# Patient Record
Sex: Female | Born: 1945 | Race: White | Hispanic: No | Smoking: Former smoker
Health system: Southern US, Community
[De-identification: ages and names within clinical notes are randomized; demographics above are authoritative.]

## PROBLEM LIST (undated history)

## (undated) DIAGNOSIS — F329 Major depressive disorder, single episode, unspecified: Secondary | ICD-10-CM

## (undated) DIAGNOSIS — I6523 Occlusion and stenosis of bilateral carotid arteries: Secondary | ICD-10-CM

## (undated) DIAGNOSIS — J189 Pneumonia, unspecified organism: Secondary | ICD-10-CM

## (undated) DIAGNOSIS — R0609 Other forms of dyspnea: Secondary | ICD-10-CM

## (undated) DIAGNOSIS — F32A Depression, unspecified: Secondary | ICD-10-CM

## (undated) DIAGNOSIS — E119 Type 2 diabetes mellitus without complications: Secondary | ICD-10-CM

## (undated) DIAGNOSIS — F419 Anxiety disorder, unspecified: Secondary | ICD-10-CM

## (undated) DIAGNOSIS — D509 Iron deficiency anemia, unspecified: Secondary | ICD-10-CM

## (undated) DIAGNOSIS — Z85828 Personal history of other malignant neoplasm of skin: Secondary | ICD-10-CM

## (undated) DIAGNOSIS — I10 Essential (primary) hypertension: Secondary | ICD-10-CM

## (undated) DIAGNOSIS — K219 Gastro-esophageal reflux disease without esophagitis: Secondary | ICD-10-CM

## (undated) DIAGNOSIS — M1990 Unspecified osteoarthritis, unspecified site: Secondary | ICD-10-CM

## (undated) DIAGNOSIS — D500 Iron deficiency anemia secondary to blood loss (chronic): Secondary | ICD-10-CM

## (undated) DIAGNOSIS — E785 Hyperlipidemia, unspecified: Secondary | ICD-10-CM

## (undated) DIAGNOSIS — R197 Diarrhea, unspecified: Secondary | ICD-10-CM

## (undated) DIAGNOSIS — M6530 Trigger finger, unspecified finger: Secondary | ICD-10-CM

## (undated) DIAGNOSIS — Z9889 Other specified postprocedural states: Secondary | ICD-10-CM

## (undated) DIAGNOSIS — R3915 Urgency of urination: Secondary | ICD-10-CM

## (undated) DIAGNOSIS — R0600 Dyspnea, unspecified: Secondary | ICD-10-CM

## (undated) DIAGNOSIS — I739 Peripheral vascular disease, unspecified: Secondary | ICD-10-CM

## (undated) DIAGNOSIS — R51 Headache: Secondary | ICD-10-CM

## (undated) DIAGNOSIS — R112 Nausea with vomiting, unspecified: Secondary | ICD-10-CM

## (undated) DIAGNOSIS — R0989 Other specified symptoms and signs involving the circulatory and respiratory systems: Secondary | ICD-10-CM

## (undated) DIAGNOSIS — M8580 Other specified disorders of bone density and structure, unspecified site: Secondary | ICD-10-CM

## (undated) DIAGNOSIS — Z853 Personal history of malignant neoplasm of breast: Secondary | ICD-10-CM

## (undated) DIAGNOSIS — I6529 Occlusion and stenosis of unspecified carotid artery: Secondary | ICD-10-CM

## (undated) DIAGNOSIS — C50919 Malignant neoplasm of unspecified site of unspecified female breast: Secondary | ICD-10-CM

## (undated) DIAGNOSIS — N183 Chronic kidney disease, stage 3 unspecified: Secondary | ICD-10-CM

## (undated) DIAGNOSIS — C801 Malignant (primary) neoplasm, unspecified: Secondary | ICD-10-CM

## (undated) DIAGNOSIS — M7522 Bicipital tendinitis, left shoulder: Secondary | ICD-10-CM

## (undated) DIAGNOSIS — I2510 Atherosclerotic heart disease of native coronary artery without angina pectoris: Secondary | ICD-10-CM

## (undated) HISTORY — DX: Malignant (primary) neoplasm, unspecified: C80.1

## (undated) HISTORY — PX: COLONOSCOPY: SHX174

## (undated) HISTORY — DX: Trigger finger, unspecified finger: M65.30

## (undated) HISTORY — PX: TUBAL LIGATION: SHX77

## (undated) HISTORY — DX: Other specified disorders of bone density and structure, unspecified site: M85.80

## (undated) HISTORY — PX: BREAST EXCISIONAL BIOPSY: SUR124

## (undated) HISTORY — DX: Chronic kidney disease, stage 3 unspecified: N18.30

## (undated) HISTORY — DX: Malignant neoplasm of unspecified site of unspecified female breast: C50.919

## (undated) HISTORY — DX: Type 2 diabetes mellitus without complications: E11.9

## (undated) HISTORY — DX: Iron deficiency anemia secondary to blood loss (chronic): D50.0

## (undated) HISTORY — DX: Hyperlipidemia, unspecified: E78.5

## (undated) HISTORY — DX: Occlusion and stenosis of unspecified carotid artery: I65.29

## (undated) HISTORY — DX: Other specified symptoms and signs involving the circulatory and respiratory systems: R09.89

## (undated) HISTORY — DX: Chronic kidney disease, stage 3 (moderate): N18.3

## (undated) HISTORY — PX: APPENDECTOMY: SHX54

## (undated) HISTORY — DX: Iron deficiency anemia, unspecified: D50.9

## (undated) HISTORY — PX: WISDOM TOOTH EXTRACTION: SHX21

## (undated) HISTORY — DX: Essential (primary) hypertension: I10

---

## 1980-07-20 DIAGNOSIS — Z85820 Personal history of malignant melanoma of skin: Secondary | ICD-10-CM

## 1980-07-20 HISTORY — DX: Personal history of malignant melanoma of skin: Z85.820

## 1980-07-31 DIAGNOSIS — C4372 Malignant melanoma of left lower limb, including hip: Secondary | ICD-10-CM | POA: Insufficient documentation

## 1980-12-18 HISTORY — PX: MELANOMA EXCISION: SHX5266

## 1981-05-20 HISTORY — PX: BLADDER SUSPENSION: SHX72

## 1981-05-20 HISTORY — PX: BLADDER SURGERY: SHX569

## 1983-07-21 HISTORY — PX: LAPAROSCOPIC CHOLECYSTECTOMY: SUR755

## 1993-07-20 HISTORY — PX: CHOLECYSTECTOMY: SHX55

## 1999-08-14 ENCOUNTER — Other Ambulatory Visit: Admission: RE | Admit: 1999-08-14 | Discharge: 1999-08-14 | Payer: Self-pay | Admitting: Obstetrics and Gynecology

## 1999-08-14 ENCOUNTER — Encounter (INDEPENDENT_AMBULATORY_CARE_PROVIDER_SITE_OTHER): Payer: Self-pay

## 2002-06-04 ENCOUNTER — Encounter: Payer: Self-pay | Admitting: Emergency Medicine

## 2002-06-04 ENCOUNTER — Inpatient Hospital Stay (HOSPITAL_COMMUNITY): Admission: EM | Admit: 2002-06-04 | Discharge: 2002-06-05 | Payer: Self-pay | Admitting: Emergency Medicine

## 2004-08-13 ENCOUNTER — Ambulatory Visit (HOSPITAL_COMMUNITY): Admission: RE | Admit: 2004-08-13 | Discharge: 2004-08-13 | Payer: Self-pay | Admitting: Gastroenterology

## 2008-05-28 ENCOUNTER — Encounter: Admission: RE | Admit: 2008-05-28 | Discharge: 2008-05-28 | Payer: Self-pay | Admitting: Gastroenterology

## 2010-07-20 HISTORY — PX: SQUAMOUS CELL CARCINOMA EXCISION: SHX2433

## 2010-12-05 NOTE — Discharge Summary (Signed)
   NAMESUNDUS, PETE NO.:  [id number]   MEDICAL RECORD NO.:  [id number]                   PATIENT TYPE:  INP   LOCATION:  2031                                 FACILITY:  [facility]   PHYSICIAN:  Lazaro Arms, M.D.        DATE OF BIRTH:  04-Oct-1945   DATE OF ADMISSION:  06/04/2002  DATE OF DISCHARGE:  06/05/2002                                 DISCHARGE SUMMARY   DISCHARGE DIAGNOSES:  1. Atypical chest pain.  2. Diet controlled diabetes.  3. Borderline hyperlipidemia.  4. Borderline hypertension.   HISTORY OF PRESENT ILLNESS:  Patient came to the hospital with some severe  chest pressure with a funny ache in her left arm.  She had no nausea or  diaphoresis.  The pain was not related to activity so she came to the  emergency room for evaluation.  The patient was admitted, placed on  telemetry, and ruled out for myocardial infarction.  She remained  asymptomatic throughout her hospital stay.  She was discharged home in  stable condition pain free on June 04, 2002.   PHYSICAL EXAMINATION:  Her blood pressure was 120/60.  Her pulse was 80.  O2  sat  was 98% on room air.  Temperature was 98.7.  The exam was within normal  limits.   DISCHARGE PLAN:  She is to followup next week for a basic metabolic profile  and a blood pressure check at Dr. Norvel Richards office.  We will set up an  outpatient stress test.   DISCHARGE MEDICATIONS:  Aspirin 325 mg p.o. daily.   She was also told to follow up immediately if she had any recurrence of the  chest pain.                                               Lazaro Arms, M.D.    AMC/MEDQ  D:  07/30/2002  T:  07/30/2002  Job:  [id number]   cc:   Rosanne Gutting  [address]  [place]  [place] [address]  Fax: [phone]

## 2010-12-05 NOTE — Op Note (Signed)
Faith Gaines, TETRO NO.:  [id number]   MEDICAL RECORD NO.:  [id number]          PATIENT TYPE:  AMB   LOCATION:  ENDO                         FACILITY:  [facility]   PHYSICIAN:  Graylin Shiver, M.D.   DATE OF BIRTH:  Mar 03, 1946   DATE OF PROCEDURE:  08/13/2004  DATE OF DISCHARGE:                                 OPERATIVE REPORT   PROCEDURE PERFORMED:  Colonoscopy.   INDICATIONS:  Rectal bleeding.   Informed consent was obtained after explanation of the risks of bleeding,  infection and perforation.   PREMEDICATION:  Fentanyl 75 mcg IV, Versed 5 milligrams IV.   PROCEDURE:  With the patient in the left lateral decubitus position. A  rectal exam was performed. No masses were felt. No external lesions were  seen. The Olympus colonoscope was inserted into the rectum and advanced  around the colon to the cecum. Cecal landmarks were identified. The cecum  and ascending colon were normal. The transverse colon normal. The descending  colon, sigmoid and rectum were normal. The scope was retroflexed in the  rectum. No abnormalities were seen. The scope was straightened and brought  out. She tolerated the procedure well without complications.   IMPRESSION:  Normal colonoscopy to the cecum.      SFG/MEDQ  D:  08/13/2004  T:  08/13/2004  Job:  [id number]   cc:   Molly Maduro A. Nicholos Johns, M.D.  [address]  [place]  [place] [address]  Fax: [phone]

## 2013-05-23 ENCOUNTER — Other Ambulatory Visit: Payer: Self-pay | Admitting: Obstetrics and Gynecology

## 2013-05-23 DIAGNOSIS — R922 Inconclusive mammogram: Secondary | ICD-10-CM

## 2013-06-02 ENCOUNTER — Ambulatory Visit
Admission: RE | Admit: 2013-06-02 | Discharge: 2013-06-02 | Disposition: A | Discharge status: Home / Self Care | Payer: Medicare Other | Source: Ambulatory Visit | Attending: Obstetrics and Gynecology | Admitting: Obstetrics and Gynecology

## 2013-06-02 DIAGNOSIS — N63 Unspecified lump in unspecified breast: Secondary | ICD-10-CM | POA: Diagnosis not present

## 2013-06-02 DIAGNOSIS — R922 Inconclusive mammogram: Secondary | ICD-10-CM

## 2013-06-02 DIAGNOSIS — R9230 Dense breasts, unspecified: Secondary | ICD-10-CM

## 2013-06-02 MED ORDER — GADOBENATE DIMEGLUMINE 529 MG/ML IV SOLN
10.0000 mL | Freq: Once | INTRAVENOUS | Status: AC | PRN
  Administered 2013-06-02: 10 mL via INTRAVENOUS

## 2013-06-05 ENCOUNTER — Other Ambulatory Visit: Payer: Self-pay | Admitting: Obstetrics and Gynecology

## 2013-06-05 ENCOUNTER — Other Ambulatory Visit: Payer: Self-pay | Admitting: Family Medicine

## 2013-06-05 DIAGNOSIS — Z23 Encounter for immunization: Secondary | ICD-10-CM | POA: Diagnosis not present

## 2013-06-05 DIAGNOSIS — Z0000 Encounter for general adult medical examination without abnormal findings: Secondary | ICD-10-CM | POA: Diagnosis not present

## 2013-06-05 DIAGNOSIS — R197 Diarrhea, unspecified: Secondary | ICD-10-CM | POA: Diagnosis not present

## 2013-06-05 DIAGNOSIS — I10 Essential (primary) hypertension: Secondary | ICD-10-CM | POA: Diagnosis not present

## 2013-06-05 DIAGNOSIS — N183 Chronic kidney disease, stage 3 unspecified: Secondary | ICD-10-CM | POA: Diagnosis not present

## 2013-06-05 DIAGNOSIS — E785 Hyperlipidemia, unspecified: Secondary | ICD-10-CM | POA: Diagnosis not present

## 2013-06-05 DIAGNOSIS — R9389 Abnormal findings on diagnostic imaging of other specified body structures: Secondary | ICD-10-CM

## 2013-06-05 DIAGNOSIS — R0989 Other specified symptoms and signs involving the circulatory and respiratory systems: Secondary | ICD-10-CM

## 2013-06-05 DIAGNOSIS — E1129 Type 2 diabetes mellitus with other diabetic kidney complication: Secondary | ICD-10-CM | POA: Diagnosis not present

## 2013-06-06 ENCOUNTER — Other Ambulatory Visit: Payer: Self-pay | Admitting: Obstetrics and Gynecology

## 2013-06-06 ENCOUNTER — Ambulatory Visit
Admission: RE | Admit: 2013-06-06 | Discharge: 2013-06-06 | Disposition: A | Discharge status: Home / Self Care | Payer: Medicare Other | Source: Ambulatory Visit | Attending: Obstetrics and Gynecology | Admitting: Obstetrics and Gynecology

## 2013-06-06 DIAGNOSIS — N63 Unspecified lump in unspecified breast: Secondary | ICD-10-CM | POA: Diagnosis not present

## 2013-06-06 DIAGNOSIS — R9389 Abnormal findings on diagnostic imaging of other specified body structures: Secondary | ICD-10-CM

## 2013-06-06 DIAGNOSIS — R1031 Right lower quadrant pain: Secondary | ICD-10-CM | POA: Diagnosis not present

## 2013-06-07 ENCOUNTER — Ambulatory Visit
Admission: RE | Admit: 2013-06-07 | Discharge: 2013-06-07 | Disposition: A | Discharge status: Home / Self Care | Payer: Medicare Other | Source: Ambulatory Visit | Attending: Family Medicine | Admitting: Family Medicine

## 2013-06-07 DIAGNOSIS — R0989 Other specified symptoms and signs involving the circulatory and respiratory systems: Secondary | ICD-10-CM

## 2013-06-07 DIAGNOSIS — I658 Occlusion and stenosis of other precerebral arteries: Secondary | ICD-10-CM | POA: Diagnosis not present

## 2013-06-13 DIAGNOSIS — R141 Gas pain: Secondary | ICD-10-CM | POA: Diagnosis not present

## 2013-06-13 DIAGNOSIS — R197 Diarrhea, unspecified: Secondary | ICD-10-CM | POA: Diagnosis not present

## 2013-06-13 DIAGNOSIS — K625 Hemorrhage of anus and rectum: Secondary | ICD-10-CM | POA: Diagnosis not present

## 2013-06-13 DIAGNOSIS — R1031 Right lower quadrant pain: Secondary | ICD-10-CM | POA: Diagnosis not present

## 2013-06-14 ENCOUNTER — Ambulatory Visit
Admission: RE | Admit: 2013-06-14 | Discharge: 2013-06-14 | Disposition: A | Discharge status: Home / Self Care | Payer: Medicare Other | Source: Ambulatory Visit | Attending: Obstetrics and Gynecology | Admitting: Obstetrics and Gynecology

## 2013-06-14 DIAGNOSIS — N6089 Other benign mammary dysplasias of unspecified breast: Secondary | ICD-10-CM | POA: Diagnosis not present

## 2013-06-14 DIAGNOSIS — N63 Unspecified lump in unspecified breast: Secondary | ICD-10-CM

## 2013-06-14 DIAGNOSIS — D0590 Unspecified type of carcinoma in situ of unspecified breast: Secondary | ICD-10-CM | POA: Diagnosis not present

## 2013-06-14 DIAGNOSIS — R1031 Right lower quadrant pain: Secondary | ICD-10-CM | POA: Diagnosis not present

## 2013-06-14 DIAGNOSIS — R197 Diarrhea, unspecified: Secondary | ICD-10-CM | POA: Diagnosis not present

## 2013-06-14 DIAGNOSIS — R9389 Abnormal findings on diagnostic imaging of other specified body structures: Secondary | ICD-10-CM

## 2013-06-14 DIAGNOSIS — N6019 Diffuse cystic mastopathy of unspecified breast: Secondary | ICD-10-CM | POA: Diagnosis not present

## 2013-06-14 DIAGNOSIS — K625 Hemorrhage of anus and rectum: Secondary | ICD-10-CM | POA: Diagnosis not present

## 2013-06-14 DIAGNOSIS — C50919 Malignant neoplasm of unspecified site of unspecified female breast: Secondary | ICD-10-CM | POA: Diagnosis not present

## 2013-06-14 DIAGNOSIS — R141 Gas pain: Secondary | ICD-10-CM | POA: Diagnosis not present

## 2013-06-14 MED ORDER — GADOBENATE DIMEGLUMINE 529 MG/ML IV SOLN
10.0000 mL | Freq: Once | INTRAVENOUS | Status: AC | PRN
  Administered 2013-06-14: 10 mL via INTRAVENOUS

## 2013-06-20 ENCOUNTER — Telehealth: Payer: Self-pay | Admitting: *Deleted

## 2013-06-20 NOTE — Telephone Encounter (Signed)
Pt has decided to have Dr. Donell Beers perform her surgery and bypass BMDC on 06/28/13.  She would like the remainder of her treatment to be performed in [place].  I have conveyed this to Dr. Donell Beers and her nurse.  Gave pt my contact information if she has needs.

## 2013-06-22 ENCOUNTER — Other Ambulatory Visit: Payer: Self-pay | Admitting: Vascular Surgery

## 2013-06-23 ENCOUNTER — Ambulatory Visit (INDEPENDENT_AMBULATORY_CARE_PROVIDER_SITE_OTHER): Payer: Medicare Other | Admitting: General Surgery

## 2013-06-23 ENCOUNTER — Encounter (INDEPENDENT_AMBULATORY_CARE_PROVIDER_SITE_OTHER): Payer: Self-pay

## 2013-06-23 ENCOUNTER — Other Ambulatory Visit (INDEPENDENT_AMBULATORY_CARE_PROVIDER_SITE_OTHER): Payer: Self-pay | Admitting: General Surgery

## 2013-06-23 ENCOUNTER — Encounter (INDEPENDENT_AMBULATORY_CARE_PROVIDER_SITE_OTHER): Payer: Self-pay | Admitting: General Surgery

## 2013-06-23 VITALS — BP 118/78 | HR 64 | Temp 98.6°F | Resp 14 | Ht 61.0 in | Wt 125.4 lb

## 2013-06-23 DIAGNOSIS — C50419 Malignant neoplasm of upper-outer quadrant of unspecified female breast: Secondary | ICD-10-CM | POA: Diagnosis not present

## 2013-06-23 DIAGNOSIS — Z85820 Personal history of malignant melanoma of skin: Secondary | ICD-10-CM

## 2013-06-23 DIAGNOSIS — C50412 Malignant neoplasm of upper-outer quadrant of left female breast: Secondary | ICD-10-CM | POA: Insufficient documentation

## 2013-06-23 DIAGNOSIS — C50912 Malignant neoplasm of unspecified site of left female breast: Secondary | ICD-10-CM

## 2013-06-23 NOTE — Patient Instructions (Signed)
I will send my note to Dr. Hart Rochester.    Once I hear from him, we will determine timing of surgery.    At this point, continue your fish oil and aspirin until otherwise directed.   IF YOU ARE TAKING ASPIRIN, COUMADIN/WARFARIN, PLAVIX, OR OTHER BLOOD THINNER, PLEASE LET us KNOW IMMEDIATELY.  WE WILL NEED TO DISCUSS WITH THE PRESCRIBING PROVIDER IF THESE ARE SAFE TO STOP. IF THESE ARE NOT STOPPED AT THE APPROPRIATE TIME, THIS WILL RESULT IN A DELAY FOR YOUR SURGERY.  DO NOT TAKE THESE MEDICATIONS OR IBUPROFEN/NAPROXEN WITHIN A WEEK BEFORE SURGERY.  You will need to get a wire placed in radiology prior to surgery.  We will schedule that at time of surgery.    The main risks of surgery are bleeding, infection, damage to other structures, and seroma (accumulation of fluid) under the incision site(s).    These complications may lead to additional procedures such as drainage of seroma/infection.  If invasive cancer is found, you may need other surgeries to obtain negative margins or to take more lymph nodes.   Most women do accumulate fluid in the breast cavity where the specimen was removed. We do not always have to drain this fluid.  If your breast is very tense, painful, or red, then we may need to numb the skin and use a needle to aspirate the fluid.  We do provide patients with a Breast Binder.  The purpose of this is to avoid the use of tape on the sensitive tissue of the breast and to provide some compression to minimize the risk of seroma.  If the binder is uncomfortable, you may find that a tank top with a built-in shelf bra or a loose sports bra works better for you.  I recommend wearing this around the clock for the first 1-2 weeks except in the shower.    You may remove your dressings and may shower 48 hours after surgery.   Many patients have some constipation in the week after surgery due to the narcotics and anesthesia.  You may need over the counter stool softeners or laxatives if you  experience difficulty having bowel movements.    If the following occur, call our office at [phone]: If you have a fever over 101 or pain that is severe despite narcotics. If you have redness or drainage at the wound. If you develop persistent nausea or vomiting.  I will follow you back up in 1-4 weeks.    Please submit any paperwork about time off work/insurance forms to the front desk.

## 2013-06-23 NOTE — Assessment & Plan Note (Signed)
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 She is a good candidate for breast conservation.  She will need a left needle localized partial mastectomy. She just recently underwent a carotid duplex and was found to have right-sided carotid stenosis. She has an appointment with Dr. Hart Rochester of vascular surgery on Tuesday to discuss this. She was instructed by the person reading her vascular ultrasound that she should have that dealt with first.  I certainly would want to know Dr. Candie Chroman opinion prior to inserting her to hold her fish oil and her aspirin. If she indeed has critical carotid stenosis that needs to be addressed surgically, I would agree with undergoing carotid treatment prior to breast cancer therapy.  I would not have her undergo a lumpectomy and carotid endarterectomy simultaneously due to the administration of blood thinners intraoperatively for carotid surgery.  That would place her at higher risk of bleeding from the left breast.  Once I understand his plan, we will schedule her breast surgery.  The surgical procedure was described to the patient.  I discussed the incision type and location and whether we would need radiology involved on the day of surgery with a wire marker.      The risks and benefits of the procedure were described to the patient and he/she wishes to proceed.    We discussed the risks bleeding, infection, damage to other structures, need for further procedures/surgeries.  We discussed the risk of seroma.  The patient was advised if the area in the breast contains invasive cancer, we may need to go back to surgery for additional tissue to obtain negative margins or for a lymph node biopsy. The patient was advised that these are the most common complications, but that others can occur as well.  They were advised against taking aspirin or other anti-inflammatory agents/blood thinners the week before surgery.

## 2013-06-23 NOTE — Progress Notes (Signed)
Chief Complaint  Patient presents with  . New Evaluation    eval new br cancer    HISTORY: Patient is a 67 year old female who presents with a new diagnosis of left breast DCIS. She had her normal screening mammograms and received a letter advising her that she had very dense breasts. She subsequently underwent MRI and was found to have a 5 mm area of concern in her right breast and a 7 mm area of concern in her left breast. She subsequently underwent ultrasound without being able to successfully biopsy the mass. 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 She has not palpated any masses. She denies any nipple discharge or nipple retraction. She has not had any breast pain other than after the right-sided biopsy with hematoma.  She does have a significant family history of her mother having bilateral breast cancer as well as uterine cancer. The patient has a personal history of melanoma. She has required multiple pre-melanoma excisions from her dermatologist.  Of note, she was recently diagnosed with carotid stenosis on the right. I do not have these results available. However, she states that she has been told she had a 75-99% stenosis on the right.  She and does not have dizziness and has not had TIAs. Her mother did have a stroke and had a heart attack on the table during her second mastectomy. She does not have any dysfunctional uterine bleeding. She is a former smoker.  Past Medical History  Diagnosis Date  . Cancer     breast  . Diabetes mellitus without complication   . Hyperlipidemia   . Hypertension   . Chronic kidney disease     Past Surgical History  Procedure Laterality Date  . Melanoma excision  12/1980    left leg  . Bladder surgery  05/1981    bladder tack  . Cholecystectomy  1995  . Squamous cell carcinoma excision  2012    Current Outpatient Prescriptions  Medication Sig Dispense Refill  . Canagliflozin (INVOKANA)  300 MG TABS Take 300 mg by mouth.      . Fish Oil-Cholecalciferol (FISH OIL + D3) 1000-1000 MG-UNIT CAPS Take by mouth.      . nebivolol (BYSTOLIC) 10 MG tablet Take 10 mg by mouth daily.      . nebivolol (BYSTOLIC) 5 MG tablet Take 5 mg by mouth daily.      . pantoprazole (PROTONIX) 40 MG tablet Take 40 mg by mouth daily.      . Saxagliptin-Metformin (KOMBIGLYZE XR) 2.11-998 MG TB24 Take by mouth.       No current facility-administered medications for this visit.     Allergies  Allergen Reactions  . Codeine Nausea And Vomiting and Other (See Comments)    Headache and vomiting     Family History  Problem Relation Age of Onset  . Cancer Mother     breast, uterus, lung  . Heart disease Father      History   Social History  . Marital Status: Widowed    Spouse Name: N/A    Number of Children: N/A  . Years of Education: N/A   Social History Main Topics  . Smoking status: Former Smoker    Quit date: 07/20/2010  . Smokeless tobacco: Never Used  . Alcohol Use: No  . Drug Use: No  . Sexual Activity: None   REVIEW OF SYSTEMS - PERTINENT POSITIVES ONLY: 12 point review of systems negative other than  HPI and PMH except for diarrhea, weakness  EXAM: Filed Vitals:   06/23/13 1018  BP: 118/78  Pulse: 64  Temp: 98.6 F (37 C)  Resp: 14   Filed Weights   06/23/13 1018  Weight: 125 lb 6.4 oz (56.881 kg)    Gen:  No acute distress.  Well nourished and well groomed.   Neurological: Alert and oriented to person, place, and time. Coordination normal.  Head: Normocephalic and atraumatic.  Eyes: Conjunctivae are normal. Pupils are equal, round, and reactive to light. No scleral icterus.  Neck: Normal range of motion. Neck supple. No tracheal deviation or thyromegaly present.  Cardiovascular: Normal rate, regular rhythm, normal heart sounds and intact distal pulses.  Exam reveals no gallop and no friction rub.  No murmur heard. Respiratory: Effort normal.  No respiratory  distress. No chest wall tenderness. Breath sounds normal.  No wheezes, rales or rhonchi.  Breast:  Right breast with hematoma at 9:30.  Left breast soft.  No palpable masses or skin dimpling.  No nipple retraction.  No nipple discharge.  No LAD.   GI: Soft. Bowel sounds are normal. The abdomen is soft and nontender.  There is no rebound and no guarding.  Musculoskeletal: Normal range of motion. Extremities are nontender.  Lymphadenopathy: No cervical, preauricular, postauricular or axillary adenopathy is present Skin: Skin is warm and dry. No rash noted. No diaphoresis. No erythema. No pallor. No clubbing, cyanosis, or edema.   Psychiatric: Normal mood and affect. Behavior is normal. Judgment and thought content normal.    LABORATORY RESULTS: Available labs are reviewed  Pathology Diagnosis 1. Breast, right, needle core biopsy, upper outer quadrant - FIBROCYSTIC CHANGES WITH USUAL DUCTAL HYPERPLASIA. - BENIGN DUCTS WITH ASSOCIATED MICROCALCIFICATION. - NO ATYPIA OR MALIGNANCY IDENTIFIED. 2. Breast, left, needle core biopsy, mass, outer central - DUCTAL CARCINOMA IN SITU. - SEE COMMENT.  RADIOLOGY RESULTS: See E-Chart or I-Site for most recent results.  Images and reports are reviewed. MRI IMPRESSION:  1. An indeterminate 7 mm mass at 3 o'clock in the left breast.  2. A 5 mm dominant focus in the upper-outer right breast.    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 She is a good candidate for breast conservation.  She will need a left needle localized partial mastectomy. She just recently underwent a carotid duplex and was found to have right-sided carotid stenosis. She has an appointment with Dr. Hart Rochester of vascular surgery on Tuesday to discuss this. She was instructed by the person reading her vascular ultrasound that she should have that dealt with  first.  I certainly would want to know Dr. Candie Chroman opinion prior to inserting her to hold her fish oil and her aspirin. If she indeed has critical carotid stenosis that needs to be addressed surgically, I would agree with undergoing carotid treatment prior to breast cancer therapy.  I would not have her undergo a lumpectomy and carotid endarterectomy simultaneously due to the administration of blood thinners intraoperatively for carotid surgery.  That would place her at higher risk of bleeding from the left breast.  Once I understand his plan, we will schedule her breast surgery.  The surgical procedure was described to the patient.  I discussed the incision type and location and whether we would need radiology involved on the day of surgery with a wire marker.      The risks  and benefits of the procedure were described to the patient and he/she wishes to proceed.    We discussed the risks bleeding, infection, damage to other structures, need for further procedures/surgeries.  We discussed the risk of seroma.  The patient was advised if the area in the breast contains invasive cancer, we may need to go back to surgery for additional tissue to obtain negative margins or for a lymph node biopsy. The patient was advised that these are the most common complications, but that others can occur as well.  They were advised against taking aspirin or other anti-inflammatory agents/blood thinners the week before surgery.        Maudry Diego MD Surgical Oncology, General and Endocrine Surgery [facility]      Visit Diagnoses: 1. Breast cancer of upper-outer quadrant of left female breast, Tis, 3 oclock     Primary Care Physician: Lolita Patella, MD  [facility].

## 2013-06-26 ENCOUNTER — Encounter: Payer: Self-pay | Admitting: Vascular Surgery

## 2013-06-27 ENCOUNTER — Encounter: Payer: Self-pay | Admitting: *Deleted

## 2013-06-27 ENCOUNTER — Other Ambulatory Visit: Payer: Self-pay

## 2013-06-27 ENCOUNTER — Ambulatory Visit (INDEPENDENT_AMBULATORY_CARE_PROVIDER_SITE_OTHER)
Admission: RE | Admit: 2013-06-27 | Discharge: 2013-06-27 | Disposition: A | Discharge status: Home / Self Care | Payer: Medicare Other | Source: Ambulatory Visit | Attending: Vascular Surgery | Admitting: Vascular Surgery

## 2013-06-27 ENCOUNTER — Encounter: Payer: Self-pay | Admitting: Vascular Surgery

## 2013-06-27 ENCOUNTER — Ambulatory Visit (INDEPENDENT_AMBULATORY_CARE_PROVIDER_SITE_OTHER): Payer: Medicare Other | Admitting: Vascular Surgery

## 2013-06-27 DIAGNOSIS — I6529 Occlusion and stenosis of unspecified carotid artery: Secondary | ICD-10-CM | POA: Insufficient documentation

## 2013-06-27 NOTE — Progress Notes (Signed)
Subjective:     Patient ID: Faith Gaines, female   DOB: 11/15/1945, 67 y.o.   MRN: [id number]  HPI this 67-year-old female is referred for severe right ICA stenosis. She was found to have a carotid bruit and carotid duplex exam was performed which revealed a greater than 80% right ICA stenosis. Her physician Dr. Robert Reade referred her for evaluation. She has no history of stroke, lateralizing weakness, aphasia, amaurosis fugax, diplopia, blurred vision, or syncope. She does have a breast issue which will require surgery in the near future.  Past Medical History  Diagnosis Date  . Cancer     breast  . Diabetes mellitus without complication   . Hyperlipidemia   . Hypertension   . Chronic kidney disease   . Carotid bruit   . Trigger finger   . Osteopenia     History  Substance Use Topics  . Smoking status: Former Smoker    Quit date: 07/20/2010  . Smokeless tobacco: Never Used  . Alcohol Use: No    Family History  Problem Relation Age of Onset  . Cancer Mother     breast, uterus, lung  . Heart attack Mother   . Stroke Mother   . Heart disease Mother   . Hypertension Mother   . Varicose Veins Mother   . Heart disease Father   . Heart attack Father   . Kidney disease Father   . Hypertension Father   . Diabetes Father   . Cancer Father   . Heart attack Sister   . Heart disease Sister     Allergies  Allergen Reactions  . Codeine Nausea And Vomiting and Other (See Comments)    Headache and vomiting  . Ace Inhibitors     Drop in GFR  . Clarithromycin     Unable to sleep   . Crestor [Rosuvastatin]     Body aches  . Lipitor [Atorvastatin]     Body aches  . Avandamet [Rosiglitazone-Metformin] Rash    Current outpatient prescriptions:acetaminophen (TYLENOL) 500 MG tablet, Take 500 mg by mouth every 6 (six) hours as needed., Disp: , Rfl: ;  aspirin 81 MG tablet, Take 81 mg by mouth daily., Disp: , Rfl: ;  Canagliflozin (INVOKANA) 300 MG TABS, Take 300 mg by  mouth., Disp: , Rfl: ;  Fish Oil-Cholecalciferol (FISH OIL + D3) 1000-1000 MG-UNIT CAPS, Take by mouth., Disp: , Rfl:  hydrocortisone (ANUSOL-HC) 25 MG suppository, Place 25 mg rectally 2 (two) times daily., Disp: , Rfl: ;  nebivolol (BYSTOLIC) 10 MG tablet, Take 10 mg by mouth daily., Disp: , Rfl: ;  nebivolol (BYSTOLIC) 5 MG tablet, Take 5 mg by mouth daily., Disp: , Rfl: ;  pantoprazole (PROTONIX) 40 MG tablet, Take 40 mg by mouth daily., Disp: , Rfl: ;  Saxagliptin-Metformin (KOMBIGLYZE XR) 2.11-998 MG TB24, Take by mouth., Disp: , Rfl:  amLODipine (NORVASC) 5 MG tablet, Take 5 mg by mouth daily., Disp: , Rfl:   BP 165/57  Pulse 69  Ht 5' 1" (1.549 m)  Wt 126 lb 9.6 oz (57.425 kg)  BMI 23.93 kg/m2  SpO2 100%  Body mass index is 23.93 kg/(m^2).          Review of Systems denies chest pain, dyspnea on exertion, PND, orthopnea, claudication, hemoptysis. Does complain of generalized weakness in her lower extremities washed out feeling on occasion. All other systems negative complete review of systems    Objective:   Physical Exam BP 165/57  Pulse 69    Ht 5' 1" (1.549 m)  Wt 126 lb 9.6 oz (57.425 kg)  BMI 23.93 kg/m2  SpO2 100%  Gen.-alert and oriented x3 in no apparent distress HEENT normal for age Lungs no rhonchi or wheezing Cardiovascular regular rhythm no murmurs carotid pulses 3+ palpable-harsh bruit over right carotid bifurcation-no bruit on the left  Abdomen soft nontender no palpable masses Musculoskeletal free of  major deformities Skin clear -no rashes Neurologic normal Lower extremities 3+ femoral and dorsalis pedis pulses palpable bilaterally with no edema  Today I ordered a carotid duplex exam of the right side only to compare to the outside exam. We are in agreement that she does have an approximate 80-85% right ICA stenosis with mild/moderate disease on the contralateral left side by the outside study.        Assessment:     80% right ICA  stenosis-asymptomatic    Plan:     Plan right carotid endarterectomy with Dacron patch angioplasty on Thursday, December 11-risks and benefits thoroughly discussed with patient and her 3 children and all are in agreement to proceed      

## 2013-06-28 ENCOUNTER — Encounter (HOSPITAL_COMMUNITY): Payer: Self-pay | Admitting: Pharmacy Technician

## 2013-06-28 ENCOUNTER — Other Ambulatory Visit (HOSPITAL_COMMUNITY): Payer: Self-pay | Admitting: *Deleted

## 2013-06-28 ENCOUNTER — Encounter (HOSPITAL_COMMUNITY)
Admission: RE | Admit: 2013-06-28 | Discharge: 2013-06-28 | Disposition: A | Discharge status: Home / Self Care | Payer: Medicare Other | Source: Ambulatory Visit | Attending: Anesthesiology | Admitting: Anesthesiology

## 2013-06-28 ENCOUNTER — Encounter (HOSPITAL_COMMUNITY)
Admission: RE | Admit: 2013-06-28 | Discharge: 2013-06-28 | Disposition: A | Discharge status: Home / Self Care | Payer: Medicare Other | Source: Ambulatory Visit | Attending: Vascular Surgery | Admitting: Vascular Surgery

## 2013-06-28 ENCOUNTER — Encounter (HOSPITAL_COMMUNITY): Payer: Self-pay

## 2013-06-28 DIAGNOSIS — M899 Disorder of bone, unspecified: Secondary | ICD-10-CM | POA: Diagnosis present

## 2013-06-28 DIAGNOSIS — M6530 Trigger finger, unspecified finger: Secondary | ICD-10-CM | POA: Diagnosis present

## 2013-06-28 DIAGNOSIS — I129 Hypertensive chronic kidney disease with stage 1 through stage 4 chronic kidney disease, or unspecified chronic kidney disease: Secondary | ICD-10-CM | POA: Diagnosis present

## 2013-06-28 DIAGNOSIS — Z87891 Personal history of nicotine dependence: Secondary | ICD-10-CM | POA: Diagnosis not present

## 2013-06-28 DIAGNOSIS — N189 Chronic kidney disease, unspecified: Secondary | ICD-10-CM | POA: Diagnosis not present

## 2013-06-28 DIAGNOSIS — E119 Type 2 diabetes mellitus without complications: Secondary | ICD-10-CM | POA: Diagnosis not present

## 2013-06-28 DIAGNOSIS — Z01818 Encounter for other preprocedural examination: Secondary | ICD-10-CM | POA: Diagnosis not present

## 2013-06-28 DIAGNOSIS — E785 Hyperlipidemia, unspecified: Secondary | ICD-10-CM | POA: Diagnosis not present

## 2013-06-28 DIAGNOSIS — J449 Chronic obstructive pulmonary disease, unspecified: Secondary | ICD-10-CM | POA: Diagnosis not present

## 2013-06-28 DIAGNOSIS — I6529 Occlusion and stenosis of unspecified carotid artery: Secondary | ICD-10-CM | POA: Diagnosis not present

## 2013-06-28 HISTORY — DX: Peripheral vascular disease, unspecified: I73.9

## 2013-06-28 HISTORY — DX: Gastro-esophageal reflux disease without esophagitis: K21.9

## 2013-06-28 HISTORY — DX: Pneumonia, unspecified organism: J18.9

## 2013-06-28 LAB — CBC
HCT: 40.5 % (ref 36.0–46.0)
Hemoglobin: 13 g/dL (ref 12.0–15.0)
MCH: 25.8 pg — ABNORMAL LOW (ref 26.0–34.0)
MCHC: 32.1 g/dL (ref 30.0–36.0)
MCV: 80.4 fL (ref 78.0–100.0)
Platelets: 296 10*3/uL (ref 150–400)
RBC: 5.04 MIL/uL (ref 3.87–5.11)
RDW: 14.3 % (ref 11.5–15.5)
WBC: 10.2 10*3/uL (ref 4.0–10.5)

## 2013-06-28 LAB — COMPREHENSIVE METABOLIC PANEL
ALT: 17 U/L (ref 0–35)
AST: 17 U/L (ref 0–37)
Albumin: 4.1 g/dL (ref 3.5–5.2)
Alkaline Phosphatase: 95 U/L (ref 39–117)
BUN: 17 mg/dL (ref 6–23)
CO2: 24 mEq/L (ref 19–32)
Calcium: 10.9 mg/dL — ABNORMAL HIGH (ref 8.4–10.5)
Chloride: 102 mEq/L (ref 96–112)
Creatinine, Ser: 1.06 mg/dL (ref 0.50–1.10)
GFR calc Af Amer: 62 mL/min — ABNORMAL LOW (ref >90)
GFR calc non Af Amer: 54 mL/min — ABNORMAL LOW (ref >90)
Glucose, Bld: 201 mg/dL — ABNORMAL HIGH (ref 70–99)
Potassium: 4.7 mEq/L (ref 3.5–5.1)
Sodium: 140 mEq/L (ref 135–145)
Total Bilirubin: 0.2 mg/dL — ABNORMAL LOW (ref 0.3–1.2)
Total Protein: 7.5 g/dL (ref 6.0–8.3)

## 2013-06-28 LAB — SURGICAL PCR SCREEN
MRSA, PCR: NEGATIVE
Staphylococcus aureus: NEGATIVE

## 2013-06-28 LAB — URINALYSIS, ROUTINE W REFLEX MICROSCOPIC
Bilirubin Urine: NEGATIVE
Glucose, UA: >1000 mg/dL — AB
Hgb urine dipstick: NEGATIVE
Ketones, ur: NEGATIVE mg/dL
Leukocytes, UA: NEGATIVE
Nitrite: NEGATIVE
Protein, ur: NEGATIVE mg/dL
Specific Gravity, Urine: 1.018 (ref 1.005–1.030)
Urobilinogen, UA: 0.2 mg/dL (ref 0.0–1.0)
pH: 5 (ref 5.0–8.0)

## 2013-06-28 LAB — PROTIME-INR
INR: 0.94 (ref 0.00–1.49)
Prothrombin Time: 12.4 seconds (ref 11.6–15.2)

## 2013-06-28 LAB — APTT: aPTT: 26 seconds (ref 24–37)

## 2013-06-28 LAB — URINE MICROSCOPIC-ADD ON

## 2013-06-28 LAB — PREPARE RBC (CROSSMATCH)

## 2013-06-28 LAB — ABO/RH: ABO/RH(D): O POS

## 2013-06-28 MED ORDER — DEXTROSE 5 % IV SOLN
1.5000 g | INTRAVENOUS | Status: AC
  Administered 2013-06-29: 1.5 g via INTRAVENOUS
  Filled 2013-06-28: qty 1.5

## 2013-06-28 NOTE — Progress Notes (Signed)
Anesthesia chart review:  Patient is a 67 year old female scheduled for right CEA tomorrow by Dr. Hart Rochester.  History noted and includes HTN, HLD, DM2.  Preoperative EKG, CXR, labs noted.  She will get a fasting CBG on arrival. If results are acceptable then I would anticipate that she could proceed as planned.  Velna Ochs [facility] Stay [facility]/Anesthesiology Phone [phone] 06/28/2013 4:41 PM

## 2013-06-28 NOTE — Pre-Procedure Instructions (Addendum)
[name]  06/28/2013   Your procedure is scheduled on:  06/29/13  Report to [facility] short stay admitting at 530 AM.  Call this number if you have problems the morning of surgery: [phone]   Remember:   Do not eat food or drink liquids after midnight.   Take these medicines the morning of surgery with A SIP OF WATER:bystolic,zegerid             STOP all herbel meds, nsaids (aleve,naproxen,advil,ibuprofen) including fish oil,     Aspirin per dr Hart Rochester   Do not wear jewelry, make-up or nail polish.  Do not wear lotions, powders, or perfumes. You may wear deodorant.  Do not shave 48 hours prior to surgery. Men may shave face and neck.  Do not bring valuables to the hospital.  [employer] is not responsible                  for any belongings or valuables.               Contacts, dentures or bridgework may not be worn into surgery.  Leave suitcase in the car. After surgery it may be brought to your room.  For patients admitted to the hospital, discharge time is determined by your                treatment team.               Patients discharged the day of surgery will not be allowed to drive  home.  Name and phone number of your driver:   Special Instructions: Shower using CHG 2 nights before surgery and the night before surgery.  If you shower the day of surgery use CHG.  Use special wash - you have one bottle of CHG for all showers.  You should use approximately 1/3 of the bottle for each shower.   Please read over the following fact sheets that you were given: Pain Booklet, Coughing and Deep Breathing, Blood Transfusion Information, MRSA Information and Surgical Site Infection Prevention

## 2013-06-29 ENCOUNTER — Telehealth: Payer: Self-pay | Admitting: Vascular Surgery

## 2013-06-29 ENCOUNTER — Inpatient Hospital Stay (HOSPITAL_COMMUNITY): Payer: Medicare Other | Admitting: Anesthesiology

## 2013-06-29 ENCOUNTER — Encounter (HOSPITAL_COMMUNITY): Payer: Self-pay | Admitting: *Deleted

## 2013-06-29 ENCOUNTER — Inpatient Hospital Stay (HOSPITAL_COMMUNITY)
Admission: RE | Admit: 2013-06-29 | Discharge: 2013-06-30 | DRG: 039 | Disposition: A | Discharge status: Home / Self Care | Payer: Medicare Other | Source: Ambulatory Visit | Attending: Vascular Surgery | Admitting: Vascular Surgery

## 2013-06-29 ENCOUNTER — Encounter: Payer: Self-pay | Admitting: Family Medicine

## 2013-06-29 ENCOUNTER — Encounter (HOSPITAL_COMMUNITY)
Admission: RE | Disposition: A | Discharge status: Home / Self Care | Payer: Self-pay | Source: Ambulatory Visit | Attending: Vascular Surgery

## 2013-06-29 ENCOUNTER — Encounter (HOSPITAL_COMMUNITY): Payer: Medicare Other | Admitting: Vascular Surgery

## 2013-06-29 DIAGNOSIS — N189 Chronic kidney disease, unspecified: Secondary | ICD-10-CM | POA: Diagnosis present

## 2013-06-29 DIAGNOSIS — E785 Hyperlipidemia, unspecified: Secondary | ICD-10-CM | POA: Diagnosis present

## 2013-06-29 DIAGNOSIS — M6530 Trigger finger, unspecified finger: Secondary | ICD-10-CM | POA: Diagnosis present

## 2013-06-29 DIAGNOSIS — Z87891 Personal history of nicotine dependence: Secondary | ICD-10-CM

## 2013-06-29 DIAGNOSIS — I6529 Occlusion and stenosis of unspecified carotid artery: Principal | ICD-10-CM | POA: Diagnosis present

## 2013-06-29 DIAGNOSIS — I129 Hypertensive chronic kidney disease with stage 1 through stage 4 chronic kidney disease, or unspecified chronic kidney disease: Secondary | ICD-10-CM | POA: Diagnosis present

## 2013-06-29 DIAGNOSIS — E119 Type 2 diabetes mellitus without complications: Secondary | ICD-10-CM | POA: Diagnosis not present

## 2013-06-29 DIAGNOSIS — I779 Disorder of arteries and arterioles, unspecified: Secondary | ICD-10-CM | POA: Diagnosis present

## 2013-06-29 DIAGNOSIS — M899 Disorder of bone, unspecified: Secondary | ICD-10-CM | POA: Diagnosis present

## 2013-06-29 HISTORY — PX: ENDARTERECTOMY: SHX5162

## 2013-06-29 LAB — GLUCOSE, CAPILLARY
Glucose-Capillary: 148 mg/dL — ABNORMAL HIGH (ref 70–99)
Glucose-Capillary: 122 mg/dL — ABNORMAL HIGH (ref 70–99)
Glucose-Capillary: 159 mg/dL — ABNORMAL HIGH (ref 70–99)
Glucose-Capillary: 145 mg/dL — ABNORMAL HIGH (ref 70–99)

## 2013-06-29 SURGERY — ENDARTERECTOMY, CAROTID
Anesthesia: General | Site: Neck | Laterality: Right

## 2013-06-29 MED ORDER — PHENOL 1.4 % MT LIQD: 1.0000 | OROMUCOSAL | Status: DC | PRN

## 2013-06-29 MED ORDER — PROTAMINE SULFATE 10 MG/ML IV SOLN
INTRAVENOUS | Status: DC | PRN
  Administered 2013-06-29: 20 mg via INTRAVENOUS
  Administered 2013-06-29 (×3): 10 mg via INTRAVENOUS

## 2013-06-29 MED ORDER — ALUM & MAG HYDROXIDE-SIMETH 200-200-20 MG/5ML PO SUSP: 15.0000 mL | ORAL | Status: DC | PRN

## 2013-06-29 MED ORDER — ASPIRIN EC 81 MG PO TBEC
81.0000 mg | DELAYED_RELEASE_TABLET | Freq: Every day | ORAL | Status: DC
  Administered 2013-06-30: 81 mg via ORAL
  Filled 2013-06-29: qty 1

## 2013-06-29 MED ORDER — GUAIFENESIN-DM 100-10 MG/5ML PO SYRP: 15.0000 mL | ORAL_SOLUTION | ORAL | Status: DC | PRN

## 2013-06-29 MED ORDER — PANTOPRAZOLE SODIUM 40 MG PO TBEC
40.0000 mg | DELAYED_RELEASE_TABLET | Freq: Every day | ORAL | Status: DC
  Administered 2013-06-29 – 2013-06-30 (×2): 40 mg via ORAL
  Filled 2013-06-29 (×2): qty 1

## 2013-06-29 MED ORDER — OMEGA 3 1200 MG PO CAPS
1.0000 | ORAL_CAPSULE | Freq: Every day | ORAL | Status: DC
Start: 2013-06-29 — End: 2013-06-29

## 2013-06-29 MED ORDER — MIDAZOLAM HCL 5 MG/5ML IJ SOLN
INTRAMUSCULAR | Status: DC | PRN
  Administered 2013-06-29: 2 mg via INTRAVENOUS

## 2013-06-29 MED ORDER — HYDROMORPHONE HCL PF 1 MG/ML IJ SOLN
INTRAMUSCULAR | Status: AC
  Administered 2013-06-29: 1 mg
  Filled 2013-06-29: qty 1

## 2013-06-29 MED ORDER — SODIUM CHLORIDE 0.9 % IV SOLN
INTRAVENOUS | Status: DC
  Administered 2013-06-29: 100 mL/h via INTRAVENOUS
  Administered 2013-06-29: 23:00:00 via INTRAVENOUS

## 2013-06-29 MED ORDER — HYDROMORPHONE HCL PF 1 MG/ML IJ SOLN
0.2500 mg | INTRAMUSCULAR | Status: DC | PRN
  Administered 2013-06-29 (×2): 0.5 mg via INTRAVENOUS

## 2013-06-29 MED ORDER — LIDOCAINE HCL (PF) 1 % IJ SOLN
INTRAMUSCULAR | Status: AC
  Filled 2013-06-29: qty 30

## 2013-06-29 MED ORDER — CANAGLIFLOZIN 300 MG PO TABS: 300.0000 mg | ORAL_TABLET | ORAL | Status: AC

## 2013-06-29 MED ORDER — HEPARIN SODIUM (PORCINE) 1000 UNIT/ML IJ SOLN
INTRAMUSCULAR | Status: DC | PRN
  Administered 2013-06-29: 6000 [IU] via INTRAVENOUS

## 2013-06-29 MED ORDER — ACETAMINOPHEN 650 MG RE SUPP: 325.0000 mg | RECTAL | Status: DC | PRN

## 2013-06-29 MED ORDER — DOPAMINE-DEXTROSE 3.2-5 MG/ML-% IV SOLN: 3.0000 ug/kg/min | INTRAVENOUS | Status: DC

## 2013-06-29 MED ORDER — PROMETHAZINE HCL 25 MG/ML IJ SOLN: 6.2500 mg | INTRAMUSCULAR | Status: DC | PRN

## 2013-06-29 MED ORDER — HYDRALAZINE HCL 20 MG/ML IJ SOLN: 10.0000 mg | INTRAMUSCULAR | Status: DC | PRN

## 2013-06-29 MED ORDER — ASPIRIN 81 MG PO TABS: 81.0000 mg | ORAL_TABLET | Freq: Every day | ORAL | Status: DC

## 2013-06-29 MED ORDER — PHENYLEPHRINE HCL 10 MG/ML IJ SOLN
INTRAMUSCULAR | Status: DC | PRN
  Administered 2013-06-29: 120 ug via INTRAVENOUS

## 2013-06-29 MED ORDER — DOCUSATE SODIUM 100 MG PO CAPS
100.0000 mg | ORAL_CAPSULE | Freq: Every day | ORAL | Status: DC
  Administered 2013-06-30: 100 mg via ORAL
  Filled 2013-06-29: qty 1

## 2013-06-29 MED ORDER — PHENYLEPHRINE HCL 10 MG/ML IJ SOLN
10.0000 mg | INTRAVENOUS | Status: DC | PRN
  Administered 2013-06-29: 15 ug/min via INTRAVENOUS

## 2013-06-29 MED ORDER — GLYCOPYRROLATE 0.2 MG/ML IJ SOLN
INTRAMUSCULAR | Status: DC | PRN
  Administered 2013-06-29: 0.4 mg via INTRAVENOUS

## 2013-06-29 MED ORDER — LIDOCAINE HCL (CARDIAC) 20 MG/ML IV SOLN
INTRAVENOUS | Status: DC | PRN
  Administered 2013-06-29: 70 mg via INTRAVENOUS

## 2013-06-29 MED ORDER — OXYCODONE HCL 5 MG PO TABS
5.0000 mg | ORAL_TABLET | ORAL | Status: DC | PRN
  Administered 2013-06-30: 10 mg via ORAL
  Filled 2013-06-29: qty 2

## 2013-06-29 MED ORDER — ACETAMINOPHEN 325 MG PO TABS: 325.0000 mg | ORAL_TABLET | ORAL | Status: DC | PRN

## 2013-06-29 MED ORDER — METOPROLOL TARTRATE 1 MG/ML IV SOLN
2.0000 mg | INTRAVENOUS | Status: DC | PRN
Start: 2013-06-29 — End: 2013-06-30

## 2013-06-29 MED ORDER — ROCURONIUM BROMIDE 100 MG/10ML IV SOLN
INTRAVENOUS | Status: DC | PRN
  Administered 2013-06-29: 40 mg via INTRAVENOUS

## 2013-06-29 MED ORDER — POTASSIUM CHLORIDE CRYS ER 20 MEQ PO TBCR: 20.0000 meq | EXTENDED_RELEASE_TABLET | Freq: Once | ORAL | Status: AC | PRN

## 2013-06-29 MED ORDER — FENTANYL CITRATE 0.05 MG/ML IJ SOLN
INTRAMUSCULAR | Status: DC | PRN
  Administered 2013-06-29 (×3): 50 ug via INTRAVENOUS

## 2013-06-29 MED ORDER — NEBIVOLOL HCL 5 MG PO TABS: 5.0000 mg | ORAL_TABLET | Freq: Every day | ORAL | Status: DC

## 2013-06-29 MED ORDER — DEXTROSE 5 % IV SOLN
1.5000 g | Freq: Two times a day (BID) | INTRAVENOUS | Status: AC
  Administered 2013-06-29 – 2013-06-30 (×2): 1.5 g via INTRAVENOUS
  Filled 2013-06-29 (×2): qty 1.5

## 2013-06-29 MED ORDER — SODIUM CHLORIDE 0.9 % IV SOLN: INTRAVENOUS | Status: DC

## 2013-06-29 MED ORDER — HYDROMORPHONE HCL PF 1 MG/ML IJ SOLN
INTRAMUSCULAR | Status: AC
  Filled 2013-06-29: qty 1

## 2013-06-29 MED ORDER — NEBIVOLOL HCL 10 MG PO TABS: 10.0000 mg | ORAL_TABLET | Freq: Every day | ORAL | Status: DC

## 2013-06-29 MED ORDER — NEBIVOLOL HCL 5 MG PO TABS
15.0000 mg | ORAL_TABLET | Freq: Every day | ORAL | Status: DC
  Administered 2013-06-30: 15 mg via ORAL
  Filled 2013-06-29: qty 1

## 2013-06-29 MED ORDER — OXYCODONE HCL 5 MG/5ML PO SOLN: 5.0000 mg | Freq: Once | ORAL | Status: DC | PRN

## 2013-06-29 MED ORDER — 0.9 % SODIUM CHLORIDE (POUR BTL) OPTIME
TOPICAL | Status: DC | PRN
  Administered 2013-06-29: 2000 mL

## 2013-06-29 MED ORDER — OXYCODONE HCL 5 MG PO TABS: 5.0000 mg | ORAL_TABLET | Freq: Four times a day (QID) | ORAL | Status: DC | PRN

## 2013-06-29 MED ORDER — NEOSTIGMINE METHYLSULFATE 1 MG/ML IJ SOLN
INTRAMUSCULAR | Status: DC | PRN
  Administered 2013-06-29: 3 mg via INTRAVENOUS

## 2013-06-29 MED ORDER — INSULIN ASPART 100 UNIT/ML ~~LOC~~ SOLN
0.0000 [IU] | Freq: Three times a day (TID) | SUBCUTANEOUS | Status: DC
  Administered 2013-06-29 – 2013-06-30 (×3): 2 [IU] via SUBCUTANEOUS

## 2013-06-29 MED ORDER — ONDANSETRON HCL 4 MG/2ML IJ SOLN
4.0000 mg | Freq: Four times a day (QID) | INTRAMUSCULAR | Status: DC | PRN
  Administered 2013-06-29 – 2013-06-30 (×2): 4 mg via INTRAVENOUS
  Filled 2013-06-29 (×2): qty 2

## 2013-06-29 MED ORDER — LABETALOL HCL 5 MG/ML IV SOLN: 10.0000 mg | INTRAVENOUS | Status: DC | PRN

## 2013-06-29 MED ORDER — SODIUM CHLORIDE 0.9 % IR SOLN
Status: DC | PRN
  Administered 2013-06-29: 09:00:00

## 2013-06-29 MED ORDER — SODIUM CHLORIDE 0.9 % IV SOLN: 500.0000 mL | Freq: Once | INTRAVENOUS | Status: AC | PRN

## 2013-06-29 MED ORDER — HYDROMORPHONE HCL PF 1 MG/ML IJ SOLN
0.5000 mg | INTRAMUSCULAR | Status: DC | PRN
  Administered 2013-06-29 – 2013-06-30 (×4): 1 mg via INTRAVENOUS
  Administered 2013-06-30: 0.5 mg via INTRAVENOUS
  Filled 2013-06-29 (×5): qty 1

## 2013-06-29 MED ORDER — METFORMIN HCL 500 MG PO TABS
1000.0000 mg | ORAL_TABLET | Freq: Two times a day (BID) | ORAL | Status: DC
  Filled 2013-06-29 (×2): qty 2

## 2013-06-29 MED ORDER — LINAGLIPTIN 5 MG PO TABS
2.5000 mg | ORAL_TABLET | Freq: Two times a day (BID) | ORAL | Status: DC
  Administered 2013-06-30: 2.5 mg via ORAL
  Filled 2013-06-29 (×4): qty 1

## 2013-06-29 MED ORDER — LACTATED RINGERS IV SOLN
INTRAVENOUS | Status: DC | PRN
  Administered 2013-06-29 (×2): via INTRAVENOUS

## 2013-06-29 MED ORDER — PROPOFOL 10 MG/ML IV BOLUS
INTRAVENOUS | Status: DC | PRN
  Administered 2013-06-29: 140 mg via INTRAVENOUS

## 2013-06-29 MED ORDER — OXYCODONE HCL 5 MG PO TABS: 5.0000 mg | ORAL_TABLET | Freq: Once | ORAL | Status: DC | PRN

## 2013-06-29 MED ORDER — OMEGA-3-ACID ETHYL ESTERS 1 G PO CAPS
1.0000 g | ORAL_CAPSULE | Freq: Every day | ORAL | Status: DC
  Administered 2013-06-30: 1 g via ORAL
  Filled 2013-06-29 (×2): qty 1

## 2013-06-29 MED ORDER — ONDANSETRON HCL 4 MG/2ML IJ SOLN
INTRAMUSCULAR | Status: DC | PRN
  Administered 2013-06-29: 4 mg via INTRAVENOUS

## 2013-06-29 MED ORDER — SAXAGLIPTIN-METFORMIN ER 2.5-1000 MG PO TB24: 1.0000 | ORAL_TABLET | Freq: Two times a day (BID) | ORAL | Status: DC

## 2013-06-29 SURGICAL SUPPLY — 44 items
CANISTER SUCTION 2500CC (MISCELLANEOUS) ×2 IMPLANT
CATH ROBINSON RED A/P 18FR (CATHETERS) ×2 IMPLANT
CATH SUCT 10FR WHISTLE TIP (CATHETERS) ×2 IMPLANT
CLIP TI MEDIUM 24 (CLIP) ×2 IMPLANT
CLIP TI WIDE RED SMALL 24 (CLIP) ×2 IMPLANT
COVER SURGICAL LIGHT HANDLE (MISCELLANEOUS) ×2 IMPLANT
CRADLE DONUT ADULT HEAD (MISCELLANEOUS) ×2 IMPLANT
DECANTER SPIKE VIAL GLASS SM (MISCELLANEOUS) IMPLANT
DRAIN HEMOVAC 1/8 X 5 (WOUND CARE) IMPLANT
DRAPE PROXIMA HALF (DRAPES) ×1 IMPLANT
DRAPE WARM FLUID 44X44 (DRAPE) ×2 IMPLANT
DRSG COVADERM 4X8 (GAUZE/BANDAGES/DRESSINGS) ×1 IMPLANT
ELECT REM PT RETURN 9FT ADLT (ELECTROSURGICAL) ×2
ELECTRODE REM PT RTRN 9FT ADLT (ELECTROSURGICAL) ×1 IMPLANT
EVACUATOR SILICONE 100CC (DRAIN) IMPLANT
GLOVE BIOGEL M 6.5 STRL (GLOVE) ×2 IMPLANT
GLOVE BIOGEL PI IND STRL 6.5 (GLOVE) IMPLANT
GLOVE BIOGEL PI IND STRL 7.0 (GLOVE) IMPLANT
GLOVE BIOGEL PI INDICATOR 6.5 (GLOVE) ×3
GLOVE BIOGEL PI INDICATOR 7.0 (GLOVE) ×1
GLOVE SS BIOGEL STRL SZ 7 (GLOVE) ×1 IMPLANT
GLOVE SUPERSENSE BIOGEL SZ 7 (GLOVE) ×1
GOWN STRL NON-REIN LRG LVL3 (GOWN DISPOSABLE) ×6 IMPLANT
GOWN STRL REIN XL XLG (GOWN DISPOSABLE) ×1 IMPLANT
INSERT FOGARTY SM (MISCELLANEOUS) ×2 IMPLANT
KIT BASIN OR (CUSTOM PROCEDURE TRAY) ×2 IMPLANT
KIT ROOM TURNOVER OR (KITS) ×2 IMPLANT
NEEDLE 22X1 1/2 (OR ONLY) (NEEDLE) IMPLANT
NS IRRIG 1000ML POUR BTL (IV SOLUTION) ×4 IMPLANT
PACK CAROTID (CUSTOM PROCEDURE TRAY) ×2 IMPLANT
PAD ARMBOARD 7.5X6 YLW CONV (MISCELLANEOUS) ×4 IMPLANT
PATCH HEMASHIELD 8X75 (Vascular Products) ×1 IMPLANT
SHUNT CAROTID BYPASS 12FRX15.5 (VASCULAR PRODUCTS) IMPLANT
SUT PROLENE 6 0 CC (SUTURE) ×2 IMPLANT
SUT SILK 2 0 FS (SUTURE) ×2 IMPLANT
SUT SILK 3 0 TIES 17X18 (SUTURE)
SUT SILK 3-0 18XBRD TIE BLK (SUTURE) IMPLANT
SUT VIC AB 2-0 CT1 27 (SUTURE) ×2
SUT VIC AB 2-0 CT1 TAPERPNT 27 (SUTURE) ×1 IMPLANT
SUT VIC AB 3-0 X1 27 (SUTURE) ×2 IMPLANT
SYR CONTROL 10ML LL (SYRINGE) IMPLANT
TOWEL OR 17X24 6PK STRL BLUE (TOWEL DISPOSABLE) ×2 IMPLANT
TOWEL OR 17X26 10 PK STRL BLUE (TOWEL DISPOSABLE) ×2 IMPLANT
WATER STERILE IRR 1000ML POUR (IV SOLUTION) ×2 IMPLANT

## 2013-06-29 NOTE — Transfer of Care (Signed)
Immediate Anesthesia Transfer of Care Note  Patient: Faith Gaines  Procedure(s) Performed: Procedure(s): ENDARTERECTOMY CAROTID-RIGHT, WITH DACRON PATCH ANGIOPLASTY (Right)  Patient Location: PACU  Anesthesia Type:General  Level of Consciousness: awake, alert  and oriented  Airway & Oxygen Therapy: Patient Spontanous Breathing and Patient connected to face mask oxygen  Post-op Assessment: Report given to PACU RN, Post -op Vital signs reviewed and stable and Patient moving all extremities  Post vital signs: Reviewed and stable  Complications: No apparent anesthesia complications

## 2013-06-29 NOTE — Telephone Encounter (Addendum)
Message copied by Fredrich Birks on Thu Jun 29, 2013  2:21 PM ------      Message from: [place], [place] K      Created: Thu Jun 29, 2013 10:04 AM      Regarding: schedule                   ----- Message -----         From: Dara Lords, PA-C         Sent: 06/29/2013   9:50 AM           To: Sharee Pimple, CMA, [facility]            S/p right CEA 06/29/13.  F/u with Dr. Hart Rochester in 2 weeks.            Thanks,      Lelon Mast ------  06/29/13: lm for pt re appt, dpm

## 2013-06-29 NOTE — Anesthesia Postprocedure Evaluation (Signed)
  Anesthesia Post-op Note  Patient: Faith Gaines  Procedure(s) Performed: Procedure(s): ENDARTERECTOMY CAROTID-RIGHT, WITH DACRON PATCH ANGIOPLASTY (Right)  Patient Location: PACU  Anesthesia Type:General  Level of Consciousness: awake and alert   Airway and Oxygen Therapy: Patient Spontanous Breathing  Post-op Pain: mild  Post-op Assessment: Post-op Vital signs reviewed  Post-op Vital Signs: stable  Complications: No apparent anesthesia complications

## 2013-06-29 NOTE — Progress Notes (Signed)
Utilization review completed.  

## 2013-06-29 NOTE — Anesthesia Preprocedure Evaluation (Signed)
Anesthesia Evaluation  Patient identified by MRN, date of birth, ID band Patient awake    Reviewed: Allergy & Precautions, NPO status , Patient's Chart, lab work & pertinent test results  Airway Mallampati: II  Neck ROM: Full    Dental   Pulmonary former smoker,  breath sounds clear to auscultation        Cardiovascular hypertension, + Peripheral Vascular Disease Rhythm:Regular Rate:Normal     Neuro/Psych    GI/Hepatic GERD-  ,  Endo/Other  diabetes  Renal/GU Renal InsufficiencyRenal disease     Musculoskeletal   Abdominal   Peds  Hematology  (+) anemia ,   Anesthesia Other Findings   Reproductive/Obstetrics                           Anesthesia Physical Anesthesia Plan  ASA: III  Anesthesia Plan: General   Post-op Pain Management:    Induction: Intravenous  Airway Management Planned: Oral ETT  Additional Equipment: Arterial line  Intra-op Plan:   Post-operative Plan: Extubation in OR  Informed Consent: I have reviewed the patients History and Physical, chart, labs and discussed the procedure including the risks, benefits and alternatives for the proposed anesthesia with the patient or authorized representative who has indicated his/her understanding and acceptance.   Dental advisory given  Plan Discussed with: CRNA and Surgeon  Anesthesia Plan Comments:         Anesthesia Quick Evaluation

## 2013-06-29 NOTE — Interval H&P Note (Signed)
History and Physical Interval Note:  06/29/2013 7:36 AM  Faith Gaines  has presented today for surgery, with the diagnosis of Severe Right Internal Carotid Artery Stenosis   The various methods of treatment have been discussed with the patient and family. After consideration of risks, benefits and other options for treatment, the patient has consented to  Procedure(s): ENDARTERECTOMY CAROTID-RIGHT (Right) as a surgical intervention .  The patient's history has been reviewed, patient examined, no change in status, stable for surgery.  I have reviewed the patient's chart and labs.  Questions were answered to the patient's satisfaction.     Josephina Gip

## 2013-06-29 NOTE — Op Note (Signed)
OPERATIVE REPORT  Date of Surgery: 06/29/2013  Surgeon: Josephina Gip, MD  Assistant: Lorrine Kin  Pre-op Diagnosis: Severe Right Internal Carotid Artery Stenosis -asymptomatic  Post-op Diagnosis: Severe Right Internal Carotid Artery Stenosis-asymptomatic  Procedure: Procedure(s): ENDARTERECTOMY CAROTID-RIGHT, WITH DACRON PATCH ANGIOPLASTY  Anesthesia: General  EBL: 100 cc  Complications: None  Procedure Details: The patient was taken to the operating room and placed in the supine position. Following induction of satisfactory general endotracheal anesthesia the right neck was prepped and draped in a routine sterile manner. Incision was made on the anterior border of the sternocleidomastoid muscle and carried down through the subcutaneous tissue and platysma using the Bovie. Care was taken not to injure the hypoglossal nerve.. The common internal and external carotid arteries were dissected free. There was a calcified atherosclerotic plaque at the carotid bifurcation extending up the internal carotid artery. A #10 shunt was then prepared and the patient was heparinized. The carotid vessels were occluded with vascular clamps. A longitudinal opening was made in the common carotid with a 15 blade extended up the internal carotid with the Potts scissors to a point distal to the disease. The plaque was approximately 80 % stenotic in severity. The distal vessel appeared normal. Shunt was inserted without difficulty reestablishing flow in about 2 minutes. A standard endarterectomy was performed with an eversion endarterectomy of the external carotid. The plaque feathered off  the distal internal carotid artery nicely not requiring any tacking sutures. The lumen was thoroughly irrigated with heparinized saline and loose debris all carefully removed. The arterotomy was then closed with a patch using continuous 6-0 Prolene. Prior to completion of the  Closure the  shunt was removed after  approximately 30 minutes of shunt time. Flow was then reestablished up the external branch initially followed by the internal branch. Protamine was given to her reverse the heparin.Following adequate hemostasis the wound was irrigated with saline and closed in layers with Vicryl ain a subcuticular fashion. Sterile dressing was applied and the patient taken to the recovery room in stable condition.  Josephina Gip, MD 06/29/2013 9:48 AM

## 2013-06-29 NOTE — Progress Notes (Addendum)
[facility] POST OPERATIVE NOTE   Subjective:  C/o a little soreness; pain well controlled with current pain medication regimen.  Neuro exam is in tact.   Assessment/Plan:  67 y/o female who is s/p right CEA Day of Surgery  -pt doing well -neuro exam in tact -anticipate discharge tomorrow   RHYNE, SAMANTHA 06/29/2013 1:16 PM   Neuro intact postop-doing well

## 2013-06-29 NOTE — H&P (View-Only) (Signed)
Subjective:     Patient ID: Faith Gaines, female   DOB: 1945-09-24, 67 y.o.   MRN: [id number]  HPI this 67 year old female is referred for severe right ICA stenosis. She was found to have a carotid bruit and carotid duplex exam was performed which revealed a greater than 80% right ICA stenosis. Her physician Dr. Elias Else referred her for evaluation. She has no history of stroke, lateralizing weakness, aphasia, amaurosis fugax, diplopia, blurred vision, or syncope. She does have a breast issue which will require surgery in the near future.  Past Medical History  Diagnosis Date  . Cancer     breast  . Diabetes mellitus without complication   . Hyperlipidemia   . Hypertension   . Chronic kidney disease   . Carotid bruit   . Trigger finger   . Osteopenia     History  Substance Use Topics  . Smoking status: Former Smoker    Quit date: 07/20/2010  . Smokeless tobacco: Never Used  . Alcohol Use: No    Family History  Problem Relation Age of Onset  . Cancer Mother     breast, uterus, lung  . Heart attack Mother   . Stroke Mother   . Heart disease Mother   . Hypertension Mother   . Varicose Veins Mother   . Heart disease Father   . Heart attack Father   . Kidney disease Father   . Hypertension Father   . Diabetes Father   . Cancer Father   . Heart attack Sister   . Heart disease Sister     Allergies  Allergen Reactions  . Codeine Nausea And Vomiting and Other (See Comments)    Headache and vomiting  . Ace Inhibitors     Drop in GFR  . Clarithromycin     Unable to sleep   . Crestor [Rosuvastatin]     Body aches  . Lipitor [Atorvastatin]     Body aches  . Avandamet [Rosiglitazone-Metformin] Rash    Current outpatient prescriptions:acetaminophen (TYLENOL) 500 MG tablet, Take 500 mg by mouth every 6 (six) hours as needed., Disp: , Rfl: ;  aspirin 81 MG tablet, Take 81 mg by mouth daily., Disp: , Rfl: ;  Canagliflozin (INVOKANA) 300 MG TABS, Take 300 mg by  mouth., Disp: , Rfl: ;  Fish Oil-Cholecalciferol (FISH OIL + D3) 1000-1000 MG-UNIT CAPS, Take by mouth., Disp: , Rfl:  hydrocortisone (ANUSOL-HC) 25 MG suppository, Place 25 mg rectally 2 (two) times daily., Disp: , Rfl: ;  nebivolol (BYSTOLIC) 10 MG tablet, Take 10 mg by mouth daily., Disp: , Rfl: ;  nebivolol (BYSTOLIC) 5 MG tablet, Take 5 mg by mouth daily., Disp: , Rfl: ;  pantoprazole (PROTONIX) 40 MG tablet, Take 40 mg by mouth daily., Disp: , Rfl: ;  Saxagliptin-Metformin (KOMBIGLYZE XR) 2.11-998 MG TB24, Take by mouth., Disp: , Rfl:  amLODipine (NORVASC) 5 MG tablet, Take 5 mg by mouth daily., Disp: , Rfl:   BP 165/57  Pulse 69  Ht 5\' 1"  (1.549 m)  Wt 126 lb 9.6 oz (57.425 kg)  BMI 23.93 kg/m2  SpO2 100%  Body mass index is 23.93 kg/(m^2).          Review of Systems denies chest pain, dyspnea on exertion, PND, orthopnea, claudication, hemoptysis. Does complain of generalized weakness in her lower extremities washed out feeling on occasion. All other systems negative complete review of systems    Objective:   Physical Exam BP 165/57  Pulse 69  Ht 5\' 1"  (1.549 m)  Wt 126 lb 9.6 oz (57.425 kg)  BMI 23.93 kg/m2  SpO2 100%  Gen.-alert and oriented x3 in no apparent distress HEENT normal for age Lungs no rhonchi or wheezing Cardiovascular regular rhythm no murmurs carotid pulses 3+ palpable-harsh bruit over right carotid bifurcation-no bruit on the left  Abdomen soft nontender no palpable masses Musculoskeletal free of  major deformities Skin clear -no rashes Neurologic normal Lower extremities 3+ femoral and dorsalis pedis pulses palpable bilaterally with no edema  Today I ordered a carotid duplex exam of the right side only to compare to the outside exam. We are in agreement that she does have an approximate 80-85% right ICA stenosis with mild/moderate disease on the contralateral left side by the outside study.        Assessment:     80% right ICA  stenosis-asymptomatic    Plan:     Plan right carotid endarterectomy with Dacron patch angioplasty on Thursday, December 11-risks and benefits thoroughly discussed with patient and her 3 children and all are in agreement to proceed

## 2013-06-30 ENCOUNTER — Encounter (HOSPITAL_COMMUNITY): Payer: Self-pay | Admitting: Vascular Surgery

## 2013-06-30 LAB — BASIC METABOLIC PANEL
BUN: 11 mg/dL (ref 6–23)
CO2: 21 mEq/L (ref 19–32)
Calcium: 8.9 mg/dL (ref 8.4–10.5)
Chloride: 104 mEq/L (ref 96–112)
Creatinine, Ser: 0.93 mg/dL (ref 0.50–1.10)
GFR calc Af Amer: 73 mL/min — ABNORMAL LOW (ref >90)
GFR calc non Af Amer: 63 mL/min — ABNORMAL LOW (ref >90)
Glucose, Bld: 123 mg/dL — ABNORMAL HIGH (ref 70–99)
Potassium: 3.7 mEq/L (ref 3.5–5.1)
Sodium: 136 mEq/L (ref 135–145)

## 2013-06-30 LAB — GLUCOSE, CAPILLARY
Glucose-Capillary: 153 mg/dL — ABNORMAL HIGH (ref 70–99)
Glucose-Capillary: 164 mg/dL — ABNORMAL HIGH (ref 70–99)

## 2013-06-30 LAB — CBC
HCT: 33.7 % — ABNORMAL LOW (ref 36.0–46.0)
Hemoglobin: 10.5 g/dL — ABNORMAL LOW (ref 12.0–15.0)
MCH: 25.4 pg — ABNORMAL LOW (ref 26.0–34.0)
MCHC: 31.2 g/dL (ref 30.0–36.0)
MCV: 81.4 fL (ref 78.0–100.0)
Platelets: 218 10*3/uL (ref 150–400)
RBC: 4.14 MIL/uL (ref 3.87–5.11)
RDW: 14.5 % (ref 11.5–15.5)
WBC: 11.1 10*3/uL — ABNORMAL HIGH (ref 4.0–10.5)

## 2013-06-30 MED ORDER — CANAGLIFLOZIN 300 MG PO TABS
300.0000 mg | ORAL_TABLET | Freq: Every day | ORAL | Status: AC
  Administered 2013-06-30: 300 mg via ORAL
  Filled 2013-06-30: qty 1

## 2013-06-30 MED ORDER — HYDROCODONE-ACETAMINOPHEN 5-325 MG PO TABS: 1.0000 | ORAL_TABLET | ORAL | Status: DC | PRN

## 2013-06-30 MED ORDER — HYDROCODONE-ACETAMINOPHEN 5-325 MG PO TABS
1.0000 | ORAL_TABLET | Freq: Four times a day (QID) | ORAL | Status: DC | PRN
  Administered 2013-06-30: 1 via ORAL
  Filled 2013-06-30: qty 1

## 2013-06-30 NOTE — Progress Notes (Addendum)
[facility] Progress Note  06/30/2013 7:23 AM 1 Day Post-Op  Subjective:  Dizzy with movement; also c/o some nausea-thinks it might improve with something to eat.  Afebrile HR 60's-70's regular 100-120's systolic 96% RA  Filed Vitals:   06/30/13 0400  BP: 111/55  Pulse: 66  Temp: 98.6 F (37 C)  Resp: 26     Physical Exam: Neuro:  In tact Incision:  C/d/i Abdomen:  Soft NT/ND Lungs:  Non-labored  CBC    Component Value Date/Time   WBC 11.1* 06/30/2013 0520   RBC 4.14 06/30/2013 0520   HGB 10.5* 06/30/2013 0520   HCT 33.7* 06/30/2013 0520   PLT 218 06/30/2013 0520   MCV 81.4 06/30/2013 0520   MCH 25.4* 06/30/2013 0520   MCHC 31.2 06/30/2013 0520   RDW 14.5 06/30/2013 0520    BMET    Component Value Date/Time   NA 136 06/30/2013 0520   K 3.7 06/30/2013 0520   CL 104 06/30/2013 0520   CO2 21 06/30/2013 0520   GLUCOSE 123* 06/30/2013 0520   BUN 11 06/30/2013 0520   CREATININE 0.93 06/30/2013 0520   CALCIUM 8.9 06/30/2013 0520   GFRNONAA 63* 06/30/2013 0520   GFRAA 73* 06/30/2013 0520     Intake/Output Summary (Last 24 hours) at 06/30/13 0723 Last data filed at 06/29/13 1700  Gross per 24 hour  Intake   1710 ml  Output    300 ml  Net   1410 ml      Assessment/Plan:  This is a 67 y.o. female who is s/p right CEA 1 Day Post-Op  -pt is doing well this am with exception of dizziness while up.  States her PCP called her a couple of nights ago and told her that her iron is low and to start supplements.   -pt neuro exam is in tact -pt has hx of Iron Deficiency Anemia-minimal blood loss in OR-therefore, will not transfuse. -pt has ambulated    Doreatha Massed, PA-C [facility] [phone] Agree with above assessment Neuro exam intact Right neck incision healing nicely with no hematoma Patient complains of some mild dizziness when ambulating similar to what she was experiencing preoperatively-wants to go  home  We'll DC IV and ambulate patient and DC home later this morning if she feels okay-hemoglobin 10-minimal blood loss at time of surgery

## 2013-06-30 NOTE — Discharge Summary (Signed)
[facility] Discharge Summary  Faith Gaines 1945-11-27 67 y.o. female  [id number]  Admission Date: 06/29/2013  Discharge Date: 06/30/13  Physician: Pryor Ochoa, MD  Admission Diagnosis: Severe Right Internal Carotid Artery Stenosis    HPI:   This is a 67 y.o. female is referred for severe right ICA stenosis. She was found to have a carotid bruit and carotid duplex exam was performed which revealed a greater than 80% right ICA stenosis. Her physician Dr. Elias Else referred her for evaluation. She has no history of stroke, lateralizing weakness, aphasia, amaurosis fugax, diplopia, blurred vision, or syncope. She does have a breast issue which will require surgery in the near future.   Hospital Course:  The patient was admitted to the hospital and taken to the operating room on 06/29/2013 and underwent right carotid endarterectomy.  The pt tolerated the procedure well and was transported to the PACU in good condition.   By POD 1, the pt neuro status was in tact.  She did complain of weakness, but had weakness prior to surgery due to Iron deficiency anemia.  She has complained of a frontal headache, which was relieved with dilaudid IV.  She was given OxyIR before the dilaudid and this did not control her pain.  She states that Vicodin has worked for her in the past and this is prescribed.  This did improve and the pt was sent home later in the day on POD 1.  The remainder of the hospital course consisted of increasing mobilization and increasing intake of solids without difficulty.    Recent Labs  06/28/13 1335 06/30/13 0520  NA 140 136  K 4.7 3.7  CL 102 104  CO2 24 21  GLUCOSE 201* 123*  BUN 17 11  CALCIUM 10.9* 8.9    Recent Labs  06/28/13 1335 06/30/13 0520  WBC 10.2 11.1*  HGB 13.0 10.5*  HCT 40.5 33.7*  PLT 296 218    Recent Labs  06/28/13 1335  INR 0.94    Discharge Instructions:   The patient is discharged to home with  extensive instructions on wound care and progressive ambulation.  They are instructed not to drive or perform any heavy lifting until returning to see the physician in his office.  Discharge Orders   Future Appointments Provider Department Dept Phone   07/18/2013 8:30 AM Pryor Ochoa, MD [facility] [phone]   Future Orders Complete By Expires   Call MD for:  redness, tenderness, or signs of infection (pain, swelling, bleeding, redness, odor or green/yellow discharge around incision site)  As directed    Call MD for:  severe or increased pain, loss or decreased feeling  in affected limb(s)  As directed    Call MD for:  temperature >100.5  As directed    CAROTID Sugery: Call MD for difficulty swallowing or speaking; weakness in arms or legs that is a new symtom; severe headache.  If you have increased swelling in the neck and/or  are having difficulty breathing, CALL 911  As directed    Discharge wound care:  As directed    Comments:     Shower daily with soap and water starting 07/01/13   Driving Restrictions  As directed    Comments:     No driving for 2 weeks   Lifting restrictions  As directed    Comments:     No lifting for 2 weeks   Nursing communication  As directed  Scheduling Instructions:     Please give paper Rx to patient at discharge.  It is located on the paper chart.   Resume previous diet  As directed       Discharge Diagnosis:  Severe Right Internal Carotid Artery Stenosis   Secondary Diagnosis: Patient Active Problem List   Diagnosis Date Noted  . Carotid artery disease 06/29/2013  . Occlusion and stenosis of carotid artery without mention of cerebral infarction 06/27/2013  . Breast cancer of upper-outer quadrant of left female breast, Tis, 3 oclock 06/23/2013   Past Medical History  Diagnosis Date  . Diabetes mellitus without complication   . Hyperlipidemia   . Hypertension   . Chronic kidney disease   . Carotid bruit    . Trigger finger   . Osteopenia   . Pneumonia     hx x2 13  . Peripheral vascular disease     rt carotid stenosis  . GERD (gastroesophageal reflux disease)     occ  . Cancer     breast lft to have surgery, melanoma  . Anemia       Medication List         aspirin 81 MG tablet  Take 81 mg by mouth daily.     HYDROcodone-acetaminophen 5-325 MG per tablet  Commonly known as:  NORCO/VICODIN  Take 1 tablet by mouth every 4 (four) hours as needed (mild pain).     INVOKANA 300 MG Tabs  Generic drug:  Canagliflozin  Take 300 mg by mouth.     KOMBIGLYZE XR 2.11-998 MG Tb24  Generic drug:  Saxagliptin-Metformin  Take 1 tablet by mouth 2 (two) times daily.     nebivolol 10 MG tablet  Commonly known as:  BYSTOLIC  Take 10 mg by mouth daily.     nebivolol 5 MG tablet  Commonly known as:  BYSTOLIC  Take 5 mg by mouth daily.     Omega 3 1200 MG Caps  Take 1 capsule by mouth daily.     omeprazole-sodium bicarbonate 40-1100 MG per capsule  Commonly known as:  ZEGERID  Take 1 capsule by mouth daily before breakfast.        Norco 5/325 #30 No Refill  Disposition: home  Patient's condition: is Good  Follow up: 1. Dr.  Hart Rochester in 2 weeks.   Doreatha Massed, PA-C [facility] [phone]  --- For [facility] use --- Instructions: Press F2 to tab through selections.  Delete question if not applicable.   Modified Rankin score at D/C (0-6): 0  IV medication needed for:  1. Hypertension: No 2. Hypotension: No  Post-op Complications: No  1. Post-op CVA or TIA: No  If yes: Event classification (right eye, left eye, right cortical, left cortical, verterobasilar, other): n/a  If yes: Timing of event (intra-op, <6 hrs post-op, >=6 hrs post-op, unknown): n/a  2. CN injury: No  If yes: CN n/a injuried   3. Myocardial infarction: No  If yes: Dx by (EKG or clinical, Troponin): n/a  4.  CHF: No  5.  Dysrhythmia (new): No  6. Wound infection:  No  7. Reperfusion symptoms: No  8. Return to OR: No  If yes: return to OR for (bleeding, neurologic, other CEA incision, other): n/a  Discharge medications: Statin use:  No If No: [ x] For Medical reasons-allergic, [ ]  Non-compliant, [ ]  Not-indicated ASA use:  Yes  If No: [ ]  For Medical reasons, [ ]  Non-compliant, [ ]  Not-indicated Beta blocker  use:  Yes If No: [ ]  For Medical reasons, [ ]  Non-compliant, [ ]  Not-indicated ACE-Inhibitor use:  No If No: [ ]  For Medical reasons, [ ]  Non-compliant, [ ]  Not-indicated P2Y12 Antagonist use: no, [ ]  Plavix, [ ]  Plasugrel, [ ]  Ticlopinine, [ ]  Ticagrelor, [ ]  Other, [ ]  No for medical reason, [ ]  Non-compliant, [ ]  Not-indicated Anti-coagulant use:  no, [ ]  Warfarin, [ ]  Rivaroxaban, [ ]  Dabigatran, [ ]  Other, [ ]  No for medical reason, [ ]  Non-compliant, [ ]  Not-indicated

## 2013-06-30 NOTE — Progress Notes (Signed)
Discharge instructions given to patient and daughter with teach back.  All questions answered.  No complaints of pain, only soreness right neck. Discharged home with daughter.

## 2013-06-30 NOTE — Clinical Documentation Improvement (Addendum)
THIS DOCUMENT IS NOT A PERMANENT PART OF THE MEDICAL RECORD  Please update your documentation with the medical record to reflect your response to this query. If you need help knowing how to do this please call [phone].  06/30/13  Dear Joanell Rising, PA-C  In an effort to better capture your patient's severity of illness, reflect appropriate length of stay and utilization of resources, a review of the patient medical record has revealed the following indicators.    Based on your clinical judgment, please clarify and document in a progress note and/or discharge summary the clinical condition associated with the following supporting information: In your Progress Notes from 06/30/13, you documented "Did have an acute surgical blood loss-? Benefit from one unit PRBC's-will d/w Dr. Hart Rochester."   In responding to this query please exercise your independent judgment.  The fact that a query is asked, does not imply that any particular answer is desired or expected.  Possible Clinical Conditions?        Expected Acute Blood Loss Anemia      Acute Blood Loss Anemia      Cannot Clinically Determine   Risk Factors: (recent surgery, pre op anemia, EBL in OR): S/P Right CEA post op day 1.   Supporting Information:  Signs and Symptoms (unable to ambulate, weakness, dizziness, unable to participate in care): "Pt is doing well this am with exception of dizziness while up."   Pre-OP H&H:  13.0/40.5 Post OP H&H:  10.5/33.7  Reviewed: additional documentation in the medical record  Thank You,  Darla Lesches, RN, BSN, CCRN Clinical Documentation Improvement Specialist HIM department--[place] Office [phone]     Artery with above assessment

## 2013-07-02 LAB — TYPE AND SCREEN
ABO/RH(D): O POS
Antibody Screen: NEGATIVE
Unit division: 0
Unit division: 0

## 2013-07-03 LAB — GLUCOSE, CAPILLARY: Glucose-Capillary: 145 mg/dL — ABNORMAL HIGH (ref 70–99)

## 2013-07-17 ENCOUNTER — Encounter: Payer: Self-pay | Admitting: Vascular Surgery

## 2013-07-18 ENCOUNTER — Ambulatory Visit (INDEPENDENT_AMBULATORY_CARE_PROVIDER_SITE_OTHER): Payer: Self-pay | Admitting: Vascular Surgery

## 2013-07-18 ENCOUNTER — Telehealth (INDEPENDENT_AMBULATORY_CARE_PROVIDER_SITE_OTHER): Payer: Self-pay

## 2013-07-18 ENCOUNTER — Other Ambulatory Visit (INDEPENDENT_AMBULATORY_CARE_PROVIDER_SITE_OTHER): Payer: Self-pay | Admitting: General Surgery

## 2013-07-18 ENCOUNTER — Encounter: Payer: Self-pay | Admitting: Vascular Surgery

## 2013-07-18 DIAGNOSIS — C50912 Malignant neoplasm of unspecified site of left female breast: Secondary | ICD-10-CM

## 2013-07-18 DIAGNOSIS — Z48812 Encounter for surgical aftercare following surgery on the circulatory system: Secondary | ICD-10-CM

## 2013-07-18 DIAGNOSIS — I6529 Occlusion and stenosis of unspecified carotid artery: Secondary | ICD-10-CM

## 2013-07-18 NOTE — Telephone Encounter (Signed)
Verified pt has received clearance to stop all anti-coagulants prior to her breast surgery.  They will fax Dr. Candie Chroman dictation and we will have surgery schedulers call the patient.  Pt to be made aware.

## 2013-07-18 NOTE — Progress Notes (Signed)
Subjective:     Patient ID: Faith Gaines, female   DOB: [birth year], 67 y.o.   MRN: [id number]  HPI this 67 year old female returns for initial followup regarding a right carotid and arterectomy performed by me on 06/29/2013 for an asymptomatic severe right ICA stenosis. She has had no difficulty swallowing or hoarseness. She denies any lateralizing weakness, aphasia, amaurosis fugax, diplopia, blurred vision, or syncope. She has had right-sided post occipital headaches as well as some discomfort in the lower aspect of her right ear and numbness anterior to her incision all of which is slowly improving. She continues to take 1 aspirin per day.   Review of Systems     Objective:   Physical Exam BP 152/73  Pulse 67  Ht 5' (1.524 m)  Wt 124 lb 1.6 oz (56.291 kg)  BMI 24.24 kg/m2  SpO2 100%  General well-developed well-nourished female no apparent stress alert and oriented x3 Right neck incision has healed nicely. 3+ carotid pulses no audible bruit. Neurologic exam normal.     Assessment:     Doing well 2 weeks post right carotid endarterectomy for severe right ICA stenosis with post occipital headaches and some right lower ear discomfort no neurologic issues    Plan:     Patient to proceed with her left breast surgery in the near future and will discontinue her aspirin prior to that surgery and then resume postop. Return to see me in 6 months with followup carotid duplex exam Will be in touch with [place] if problems occur in the interim

## 2013-07-20 DIAGNOSIS — C50919 Malignant neoplasm of unspecified site of unspecified female breast: Secondary | ICD-10-CM

## 2013-07-20 HISTORY — PX: BREAST LUMPECTOMY: SHX2

## 2013-07-20 HISTORY — DX: Malignant neoplasm of unspecified site of unspecified female breast: C50.919

## 2013-07-21 ENCOUNTER — Telehealth (INDEPENDENT_AMBULATORY_CARE_PROVIDER_SITE_OTHER): Payer: Self-pay

## 2013-07-21 NOTE — Telephone Encounter (Signed)
Pt transferred from [facility] in  surgery scheduling. Pt wants to know when she can schedule her surgery. She states someone called her Weds to set up surgery and she did not get there name. I have left a msg on Katie's VM that surgery orders were placed in epic 12-30 by Dr Barry Dienes and clearance was given to Dr Barry Dienes by pts vascular surgeon. I advised pt I have sent msg to scheduling and they should be calling her back.

## 2013-07-24 DIAGNOSIS — F17200 Nicotine dependence, unspecified, uncomplicated: Secondary | ICD-10-CM | POA: Diagnosis not present

## 2013-07-24 DIAGNOSIS — Z79899 Other long term (current) drug therapy: Secondary | ICD-10-CM | POA: Diagnosis not present

## 2013-07-24 DIAGNOSIS — I129 Hypertensive chronic kidney disease with stage 1 through stage 4 chronic kidney disease, or unspecified chronic kidney disease: Secondary | ICD-10-CM | POA: Diagnosis not present

## 2013-07-24 DIAGNOSIS — N183 Chronic kidney disease, stage 3 unspecified: Secondary | ICD-10-CM | POA: Diagnosis not present

## 2013-07-24 DIAGNOSIS — E119 Type 2 diabetes mellitus without complications: Secondary | ICD-10-CM | POA: Diagnosis not present

## 2013-07-24 DIAGNOSIS — E785 Hyperlipidemia, unspecified: Secondary | ICD-10-CM | POA: Diagnosis not present

## 2013-07-24 DIAGNOSIS — D0590 Unspecified type of carcinoma in situ of unspecified breast: Secondary | ICD-10-CM | POA: Diagnosis not present

## 2013-07-25 ENCOUNTER — Other Ambulatory Visit (INDEPENDENT_AMBULATORY_CARE_PROVIDER_SITE_OTHER): Payer: Self-pay | Admitting: General Surgery

## 2013-07-27 DIAGNOSIS — E119 Type 2 diabetes mellitus without complications: Secondary | ICD-10-CM | POA: Diagnosis not present

## 2013-07-27 DIAGNOSIS — Z79899 Other long term (current) drug therapy: Secondary | ICD-10-CM | POA: Diagnosis not present

## 2013-07-27 DIAGNOSIS — R51 Headache: Secondary | ICD-10-CM | POA: Diagnosis not present

## 2013-07-27 DIAGNOSIS — Z9889 Other specified postprocedural states: Secondary | ICD-10-CM | POA: Diagnosis not present

## 2013-07-27 DIAGNOSIS — R197 Diarrhea, unspecified: Secondary | ICD-10-CM | POA: Diagnosis not present

## 2013-07-27 DIAGNOSIS — K649 Unspecified hemorrhoids: Secondary | ICD-10-CM | POA: Diagnosis not present

## 2013-07-27 DIAGNOSIS — I129 Hypertensive chronic kidney disease with stage 1 through stage 4 chronic kidney disease, or unspecified chronic kidney disease: Secondary | ICD-10-CM | POA: Diagnosis not present

## 2013-07-27 DIAGNOSIS — D0590 Unspecified type of carcinoma in situ of unspecified breast: Secondary | ICD-10-CM | POA: Diagnosis not present

## 2013-07-27 DIAGNOSIS — Z85820 Personal history of malignant melanoma of skin: Secondary | ICD-10-CM | POA: Diagnosis not present

## 2013-07-27 DIAGNOSIS — Z803 Family history of malignant neoplasm of breast: Secondary | ICD-10-CM | POA: Diagnosis not present

## 2013-07-27 DIAGNOSIS — Z7982 Long term (current) use of aspirin: Secondary | ICD-10-CM | POA: Diagnosis not present

## 2013-07-27 DIAGNOSIS — N189 Chronic kidney disease, unspecified: Secondary | ICD-10-CM | POA: Diagnosis not present

## 2013-07-27 DIAGNOSIS — E785 Hyperlipidemia, unspecified: Secondary | ICD-10-CM | POA: Diagnosis not present

## 2013-08-01 ENCOUNTER — Encounter: Payer: Self-pay | Admitting: Vascular Surgery

## 2013-08-01 ENCOUNTER — Other Ambulatory Visit: Payer: Self-pay | Admitting: Vascular Surgery

## 2013-08-01 ENCOUNTER — Ambulatory Visit (HOSPITAL_COMMUNITY)
Admission: RE | Admit: 2013-08-01 | Discharge: 2013-08-01 | Disposition: A | Discharge status: Home / Self Care | Payer: Medicare Other | Source: Ambulatory Visit | Attending: Vascular Surgery | Admitting: Vascular Surgery

## 2013-08-01 ENCOUNTER — Ambulatory Visit (INDEPENDENT_AMBULATORY_CARE_PROVIDER_SITE_OTHER): Payer: Self-pay | Admitting: Vascular Surgery

## 2013-08-01 VITALS — BP 150/89 | HR 71 | Resp 16 | Ht 61.0 in | Wt 124.0 lb

## 2013-08-01 DIAGNOSIS — R209 Unspecified disturbances of skin sensation: Secondary | ICD-10-CM | POA: Insufficient documentation

## 2013-08-01 DIAGNOSIS — R519 Headache, unspecified: Secondary | ICD-10-CM | POA: Insufficient documentation

## 2013-08-01 DIAGNOSIS — R51 Headache: Secondary | ICD-10-CM | POA: Insufficient documentation

## 2013-08-01 DIAGNOSIS — I6529 Occlusion and stenosis of unspecified carotid artery: Secondary | ICD-10-CM

## 2013-08-01 NOTE — Progress Notes (Signed)
Subjective:     Patient ID: Faith Gaines, female   DOB: 23-Mar-1946, 68 y.o.   MRN: [id number]  HPI this 68 year old female returns 4 weeks post right carotid endarterectomy for severe asymptomatic stenosis. She has continued to have post occipital headaches and some degree of frontal headaches which are slowly improving. She's had some numbness and tingling and paresthesias anterior to her incision up to the jawline. She also has complained of occasional pain shooting across the front of her neck. She denies any neurologic symptoms.  Review of Systems     Objective:   Physical Exam BP 150/89  Pulse 71  Resp 16  Ht 5\' 1"  (1.549 m)  Wt 124 lb (56.246 kg)  BMI 23.44 kg/m2  General well-developed well-nourished female no apparent distress alert and oriented x3 Neurologic exam normal Right neck incision is healed nicely. The neck is thin. No pulsatile mass is noted. 3+ carotid pulse present.    Today I ordered a carotid duplex exam which I reviewed and interpreted to rule out any Technical problems with the carotid repair. It is widely patent with no evidence of pseudoaneurysm or other abnormalities the left carotid is also widely patent.      Assessment:     4 weeks post right carotid endarterectomy with improving headache and posterior acceptable and frontal area but no neurologic symptomatology Widely patent right carotid by duplex scan    Plan:     Return in 6 months as previously scheduled for carotid duplex exam Given prescription for hydrocodone 5 mg/acetaminophen 325 mg #40 tablets-one every 6 hours when necessary for pain

## 2013-08-10 ENCOUNTER — Encounter (HOSPITAL_COMMUNITY): Payer: Self-pay | Admitting: Pharmacy Technician

## 2013-08-12 NOTE — Pre-Procedure Instructions (Signed)
Briley Bumgarner Maloney  08/12/2013   Your procedure is scheduled on:  February 3  Report to [facility] Entrance "A" [address] at 10:30 AM.  Call this number if you have problems the morning of surgery: [phone]   Remember:   Do not eat food or drink liquids after midnight.   Take these medicines the morning of surgery with A SIP OF WATER: Hydrocodone (if needed), Nebivolol, Protonix,   STOP Aspirin and Fish Oil as directed and discussed with Dr. Barry Dienes and Dr. Kellie Simmering   STOP/ Do not take Aleve, Naproxen, Advil, Ibuprofen, Vitamin, Herbs, or Supplements starting today   Do not wear jewelry, make-up or nail polish.  Do not wear lotions, powders, or perfumes. You may wear deodorant.  Do not shave 48 hours prior to surgery. Men may shave face and neck.  Do not bring valuables to the hospital.  [facility] is not responsible                  for any belongings or valuables.               Contacts, dentures or bridgework may not be worn into surgery.  Leave suitcase in the car. After surgery it may be brought to your room.  For patients admitted to the hospital, discharge time is determined by your                treatment team.               Patients discharged the day of surgery will not be allowed to drive  home.  Name and phone number of your driver: Family/ Friend  Special Instructions: Shower using CHG 2 nights before surgery and the night before surgery.  If you shower the day of surgery use CHG.  Use special wash - you have one bottle of CHG for all showers.  You should use approximately 1/3 of the bottle for each shower.   Please read over the following fact sheets that you were given: Pain Booklet, Coughing and Deep Breathing and Surgical Site Infection Prevention

## 2013-08-14 ENCOUNTER — Encounter (HOSPITAL_COMMUNITY): Payer: Self-pay

## 2013-08-14 ENCOUNTER — Encounter (HOSPITAL_COMMUNITY)
Admission: RE | Admit: 2013-08-14 | Discharge: 2013-08-14 | Disposition: A | Discharge status: Home / Self Care | Payer: Medicare Other | Source: Ambulatory Visit | Attending: General Surgery | Admitting: General Surgery

## 2013-08-14 DIAGNOSIS — Z01812 Encounter for preprocedural laboratory examination: Secondary | ICD-10-CM | POA: Insufficient documentation

## 2013-08-14 DIAGNOSIS — Z01818 Encounter for other preprocedural examination: Secondary | ICD-10-CM | POA: Diagnosis not present

## 2013-08-14 HISTORY — DX: Headache: R51

## 2013-08-14 HISTORY — DX: Diarrhea, unspecified: R19.7

## 2013-08-14 HISTORY — DX: Urgency of urination: R39.15

## 2013-08-14 HISTORY — DX: Other specified postprocedural states: Z98.890

## 2013-08-14 HISTORY — DX: Nausea with vomiting, unspecified: R11.2

## 2013-08-14 LAB — BASIC METABOLIC PANEL
BUN: 14 mg/dL (ref 6–23)
CO2: 21 mEq/L (ref 19–32)
Calcium: 10.6 mg/dL — ABNORMAL HIGH (ref 8.4–10.5)
Chloride: 105 mEq/L (ref 96–112)
Creatinine, Ser: 0.94 mg/dL (ref 0.50–1.10)
GFR calc Af Amer: 72 mL/min — ABNORMAL LOW (ref >90)
GFR calc non Af Amer: 62 mL/min — ABNORMAL LOW (ref >90)
Glucose, Bld: 150 mg/dL — ABNORMAL HIGH (ref 70–99)
Potassium: 5.5 mEq/L — ABNORMAL HIGH (ref 3.7–5.3)
Sodium: 141 mEq/L (ref 137–147)

## 2013-08-14 LAB — CBC
HCT: 42.3 % (ref 36.0–46.0)
Hemoglobin: 13.3 g/dL (ref 12.0–15.0)
MCH: 25.2 pg — ABNORMAL LOW (ref 26.0–34.0)
MCHC: 31.4 g/dL (ref 30.0–36.0)
MCV: 80.3 fL (ref 78.0–100.0)
Platelets: 289 10*3/uL (ref 150–400)
RBC: 5.27 MIL/uL — ABNORMAL HIGH (ref 3.87–5.11)
RDW: 15.4 % (ref 11.5–15.5)
WBC: 10.3 10*3/uL (ref 4.0–10.5)

## 2013-08-14 NOTE — Progress Notes (Signed)
Patient informed Nurse that she has a stress test in November 2013 at [facility], will request records. Dr. Dierdre Forth at [facility] is PCP. Patient denied having a cardiac cath or sleep study.

## 2013-08-14 NOTE — Pre-Procedure Instructions (Signed)
Faith Gaines  08/14/2013   Your procedure is scheduled on: Tuesday August 22, 2013 at 12:30 PM  Report to [facility] Entrance "A" [address] immediately after leaving the breast center.  Call this number if you have problems the morning of surgery: [phone]   Remember:   Do not eat food or drink liquids after midnight.   Take these medicines the morning of surgery with A SIP OF WATER: Hydrocodone (if needed), Nebivolol (Bystolic), Pantoprazole (Protonix),   STOP Aspirin and Fish Oil as directed and discussed with Dr. Barry Dienes and Dr. Kellie Simmering   STOP/ Do not take Aleve, Naproxen, Advil, Ibuprofen, Vitamin, Herbs, or Supplements starting today   Do not wear jewelry, make-up or nail polish.  Do not wear lotions, powders, or perfumes. You may wear deodorant.  Do not shave 48 hours prior to surgery. Men may shave face and neck.  Do not bring valuables to the hospital.  [facility] is not responsible for any belongings or valuables.               Contacts, dentures or bridgework may not be worn into surgery.  Leave suitcase in the car. After surgery it may be brought to your room.  For patients admitted to the hospital, discharge time is determined by your treatment team.               Patients discharged the day of surgery will not be allowed to drive home.  Name and phone number of your driver: Family/ Friend  Special Instructions: Shower using CHG 2 nights before surgery and the night before surgery.  If you shower the day of surgery use CHG.  Use special wash - you have one bottle of CHG for all showers.  You should use approximately 1/3 of the bottle for each shower.   Please read over the following fact sheets that you were given: Pain Booklet, Coughing and Deep Breathing and Surgical Site Infection Prevention

## 2013-08-15 NOTE — Progress Notes (Signed)
Anesthesia chart review: Patient is a 68 year old female scheduled for left needle localized partial mastectomy on 08/22/13 by Dr. Barry Dienes.  right CEA tomorrow by Dr. Kellie Simmering. History noted and includes HTN, HLD, DM2, right CEA on 06/29/13. Preoperative EKG, CXR, labs noted.  Most recent EKG and CXR noted in Epic.  Nuclear stress test on 06/15/12 [facility]) showed no ischemia, normal gated imaging, normal EF 79%.  Carotid duplex on 08/01/13 showed patent right CEA, patent LICA.  Preoperative labs noted.  K+ 5.5, Cr 0.94, glucose is 150.  H/H 13.3/42.3.  She is on Invokana which can cause hyperkalemia.  Will check an ISTAT on arrival to ensure K remains 6.0 or less.  If so, then anticipate that she can proceed as planned.  George Hugh [facility]/Anesthesiology Phone [phone] 08/15/2013 3:07 PM

## 2013-08-21 MED ORDER — CEFAZOLIN SODIUM-DEXTROSE 2-3 GM-% IV SOLR
2.0000 g | INTRAVENOUS | Status: AC
  Administered 2013-08-22: 2 g via INTRAVENOUS

## 2013-08-22 ENCOUNTER — Encounter (HOSPITAL_COMMUNITY): Payer: Self-pay | Admitting: General Surgery

## 2013-08-22 ENCOUNTER — Ambulatory Visit
Admission: RE | Admit: 2013-08-22 | Discharge: 2013-08-22 | Disposition: A | Discharge status: Home / Self Care | Payer: Medicare Other | Source: Ambulatory Visit | Attending: General Surgery | Admitting: General Surgery

## 2013-08-22 ENCOUNTER — Ambulatory Visit (HOSPITAL_COMMUNITY)
Admission: RE | Admit: 2013-08-22 | Discharge: 2013-08-22 | Disposition: A | Discharge status: Home / Self Care | Payer: Medicare Other | Source: Ambulatory Visit | Attending: General Surgery | Admitting: General Surgery

## 2013-08-22 ENCOUNTER — Encounter (HOSPITAL_COMMUNITY): Payer: Medicare Other | Admitting: Vascular Surgery

## 2013-08-22 ENCOUNTER — Encounter (HOSPITAL_COMMUNITY)
Admission: RE | Disposition: A | Discharge status: Home / Self Care | Payer: Self-pay | Source: Ambulatory Visit | Attending: General Surgery

## 2013-08-22 ENCOUNTER — Ambulatory Visit (HOSPITAL_COMMUNITY): Payer: Medicare Other | Admitting: Anesthesiology

## 2013-08-22 DIAGNOSIS — E785 Hyperlipidemia, unspecified: Secondary | ICD-10-CM | POA: Diagnosis not present

## 2013-08-22 DIAGNOSIS — I2510 Atherosclerotic heart disease of native coronary artery without angina pectoris: Secondary | ICD-10-CM | POA: Diagnosis not present

## 2013-08-22 DIAGNOSIS — Z87891 Personal history of nicotine dependence: Secondary | ICD-10-CM | POA: Insufficient documentation

## 2013-08-22 DIAGNOSIS — I509 Heart failure, unspecified: Secondary | ICD-10-CM | POA: Diagnosis not present

## 2013-08-22 DIAGNOSIS — K219 Gastro-esophageal reflux disease without esophagitis: Secondary | ICD-10-CM | POA: Diagnosis not present

## 2013-08-22 DIAGNOSIS — C50912 Malignant neoplasm of unspecified site of left female breast: Secondary | ICD-10-CM

## 2013-08-22 DIAGNOSIS — I252 Old myocardial infarction: Secondary | ICD-10-CM | POA: Diagnosis not present

## 2013-08-22 DIAGNOSIS — Z803 Family history of malignant neoplasm of breast: Secondary | ICD-10-CM | POA: Insufficient documentation

## 2013-08-22 DIAGNOSIS — Z8049 Family history of malignant neoplasm of other genital organs: Secondary | ICD-10-CM | POA: Insufficient documentation

## 2013-08-22 DIAGNOSIS — N189 Chronic kidney disease, unspecified: Secondary | ICD-10-CM | POA: Diagnosis not present

## 2013-08-22 DIAGNOSIS — I739 Peripheral vascular disease, unspecified: Secondary | ICD-10-CM | POA: Diagnosis not present

## 2013-08-22 DIAGNOSIS — Z85820 Personal history of malignant melanoma of skin: Secondary | ICD-10-CM | POA: Diagnosis not present

## 2013-08-22 DIAGNOSIS — E119 Type 2 diabetes mellitus without complications: Secondary | ICD-10-CM | POA: Insufficient documentation

## 2013-08-22 DIAGNOSIS — I10 Essential (primary) hypertension: Secondary | ICD-10-CM | POA: Diagnosis not present

## 2013-08-22 DIAGNOSIS — I129 Hypertensive chronic kidney disease with stage 1 through stage 4 chronic kidney disease, or unspecified chronic kidney disease: Secondary | ICD-10-CM | POA: Diagnosis not present

## 2013-08-22 DIAGNOSIS — D0590 Unspecified type of carcinoma in situ of unspecified breast: Secondary | ICD-10-CM | POA: Diagnosis not present

## 2013-08-22 HISTORY — PX: PARTIAL MASTECTOMY WITH NEEDLE LOCALIZATION: SHX6008

## 2013-08-22 LAB — POCT I-STAT 4, (NA,K, GLUC, HGB,HCT)
Glucose, Bld: 137 mg/dL — ABNORMAL HIGH (ref 70–99)
HCT: 41 % (ref 36.0–46.0)
Hemoglobin: 13.9 g/dL (ref 12.0–15.0)
Potassium: 4 mEq/L (ref 3.7–5.3)
Sodium: 142 mEq/L (ref 137–147)

## 2013-08-22 LAB — GLUCOSE, CAPILLARY: Glucose-Capillary: 105 mg/dL — ABNORMAL HIGH (ref 70–99)

## 2013-08-22 SURGERY — PARTIAL MASTECTOMY WITH NEEDLE LOCALIZATION
Anesthesia: General | Site: Breast | Laterality: Left

## 2013-08-22 MED ORDER — ROCURONIUM BROMIDE 50 MG/5ML IV SOLN
INTRAVENOUS | Status: AC
  Filled 2013-08-22: qty 1

## 2013-08-22 MED ORDER — 0.9 % SODIUM CHLORIDE (POUR BTL) OPTIME
TOPICAL | Status: DC | PRN
  Administered 2013-08-22: 1000 mL

## 2013-08-22 MED ORDER — LIDOCAINE HCL 1 % IJ SOLN
INTRAMUSCULAR | Status: DC | PRN
  Administered 2013-08-22 (×2): via INTRADERMAL

## 2013-08-22 MED ORDER — OXYCODONE HCL 5 MG PO TABS
ORAL_TABLET | ORAL | Status: AC
  Filled 2013-08-22: qty 1

## 2013-08-22 MED ORDER — DEXAMETHASONE SODIUM PHOSPHATE 10 MG/ML IJ SOLN
INTRAMUSCULAR | Status: DC | PRN
  Administered 2013-08-22: 4 mg via INTRAVENOUS

## 2013-08-22 MED ORDER — OXYCODONE-ACETAMINOPHEN 5-325 MG PO TABS
1.0000 | ORAL_TABLET | ORAL | Status: DC | PRN
  Administered 2013-08-22: 1 via ORAL

## 2013-08-22 MED ORDER — PROPOFOL 10 MG/ML IV BOLUS
INTRAVENOUS | Status: DC | PRN
  Administered 2013-08-22: 120 mg via INTRAVENOUS

## 2013-08-22 MED ORDER — LIDOCAINE HCL (CARDIAC) 20 MG/ML IV SOLN
INTRAVENOUS | Status: AC
  Filled 2013-08-22: qty 5

## 2013-08-22 MED ORDER — BUPIVACAINE-EPINEPHRINE PF 0.25-1:200000 % IJ SOLN
INTRAMUSCULAR | Status: AC
Start: 2013-08-22 — End: 2013-08-22
  Filled 2013-08-22: qty 30

## 2013-08-22 MED ORDER — PROMETHAZINE HCL 25 MG/ML IJ SOLN: 6.2500 mg | INTRAMUSCULAR | Status: DC | PRN

## 2013-08-22 MED ORDER — OXYCODONE-ACETAMINOPHEN 5-325 MG PO TABS: 1.0000 | ORAL_TABLET | ORAL | Status: DC | PRN

## 2013-08-22 MED ORDER — LACTATED RINGERS IV SOLN
INTRAVENOUS | Status: DC
  Administered 2013-08-22 (×2): via INTRAVENOUS

## 2013-08-22 MED ORDER — OXYCODONE HCL 5 MG PO TABS
5.0000 mg | ORAL_TABLET | Freq: Once | ORAL | Status: AC | PRN
  Administered 2013-08-22: 5 mg via ORAL

## 2013-08-22 MED ORDER — MIDAZOLAM HCL 2 MG/2ML IJ SOLN
INTRAMUSCULAR | Status: AC
  Filled 2013-08-22: qty 2

## 2013-08-22 MED ORDER — LIDOCAINE HCL (PF) 1 % IJ SOLN
INTRAMUSCULAR | Status: AC
  Filled 2013-08-22: qty 30

## 2013-08-22 MED ORDER — SODIUM CHLORIDE 0.9 % IV SOLN
INTRAVENOUS | Status: DC | PRN
  Administered 2013-08-22: 13:00:00 via INTRAVENOUS

## 2013-08-22 MED ORDER — HYDROMORPHONE HCL PF 1 MG/ML IJ SOLN
INTRAMUSCULAR | Status: AC
  Filled 2013-08-22: qty 1

## 2013-08-22 MED ORDER — FENTANYL CITRATE 0.05 MG/ML IJ SOLN
INTRAMUSCULAR | Status: DC | PRN
  Administered 2013-08-22 (×2): 25 ug via INTRAVENOUS

## 2013-08-22 MED ORDER — HYDROMORPHONE HCL PF 1 MG/ML IJ SOLN
0.2500 mg | INTRAMUSCULAR | Status: DC | PRN
  Administered 2013-08-22 (×3): 0.5 mg via INTRAVENOUS

## 2013-08-22 MED ORDER — OXYCODONE HCL 5 MG/5ML PO SOLN: 5.0000 mg | Freq: Once | ORAL | Status: AC | PRN

## 2013-08-22 MED ORDER — LIDOCAINE HCL (CARDIAC) 20 MG/ML IV SOLN
INTRAVENOUS | Status: DC | PRN
  Administered 2013-08-22: 60 mg via INTRAVENOUS

## 2013-08-22 MED ORDER — PROPOFOL 10 MG/ML IV BOLUS
INTRAVENOUS | Status: AC
  Filled 2013-08-22: qty 20

## 2013-08-22 MED ORDER — MIDAZOLAM HCL 5 MG/5ML IJ SOLN
INTRAMUSCULAR | Status: DC | PRN
  Administered 2013-08-22: 2 mg via INTRAVENOUS

## 2013-08-22 MED ORDER — ONDANSETRON HCL 4 MG/2ML IJ SOLN
INTRAMUSCULAR | Status: DC | PRN
  Administered 2013-08-22: 4 mg via INTRAVENOUS

## 2013-08-22 MED ORDER — FENTANYL CITRATE 0.05 MG/ML IJ SOLN
INTRAMUSCULAR | Status: AC
  Filled 2013-08-22: qty 5

## 2013-08-22 MED ORDER — ONDANSETRON HCL 4 MG/2ML IJ SOLN
INTRAMUSCULAR | Status: AC
  Filled 2013-08-22: qty 2

## 2013-08-22 MED ORDER — CHLORHEXIDINE GLUCONATE 4 % EX LIQD: 1.0000 "application " | Freq: Once | CUTANEOUS | Status: DC

## 2013-08-22 SURGICAL SUPPLY — 43 items
BINDER BREAST LRG (GAUZE/BANDAGES/DRESSINGS) ×1 IMPLANT
BINDER BREAST XLRG (GAUZE/BANDAGES/DRESSINGS) IMPLANT
BLADE SURG 10 STRL SS (BLADE) ×2 IMPLANT
BLADE SURG 15 STRL LF DISP TIS (BLADE) ×1 IMPLANT
BLADE SURG 15 STRL SS (BLADE) ×2
CANISTER SUCTION 2500CC (MISCELLANEOUS) ×2 IMPLANT
CHLORAPREP W/TINT 26ML (MISCELLANEOUS) ×2 IMPLANT
CLIP TI LARGE 6 (CLIP) ×2 IMPLANT
CLSR STERI-STRIP ANTIMIC 1/2X4 (GAUZE/BANDAGES/DRESSINGS) ×1 IMPLANT
COVER SURGICAL LIGHT HANDLE (MISCELLANEOUS) ×2 IMPLANT
DEVICE DUBIN SPECIMEN MAMMOGRA (MISCELLANEOUS) ×2 IMPLANT
DRAPE CHEST BREAST 15X10 FENES (DRAPES) ×2 IMPLANT
DRAPE UTILITY 15X26 W/TAPE STR (DRAPE) ×4 IMPLANT
ELECT CAUTERY BLADE 6.4 (BLADE) ×2 IMPLANT
ELECT REM PT RETURN 9FT ADLT (ELECTROSURGICAL) ×2
ELECTRODE REM PT RTRN 9FT ADLT (ELECTROSURGICAL) ×1 IMPLANT
GLOVE BIO SURGEON STRL SZ 6 (GLOVE) ×2 IMPLANT
GLOVE BIOGEL PI IND STRL 6.5 (GLOVE) ×1 IMPLANT
GLOVE BIOGEL PI INDICATOR 6.5 (GLOVE) ×1
GOWN STRL NON-REIN LRG LVL3 (GOWN DISPOSABLE) ×2 IMPLANT
GOWN STRL REUS W/TWL 2XL LVL3 (GOWN DISPOSABLE) ×2 IMPLANT
KIT BASIN OR (CUSTOM PROCEDURE TRAY) ×2 IMPLANT
KIT MARKER MARGIN INK (KITS) ×1 IMPLANT
KIT ROOM TURNOVER OR (KITS) ×2 IMPLANT
NDL HYPO 25GX1X1/2 BEV (NEEDLE) ×1 IMPLANT
NEEDLE HYPO 25GX1X1/2 BEV (NEEDLE) ×2 IMPLANT
NS IRRIG 1000ML POUR BTL (IV SOLUTION) ×2 IMPLANT
PACK SURGICAL SETUP 50X90 (CUSTOM PROCEDURE TRAY) ×2 IMPLANT
PAD ARMBOARD 7.5X6 YLW CONV (MISCELLANEOUS) ×2 IMPLANT
PENCIL BUTTON HOLSTER BLD 10FT (ELECTRODE) ×2 IMPLANT
SPONGE GAUZE 4X4 12PLY (GAUZE/BANDAGES/DRESSINGS) ×1 IMPLANT
SPONGE LAP 18X18 X RAY DECT (DISPOSABLE) ×2 IMPLANT
SUT MNCRL AB 4-0 PS2 18 (SUTURE) ×2 IMPLANT
SUT SILK 2 0 SH (SUTURE) IMPLANT
SUT VIC AB 2-0 SH 27 (SUTURE) ×2
SUT VIC AB 2-0 SH 27XBRD (SUTURE) ×1 IMPLANT
SUT VIC AB 3-0 SH 27 (SUTURE) ×2
SUT VIC AB 3-0 SH 27X BRD (SUTURE) ×1 IMPLANT
SYR CONTROL 10ML LL (SYRINGE) ×2 IMPLANT
TOWEL OR 17X24 6PK STRL BLUE (TOWEL DISPOSABLE) ×2 IMPLANT
TOWEL OR 17X26 10 PK STRL BLUE (TOWEL DISPOSABLE) ×2 IMPLANT
TUBE CONNECTING 12X1/4 (SUCTIONS) ×2 IMPLANT
YANKAUER SUCT BULB TIP NO VENT (SUCTIONS) ×2 IMPLANT

## 2013-08-22 NOTE — Anesthesia Preprocedure Evaluation (Signed)
Anesthesia Evaluation  Patient identified by MRN, date of birth, ID band Patient awake    Reviewed: Allergy & Precautions, H&P , NPO status , Patient's Chart, lab work & pertinent test results, reviewed documented beta blocker date and time   History of Anesthesia Complications Negative for: history of anesthetic complications  Airway Mallampati: III TM Distance: <3 FB Neck ROM: Full    Dental  (+) Teeth Intact   Pulmonary neg shortness of breath, neg COPDformer smoker,    Pulmonary exam normal       Cardiovascular Exercise Tolerance: Good hypertension, Pt. on medications and Pt. on home beta blockers - angina+ Peripheral Vascular Disease - CAD, - Past MI and - CHF Rhythm:Regular Rate:Normal     Neuro/Psych  Headaches,    GI/Hepatic negative GI ROS, Neg liver ROS,   Endo/Other  diabetes, Type 2, Oral Hypoglycemic Agents  Renal/GU CRFRenal disease  negative genitourinary   Musculoskeletal negative musculoskeletal ROS (+)   Abdominal   Peds  Hematology negative hematology ROS (+)   Anesthesia Other Findings   Reproductive/Obstetrics                           Anesthesia Physical Anesthesia Plan  ASA: II  Anesthesia Plan: General   Post-op Pain Management:    Induction: Intravenous  Airway Management Planned: LMA  Additional Equipment: None  Intra-op Plan:   Post-operative Plan: Extubation in OR  Informed Consent: I have reviewed the patients History and Physical, chart, labs and discussed the procedure including the risks, benefits and alternatives for the proposed anesthesia with the patient or authorized representative who has indicated his/her understanding and acceptance.   Dental advisory given  Plan Discussed with: CRNA and Surgeon  Anesthesia Plan Comments:         Anesthesia Quick Evaluation

## 2013-08-22 NOTE — Anesthesia Postprocedure Evaluation (Signed)
  Anesthesia Post-op Note  Patient: Faith Gaines  Procedure(s) Performed: Procedure(s): PARTIAL MASTECTOMY WITH NEEDLE LOCALIZATION (Left)  Patient Location: PACU  Anesthesia Type:General  Level of Consciousness: awake, alert  and oriented  Airway and Oxygen Therapy: Patient Spontanous Breathing  Post-op Pain: mild  Post-op Assessment: Post-op Vital signs reviewed, Patient's Cardiovascular Status Stable, Respiratory Function Stable, Patent Airway, No signs of Nausea or vomiting and Pain level controlled  Post-op Vital Signs: Reviewed and stable  Complications: No apparent anesthesia complications

## 2013-08-22 NOTE — Op Note (Signed)
Left Breast needle localized partial mastectomy  Indications: This patient presents with history of left breast cancer, cTis  Pre-operative Diagnosis: left breast cancer  Post-operative Diagnosis: left breast cancer  Surgeon: Stark Klein   Assistants: n/a  Anesthesia: General LMA anesthesia  ASA Class: 2  Procedure Details  The patient was seen in the Holding Room. The risks, benefits, complications, treatment options, and expected outcomes were discussed with the patient. The possibilities of reaction to medication, pulmonary aspiration, bleeding, infection, the need for additional procedures, failure to diagnose a condition, and creating a complication requiring transfusion or operation were discussed with the patient. The patient concurred with the proposed plan, giving informed consent.  The site of surgery properly noted/marked. The patient was taken to Operating Room # 9, identified as Faith Gaines and the procedure verified as left breast needle localized partial mastectomy. A Time Out was held and the above information confirmed.  After induction of anesthesia, the left arm, breast, and chest were prepped and draped in standard fashion.  The partial mastectomy was performed by creating an circumareolar incision over the lower outer quadrant of the breast around the previously placed localization guidewire.  Dissection was carried down to the pectoral fascia.  The specimen was inked with the margin marker paint kit.  Marland Kitchen  Specimen radiography confirmed inclusion of the mammographic lesion.  Hemostasis was achieved with cautery.  Additional tissue was taken along the medial margin and submitted separately to pathology after providing orientation for the pathology.  The wound was irrigated and closed with a 3-0 Vicryl deep dermal interrupted and a 4-0 Monocryl subcuticular closure in layers.    Sterile dressings were applied. At the end of the operation, all sponge, instrument, and needle  counts were correct.  Findings: grossly clear surgical margins, posterior margin is pectoralis fascia  Estimated Blood Loss:  Minimal         Drains: n/a         Specimens: left needle localized partial mastectomy and additional medial margin                Complications:  None; patient tolerated the procedure well.         Disposition: PACU - hemodynamically stable.         Condition: stable

## 2013-08-22 NOTE — Transfer of Care (Signed)
Immediate Anesthesia Transfer of Care Note  Patient: Faith Gaines  Procedure(s) Performed: Procedure(s): PARTIAL MASTECTOMY WITH NEEDLE LOCALIZATION (Left)  Patient Location: PACU  Anesthesia Type:General  Level of Consciousness: awake, alert , oriented and patient cooperative  Airway & Oxygen Therapy: Patient Spontanous Breathing and Patient connected to nasal cannula oxygen  Post-op Assessment: Report given to PACU RN, Post -op Vital signs reviewed and stable and Patient moving all extremities  Post vital signs: Reviewed and stable  Complications: No apparent anesthesia complications

## 2013-08-22 NOTE — H&P (Signed)
Chief Complaint   Patient presents with   .  New Evaluation       eval new br cancer    HISTORY: Patient is a 68 year old female who presents with a new diagnosis of left breast DCIS. She had her normal screening mammograms and received a letter advising her that she had very dense breasts. She subsequently underwent MRI and was found to have a 5 mm area of concern in her right breast and a 7 mm area of concern in her left breast. She subsequently underwent ultrasound without being able to successfully biopsy the mass. 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 She has not palpated any masses. She denies any nipple discharge or nipple retraction. She has not had any breast pain other than after the right-sided biopsy with hematoma.  She does have a significant family history of her mother having bilateral breast cancer as well as uterine cancer. The patient has a personal history of melanoma. She has required multiple pre-melanoma excisions from her dermatologist.     Past Medical History   Diagnosis  Date   .  Cancer         breast   .  Diabetes mellitus without complication     .  Hyperlipidemia     .  Hypertension     .  Chronic kidney disease       Past Surgical History   Procedure  Laterality  Date   .  Melanoma excision    12/1980       left leg   .  Bladder surgery    05/1981       bladder tack   .  Cholecystectomy    1995   .  Squamous cell carcinoma excision    2012         Current Outpatient Prescriptions   Medication  Sig  Dispense  Refill   .  Canagliflozin (INVOKANA) 300 MG TABS  Take 300 mg by mouth.         .  Fish Oil-Cholecalciferol (FISH OIL + D3) 1000-1000 MG-UNIT CAPS  Take by mouth.         .  nebivolol (BYSTOLIC) 10 MG tablet  Take 10 mg by mouth daily.         .  nebivolol (BYSTOLIC) 5 MG tablet  Take 5 mg by mouth daily.         .  pantoprazole (PROTONIX) 40 MG tablet  Take 40 mg by mouth daily.          .  Saxagliptin-Metformin (KOMBIGLYZE XR) 2.11-998 MG TB24  Take by mouth.         No current facility-administered medications for this visit.        Allergies   Allergen  Reactions   .  Codeine  Nausea And Vomiting and Other (See Comments)       Headache and vomiting     Family History   Problem  Relation  Age of Onset   .  Cancer  Mother         breast, uterus, lung   .  Heart disease  Father      History       Social History   .  Marital Status:  Widowed       Spouse Name:  N/A       Number of Children:  N/A   .  Years of Education:  N/A       Social History Main Topics   .  Smoking status:  Former Smoker       Quit date:  07/20/2010   .  Smokeless tobacco:  Never Used   .  Alcohol Use:  No   .  Drug Use:  No   .  Sexual Activity:  None    REVIEW OF SYSTEMS - PERTINENT POSITIVES ONLY: 12 point review of systems negative other than HPI and PMH except for diarrhea, weakness  EXAM: Filed Vitals:     06/23/13 1018   BP:  118/78   Pulse:  64   Temp:  98.6 F (37 C)   Resp:  14      Filed Weights     06/23/13 1018   Weight:  125 lb 6.4 oz (56.881 kg)    Gen:  No acute distress.  Well nourished and well groomed.   Neurological: Alert and oriented to person, place, and time. Coordination normal.  Head: Normocephalic and atraumatic.   Eyes: Conjunctivae are normal. Pupils are equal, round, and reactive to light. No scleral icterus.  Cardiovascular: Normal rate, regular rhythm Respiratory: Effort normal.  No respiratory distress.   Breast:  Right breast with biopsy site at 9:30.  Left breast soft.  No palpable masses or skin dimpling.  No nipple retraction.  No nipple discharge.  No LAD.    Skin: Skin is warm and dry. No rash noted. No diaphoresis. No erythema. No pallor. No clubbing, cyanosis, or edema.   Psychiatric: Normal mood and affect. Behavior is normal. Judgment and thought content normal.      LABORATORY RESULTS: Available labs are reviewed    Pathology Diagnosis 1. Breast, right, needle core biopsy, upper outer quadrant - FIBROCYSTIC CHANGES WITH USUAL DUCTAL HYPERPLASIA. - BENIGN DUCTS WITH ASSOCIATED MICROCALCIFICATION. - NO ATYPIA OR MALIGNANCY IDENTIFIED. 2. Breast, left, needle core biopsy, mass, outer central - DUCTAL CARCINOMA IN SITU. - SEE COMMENT.   RADIOLOGY RESULTS: See E-Chart or I-Site for most recent results.  Images and reports are reviewed. MRI IMPRESSION:   1. An indeterminate 7 mm mass at 3 o'clock in the left breast.   2. A 5 mm dominant focus in the upper-outer right breast.       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 She is a good candidate for breast conservation.   She will need a left needle localized partial mastectomy. She got Carotid endarterectomy completed.  . The surgical procedure was described to the patient.  I discussed the incision type and location and whether we would need radiology involved on the day of surgery with a wire marker.       The risks and benefits of the procedure were described to the patient and he/she wishes to proceed.     We discussed the risks bleeding, infection, damage to other structures, need for further procedures/surgeries.  We discussed the risk of seroma.  The patient was advised if the area in the breast contains invasive cancer, we may need to go back to surgery for additional tissue to obtain negative margins or for a lymph node biopsy. The patient was advised that these are the most common complications, but that others can occur as well.      Milus Height MD Surgical Oncology, General and Endocrine Surgery [facility]  Visit Diagnoses: 1.  Breast cancer of upper-outer quadrant of left female breast, Tis, 3 oclock      Primary Care Physician: Vena Austria, MD   Kathryne Eriksson.

## 2013-08-22 NOTE — Discharge Instructions (Signed)
[facility] °Office Phone Number [phone] ° °BREAST BIOPSY/ PARTIAL MASTECTOMY: POST OP INSTRUCTIONS ° °Always review your discharge instruction sheet given to you by the facility where your surgery was performed. ° °IF YOU HAVE DISABILITY OR FAMILY LEAVE FORMS, YOU MUST BRING THEM TO THE OFFICE FOR PROCESSING.  DO NOT GIVE THEM TO YOUR DOCTOR. ° °1. A prescription for pain medication may be given to you upon discharge.  Take your pain medication as prescribed, if needed.  If narcotic pain medicine is not needed, then you may take acetaminophen (Tylenol) or ibuprofen (Advil) as needed. °2. Take your usually prescribed medications unless otherwise directed °3. If you need a refill on your pain medication, please contact your pharmacy.  They will contact our office to request authorization.  Prescriptions will not be filled after 5pm or on week-ends. °4. You should eat very light the first 24 hours after surgery, such as soup, crackers, pudding, etc.  Resume your normal diet the day after surgery. °5. Most patients will experience some swelling and bruising in the breast.  Ice packs and a good support bra will help.  Swelling and bruising can take several days to resolve.  °6. It is common to experience some constipation if taking pain medication after surgery.  Increasing fluid intake and taking a stool softener will usually help or prevent this problem from occurring.  A mild laxative (Milk of Magnesia or Miralax) should be taken according to package directions if there are no bowel movements after 48 hours. °7. Unless discharge instructions indicate otherwise, you may remove your bandages 48 hours after surgery, and you may shower at that time.  You may have steri-strips (small skin tapes) in place directly over the incision.  These strips should be left on the skin for 7-10 days.   Any sutures or staples will be removed at the office during your follow-up visit. °8. ACTIVITIES:  You may resume  regular daily activities (gradually increasing) beginning the next day.  Wearing a good support bra or sports bra (or the breast binder) minimizes pain and swelling.  You may have sexual intercourse when it is comfortable. °a. You may drive when you no longer are taking prescription pain medication, you can comfortably wear a seatbelt, and you can safely maneuver your car and apply brakes. °b. RETURN TO WORK:  __________1 week_______________ °9. You should see your doctor in the office for a follow-up appointment approximately two weeks after your surgery.  Your doctor’s nurse will typically make your follow-up appointment when she calls you with your pathology report.  Expect your pathology report 2-3 business days after your surgery.  You may call to check if you do not hear from us after three days. ° ° °WHEN TO CALL YOUR DOCTOR: °1. Fever over 101.0 °2. Nausea and/or vomiting. °3. Extreme swelling or bruising. °4. Continued bleeding from incision. °5. Increased pain, redness, or drainage from the incision. ° °The clinic staff is available to answer your questions during regular business hours.  Please don’t hesitate to call and ask to speak to one of the nurses for clinical concerns.  If you have a medical emergency, go to the nearest emergency room or call 911.  A surgeon from [facility] is always on call at the hospital. ° °For further questions, please visit [email or web address]  ° °

## 2013-08-22 NOTE — Addendum Note (Signed)
Addendum created 08/22/13 1820 by Laretta Alstrom, CRNA   Modules edited: Anesthesia Medication Administration

## 2013-08-25 ENCOUNTER — Telehealth (INDEPENDENT_AMBULATORY_CARE_PROVIDER_SITE_OTHER): Payer: Self-pay

## 2013-08-25 NOTE — Telephone Encounter (Signed)
Pt made aware pathology showed clear margins and no sign of any cancer.

## 2013-08-28 ENCOUNTER — Encounter (HOSPITAL_COMMUNITY): Payer: Self-pay | Admitting: General Surgery

## 2013-09-05 ENCOUNTER — Encounter (INDEPENDENT_AMBULATORY_CARE_PROVIDER_SITE_OTHER): Payer: Medicare Other | Admitting: General Surgery

## 2013-09-08 DIAGNOSIS — C4370 Malignant melanoma of unspecified lower limb, including hip: Secondary | ICD-10-CM | POA: Diagnosis not present

## 2013-09-08 DIAGNOSIS — M949 Disorder of cartilage, unspecified: Secondary | ICD-10-CM | POA: Diagnosis not present

## 2013-09-08 DIAGNOSIS — D0590 Unspecified type of carcinoma in situ of unspecified breast: Secondary | ICD-10-CM | POA: Diagnosis not present

## 2013-09-08 DIAGNOSIS — M899 Disorder of bone, unspecified: Secondary | ICD-10-CM | POA: Diagnosis not present

## 2013-09-11 ENCOUNTER — Ambulatory Visit (INDEPENDENT_AMBULATORY_CARE_PROVIDER_SITE_OTHER): Payer: Medicare Other | Admitting: General Surgery

## 2013-09-11 ENCOUNTER — Encounter (INDEPENDENT_AMBULATORY_CARE_PROVIDER_SITE_OTHER): Payer: Self-pay | Admitting: General Surgery

## 2013-09-11 VITALS — BP 128/70 | HR 76 | Temp 98.6°F | Resp 14 | Ht 61.5 in | Wt 125.8 lb

## 2013-09-11 DIAGNOSIS — C50412 Malignant neoplasm of upper-outer quadrant of left female breast: Secondary | ICD-10-CM

## 2013-09-11 DIAGNOSIS — C50419 Malignant neoplasm of upper-outer quadrant of unspecified female breast: Secondary | ICD-10-CM

## 2013-09-11 MED ORDER — OXYCODONE-ACETAMINOPHEN 5-325 MG PO TABS: 1.0000 | ORAL_TABLET | ORAL | Status: DC | PRN

## 2013-09-11 MED ORDER — TRAMADOL HCL 50 MG PO TABS: 50.0000 mg | ORAL_TABLET | Freq: Four times a day (QID) | ORAL | Status: DC | PRN

## 2013-09-11 NOTE — Patient Instructions (Signed)
Try taking tramadol for pain.    Refilled oxycodone, try to wean down.    Follow up in 2 weeks.

## 2013-09-11 NOTE — Assessment & Plan Note (Signed)
Patient continues to have pain. She is given a refill of oxycodone and a prescription for tramadol to take during the day.  She appears to have a small hematoma which I do not think would be amenable to aspiration. There is no evidence of infection. I will see her back in approximately 2 weeks to reexamine.

## 2013-09-11 NOTE — Progress Notes (Signed)
HISTORY: The patient is approximately 2 weeks status post left needle localized lumpectomy for a DCIS. She is still having significant soreness. She is still unable to sleep without her bra on. She is almost out of her oxycodone. She denies fevers and chills. She is unable to take nonsteroidal anti-inflammatories because of chronic kidney disease.    EXAM: General:  Alert and oriented.   Incision:  Healing well.  No erythema, no warmth.  Breast not tense, no skin edema.  Small hematoma   PATHOLOGY: Diagnosis 1. 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 - THE SURGICAL RESECTION MARGINS ARE NEGATIVE FOR CARCINOMA. - SEE ONCOLOGY TABLE BELOW. 2. Breast, excision, Left additional margin - BENIGN BREAST PARENCHYMA. - THERE IS NO EVIDENCE OF MALIGNANCY. - SEE COMMENT.   ASSESSMENT AND PLAN:   Breast cancer of upper-outer quadrant of left female breast, Tis, 3 oclock Patient continues to have pain. She is given a refill of oxycodone and a prescription for tramadol to take during the day.  She appears to have a small hematoma which I do not think would be amenable to aspiration. There is no evidence of infection. I will see her back in approximately 2 weeks to reexamine.      Milus Height, MD Surgical Oncology, [facility]  Vena Austria, MD Maury Dus, MD

## 2013-09-20 ENCOUNTER — Encounter (INDEPENDENT_AMBULATORY_CARE_PROVIDER_SITE_OTHER): Payer: Self-pay | Admitting: General Surgery

## 2013-09-20 ENCOUNTER — Ambulatory Visit (INDEPENDENT_AMBULATORY_CARE_PROVIDER_SITE_OTHER): Payer: Medicare Other | Admitting: General Surgery

## 2013-09-20 VITALS — BP 122/71 | HR 78 | Temp 98.6°F | Resp 18 | Ht 61.0 in | Wt 125.6 lb

## 2013-09-20 DIAGNOSIS — C50419 Malignant neoplasm of upper-outer quadrant of unspecified female breast: Secondary | ICD-10-CM

## 2013-09-20 DIAGNOSIS — C50412 Malignant neoplasm of upper-outer quadrant of left female breast: Secondary | ICD-10-CM

## 2013-09-20 DIAGNOSIS — N644 Mastodynia: Secondary | ICD-10-CM

## 2013-09-20 NOTE — Patient Instructions (Addendum)
Try to use a warm compress and tramadol for the pain.  Follow up with Dr Barry Dienes.

## 2013-09-20 NOTE — Progress Notes (Signed)
Chief Complaint  Patient presents with  . Breast Problem    HISTORY:  Faith Gaines is a 68 y.o. female who presents to clinic with with breast pain after lumpectomy.  She also reports swelling.  This has been getting worse over the last couple weeks. She has been using ice and tramadol for the pain. She denies any fevers or erythema around the wound. She denies any drainage.  Past Medical History  Diagnosis Date  . Diabetes mellitus without complication   . Hyperlipidemia   . Hypertension   . Chronic kidney disease   . Carotid bruit   . Trigger finger   . Osteopenia   . Peripheral vascular disease     rt carotid stenosis  . GERD (gastroesophageal reflux disease)     occ  . Cancer     breast lft to have surgery, melanoma  . Anemia   . Carotid artery occlusion   . PONV (postoperative nausea and vomiting)   . Headache(784.0)   . Urgency of urination     urgency and frequency; loss of control at times  . Diarrhea     sees Dr. Collene Mares  . Pneumonia     hx x2 2013       Past Surgical History  Procedure Laterality Date  . Melanoma excision  12/1980    left leg,  . Bladder surgery  05/1981    bladder tack  . Cholecystectomy  1995  . Squamous cell carcinoma excision  2012  . Endarterectomy Right 06/29/2013    Procedure: ENDARTERECTOMY CAROTID-RIGHT, WITH DACRON PATCH ANGIOPLASTY;  Surgeon: Mal Misty, MD;  Location: [facility];  Service: Vascular;  Laterality: Right;  . Appendectomy    . Tubal ligation    . Colonoscopy    . Partial mastectomy with needle localization Left 08/22/2013    Procedure: PARTIAL MASTECTOMY WITH NEEDLE LOCALIZATION;  Surgeon: Stark Klein, MD;  Location: [facility];  Service: General;  Laterality: Left;      Current Outpatient Prescriptions  Medication Sig Dispense Refill  . aspirin EC 81 MG tablet Take 81 mg by mouth daily.      . Canagliflozin (INVOKANA) 300 MG TABS Take 300 mg by mouth daily.       . Diaper Rash Products (A+D DIAPER RASH) CREA Apply 1  application topically 2 (two) times daily. Apply to neck      . nebivolol (BYSTOLIC) 10 MG tablet Take 10 mg by mouth daily. Take 10 mg and 5 mg tablets for a 15 mg dose      . nebivolol (BYSTOLIC) 5 MG tablet Take 5 mg by mouth daily. Take 5 mg and 10 mg tablets for a 15 mg dose      . Omega 3 1200 MG CAPS Take 1,200 mg by mouth daily.       Marland Kitchen oxyCODONE-acetaminophen (ROXICET) 5-325 MG per tablet Take 1-2 tablets by mouth every 4 (four) hours as needed for severe pain.  30 tablet  0  . pantoprazole (PROTONIX) 40 MG tablet Take 40 mg by mouth daily.      . Saxagliptin-Metformin (KOMBIGLYZE XR) 2.11-998 MG TB24 Take 1 tablet by mouth 2 (two) times daily.       . traMADol (ULTRAM) 50 MG tablet Take 1-2 tablets (50-100 mg total) by mouth every 6 (six) hours as needed.  30 tablet  1   No current facility-administered medications for this visit.     Allergies  Allergen Reactions  . Codeine Nausea And  Vomiting and Other (See Comments)    Headache and vomiting  . Ace Inhibitors     Drop in GFR  . Clarithromycin     Unable to sleep   . Crestor [Rosuvastatin]     Body aches  . Lipitor [Atorvastatin]     Body aches  . Avandamet [Rosiglitazone-Metformin] Rash      Family History  Problem Relation Age of Onset  . Cancer Mother     breast, uterus, lung  . Heart attack Mother   . Stroke Mother   . Heart disease Mother   . Hypertension Mother   . Varicose Veins Mother   . Heart disease Father   . Heart attack Father   . Kidney disease Father   . Hypertension Father   . Diabetes Father   . Cancer Father   . Heart attack Sister   . Heart disease Sister       History   Social History  . Marital Status: Widowed    Spouse Name: N/A    Number of Children: N/A  . Years of Education: N/A   Social History Main Topics  . Smoking status: Former Smoker -- 0.25 packs/day for 2 years    Types: Cigarettes    Quit date: 07/20/2010  . Smokeless tobacco: Never Used  . Alcohol Use: No  .  Drug Use: No  . Sexual Activity: None   Other Topics Concern  . None   Social History Narrative  . None       REVIEW OF SYSTEMS - PERTINENT POSITIVES ONLY: Review of Systems - General ROS: negative for - chills or fever Breast ROS: negative for - no drainage Musculoskeletal ROS: negative for - joint pain or joint stiffness  EXAM: Filed Vitals:   09/20/13 1621  BP: 122/71  Pulse: 78  Temp: 98.6 F (37 C)  Resp: 18    General appearance: alert and cooperative Resp: clear to auscultation bilaterally Cardio: regular rate and rhythm GI: normal findings: soft, non-tender left breast with palpable seroma cavity. Slightly tender to palpation. No surrounding edema or erythema. Scar is healing well.     ASSESSMENT AND PLAN: Faith Gaines Is a 68 year old female who presents to the office with a increase in left breast pain over the past couple weeks. She is status post lumpectomy with Dr. Barry Dienes. Her exam is not concerning for an abscess. I have encouraged her to continue using her tramadol and warm compresses for pain digital. She is set to see Dr. Barry Dienes on Monday for further care.    [name], MD Colon and Rectal Surgery / [facility]      Visit Diagnoses: 1. Pain in breast   2. Breast cancer of upper-outer quadrant of left female breast, Tis, 3 oclock     Primary Care Physician: Vena Austria, MD

## 2013-09-22 ENCOUNTER — Ambulatory Visit (INDEPENDENT_AMBULATORY_CARE_PROVIDER_SITE_OTHER): Payer: Medicare Other | Admitting: General Surgery

## 2013-09-22 VITALS — BP 124/70 | HR 74 | Temp 98.7°F | Resp 14 | Ht 61.0 in | Wt 123.0 lb

## 2013-09-22 DIAGNOSIS — C50412 Malignant neoplasm of upper-outer quadrant of left female breast: Secondary | ICD-10-CM

## 2013-09-22 DIAGNOSIS — C50419 Malignant neoplasm of upper-outer quadrant of unspecified female breast: Secondary | ICD-10-CM

## 2013-09-22 NOTE — Assessment & Plan Note (Signed)
Around 35 mL bloody drainage aspirated.    Follow up in 2 weeks.  Advised to wean off pain meds as best as possible.

## 2013-09-22 NOTE — Patient Instructions (Signed)
Continue to use heat.  Try to wean off pain meds.  Follow up in 2 weeks.

## 2013-09-22 NOTE — Progress Notes (Signed)
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 Soreness is very slowly improving, but still taking percocet occasionally at night.  Complains of nipple soreness and chest wall soreness    EXAM: General:  Alert and oriented.   Incision:  Healing well.  No erythema, no warmth.  Breast not tense, no skin edema.  More liquid seroma.     PATHOLOGY: Diagnosis 1. 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 - THE SURGICAL RESECTION MARGINS ARE NEGATIVE FOR CARCINOMA. - SEE ONCOLOGY TABLE BELOW. 2. Breast, excision, Left additional margin - BENIGN BREAST PARENCHYMA. - THERE IS NO EVIDENCE OF MALIGNANCY. - SEE COMMENT.   ASSESSMENT AND PLAN:   Breast cancer of upper-outer quadrant of left female breast, Tis, 3 oclock Around 35 mL bloody drainage aspirated.    Follow up in 2 weeks.  Advised to wean off pain meds as best as possible.         Milus Height, MD Surgical Oncology, [facility]  Vena Austria, MD Maury Dus, MD

## 2013-09-25 ENCOUNTER — Encounter (INDEPENDENT_AMBULATORY_CARE_PROVIDER_SITE_OTHER): Payer: Medicare Other | Admitting: General Surgery

## 2013-10-06 ENCOUNTER — Ambulatory Visit (INDEPENDENT_AMBULATORY_CARE_PROVIDER_SITE_OTHER): Payer: Medicare Other | Admitting: General Surgery

## 2013-10-06 ENCOUNTER — Encounter (INDEPENDENT_AMBULATORY_CARE_PROVIDER_SITE_OTHER): Payer: Self-pay | Admitting: General Surgery

## 2013-10-06 VITALS — BP 126/74 | Temp 97.8°F | Resp 14 | Ht 63.0 in | Wt 121.0 lb

## 2013-10-06 DIAGNOSIS — C50419 Malignant neoplasm of upper-outer quadrant of unspecified female breast: Secondary | ICD-10-CM | POA: Diagnosis not present

## 2013-10-06 DIAGNOSIS — C50412 Malignant neoplasm of upper-outer quadrant of left female breast: Secondary | ICD-10-CM

## 2013-10-06 NOTE — Patient Instructions (Addendum)
Follow up with me in August.

## 2013-10-06 NOTE — Assessment & Plan Note (Signed)
No residual seroma.  Follow up in 5 months.  Will get on every 6 month schedule at that time.    Continue tylenol/tramadol for discomfort.

## 2013-10-06 NOTE — Progress Notes (Signed)
HISTORY: Pt is around 3-4 months s/p left lumpectomy for DCIS.  She continues to have soreness.  She is not having fevers/chills.  She has not noted the heavy feeling that she was having before.  She is still wearing the sports bra at night for support.  She is continuing to take tramadol and very seldom her narcotic pain medicine.  She has been trying ice.     PERTINENT REVIEW OF SYSTEMS: R neck (site of CEA) and left arm pain.    Filed Vitals:   10/06/13 1135  BP: 126/74  Temp: 97.8 F (36.6 C)  Resp: 14   Wt Readings from Last 3 Encounters:  10/06/13 121 lb (54.885 kg)  09/22/13 123 lb (55.792 kg)  09/20/13 125 lb 9.6 oz (56.972 kg)    EXAM: Head: Normocephalic and atraumatic.  Eyes:  Conjunctivae are normal. Pupils are equal, round, and reactive to light. No scleral icterus.  Neck:  Normal range of motion. Neck supple. No tracheal deviation present. No thyromegaly present.  Resp: No respiratory distress, normal effort. Breast:  Left breast without any significant seroma.  No cellulitis.  Nipple not inverted.  Right breast OK.   Abd:  Abdomen is soft, non distended and non tender. No masses are palpable.  There is no rebound and no guarding.  Neurological: Alert and oriented to person, place, and time. Coordination normal.  Skin: Skin is warm and dry. No rash noted. No diaphoretic. No erythema. No pallor.  Psychiatric: Normal mood and affect. Normal behavior. Judgment and thought content normal.      ASSESSMENT AND PLAN:   Breast cancer of upper-outer quadrant of left female breast, Tis, 3 oclock No residual seroma.  Follow up in 5 months.  Will get on every 6 month schedule at that time.    Continue tylenol/tramadol for discomfort.        Milus Height, MD Surgical Oncology, [facility]  Vena Austria, MD Maury Dus, MD

## 2013-10-26 ENCOUNTER — Other Ambulatory Visit: Payer: Self-pay | Admitting: Dermatology

## 2013-10-26 DIAGNOSIS — Z85828 Personal history of other malignant neoplasm of skin: Secondary | ICD-10-CM | POA: Diagnosis not present

## 2013-10-26 DIAGNOSIS — L821 Other seborrheic keratosis: Secondary | ICD-10-CM | POA: Diagnosis not present

## 2013-10-26 DIAGNOSIS — D485 Neoplasm of uncertain behavior of skin: Secondary | ICD-10-CM | POA: Diagnosis not present

## 2013-10-26 DIAGNOSIS — Z85820 Personal history of malignant melanoma of skin: Secondary | ICD-10-CM | POA: Diagnosis not present

## 2013-10-26 DIAGNOSIS — D239 Other benign neoplasm of skin, unspecified: Secondary | ICD-10-CM | POA: Diagnosis not present

## 2013-10-26 DIAGNOSIS — D235 Other benign neoplasm of skin of trunk: Secondary | ICD-10-CM | POA: Diagnosis not present

## 2013-12-14 DIAGNOSIS — R1084 Generalized abdominal pain: Secondary | ICD-10-CM | POA: Diagnosis not present

## 2013-12-14 DIAGNOSIS — K625 Hemorrhage of anus and rectum: Secondary | ICD-10-CM | POA: Diagnosis not present

## 2013-12-14 DIAGNOSIS — D509 Iron deficiency anemia, unspecified: Secondary | ICD-10-CM | POA: Diagnosis not present

## 2013-12-14 DIAGNOSIS — R141 Gas pain: Secondary | ICD-10-CM | POA: Diagnosis not present

## 2013-12-14 DIAGNOSIS — R143 Flatulence: Secondary | ICD-10-CM | POA: Diagnosis not present

## 2013-12-14 DIAGNOSIS — R198 Other specified symptoms and signs involving the digestive system and abdomen: Secondary | ICD-10-CM | POA: Diagnosis not present

## 2013-12-14 DIAGNOSIS — K648 Other hemorrhoids: Secondary | ICD-10-CM | POA: Diagnosis not present

## 2013-12-20 DIAGNOSIS — R197 Diarrhea, unspecified: Secondary | ICD-10-CM | POA: Diagnosis not present

## 2013-12-20 DIAGNOSIS — Z85820 Personal history of malignant melanoma of skin: Secondary | ICD-10-CM | POA: Diagnosis not present

## 2013-12-20 DIAGNOSIS — Z853 Personal history of malignant neoplasm of breast: Secondary | ICD-10-CM | POA: Diagnosis not present

## 2013-12-20 DIAGNOSIS — R109 Unspecified abdominal pain: Secondary | ICD-10-CM | POA: Diagnosis not present

## 2013-12-20 DIAGNOSIS — Z09 Encounter for follow-up examination after completed treatment for conditions other than malignant neoplasm: Secondary | ICD-10-CM | POA: Diagnosis not present

## 2013-12-20 DIAGNOSIS — M949 Disorder of cartilage, unspecified: Secondary | ICD-10-CM | POA: Diagnosis not present

## 2013-12-20 DIAGNOSIS — D509 Iron deficiency anemia, unspecified: Secondary | ICD-10-CM | POA: Diagnosis not present

## 2013-12-20 DIAGNOSIS — M899 Disorder of bone, unspecified: Secondary | ICD-10-CM | POA: Diagnosis not present

## 2013-12-26 DIAGNOSIS — D509 Iron deficiency anemia, unspecified: Secondary | ICD-10-CM | POA: Diagnosis not present

## 2013-12-26 DIAGNOSIS — I10 Essential (primary) hypertension: Secondary | ICD-10-CM | POA: Diagnosis not present

## 2013-12-26 DIAGNOSIS — E1129 Type 2 diabetes mellitus with other diabetic kidney complication: Secondary | ICD-10-CM | POA: Diagnosis not present

## 2013-12-26 DIAGNOSIS — E785 Hyperlipidemia, unspecified: Secondary | ICD-10-CM | POA: Diagnosis not present

## 2013-12-26 DIAGNOSIS — N183 Chronic kidney disease, stage 3 unspecified: Secondary | ICD-10-CM | POA: Diagnosis not present

## 2014-01-02 DIAGNOSIS — R141 Gas pain: Secondary | ICD-10-CM | POA: Diagnosis not present

## 2014-01-02 DIAGNOSIS — D509 Iron deficiency anemia, unspecified: Secondary | ICD-10-CM | POA: Diagnosis not present

## 2014-01-02 DIAGNOSIS — R142 Eructation: Secondary | ICD-10-CM | POA: Diagnosis not present

## 2014-01-02 DIAGNOSIS — R198 Other specified symptoms and signs involving the digestive system and abdomen: Secondary | ICD-10-CM | POA: Diagnosis not present

## 2014-01-02 DIAGNOSIS — R1084 Generalized abdominal pain: Secondary | ICD-10-CM | POA: Diagnosis not present

## 2014-01-02 DIAGNOSIS — R143 Flatulence: Secondary | ICD-10-CM | POA: Diagnosis not present

## 2014-01-16 ENCOUNTER — Ambulatory Visit (INDEPENDENT_AMBULATORY_CARE_PROVIDER_SITE_OTHER): Payer: Medicare Other | Admitting: Critical Care Medicine

## 2014-01-16 ENCOUNTER — Encounter: Payer: Self-pay | Admitting: Critical Care Medicine

## 2014-01-16 VITALS — BP 136/70 | HR 71 | Temp 97.9°F | Ht 61.0 in | Wt 125.8 lb

## 2014-01-16 DIAGNOSIS — R0602 Shortness of breath: Secondary | ICD-10-CM | POA: Diagnosis not present

## 2014-01-16 DIAGNOSIS — R0600 Dyspnea, unspecified: Secondary | ICD-10-CM | POA: Insufficient documentation

## 2014-01-16 DIAGNOSIS — M8580 Other specified disorders of bone density and structure, unspecified site: Secondary | ICD-10-CM | POA: Insufficient documentation

## 2014-01-16 DIAGNOSIS — K219 Gastro-esophageal reflux disease without esophagitis: Secondary | ICD-10-CM | POA: Insufficient documentation

## 2014-01-16 DIAGNOSIS — E785 Hyperlipidemia, unspecified: Secondary | ICD-10-CM | POA: Insufficient documentation

## 2014-01-16 DIAGNOSIS — Z9889 Other specified postprocedural states: Secondary | ICD-10-CM | POA: Insufficient documentation

## 2014-01-16 DIAGNOSIS — E1129 Type 2 diabetes mellitus with other diabetic kidney complication: Secondary | ICD-10-CM | POA: Insufficient documentation

## 2014-01-16 DIAGNOSIS — R0989 Other specified symptoms and signs involving the circulatory and respiratory systems: Secondary | ICD-10-CM | POA: Insufficient documentation

## 2014-01-16 DIAGNOSIS — R197 Diarrhea, unspecified: Secondary | ICD-10-CM | POA: Insufficient documentation

## 2014-01-16 DIAGNOSIS — Z85820 Personal history of malignant melanoma of skin: Secondary | ICD-10-CM | POA: Insufficient documentation

## 2014-01-16 DIAGNOSIS — I10 Essential (primary) hypertension: Secondary | ICD-10-CM | POA: Insufficient documentation

## 2014-01-16 DIAGNOSIS — E1151 Type 2 diabetes mellitus with diabetic peripheral angiopathy without gangrene: Secondary | ICD-10-CM | POA: Insufficient documentation

## 2014-01-16 DIAGNOSIS — I739 Peripheral vascular disease, unspecified: Secondary | ICD-10-CM | POA: Insufficient documentation

## 2014-01-16 DIAGNOSIS — R0609 Other forms of dyspnea: Secondary | ICD-10-CM

## 2014-01-16 NOTE — Progress Notes (Signed)
Subjective:    Patient ID: REECE MCBROOM, female    DOB: 1946/04/05, 68 y.o.   MRN: [id number] Chief Complaint  Patient presents with  . Pulmonary Consult    Referred by Dr. Hinton Rao for SOB when talking and with activity x few months.  Has nonprod cough - worse qhs.  No chest tightness/pain.    HPI Comments: Dyspnea x 2 months. Pt had low Fe issues.  Hx of breast Ca.  Lumpectomy 08/2013. Non aggressive Ca. No chemoRx.   Shortness of Breath This is a new problem. The current episode started more than 1 month ago. The problem occurs daily (pt is dyspneic at rest and exertion. (talking , walking)). The problem has been unchanged. Associated symptoms include leg pain, leg swelling and PND. Pertinent negatives include no abdominal pain, chest pain, claudication, coryza, ear pain, fever, headaches, hemoptysis, neck pain, orthopnea, rash, rhinorrhea, sore throat, sputum production, swollen glands, syncope, vomiting or wheezing. The symptoms are aggravated by any activity and eating. 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 Risk factors include smoking (prior smoking  3 cigs per day x 19yrs, quit 2012). She has tried nothing for the symptoms. Her past medical history is significant for pneumonia. There is no history of allergies, asthma, bronchiolitis, CAD, chronic lung disease, COPD, DVT, a heart failure, PE or a recent surgery. (Sees Dr Collene Mares, chronic diarrhea; PNA 2013 adn 2014)   Past Medical History  Diagnosis Date  . Diabetes mellitus without complication   . Hyperlipidemia   . Hypertension   . CKD (chronic kidney disease), stage III   . Carotid bruit   . Trigger finger   . Osteopenia   . Peripheral vascular disease     rt carotid stenosis  . GERD (gastroesophageal reflux disease)     occ  . Cancer     breast lft to have surgery, melanoma  . Iron deficiency  anemia   . Carotid artery occlusion   . PONV (postoperative nausea and vomiting)   . Headache(784.0)   . Urgency of urination     urgency and frequency; loss of control at times  . Diarrhea     sees Dr. Collene Mares  . Pneumonia     hx x2 2013  . Breast cancer      Family History  Problem Relation Age of Onset  . Cancer Mother     breast, uterus, lung  . Heart attack Mother   . Stroke Mother   . Heart disease Mother   . Hypertension Mother   . Varicose Veins Mother   . Heart disease Father   . Heart attack Father   . Kidney disease Father   . Hypertension Father   . Diabetes Father   . Cancer Father   . Heart attack Sister   . Heart disease Sister   . Asthma Father      History   Social History  . Marital Status: Widowed    Spouse Name: N/A    Number of Children: N/A  . Years of Education: N/A   Occupational History  .      [employer]   Social History Main Topics  . Smoking status: Former Smoker -- 0.25 packs/day for 3 years    Types: Cigarettes    Quit date: 07/20/2010  . Smokeless tobacco: Never Used  . Alcohol  Use: No  . Drug Use: No  . Sexual Activity: Not on file   Other Topics Concern  . Not on file   Social History Narrative  . No narrative on file     Allergies  Allergen Reactions  . Codeine Nausea And Vomiting and Other (See Comments)    Headache and vomiting  . Ace Inhibitors     Drop in GFR  . Clarithromycin     Unable to sleep   . Crestor [Rosuvastatin]     Body aches  . Lipitor [Atorvastatin]     Body aches  . Avandamet [Rosiglitazone-Metformin] Rash     Outpatient Prescriptions Prior to Visit  Medication Sig Dispense Refill  . aspirin EC 81 MG tablet Take 81 mg by mouth daily.      . Canagliflozin (INVOKANA) 300 MG TABS Take 300 mg by mouth daily.       . nebivolol (BYSTOLIC) 10 MG tablet Take 10 mg by mouth daily. Take 10 mg and 5 mg tablets for a 15 mg dose      . nebivolol (BYSTOLIC) 5 MG tablet Take 5 mg by mouth  daily. Take 5 mg and 10 mg tablets for a 15 mg dose      . Omega 3 1200 MG CAPS Take 1,200 mg by mouth daily.       . pantoprazole (PROTONIX) 40 MG tablet Take 40 mg by mouth daily.      . Saxagliptin-Metformin (KOMBIGLYZE XR) 2.11-998 MG TB24 Take 1 tablet by mouth 2 (two) times daily.       Marland Kitchen oxyCODONE-acetaminophen (ROXICET) 5-325 MG per tablet Take 1-2 tablets by mouth every 4 (four) hours as needed for severe pain.  30 tablet  0  . traMADol (ULTRAM) 50 MG tablet Take 1-2 tablets (50-100 mg total) by mouth every 6 (six) hours as needed.  30 tablet  1  . Diaper Rash Products (A+D DIAPER RASH) CREA Apply 1 application topically 2 (two) times daily. Apply to neck       No facility-administered medications prior to visit.       Review of Systems  Constitutional: Positive for fatigue. Negative for fever.  HENT: Negative for ear pain, nosebleeds, postnasal drip, rhinorrhea, sinus pressure, sore throat, trouble swallowing and voice change.   Respiratory: Positive for shortness of breath. Negative for hemoptysis, sputum production and wheezing.   Cardiovascular: Positive for leg swelling and PND. Negative for chest pain, orthopnea, claudication and syncope.  Gastrointestinal: Positive for diarrhea. Negative for vomiting and abdominal pain.  Musculoskeletal: Negative for neck pain.  Skin: Negative for rash.  Neurological: Negative for headaches.       Objective:   Physical Exam Filed Vitals:   01/16/14 1543  BP: 136/70  Pulse: 71  Temp: 97.9 F (36.6 C)  TempSrc: Oral  Height: 5\' 1"  (1.549 m)  Weight: 125 lb 12.8 oz (57.063 kg)  SpO2: 98%    Gen: Pleasant, well-nourished, in no distress,  normal affect  ENT: No lesions,  mouth clear,  oropharynx clear, no postnasal drip  Neck: No JVD, no TMG, no carotid bruits  Lungs: No use of accessory muscles, no dullness to percussion, clear without rales or rhonchi  Cardiovascular: RRR, heart sounds normal, no murmur or gallops, no  peripheral edema  Abdomen: soft and NT, no HSM,  BS normal  Musculoskeletal: No deformities, no cyanosis or clubbing  Neuro: alert, non focal  Skin: Warm, no lesions or rashes  No results found. No Cxr  or pfts yet done      Assessment & Plan:   Dyspnea Dyspnea with unclear etiology. Poss due to iron deficiency anemia. Doubt primary lung disease Plan Labs today: BNP and D Dimer to eval LV function and evident VTE Full pulmonary function study Chest xray today (last film was 06/2013 and your symptoms are new since that time) No medication changes for now Follow reflux diet as best as possible Return 2 months    Updated Medication List Outpatient Encounter Prescriptions as of 01/16/2014  Medication Sig  . aspirin EC 81 MG tablet Take 81 mg by mouth daily.  . Canagliflozin (INVOKANA) 300 MG TABS Take 300 mg by mouth daily.   . nebivolol (BYSTOLIC) 10 MG tablet Take 10 mg by mouth daily. Take 10 mg and 5 mg tablets for a 15 mg dose  . nebivolol (BYSTOLIC) 5 MG tablet Take 5 mg by mouth daily. Take 5 mg and 10 mg tablets for a 15 mg dose  . Omega 3 1200 MG CAPS Take 1,200 mg by mouth daily.   . pantoprazole (PROTONIX) 40 MG tablet Take 40 mg by mouth daily.  . Saxagliptin-Metformin (KOMBIGLYZE XR) 2.11-998 MG TB24 Take 1 tablet by mouth 2 (two) times daily.   Marland Kitchen oxyCODONE-acetaminophen (ROXICET) 5-325 MG per tablet Take 1-2 tablets by mouth every 4 (four) hours as needed for severe pain.  . traMADol (ULTRAM) 50 MG tablet Take 1-2 tablets (50-100 mg total) by mouth every 6 (six) hours as needed.  . [DISCONTINUED] Diaper Rash Products (A+D DIAPER RASH) CREA Apply 1 application topically 2 (two) times daily. Apply to neck

## 2014-01-16 NOTE — Patient Instructions (Signed)
Labs today: BNP and D Dimer to check heart function and clotting  Full pulmonary function study Chest xray today (last film was 06/2013 and your symptoms are new since that time) No medication changes for now Follow reflux diet as best as possible Return 2 months

## 2014-01-16 NOTE — Assessment & Plan Note (Signed)
Dyspnea with unclear etiology. Poss due to iron deficiency anemia. Doubt primary lung disease Plan Labs today: BNP and D Dimer to eval LV function and evident VTE Full pulmonary function study Chest xray today (last film was 06/2013 and your symptoms are new since that time) No medication changes for now Follow reflux diet as best as possible Return 2 months

## 2014-01-17 ENCOUNTER — Encounter: Payer: Self-pay | Admitting: Critical Care Medicine

## 2014-01-22 ENCOUNTER — Encounter: Payer: Self-pay | Admitting: Vascular Surgery

## 2014-01-23 ENCOUNTER — Ambulatory Visit (HOSPITAL_COMMUNITY)
Admission: RE | Admit: 2014-01-23 | Discharge: 2014-01-23 | Disposition: A | Discharge status: Home / Self Care | Payer: Medicare Other | Source: Ambulatory Visit | Attending: Vascular Surgery | Admitting: Vascular Surgery

## 2014-01-23 ENCOUNTER — Encounter: Payer: Self-pay | Admitting: Vascular Surgery

## 2014-01-23 ENCOUNTER — Ambulatory Visit (INDEPENDENT_AMBULATORY_CARE_PROVIDER_SITE_OTHER): Payer: Medicare Other | Admitting: Vascular Surgery

## 2014-01-23 VITALS — BP 138/66 | HR 74 | Resp 16 | Ht 61.0 in | Wt 125.0 lb

## 2014-01-23 DIAGNOSIS — I6529 Occlusion and stenosis of unspecified carotid artery: Secondary | ICD-10-CM | POA: Diagnosis not present

## 2014-01-23 DIAGNOSIS — Z48812 Encounter for surgical aftercare following surgery on the circulatory system: Secondary | ICD-10-CM | POA: Diagnosis not present

## 2014-01-23 DIAGNOSIS — I658 Occlusion and stenosis of other precerebral arteries: Secondary | ICD-10-CM | POA: Diagnosis not present

## 2014-01-23 DIAGNOSIS — I6523 Occlusion and stenosis of bilateral carotid arteries: Secondary | ICD-10-CM

## 2014-01-23 NOTE — Progress Notes (Signed)
Subjective:     Patient ID: Faith Gaines, female   DOB: 17-Aug-1945, 68 y.o.   MRN: [id number]  HPI this 68 year old female returns 6 months post right carotid endarterectomy for severe asymptomatic stenosis. She has done well and did have left breast surgery a few months ago and has progressed nicely and is not require radiation therapy. This was done by Dr. Barry Dienes. She denies any lateralizing weakness, aphasia, amaurosis fugax, diplopia, blurred vision, or syncope. Her occipital headaches have resolved. She does continue to have numbness anterior to her right neck incision.  Past Medical History  Diagnosis Date  . Diabetes mellitus without complication   . Hyperlipidemia   . Hypertension   . CKD (chronic kidney disease), stage III   . Carotid bruit   . Trigger finger   . Osteopenia   . Peripheral vascular disease     rt carotid stenosis  . GERD (gastroesophageal reflux disease)     occ  . Cancer     breast lft to have surgery, melanoma  . Iron deficiency anemia   . Carotid artery occlusion   . PONV (postoperative nausea and vomiting)   . Headache(784.0)   . Urgency of urination     urgency and frequency; loss of control at times  . Diarrhea     sees Dr. Collene Mares  . Pneumonia     hx x2 2013  . Breast cancer     History  Substance Use Topics  . Smoking status: Former Smoker -- 0.25 packs/day for 3 years    Types: Cigarettes    Quit date: 07/20/2010  . Smokeless tobacco: Never Used  . Alcohol Use: No    Family History  Problem Relation Age of Onset  . Cancer Mother     breast, uterus, lung  . Heart attack Mother   . Stroke Mother   . Heart disease Mother   . Hypertension Mother   . Varicose Veins Mother   . Heart disease Father   . Heart attack Father   . Kidney disease Father   . Hypertension Father   . Diabetes Father   . Cancer Father   . Heart attack Sister   . Heart disease Sister   . Asthma Father     Allergies  Allergen Reactions  . Codeine Nausea  And Vomiting and Other (See Comments)    Headache and vomiting  . Ace Inhibitors     Drop in GFR  . Clarithromycin     Unable to sleep   . Crestor [Rosuvastatin]     Body aches  . Lipitor [Atorvastatin]     Body aches  . Avandamet [Rosiglitazone-Metformin] Rash    Current outpatient prescriptions:aspirin EC 81 MG tablet, Take 81 mg by mouth daily., Disp: , Rfl: ;  Canagliflozin (INVOKANA) 300 MG TABS, Take 300 mg by mouth daily. , Disp: , Rfl: ;  nebivolol (BYSTOLIC) 10 MG tablet, Take 10 mg by mouth daily. Take 10 mg and 5 mg tablets for a 15 mg dose, Disp: , Rfl: ;  nebivolol (BYSTOLIC) 5 MG tablet, Take 5 mg by mouth daily. Take 5 mg and 10 mg tablets for a 15 mg dose, Disp: , Rfl:  Omega 3 1200 MG CAPS, Take 1,200 mg by mouth daily. , Disp: , Rfl: ;  oxyCODONE-acetaminophen (ROXICET) 5-325 MG per tablet, Take 1-2 tablets by mouth every 4 (four) hours as needed for severe pain., Disp: 30 tablet, Rfl: 0;  pantoprazole (PROTONIX) 40 MG tablet, Take 40  mg by mouth daily., Disp: , Rfl: ;  Saxagliptin-Metformin (KOMBIGLYZE XR) 2.11-998 MG TB24, Take 1 tablet by mouth 2 (two) times daily. , Disp: , Rfl:  traMADol (ULTRAM) 50 MG tablet, Take 1-2 tablets (50-100 mg total) by mouth every 6 (six) hours as needed., Disp: 30 tablet, Rfl: 1  BP 138/66  Pulse 74  Resp 16  Ht 5\' 1"  (1.549 m)  Wt 125 lb (56.7 kg)  BMI 23.63 kg/m2  Body mass index is 23.63 kg/(m^2).             Review of Systems denies chest pain, but has occasional orthopnea and dyspnea on exertion and left leg swelling. All other systems negative and a complete review of systems    Objective:   Physical Exam BP 138/66  Pulse 74  Resp 16  Ht 5\' 1"  (1.549 m)  Wt 125 lb (56.7 kg)  BMI 23.63 kg/m2  Gen.-alert and oriented x3 in no apparent distress HEENT normal for age Lungs no rhonchi or wheezing Cardiovascular regular rhythm no murmurs carotid pulses 3+ palpable no bruits audible Abdomen soft nontender no  palpable masses Musculoskeletal free of  major deformities Skin clear -no rashes Neurologic normal Lower extremities 3+ femoral and dorsalis pedis pulses palpable bilaterally with no edema  Today in order carotid duplex exam which I reviewed and interpreted. Right carotid endarterectomy site is widely patent. The left internal carotid has no significant stenosis.       Assessment:     Doing well 6 months post severe right ICA stenosis with carotid endarterectomy for asymptomatic lesion    Plan:     Return in 6 months to see nurse practitioner and repeat carotid duplex exam Will then follow patient on an annual basis Continue daily aspirin

## 2014-01-24 ENCOUNTER — Telehealth: Payer: Self-pay | Admitting: Critical Care Medicine

## 2014-01-24 NOTE — Telephone Encounter (Signed)
Pt had CXR done on 01/16/14 at [facility] that was ordered by Dr. Joya Gaskins. Per PW: Call pt, tell her cxr is normal.   Called, spoke with pt. Informed her of cxr results per Dr. Joya Gaskins.  She verbalized understanding. CXR results placed in scan folder. Pt also had lab work done -- requesting results. I will call medical records at [facility] to obtain these.

## 2014-01-24 NOTE — Telephone Encounter (Signed)
PW ordered a BNP and d-dimmer.  Only BNP was drawn. I do have BNP results.  Per fax: NT-proBNP drawn on 01/16/14 at [facility].  Result 95.  Pt aware these results will be addressed when PW returns to office next week and is ok with this. I apologized to pt about D-dimmer not getting done and asked her to please go back to [facility] to have this test done asap.  Pt states she cannot go today and will try to go tomorrow.  She will call to let me know once she's had this done so I can ensure results are received in a timely manner.    I called [facility], spoke with Neoma Laming in lab.  I have refaxed order to have D-dimmer done to Deborah at [phone].  She is aware and will call back if anything further is needed regarding this order.  Will await for D-dimmer results.

## 2014-01-25 DIAGNOSIS — R0609 Other forms of dyspnea: Secondary | ICD-10-CM | POA: Diagnosis not present

## 2014-01-25 DIAGNOSIS — R0989 Other specified symptoms and signs involving the circulatory and respiratory systems: Secondary | ICD-10-CM | POA: Diagnosis not present

## 2014-01-26 ENCOUNTER — Other Ambulatory Visit: Payer: Self-pay

## 2014-01-26 DIAGNOSIS — Z48812 Encounter for surgical aftercare following surgery on the circulatory system: Secondary | ICD-10-CM

## 2014-01-26 DIAGNOSIS — I6529 Occlusion and stenosis of unspecified carotid artery: Secondary | ICD-10-CM

## 2014-01-26 NOTE — Telephone Encounter (Signed)
Tell pt d dimer and bnp both NORMAL  And do NOT suggest heart or blood clot problems as cause for shortness of breath

## 2014-01-26 NOTE — Telephone Encounter (Signed)
Received D-dimer results done on 01/25/14 at [place] <100. Results placed in PW's folder for review next wk.

## 2014-01-26 NOTE — Telephone Encounter (Signed)
lmomtcb x1 

## 2014-01-29 NOTE — Telephone Encounter (Signed)
Spoke with patient-she is aware of normal labs. Nothing more needed at this time.

## 2014-01-30 DIAGNOSIS — R0602 Shortness of breath: Secondary | ICD-10-CM | POA: Diagnosis not present

## 2014-01-30 LAB — PULMONARY FUNCTION TEST

## 2014-02-06 ENCOUNTER — Telehealth: Payer: Self-pay | Admitting: Critical Care Medicine

## 2014-02-06 DIAGNOSIS — R0600 Dyspnea, unspecified: Secondary | ICD-10-CM

## 2014-02-06 NOTE — Telephone Encounter (Signed)
lmomtcb on pt's home and cell #s

## 2014-02-06 NOTE — Telephone Encounter (Signed)
Pt returned call & asks to be reached at [phone] or [phone] (work).  Faith Gaines

## 2014-02-06 NOTE — Telephone Encounter (Signed)
Tell pt PFTs NORMAL No lung cause seen for shortness of breath Keep next OV to regroup

## 2014-02-06 NOTE — Telephone Encounter (Signed)
Called, spoke with pt. Informed her of results and recs per Dr. Joya Gaskins.  She verbalized understanding, confirmed pending Feb 27, 2014 appt with PW, and voiced no further questions or concerns at this time.

## 2014-02-15 ENCOUNTER — Encounter: Payer: Self-pay | Admitting: Critical Care Medicine

## 2014-02-16 ENCOUNTER — Encounter: Payer: Self-pay | Admitting: Critical Care Medicine

## 2014-02-21 ENCOUNTER — Encounter: Payer: Self-pay | Admitting: Critical Care Medicine

## 2014-02-27 ENCOUNTER — Ambulatory Visit: Payer: Medicare Other | Admitting: Critical Care Medicine

## 2014-03-16 ENCOUNTER — Other Ambulatory Visit (INDEPENDENT_AMBULATORY_CARE_PROVIDER_SITE_OTHER): Payer: Self-pay | Admitting: General Surgery

## 2014-03-16 ENCOUNTER — Ambulatory Visit (INDEPENDENT_AMBULATORY_CARE_PROVIDER_SITE_OTHER): Payer: Medicare Other | Admitting: General Surgery

## 2014-03-16 VITALS — BP 130/70 | HR 67 | Temp 98.4°F | Ht 61.0 in | Wt 124.5 lb

## 2014-03-16 DIAGNOSIS — I6529 Occlusion and stenosis of unspecified carotid artery: Secondary | ICD-10-CM

## 2014-03-16 DIAGNOSIS — C50419 Malignant neoplasm of upper-outer quadrant of unspecified female breast: Secondary | ICD-10-CM

## 2014-03-16 DIAGNOSIS — C50412 Malignant neoplasm of upper-outer quadrant of left female breast: Secondary | ICD-10-CM

## 2014-03-16 DIAGNOSIS — Z853 Personal history of malignant neoplasm of breast: Secondary | ICD-10-CM

## 2014-03-16 NOTE — Patient Instructions (Signed)
Follow up with me in 6 months.  Get mammogram prior to seeing me at the breast center.    Heating pad as needed for breast/neck pain.

## 2014-03-16 NOTE — Progress Notes (Signed)
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  She continues to have soreness at the left inframammary fold.  This has improved somewhat.  She also has soreness in her right neck at her CEA surgical site.  She denies fever/chills.  She has not noted any new breast lesions or bone pain.  She is not on antiestrogen therapy.       PERTINENT REVIEW OF SYSTEMS: Otherwise negative x 11.    Filed Vitals:   03/16/14 0939  BP: 130/70  Pulse: 67  Temp: 98.4 F (36.9 C)   Wt Readings from Last 3 Encounters:  03/16/14 124 lb 8 oz (56.473 kg)  01/23/14 125 lb (56.7 kg)  01/16/14 125 lb 12.8 oz (57.063 kg)    EXAM: Head: Normocephalic and atraumatic.  Eyes:  Conjunctivae are normal. Pupils are equal, round, and reactive to light. No scleral icterus.  Neck:  Normal range of motion. Neck supple. No tracheal deviation present. No thyromegaly present.  Resp: No respiratory distress, normal effort. Breast:  Left breast without seroma.  No cellulitis.  Nipple not inverted.  Right breast normal.  Breasts are very dense for age.  No lymphadenopathy or skin dimpling.   Abd:  Abdomen is soft, non distended and non tender. No masses are palpable.  There is no rebound and no guarding.  Neurological: Alert and oriented to person, place, and time. Coordination normal.  Skin: Skin is warm and dry. No rash noted. No diaphoretic. No erythema. No pallor.  Psychiatric: Normal mood and affect. Normal behavior. Judgment and thought content normal.      ASSESSMENT AND PLAN:   Breast cancer of upper-outer quadrant of left female breast, Tis, 3 oclock No clinical evidence of disease.  Pt to follow up with Dr. Hinton Rao in November, then I will see her back in 6 months with mammogram  We will make sure bilateral diagnostic mammogram is ordered for late January/early February.       Milus Height, MD Surgical Oncology, [facility]  Vena Austria, MD Maury Dus, MD

## 2014-03-16 NOTE — Assessment & Plan Note (Signed)
No clinical evidence of disease.  Pt to follow up with Dr. Hinton Rao in November, then I will see her back in 6 months with mammogram  We will make sure bilateral diagnostic mammogram is ordered for late January/early February.

## 2014-03-20 ENCOUNTER — Ambulatory Visit: Payer: Medicare Other | Admitting: Critical Care Medicine

## 2014-04-06 ENCOUNTER — Ambulatory Visit (INDEPENDENT_AMBULATORY_CARE_PROVIDER_SITE_OTHER): Payer: Medicare Other | Admitting: Critical Care Medicine

## 2014-04-06 ENCOUNTER — Encounter: Payer: Self-pay | Admitting: Critical Care Medicine

## 2014-04-06 VITALS — BP 148/78 | HR 59 | Temp 97.1°F | Ht 61.0 in | Wt 124.2 lb

## 2014-04-06 DIAGNOSIS — D500 Iron deficiency anemia secondary to blood loss (chronic): Secondary | ICD-10-CM

## 2014-04-06 DIAGNOSIS — R0609 Other forms of dyspnea: Secondary | ICD-10-CM | POA: Diagnosis not present

## 2014-04-06 DIAGNOSIS — I6529 Occlusion and stenosis of unspecified carotid artery: Secondary | ICD-10-CM

## 2014-04-06 DIAGNOSIS — R0600 Dyspnea, unspecified: Secondary | ICD-10-CM

## 2014-04-06 DIAGNOSIS — R0989 Other specified symptoms and signs involving the circulatory and respiratory systems: Secondary | ICD-10-CM

## 2014-04-06 NOTE — Patient Instructions (Signed)
No change in medications,  Consider burp free fish oil, and refrigerate No further pulmonary follow up

## 2014-04-06 NOTE — Progress Notes (Signed)
   Subjective:    Patient ID: Faith Gaines, female    DOB: 20-Jun-1946, 68 y.o.   MRN: [id number]     HPI 04/06/2014 Chief Complaint  Patient presents with  . 2 month follow up    Breathing has improved - Has very little DOE.  Stil coughing some - this has improved as well.  Cough is nonprod.  No wheezing or chest tightness/pain  Pt had low hgb and taking iron, no transfusion.  No recent Hgb check  Pt saw gastro and pcp.  No source found.  ?hemorrhoids.   Pt denies any significant sore throat, nasal congestion or excess secretions, fever, chills, sweats, unintended weight loss, pleurtic or exertional chest pain, orthopnea PND, or leg swelling Pt denies any increase in rescue therapy over baseline, denies waking up needing it or having any early am or nocturnal exacerbations of coughing/wheezing/or dyspnea. Pt also denies any obvious fluctuation in symptoms with  weather or environmental change or other alleviating or aggravating factors  Review of Systems  Constitutional: Positive for fatigue.  HENT: Negative for nosebleeds, postnasal drip, sinus pressure, trouble swallowing and voice change.   Gastrointestinal: Positive for diarrhea.       Objective:   Physical Exam  Filed Vitals:   04/06/14 0916  BP: 148/78  Pulse: 59  Temp: 97.1 F (36.2 C)  TempSrc: Oral  Height: 5\' 1"  (1.549 m)  Weight: 124 lb 3.2 oz (56.337 kg)  SpO2: 98%    Gen: Pleasant, well-nourished, in no distress,  normal affect  ENT: No lesions,  mouth clear,  oropharynx clear, no postnasal drip  Neck: No JVD, no TMG, no carotid bruits  Lungs: No use of accessory muscles, no dullness to percussion, clear without rales or rhonchi  Cardiovascular: RRR, heart sounds normal, no murmur or gallops, no peripheral edema  Abdomen: soft and NT, no HSM,  BS normal  Musculoskeletal: No deformities, no cyanosis or clubbing  Neuro: alert, non focal  Skin: Warm, no lesions or rashes  No results found.     Assessment & Plan:   Dyspnea Dyspnea has resolved. No chest x-ray and pulmonary functions were normal. Plan No further pulmonary followup indicated   Likely some of the dyspnea was from iron deficiency anemia that is now improved   Updated Medication List Outpatient Encounter Prescriptions as of 04/06/2014  Medication Sig  . aspirin EC 81 MG tablet Take 81 mg by mouth daily.  . Canagliflozin (INVOKANA) 300 MG TABS Take 300 mg by mouth daily.   . nebivolol (BYSTOLIC) 10 MG tablet Take 10 mg by mouth daily. Take 10 mg and 5 mg tablets for a 15 mg dose  . nebivolol (BYSTOLIC) 5 MG tablet Take 5 mg by mouth daily. Take 5 mg and 10 mg tablets for a 15 mg dose  . Omega-3 Fatty Acids (OMEGA-3 2100) 1050 MG CAPS Take by mouth daily.  . pantoprazole (PROTONIX) 40 MG tablet Take 40 mg by mouth daily.  . Saxagliptin-Metformin (KOMBIGLYZE XR) 2.11-998 MG TB24 Take 1 tablet by mouth 2 (two) times daily.   . [DISCONTINUED] Omega 3 1200 MG CAPS Take 1,200 mg by mouth daily.

## 2014-04-07 NOTE — Assessment & Plan Note (Signed)
Dyspnea has resolved. No chest x-ray and pulmonary functions were normal. Plan No further pulmonary followup indicated

## 2014-04-27 DIAGNOSIS — J209 Acute bronchitis, unspecified: Secondary | ICD-10-CM | POA: Diagnosis not present

## 2014-05-23 ENCOUNTER — Other Ambulatory Visit: Payer: Self-pay | Admitting: Oncology

## 2014-05-23 DIAGNOSIS — M8580 Other specified disorders of bone density and structure, unspecified site: Secondary | ICD-10-CM | POA: Diagnosis not present

## 2014-05-23 DIAGNOSIS — Z853 Personal history of malignant neoplasm of breast: Secondary | ICD-10-CM | POA: Diagnosis not present

## 2014-05-23 DIAGNOSIS — Z85820 Personal history of malignant melanoma of skin: Secondary | ICD-10-CM | POA: Diagnosis not present

## 2014-06-12 DIAGNOSIS — N819 Female genital prolapse, unspecified: Secondary | ICD-10-CM | POA: Diagnosis not present

## 2014-06-12 DIAGNOSIS — Z01419 Encounter for gynecological examination (general) (routine) without abnormal findings: Secondary | ICD-10-CM | POA: Diagnosis not present

## 2014-06-18 ENCOUNTER — Ambulatory Visit
Admission: RE | Admit: 2014-06-18 | Discharge: 2014-06-18 | Disposition: A | Discharge status: Home / Self Care | Payer: Medicare Other | Source: Ambulatory Visit | Attending: Oncology | Admitting: Oncology

## 2014-06-18 ENCOUNTER — Ambulatory Visit
Admission: RE | Admit: 2014-06-18 | Discharge: 2014-06-18 | Disposition: A | Discharge status: Home / Self Care | Payer: Medicare Other | Source: Ambulatory Visit | Attending: General Surgery | Admitting: General Surgery

## 2014-06-18 ENCOUNTER — Encounter (INDEPENDENT_AMBULATORY_CARE_PROVIDER_SITE_OTHER): Payer: Self-pay

## 2014-06-18 DIAGNOSIS — R922 Inconclusive mammogram: Secondary | ICD-10-CM | POA: Diagnosis not present

## 2014-06-18 DIAGNOSIS — Z853 Personal history of malignant neoplasm of breast: Secondary | ICD-10-CM | POA: Diagnosis not present

## 2014-06-18 MED ORDER — GADOBENATE DIMEGLUMINE 529 MG/ML IV SOLN
11.0000 mL | Freq: Once | INTRAVENOUS | Status: AC | PRN
  Administered 2014-06-18: 11 mL via INTRAVENOUS

## 2014-06-28 DIAGNOSIS — K580 Irritable bowel syndrome with diarrhea: Secondary | ICD-10-CM | POA: Diagnosis not present

## 2014-06-28 DIAGNOSIS — K219 Gastro-esophageal reflux disease without esophagitis: Secondary | ICD-10-CM | POA: Diagnosis not present

## 2014-07-03 DIAGNOSIS — R3912 Poor urinary stream: Secondary | ICD-10-CM | POA: Diagnosis not present

## 2014-07-03 DIAGNOSIS — N3941 Urge incontinence: Secondary | ICD-10-CM | POA: Diagnosis not present

## 2014-07-03 DIAGNOSIS — N8111 Cystocele, midline: Secondary | ICD-10-CM | POA: Diagnosis not present

## 2014-07-03 DIAGNOSIS — N816 Rectocele: Secondary | ICD-10-CM | POA: Diagnosis not present

## 2014-07-17 ENCOUNTER — Encounter: Payer: Self-pay | Admitting: Vascular Surgery

## 2014-07-24 ENCOUNTER — Encounter: Payer: Self-pay | Admitting: Family

## 2014-07-24 ENCOUNTER — Encounter (INDEPENDENT_AMBULATORY_CARE_PROVIDER_SITE_OTHER): Payer: Self-pay

## 2014-07-24 ENCOUNTER — Ambulatory Visit (INDEPENDENT_AMBULATORY_CARE_PROVIDER_SITE_OTHER): Payer: Medicare Other | Admitting: Family

## 2014-07-24 ENCOUNTER — Ambulatory Visit (HOSPITAL_COMMUNITY)
Admission: RE | Admit: 2014-07-24 | Discharge: 2014-07-24 | Disposition: A | Discharge status: Home / Self Care | Payer: Medicare Other | Source: Ambulatory Visit | Attending: Family | Admitting: Family

## 2014-07-24 VITALS — BP 143/85 | HR 70 | Resp 16 | Ht 61.0 in | Wt 127.0 lb

## 2014-07-24 DIAGNOSIS — Z48812 Encounter for surgical aftercare following surgery on the circulatory system: Secondary | ICD-10-CM | POA: Insufficient documentation

## 2014-07-24 DIAGNOSIS — I6523 Occlusion and stenosis of bilateral carotid arteries: Secondary | ICD-10-CM

## 2014-07-24 DIAGNOSIS — Z9889 Other specified postprocedural states: Secondary | ICD-10-CM | POA: Diagnosis not present

## 2014-07-24 NOTE — Progress Notes (Signed)
Established Carotid Patient   History of Present Illness  Faith Gaines is a 69 y.o. female patient of Dr. Kellie Gaines returns status post right carotid endarterectomy on 06/29/13 for severe asymptomatic stenosis. She has done well and did have left breast surgery in 2015 and has progressed nicely and is not require radiation therapy. This was done by Dr. Barry Gaines. She denies any lateralizing weakness, aphasia, amaurosis fugax, diplopia, blurred vision, or syncope. She does continue to have numbness anterior to her right neck incision.  She has intermittent generalized headaches described a pressure that started a couple of months ago, is not worsening or improving; she had frontal headaches before the right CEA that resolved; she states she will speak with her PCP about this next month on her scheduled appointment.  She denies claudication symptoms with walking, denies non healing wounds.  The patient reports New Medical or Surgical History: she needs a bladder tacking, has a rectocele, uterine and bladder prolapse. Is seeing Dr. Collene Gaines for chronic diarrhea.  Pt Diabetic: Yes,, almost in control last A1C was 7.1 per pt Pt smoker: former smoker, quit in 2012  Pt meds include: Statin : No ASA: Yes Other anticoagulants/antiplatelets: no   Past Medical History  Diagnosis Date  . Diabetes mellitus without complication   . Hyperlipidemia   . Hypertension   . CKD (chronic kidney disease), stage III   . Carotid bruit   . Trigger finger   . Osteopenia   . Peripheral vascular disease     rt carotid stenosis  . GERD (gastroesophageal reflux disease)     occ  . Iron deficiency anemia   . Carotid artery occlusion   . PONV (postoperative nausea and vomiting)   . Headache(784.0)   . Urgency of urination     urgency and frequency; loss of control at times  . Diarrhea     sees Dr. Collene Gaines  . Pneumonia     hx x2 2013  . Iron deficiency anemia due to chronic blood loss   . Cancer     breast Left   to have surgery, melanoma  . Breast cancer     Social History History  Substance Use Topics  . Smoking status: Former Smoker -- 0.25 packs/day for 3 years    Types: Cigarettes    Quit date: 07/20/2010  . Smokeless tobacco: Never Used  . Alcohol Use: No    Family History Family History  Problem Relation Age of Onset  . Cancer Mother     breast, uterus, lung  . Heart attack Mother   . Stroke Mother   . Heart disease Mother     Open Heart Surgery  . Hypertension Mother   . Varicose Veins Mother   . Heart disease Father   . Heart attack Father   . Kidney disease Father   . Hypertension Father   . Diabetes Father   . Cancer Father   . Asthma Father   . Heart attack Sister   . Heart disease Sister     Surgical History Past Surgical History  Procedure Laterality Date  . Melanoma excision  12/1980    left leg,  . Bladder surgery  05/1981    bladder tack  . Cholecystectomy  1995  . Squamous cell carcinoma excision  2012  . Endarterectomy Right 06/29/2013    Procedure: ENDARTERECTOMY CAROTID-RIGHT, WITH DACRON PATCH ANGIOPLASTY;  Surgeon: Faith Misty, MD;  Location: [place];  Service: Vascular;  Laterality: Right;  . Appendectomy    .  Tubal ligation    . Colonoscopy    . Partial mastectomy with needle localization Left 08/22/2013    Procedure: PARTIAL MASTECTOMY WITH NEEDLE LOCALIZATION;  Surgeon: Faith Klein, MD;  Location: [place];  Service: General;  Laterality: Left;    Allergies  Allergen Reactions  . Codeine Nausea And Vomiting and Other (See Comments)    Headache and vomiting  . Ace Inhibitors     Drop in GFR  . Clarithromycin     Unable to sleep   . Crestor [Rosuvastatin]     Body aches  . Lipitor [Atorvastatin]     Body aches  . Avandamet [Rosiglitazone-Metformin] Rash    Current Outpatient Prescriptions  Medication Sig Dispense Refill  . aspirin EC 81 MG tablet Take 81 mg by mouth daily.    . Canagliflozin (INVOKANA) 300 MG TABS Take 300 mg by  mouth daily.     . nebivolol (BYSTOLIC) 10 MG tablet Take 10 mg by mouth daily. Take 10 mg and 5 mg tablets for a 15 mg dose    . nebivolol (BYSTOLIC) 5 MG tablet Take 5 mg by mouth daily. Take 5 mg and 10 mg tablets for a 15 mg dose    . Omega-3 Fatty Acids (OMEGA-3 2100) 1050 MG CAPS Take by mouth daily.    . pantoprazole (PROTONIX) 40 MG tablet Take 40 mg by mouth daily.    . Saxagliptin-Metformin (KOMBIGLYZE XR) 2.11-998 MG TB24 Take 1 tablet by mouth 2 (two) times daily.      No current facility-administered medications for this visit.    Review of Systems : See HPI for pertinent positives and negatives.  Physical Examination  Filed Vitals:   07/24/14 1001 07/24/14 1004  BP: 143/79 143/85  Pulse: 71 70  Resp:  16  Height:  5\' 1"  (1.549 m)  Weight:  127 lb (57.607 kg)  SpO2:  98%   Body mass index is 24.01 kg/(m^2).   General: WDWN female in NAD GAIT: normal Eyes: PERRLA Pulmonary:  Non-labored, CTAB, Negative  Rales, Negative rhonchi, & Negative wheezing.  Cardiac: regular Rhythm, no detected murmur.  VASCULAR EXAM Carotid Bruits Right Left   Negative Negative    Aorta is not palpable. Radial pulses are 2+ palpable and equal.                                                                                                                            LE Pulses Right Left       POPLITEAL  not palpable   not palpable    Gastrointestinal: soft, nontender, BS WNL, no r/g,  negative palpated masses.  Musculoskeletal: Negative muscle atrophy/wasting. M/S 5/5 throughout, Extremities without ischemic changes.  Neurologic: A&O X 3; Appropriate Affect;  Speech is normal CN 2-12 intact, Pain and light touch intact in extremities, Motor exam as listed above.   Non-Invasive Vascular Imaging CAROTID DUPLEX 07/24/2014   Right ICA: mild hyperplasia s/p CEA. Left ICA: <40%  stenosis.  These findings are Unchanged from previous exam.   Assessment: Faith Gaines is a 69  y.o. female who presents with asymptomatic mild hyperplasia in right ICA s/p CEA and minimal left ICA stenosis. The  ICA stenosis is  Unchanged from previous exam.  Plan: Follow-up in 1 year with Carotid Duplex.   I discussed in depth with the patient the nature of atherosclerosis, and emphasized the importance of maximal medical management including strict control of blood pressure, blood glucose, and lipid levels, obtaining regular exercise, and continued cessation of smoking.  The patient is aware that without maximal medical management the underlying atherosclerotic disease process will progress, limiting the benefit of any interventions. The patient was given information about stroke prevention and what symptoms should prompt the patient to seek immediate medical care. Thank you for allowing [place] to participate in this patient's care.  Faith Chambers, RN, MSN, FNP-C [facility] Office: [phone]  Clinic Physician: Faith Gaines  07/24/2014 10:01 AM

## 2014-07-24 NOTE — Addendum Note (Signed)
Addended by: Mena Goes on: 07/24/2014 03:46 PM   Modules accepted: Orders

## 2014-07-24 NOTE — Patient Instructions (Signed)
Stroke Prevention Some medical conditions and behaviors are associated with an increased chance of having a stroke. You may prevent a stroke by making healthy choices and managing medical conditions. HOW CAN I REDUCE MY RISK OF HAVING A STROKE?   Stay physically active. Get at least 30 minutes of activity on most or all days.  Do not smoke. It may also be helpful to avoid exposure to secondhand smoke.  Limit alcohol use. Moderate alcohol use is considered to be:  No more than 2 drinks per day for men.  No more than 1 drink per day for nonpregnant women.  Eat healthy foods. This involves:  Eating 5 or more servings of fruits and vegetables a day.  Making dietary changes that address high blood pressure (hypertension), high cholesterol, diabetes, or obesity.  Manage your cholesterol levels.  Making food choices that are high in fiber and low in saturated fat, trans fat, and cholesterol may control cholesterol levels.  Take any prescribed medicines to control cholesterol as directed by your health care provider.  Manage your diabetes.  Controlling your carbohydrate and sugar intake is recommended to manage diabetes.  Take any prescribed medicines to control diabetes as directed by your health care provider.  Control your hypertension.  Making food choices that are low in salt (sodium), saturated fat, trans fat, and cholesterol is recommended to manage hypertension.  Take any prescribed medicines to control hypertension as directed by your health care provider.  Maintain a healthy weight.  Reducing calorie intake and making food choices that are low in sodium, saturated fat, trans fat, and cholesterol are recommended to manage weight.  Stop drug abuse.  Avoid taking birth control pills.  Talk to your health care provider about the risks of taking birth control pills if you are over 35 years old, smoke, get migraines, or have ever had a blood clot.  Get evaluated for sleep  disorders (sleep apnea).  Talk to your health care provider about getting a sleep evaluation if you snore a lot or have excessive sleepiness.  Take medicines only as directed by your health care provider.  For some people, aspirin or blood thinners (anticoagulants) are helpful in reducing the risk of forming abnormal blood clots that can lead to stroke. If you have the irregular heart rhythm of atrial fibrillation, you should be on a blood thinner unless there is a good reason you cannot take them.  Understand all your medicine instructions.  Make sure that other conditions (such as anemia or atherosclerosis) are addressed. SEEK IMMEDIATE MEDICAL CARE IF:   You have sudden weakness or numbness of the face, arm, or leg, especially on one side of the body.  Your face or eyelid droops to one side.  You have sudden confusion.  You have trouble speaking (aphasia) or understanding.  You have sudden trouble seeing in one or both eyes.  You have sudden trouble walking.  You have dizziness.  You have a loss of balance or coordination.  You have a sudden, severe headache with no known cause.  You have new chest pain or an irregular heartbeat. Any of these symptoms may represent a serious problem that is an emergency. Do not wait to see if the symptoms will go away. Get medical help at once. Call your local emergency services (911 in [place] Do not drive yourself to the hospital. Document Released: 08/13/2004 Document Revised: 11/20/2013 Document Reviewed: 01/06/2013 Exit[employer] Patient Information 2015 [employer]. This information is not intended to replace advice given   to you by your health care provider. Make sure you discuss any questions you have with your health care provider.  

## 2014-07-30 DIAGNOSIS — N3941 Urge incontinence: Secondary | ICD-10-CM | POA: Diagnosis not present

## 2014-07-30 DIAGNOSIS — R3912 Poor urinary stream: Secondary | ICD-10-CM | POA: Diagnosis not present

## 2014-08-06 DIAGNOSIS — N814 Uterovaginal prolapse, unspecified: Secondary | ICD-10-CM | POA: Diagnosis not present

## 2014-08-06 DIAGNOSIS — N3020 Other chronic cystitis without hematuria: Secondary | ICD-10-CM | POA: Diagnosis not present

## 2014-08-06 DIAGNOSIS — N8111 Cystocele, midline: Secondary | ICD-10-CM | POA: Diagnosis not present

## 2014-08-06 DIAGNOSIS — R3912 Poor urinary stream: Secondary | ICD-10-CM | POA: Diagnosis not present

## 2014-08-13 ENCOUNTER — Other Ambulatory Visit: Payer: Self-pay | Admitting: Urology

## 2014-08-23 DIAGNOSIS — M8589 Other specified disorders of bone density and structure, multiple sites: Secondary | ICD-10-CM | POA: Diagnosis not present

## 2014-08-23 DIAGNOSIS — D0512 Intraductal carcinoma in situ of left breast: Secondary | ICD-10-CM | POA: Diagnosis not present

## 2014-08-24 DIAGNOSIS — N926 Irregular menstruation, unspecified: Secondary | ICD-10-CM | POA: Diagnosis not present

## 2014-10-08 DIAGNOSIS — E119 Type 2 diabetes mellitus without complications: Secondary | ICD-10-CM | POA: Diagnosis not present

## 2014-10-08 DIAGNOSIS — E782 Mixed hyperlipidemia: Secondary | ICD-10-CM | POA: Diagnosis not present

## 2014-10-08 DIAGNOSIS — C50919 Malignant neoplasm of unspecified site of unspecified female breast: Secondary | ICD-10-CM | POA: Diagnosis not present

## 2014-10-08 DIAGNOSIS — D509 Iron deficiency anemia, unspecified: Secondary | ICD-10-CM | POA: Diagnosis not present

## 2014-10-08 DIAGNOSIS — I10 Essential (primary) hypertension: Secondary | ICD-10-CM | POA: Diagnosis not present

## 2014-10-08 DIAGNOSIS — R300 Dysuria: Secondary | ICD-10-CM | POA: Diagnosis not present

## 2014-10-08 DIAGNOSIS — K219 Gastro-esophageal reflux disease without esophagitis: Secondary | ICD-10-CM | POA: Diagnosis not present

## 2014-10-08 NOTE — Patient Instructions (Addendum)
   Your procedure is scheduled on: Monday, April 4  Enter through the [employer] [facility] at: 6 AM Pick up the phone at the desk and dial [phone] and inform us of your arrival.  Please call this number if you have any problems the morning of surgery: [phone]  Remember: Do not eat or drink after midnight: Sunday Take these medicines the morning of surgery with a SIP OF WATER:  Nebivolol, pantoprazole.  Patient instructed to withhold Saxagliptin-Metformin Sunday night and Monday morning, day of surgery doses.  We will check your sugar upon arrival to Short Stay Dept.  Do not wear jewelry, make-up, or FINGER nail polish No metal in your hair or on your body. Do not wear lotions, powders, perfumes.  You may wear deodorant.  Do not bring valuables to the hospital. Contacts, dentures or bridgework may not be worn into surgery.  Leave suitcase in the car. After Surgery it may be brought to your room. For patients being admitted to the hospital, checkout time is 11:00am the day of discharge.  Home with daughter [name] cell [phone] or son [name] cell [phone]

## 2014-10-10 ENCOUNTER — Other Ambulatory Visit: Payer: Self-pay

## 2014-10-10 ENCOUNTER — Encounter (HOSPITAL_COMMUNITY): Payer: Self-pay

## 2014-10-10 ENCOUNTER — Encounter (HOSPITAL_COMMUNITY)
Admission: RE | Admit: 2014-10-10 | Discharge: 2014-10-10 | Disposition: A | Discharge status: Home / Self Care | Payer: Medicare Other | Source: Ambulatory Visit | Attending: Obstetrics and Gynecology | Admitting: Obstetrics and Gynecology

## 2014-10-10 DIAGNOSIS — Z01810 Encounter for preprocedural cardiovascular examination: Secondary | ICD-10-CM | POA: Insufficient documentation

## 2014-10-10 DIAGNOSIS — Z01812 Encounter for preprocedural laboratory examination: Secondary | ICD-10-CM | POA: Insufficient documentation

## 2014-10-10 HISTORY — DX: Major depressive disorder, single episode, unspecified: F32.9

## 2014-10-10 HISTORY — DX: Anxiety disorder, unspecified: F41.9

## 2014-10-10 HISTORY — DX: Depression, unspecified: F32.A

## 2014-10-10 LAB — CBC
HCT: 38.7 % (ref 36.0–46.0)
Hemoglobin: 11.9 g/dL — ABNORMAL LOW (ref 12.0–15.0)
MCH: 23.6 pg — ABNORMAL LOW (ref 26.0–34.0)
MCHC: 30.7 g/dL (ref 30.0–36.0)
MCV: 76.6 fL — ABNORMAL LOW (ref 78.0–100.0)
Platelets: 271 10*3/uL (ref 150–400)
RBC: 5.05 MIL/uL (ref 3.87–5.11)
RDW: 16.7 % — ABNORMAL HIGH (ref 11.5–15.5)
WBC: 11.5 10*3/uL — ABNORMAL HIGH (ref 4.0–10.5)

## 2014-10-10 LAB — TYPE AND SCREEN
ABO/RH(D): O POS
Antibody Screen: NEGATIVE

## 2014-10-10 LAB — BASIC METABOLIC PANEL
Anion gap: 10 (ref 5–15)
BUN: 20 mg/dL (ref 6–23)
CO2: 22 mmol/L (ref 19–32)
Calcium: 10.5 mg/dL (ref 8.4–10.5)
Chloride: 106 mmol/L (ref 96–112)
Creatinine, Ser: 1.09 mg/dL (ref 0.50–1.10)
GFR calc Af Amer: 60 mL/min — ABNORMAL LOW (ref >90)
GFR calc non Af Amer: 52 mL/min — ABNORMAL LOW (ref >90)
Glucose, Bld: 149 mg/dL — ABNORMAL HIGH (ref 70–99)
Potassium: 5 mmol/L (ref 3.5–5.1)
Sodium: 138 mmol/L (ref 135–145)

## 2014-10-10 LAB — ABO/RH: ABO/RH(D): O POS

## 2014-10-10 NOTE — Pre-Procedure Instructions (Signed)
Reviewed medical/surgical history and medications with Dr Lyndle Herrlich.  EKG reviewed. [place] for surgery.

## 2014-10-19 NOTE — H&P (Addendum)
Faith Gaines is an 69 y.o. female presents for surgical mngt of pelvic organ prolapse and incontinence.  Denies irregular vaginal bleeding.  No LMP recorded. Patient is postmenopausal.    Past Medical History  Diagnosis Date  . Hyperlipidemia     diet controlled, no meds  . Hypertension   . Carotid bruit   . Trigger finger     resolved with cortisone injections  . Osteopenia   . Peripheral vascular disease     rt carotid stenosis  . GERD (gastroesophageal reflux disease)     occ  . Iron deficiency anemia   . Carotid artery occlusion   . PONV (postoperative nausea and vomiting)   . Headache(784.0)   . Urgency of urination     urgency and frequency; loss of control at times  . Diarrhea     sees Dr. Collene Mares  . Iron deficiency anemia due to chronic blood loss   . SVD (spontaneous vaginal delivery)     x 3  . Diabetes mellitus without complication     type 2  . Pneumonia 12/2011, 05/2012    hx x2 in 2013  . CKD (chronic kidney disease), stage III     no meds, blood draws q 72mos by Dr. Alyson Ingles in [place]  . Cancer     breast Left 08/2013 surgery, melanoma  . Breast cancer   . Depression     no meds  . Anxiety     no meds    Past Surgical History  Procedure Laterality Date  . Melanoma excision  12/1980    left leg,  . Bladder surgery  05/1981    bladder tack  . Cholecystectomy  1995  . Squamous cell carcinoma excision  2012    left leg  . Endarterectomy Right 06/29/2013    Procedure: ENDARTERECTOMY CAROTID-RIGHT, WITH DACRON PATCH ANGIOPLASTY;  Surgeon: Mal Misty, MD;  Location: [place];  Service: Vascular;  Laterality: Right;  . Appendectomy    . Tubal ligation    . Colonoscopy    . Partial mastectomy with needle localization Left 08/22/2013    Procedure: PARTIAL MASTECTOMY WITH NEEDLE LOCALIZATION;  Surgeon: Stark Klein, MD;  Location: [facility];  Service: General;  Laterality: Left;  . Wisdom tooth extraction      Family History  Problem Relation Age of  Onset  . Cancer Mother     breast, uterus, lung  . Heart attack Mother   . Stroke Mother   . Heart disease Mother     Open Heart Surgery  . Hypertension Mother   . Varicose Veins Mother   . Heart disease Father   . Heart attack Father   . Kidney disease Father   . Hypertension Father   . Diabetes Father   . Cancer Father   . Asthma Father   . Heart attack Sister   . Heart disease Sister     Social History:  reports that she quit smoking about 4 years ago. Her smoking use included Cigarettes. She has a .75 pack-year smoking history. She has never used smokeless tobacco. She reports that she does not drink alcohol or use illicit drugs.  Allergies:  Allergies  Allergen Reactions  . Codeine Nausea And Vomiting and Other (See Comments)    Headache and vomiting  . Ace Inhibitors     Drop in GFR  . Clarithromycin     Unable to sleep   . Crestor [Rosuvastatin]     Body aches  .  Lipitor [Atorvastatin]     Body aches  . Avandamet [Rosiglitazone-Metformin] Rash    No prescriptions prior to admission    ROS  AF, VSS  Physical Exam  Gen - NAD Abd - soft, NT/ND CV - RRR Lungs - clear Ext - NT, no edema PV - grade 2-3 pelvic organ prolapse.   Uterus NT, no adnexal masses or free fluid   Assessment/Plan: Pelvic organ prolapse, urinary incontinence LAVH/BSO Urology to follow with vaginal repair and sling R/b/a discussed, questions answered, informed consent  Kaity Pitstick 10/19/2014, 1:17 PM

## 2014-10-21 ENCOUNTER — Encounter (HOSPITAL_COMMUNITY): Payer: Self-pay | Admitting: Anesthesiology

## 2014-10-21 MED ORDER — DEXTROSE 5 % IV SOLN
2.0000 g | INTRAVENOUS | Status: AC
  Administered 2014-10-22: 2 g via INTRAVENOUS
  Filled 2014-10-21: qty 2

## 2014-10-21 NOTE — Anesthesia Preprocedure Evaluation (Addendum)
Anesthesia Evaluation  Patient identified by MRN, date of birth, ID band Patient awake    Reviewed: Allergy & Precautions, NPO status , Patient's Chart, lab work & pertinent test results  History of Anesthesia Complications (+) PONV and history of anesthetic complications  Airway Mallampati: II  TM Distance: >3 FB Neck ROM: Full    Dental no notable dental hx. (+) Teeth Intact, Caps,    Pulmonary shortness of breath and with exertion, pneumonia -, resolved, former smoker,  breath sounds clear to auscultation  Pulmonary exam normal       Cardiovascular hypertension, Pt. on medications + Peripheral Vascular Disease Rhythm:Regular Rate:Normal  Hx/o carotid stenosis S/P RCE Left less than 40%   Neuro/Psych  Headaches, PSYCHIATRIC DISORDERS Anxiety Depression    GI/Hepatic GERD-  Medicated and Controlled,  Endo/Other  diabetes, Well Controlled, Type 2, Oral Hypoglycemic AgentsHyperlipidemia Hx/o Breast Ca  Renal/GU Renal InsufficiencyRenal disease   Cystocele    Musculoskeletal   Abdominal   Peds  Hematology  (+) anemia ,   Anesthesia Other Findings   Reproductive/Obstetrics Pelvic relaxation Vaginal vault prolapse Rectocele                          Anesthesia Physical Anesthesia Plan  ASA: III  Anesthesia Plan: General   Post-op Pain Management:    Induction: Intravenous  Airway Management Planned: Oral ETT  Additional Equipment:   Intra-op Plan:   Post-operative Plan: Extubation in OR  Informed Consent: I have reviewed the patients History and Physical, chart, labs and discussed the procedure including the risks, benefits and alternatives for the proposed anesthesia with the patient or authorized representative who has indicated his/her understanding and acceptance.   Dental advisory given  Plan Discussed with: CRNA, Anesthesiologist and Surgeon  Anesthesia Plan Comments:          Anesthesia Quick Evaluation

## 2014-10-21 NOTE — H&P (Signed)
History of Present Illness   Faith. Gaines has not followed up with Dr. Julien Girt again for a possible A&P and hysterectomy. I do not think she requires a sling. VESIcare has helped the urge incontinence and enuresis some but not a great deal. She was recently told through blood tests that she has worsening kidney disease and in the last several days or weeks she has noticed some rust color in her urine. She has some intermittent burning. She was known to have some chronic renal insufficiency. There is no other modifying factors or associated signs or symptoms. There is no other aggravating or relieving factors. The symptoms are milder in severity and persisting.    When I saw Faith Will in mid December, she was feeling more vaginal bulging. She had stopped Toviaz since she said it was not working well. 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 She had moderate nocturia and some ankle edema. She had frequency as noted. She had not had a hysterectomy and I think Dr Julien Girt was planning for more surgery. On pelvic examination her uterus exited her introitus approximately 2 cm and she lost her entire anterior vaginal length. With the prolapse reduced, she had mild to moderate central defect. She lost approximately 4-5 cm of posterior length and with vault and uterus suspended, she had a small distal grade 2 rectocele that was a little bit larger due to some mild deficiency of the posterior fourchette. She did not have stress incontinence.   She had a bladder suspension in 1982. I thought her weakened flow may be due to prolapse. I thought if she ever had surgery, she likely best benefit from a transvaginal hysterectomy with vault suspension, cystocele repair and graft, and intraoperative decision on whether or not she would benefit from a rectocele repair. I was going to assess her flow with and without the prolapse reduced. On urodynamics, she voided 435 mL with a  maximum flow of 31 mL/sec and she was catheterized for 100 mL. The flow pattern was normal. On urodynamics, her maximum capacity was 420 mL. Her bladder was unstable reaching pressures of 10 cmH2O. She did not leak. She did not leak with a Valsalva pressure of 122 cmH2O with and without the prolapse reduced. During voluntary voiding, she actually generated a detrusor contraction with her prolapse down, but could not void. With the prolapse reduced, she voided 250 mL with a with a maximum flow of 14 mL/sec with a maximum voiding pressure of 39-42 cmH2O. Residual was 175 mL. She actually generated pressures as high as 35 cmH2O with the prolapse not reduced and she had 0.0 flow. Fluoroscopically she had a large cystocele as noted and bladder neck descensus. Her bladder was somewhat trabeculated. EMG activity increased some during the voiding phase. The details of the urodynamics are signed and dictated on the urodynamic sheet.    Past Medical History Problems  1. History of Arthritis 2. History of breast cancer (Z85.3) 3. History of depression (Z86.59) 4. History of diabetes mellitus (Z86.39) 5. History of esophageal reflux (Z87.19) 6. History of hypercholesterolemia (Z86.39) 7. History of hypertension (Z86.79) 8. History of malignant melanoma (Z76.734)  Surgical History Problems  1. History of Bladder Surgery 2. History of Breast Surgery Lumpectomy 3. History of Bypass Graft (Non-Vein) Carotid 4. History of Gallbladder Surgery 5. History of Skin Tag Removal  Current Meds 1. Aspirin 81 MG Oral Tablet;  Therapy: (Recorded:08Apr2013) to Recorded 2. Bystolic 10 MG Oral Tablet;  Therapy: (Recorded:15Dec2015)  to Recorded 3. Bystolic 5 MG Oral Tablet;  Therapy: (Recorded:15Dec2015) to Recorded 4. Fish Oil CAPS;  Therapy: (Recorded:08Apr2013) to Recorded 5. Invokana 300 MG Oral Tablet;  Therapy: (Recorded:15Dec2015) to Recorded 6. Kombiglyze XR 11-998 MG Oral Tablet Extended Release 24 Hour;   Therapy: (Recorded:15Dec2015) to Recorded 7. Myrbetriq 50 MG Oral Tablet Extended Release 24 Hour; Take 1 tablet daily;  Therapy: [phone] to (Evaluate:31May2014); Last Rx:05Jun2013 Ordered 8. Protonix 40 MG Oral Tablet Delayed Release;  Therapy: (Recorded:08Apr2013) to Recorded 9. Toviaz 8 MG Oral Tablet Extended Release 24 Hour; TAKE 1 TABLET DAILY;  Therapy: [phone] to (Evaluate:31May2014); Last Rx:05Jun2013 Ordered 10. VESIcare 5 MG Oral Tablet; take 1 tablet by mouth once daily;   Therapy: 25Apr2013 to (Evaluate:20Apr2014); Last Rx:25Apr2013 Ordered  Allergies Medication  1. Codeine Derivatives  Family History Problems  1. Family history of Brain Cancer : Mother 2. Family history of Cardiac Arrest : Mother 3. Family history of Cardiac Arrest : Father 4. Family history of Cardiac Arrest : Sister 5. Family history of Death In The Family Father : Father   father passed @ age 72cardiac arrest 66. Family history of Death In The Family Mother : Mother   mother passed @ age 12lung cancer 7. Family history of Family Health Status Number Of Children   1 son2 daughters 4. Family history of Kidney Cancer : Father 32. Family history of Lung Cancer : Mother 30. Family history of Uterine Cancer : Mother  Social History Problems  1. Denied: History of Alcohol Use 2. Caffeine Use   1 cup of coffee 3. History of tobacco use (Z87.891)   smoked 3-4 cigs dailysmoked for 6 yearsquit smoking 1 1/2 years ago 4. Marital History - Widowed 5. Occupation:   self employed  Results/Data Urine [Data Includes: Last 1 Day]   [id number]  COLOR YELLOW   APPEARANCE CLEAR   SPECIFIC GRAVITY 1.015   pH 5.5   GLUCOSE > 1000 mg/dL  BILIRUBIN NEG   KETONE NEG mg/dL  BLOOD NEG   PROTEIN NEG mg/dL  UROBILINOGEN 0.2 mg/dL  NITRITE NEG   LEUKOCYTE ESTERASE NEG    Assessment Assessed  1. Weak urinary stream (R39.12) 2. 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 Uterovaginal prolapse  (N81.4)  Plan Chronic cystitis  1. Start: Ciprofloxacin HCl - 250 MG Oral Tablet (Cipro); 250 mg bid for 3 days 2. Follow-up PRN Office  Follow-up  Status: Complete  Done: [id number] Health Maintenance  3. UA With REFLEX; [Do Not Release]; Status:Resulted - Requires Verification;   Done:  [id number] 01:26PM  Discussion/Summary   I drew Faith Sweeney a picture. I believe her weak stream is from her prolapse, though she has reasonable expectations regarding her flow if he ever had surgery. Based upon her urodynamics, she does not require a sling, but the risk of de novo stress incontinence was discussed. I discussed a pessary versus watchful waiting versus a transvaginal vault suspensions, cystocele repair and possible rectocele repair.   I drew her a picture and we talked about prolapse surgery in detail. Pros, cons, general surgical and anesthetic risks, and other options including behavioral therapy, pessaries, and watchful waiting were discussed. She understands that prolapse repairs are successful in 80-85% of cases for prolapse symptoms and can recur anteriorly, posteriorly, and/or apically. She understands that in most cases I use a graft and general risks were discussed. Surgical risks were described but not limited to the discussion of injury to neighboring structures including the bowel (with possible life-threatening sepsis and colostomy), bladder,  urethra, vagina (all resulting in further surgery), and ureter (resulting in re-implantation). We talked about injury to nerves/soft tissue leading to debilitating and intractable pelvic, abdominal, and lower extremity pain syndromes and neuropathies. The risks of buttock pain, intractable dyspareunia, and vaginal narrowing and shortening with sequelae were discussed. Bleeding risks, transfusion rates, and infection were discussed. The risk of persistent, de novo, or worsening bladder and/or bowel incontinence/dysfunction was discussed. The need for  CIC was described as well the usual post-operative course. The patient understands that she might not reach her treatment goal and that she might be worse following surgery.   She understands that her urge incontinence and frequency in most cases is not related to her prolapse and would be treated independently.   Mesh issues also discussed. She has had some issues with burning since her urodynamics. She was self-treating herself at home with a home antibiotic that she thinks temporarily helped. I thought under the circumstances it was not unreasonable to give her ciprofloxacin 250 mg twice a day for 3 days and she can get some Cystex or something over-the-counter.  I will await to hear from Dr Julien Girt. I think she wants to proceed with surgery. Whether or not Dr Julien Girt plans on doing a rectocele repair I will leave up to her and again, how much of the anterior repair as well.   After a thorough review of the management options for the patient's condition the patient  elected to proceed with surgical therapy as noted above. We have discussed the potential benefits and risks of the procedure, side effects of the proposed treatment, the likelihood of the patient achieving the goals of the procedure, and any potential problems that might occur during the procedure or recuperation. Informed consent has been obtained.

## 2014-10-22 ENCOUNTER — Ambulatory Visit (HOSPITAL_COMMUNITY): Payer: Medicare Other | Admitting: Anesthesiology

## 2014-10-22 ENCOUNTER — Observation Stay (HOSPITAL_COMMUNITY)
Admission: RE | Admit: 2014-10-22 | Discharge: 2014-10-23 | Disposition: A | Discharge status: Home / Self Care | Payer: Medicare Other | Source: Ambulatory Visit | Attending: Obstetrics and Gynecology | Admitting: Obstetrics and Gynecology

## 2014-10-22 ENCOUNTER — Encounter (HOSPITAL_COMMUNITY)
Admission: RE | Disposition: A | Discharge status: Home / Self Care | Payer: Self-pay | Source: Ambulatory Visit | Attending: Obstetrics and Gynecology

## 2014-10-22 ENCOUNTER — Encounter (HOSPITAL_COMMUNITY): Payer: Self-pay | Admitting: Certified Registered Nurse Anesthetist

## 2014-10-22 DIAGNOSIS — F329 Major depressive disorder, single episode, unspecified: Secondary | ICD-10-CM | POA: Insufficient documentation

## 2014-10-22 DIAGNOSIS — Z853 Personal history of malignant neoplasm of breast: Secondary | ICD-10-CM | POA: Insufficient documentation

## 2014-10-22 DIAGNOSIS — R32 Unspecified urinary incontinence: Secondary | ICD-10-CM | POA: Diagnosis not present

## 2014-10-22 DIAGNOSIS — Z885 Allergy status to narcotic agent status: Secondary | ICD-10-CM | POA: Diagnosis not present

## 2014-10-22 DIAGNOSIS — Z87891 Personal history of nicotine dependence: Secondary | ICD-10-CM | POA: Insufficient documentation

## 2014-10-22 DIAGNOSIS — N8110 Cystocele, unspecified: Secondary | ICD-10-CM | POA: Diagnosis not present

## 2014-10-22 DIAGNOSIS — Z7982 Long term (current) use of aspirin: Secondary | ICD-10-CM | POA: Insufficient documentation

## 2014-10-22 DIAGNOSIS — Z881 Allergy status to other antibiotic agents status: Secondary | ICD-10-CM | POA: Diagnosis not present

## 2014-10-22 DIAGNOSIS — N814 Uterovaginal prolapse, unspecified: Principal | ICD-10-CM | POA: Insufficient documentation

## 2014-10-22 DIAGNOSIS — I739 Peripheral vascular disease, unspecified: Secondary | ICD-10-CM | POA: Diagnosis not present

## 2014-10-22 DIAGNOSIS — F419 Anxiety disorder, unspecified: Secondary | ICD-10-CM | POA: Diagnosis not present

## 2014-10-22 DIAGNOSIS — D259 Leiomyoma of uterus, unspecified: Secondary | ICD-10-CM | POA: Diagnosis not present

## 2014-10-22 DIAGNOSIS — D509 Iron deficiency anemia, unspecified: Secondary | ICD-10-CM | POA: Insufficient documentation

## 2014-10-22 DIAGNOSIS — N3941 Urge incontinence: Secondary | ICD-10-CM | POA: Insufficient documentation

## 2014-10-22 DIAGNOSIS — I129 Hypertensive chronic kidney disease with stage 1 through stage 4 chronic kidney disease, or unspecified chronic kidney disease: Secondary | ICD-10-CM | POA: Diagnosis not present

## 2014-10-22 DIAGNOSIS — M1990 Unspecified osteoarthritis, unspecified site: Secondary | ICD-10-CM | POA: Insufficient documentation

## 2014-10-22 DIAGNOSIS — N8189 Other female genital prolapse: Secondary | ICD-10-CM | POA: Diagnosis not present

## 2014-10-22 DIAGNOSIS — E780 Pure hypercholesterolemia: Secondary | ICD-10-CM | POA: Diagnosis not present

## 2014-10-22 DIAGNOSIS — Z79899 Other long term (current) drug therapy: Secondary | ICD-10-CM | POA: Diagnosis not present

## 2014-10-22 DIAGNOSIS — N183 Chronic kidney disease, stage 3 (moderate): Secondary | ICD-10-CM | POA: Insufficient documentation

## 2014-10-22 DIAGNOSIS — E785 Hyperlipidemia, unspecified: Secondary | ICD-10-CM | POA: Insufficient documentation

## 2014-10-22 DIAGNOSIS — E119 Type 2 diabetes mellitus without complications: Secondary | ICD-10-CM | POA: Diagnosis not present

## 2014-10-22 DIAGNOSIS — N816 Rectocele: Secondary | ICD-10-CM | POA: Diagnosis not present

## 2014-10-22 DIAGNOSIS — N8111 Cystocele, midline: Secondary | ICD-10-CM | POA: Diagnosis not present

## 2014-10-22 DIAGNOSIS — Z888 Allergy status to other drugs, medicaments and biological substances status: Secondary | ICD-10-CM | POA: Diagnosis not present

## 2014-10-22 DIAGNOSIS — K219 Gastro-esophageal reflux disease without esophagitis: Secondary | ICD-10-CM | POA: Insufficient documentation

## 2014-10-22 HISTORY — PX: SALPINGOOPHORECTOMY: SHX82

## 2014-10-22 HISTORY — PX: LAPAROSCOPIC ASSISTED VAGINAL HYSTERECTOMY: SHX5398

## 2014-10-22 HISTORY — PX: ANTERIOR AND POSTERIOR REPAIR: SHX5121

## 2014-10-22 HISTORY — PX: VAGINAL PROLAPSE REPAIR: SHX830

## 2014-10-22 LAB — GLUCOSE, CAPILLARY
Glucose-Capillary: 157 mg/dL — ABNORMAL HIGH (ref 70–99)
Glucose-Capillary: 184 mg/dL — ABNORMAL HIGH (ref 70–99)
Glucose-Capillary: 173 mg/dL — ABNORMAL HIGH (ref 70–99)
Glucose-Capillary: 182 mg/dL — ABNORMAL HIGH (ref 70–99)

## 2014-10-22 LAB — CK TOTAL AND CKMB (NOT AT ARMC)
CK, MB: 0.7 ng/mL (ref 0.3–4.0)
Relative Index: INVALID (ref 0.0–2.5)
Total CK: 33 U/L (ref 7–177)

## 2014-10-22 LAB — TROPONIN I
Troponin I: <0.03 ng/mL (ref <0.031)
Troponin I: <0.03 ng/mL (ref <0.031)

## 2014-10-22 SURGERY — HYSTERECTOMY, VAGINAL, LAPAROSCOPY-ASSISTED
Anesthesia: General

## 2014-10-22 MED ORDER — MEPERIDINE HCL 25 MG/ML IJ SOLN: 6.2500 mg | INTRAMUSCULAR | Status: DC | PRN

## 2014-10-22 MED ORDER — ONDANSETRON HCL 4 MG PO TABS: 4.0000 mg | ORAL_TABLET | Freq: Four times a day (QID) | ORAL | Status: DC | PRN

## 2014-10-22 MED ORDER — PROPOFOL 10 MG/ML IV BOLUS
INTRAVENOUS | Status: DC | PRN
  Administered 2014-10-22: 120 mg via INTRAVENOUS

## 2014-10-22 MED ORDER — LABETALOL HCL 5 MG/ML IV SOLN
INTRAVENOUS | Status: AC
  Filled 2014-10-22: qty 4

## 2014-10-22 MED ORDER — PHENYLEPHRINE 40 MCG/ML (10ML) SYRINGE FOR IV PUSH (FOR BLOOD PRESSURE SUPPORT)
PREFILLED_SYRINGE | INTRAVENOUS | Status: AC
  Filled 2014-10-22: qty 10

## 2014-10-22 MED ORDER — METFORMIN HCL 500 MG PO TABS
1000.0000 mg | ORAL_TABLET | Freq: Two times a day (BID) | ORAL | Status: DC
  Filled 2014-10-22 (×2): qty 2

## 2014-10-22 MED ORDER — LIDOCAINE-EPINEPHRINE (PF) 1 %-1:200000 IJ SOLN
INTRAMUSCULAR | Status: AC
  Filled 2014-10-22: qty 10

## 2014-10-22 MED ORDER — GLYCOPYRROLATE 0.2 MG/ML IJ SOLN
INTRAMUSCULAR | Status: AC
  Filled 2014-10-22: qty 2

## 2014-10-22 MED ORDER — FENTANYL CITRATE 0.05 MG/ML IJ SOLN
INTRAMUSCULAR | Status: DC | PRN
Start: 2014-10-22 — End: 2014-10-22
  Administered 2014-10-22 (×2): 50 ug via INTRAVENOUS
  Administered 2014-10-22 (×2): 100 ug via INTRAVENOUS
  Administered 2014-10-22: 50 ug via INTRAVENOUS

## 2014-10-22 MED ORDER — PROPOFOL 10 MG/ML IV BOLUS
INTRAVENOUS | Status: AC
  Filled 2014-10-22: qty 20

## 2014-10-22 MED ORDER — GLYCOPYRROLATE 0.2 MG/ML IJ SOLN
INTRAMUSCULAR | Status: DC | PRN
  Administered 2014-10-22: 0.4 mg via INTRAVENOUS

## 2014-10-22 MED ORDER — CIPROFLOXACIN IN D5W 400 MG/200ML IV SOLN
400.0000 mg | Freq: Once | INTRAVENOUS | Status: AC
  Administered 2014-10-22: 400 mg via INTRAVENOUS
  Filled 2014-10-22: qty 200

## 2014-10-22 MED ORDER — PHENAZOPYRIDINE HCL 200 MG PO TABS
200.0000 mg | ORAL_TABLET | Freq: Once | ORAL | Status: AC
  Administered 2014-10-22: 200 mg via ORAL
  Filled 2014-10-22: qty 1

## 2014-10-22 MED ORDER — FENTANYL CITRATE 0.05 MG/ML IJ SOLN
INTRAMUSCULAR | Status: AC
  Filled 2014-10-22: qty 5

## 2014-10-22 MED ORDER — LINAGLIPTIN 5 MG PO TABS
5.0000 mg | ORAL_TABLET | Freq: Every day | ORAL | Status: DC
  Filled 2014-10-22: qty 1

## 2014-10-22 MED ORDER — MIDAZOLAM HCL 2 MG/2ML IJ SOLN
INTRAMUSCULAR | Status: AC
  Filled 2014-10-22: qty 2

## 2014-10-22 MED ORDER — ONDANSETRON HCL 4 MG/2ML IJ SOLN: 4.0000 mg | Freq: Four times a day (QID) | INTRAMUSCULAR | Status: DC | PRN

## 2014-10-22 MED ORDER — DEXAMETHASONE SODIUM PHOSPHATE 4 MG/ML IJ SOLN
INTRAMUSCULAR | Status: AC
  Filled 2014-10-22: qty 1

## 2014-10-22 MED ORDER — DEXAMETHASONE SODIUM PHOSPHATE 10 MG/ML IJ SOLN
INTRAMUSCULAR | Status: DC | PRN
  Administered 2014-10-22: 4 mg via INTRAVENOUS

## 2014-10-22 MED ORDER — DIPHENHYDRAMINE HCL 50 MG/ML IJ SOLN
INTRAMUSCULAR | Status: DC | PRN
  Administered 2014-10-22: 25 mg via INTRAVENOUS

## 2014-10-22 MED ORDER — METOCLOPRAMIDE HCL 5 MG/ML IJ SOLN: 10.0000 mg | Freq: Once | INTRAMUSCULAR | Status: DC | PRN

## 2014-10-22 MED ORDER — FENTANYL CITRATE 0.05 MG/ML IJ SOLN
25.0000 ug | INTRAMUSCULAR | Status: DC | PRN
  Administered 2014-10-22 (×3): 25 ug via INTRAVENOUS

## 2014-10-22 MED ORDER — SODIUM CHLORIDE 0.9 % IR SOLN
Freq: Once | Status: DC
  Filled 2014-10-22: qty 1

## 2014-10-22 MED ORDER — BUPIVACAINE HCL (PF) 0.25 % IJ SOLN
INTRAMUSCULAR | Status: DC | PRN
  Administered 2014-10-22: 6 mL

## 2014-10-22 MED ORDER — ROCURONIUM BROMIDE 100 MG/10ML IV SOLN
INTRAVENOUS | Status: AC
  Filled 2014-10-22: qty 1

## 2014-10-22 MED ORDER — NEOSTIGMINE METHYLSULFATE 10 MG/10ML IV SOLN
INTRAVENOUS | Status: DC | PRN
  Administered 2014-10-22: 3 mg via INTRAVENOUS

## 2014-10-22 MED ORDER — NEOSTIGMINE METHYLSULFATE 10 MG/10ML IV SOLN
INTRAVENOUS | Status: AC
  Filled 2014-10-22: qty 1

## 2014-10-22 MED ORDER — LABETALOL HCL 5 MG/ML IV SOLN
INTRAVENOUS | Status: DC | PRN
  Administered 2014-10-22: 5 mg via INTRAVENOUS

## 2014-10-22 MED ORDER — ONDANSETRON HCL 4 MG/2ML IJ SOLN
INTRAMUSCULAR | Status: AC
  Filled 2014-10-22: qty 2

## 2014-10-22 MED ORDER — SODIUM CHLORIDE 0.9 % IR SOLN
Status: DC | PRN
  Administered 2014-10-22: 500 mL

## 2014-10-22 MED ORDER — SCOPOLAMINE 1 MG/3DAYS TD PT72
MEDICATED_PATCH | TRANSDERMAL | Status: DC | PRN
  Administered 2014-10-22: 1 via TRANSDERMAL

## 2014-10-22 MED ORDER — SAXAGLIPTIN-METFORMIN ER 2.5-1000 MG PO TB24: 1.0000 | ORAL_TABLET | Freq: Two times a day (BID) | ORAL | Status: DC

## 2014-10-22 MED ORDER — HYDROMORPHONE HCL 2 MG PO TABS: 1.0000 mg | ORAL_TABLET | ORAL | Status: DC | PRN

## 2014-10-22 MED ORDER — DEXTROSE IN LACTATED RINGERS 5 % IV SOLN
INTRAVENOUS | Status: DC
  Administered 2014-10-22 – 2014-10-23 (×3): via INTRAVENOUS

## 2014-10-22 MED ORDER — SAXAGLIPTIN-METFORMIN ER 2.5-1000 MG PO TB24
1.0000 | ORAL_TABLET | Freq: Two times a day (BID) | ORAL | Status: DC
  Administered 2014-10-23 (×2): via ORAL
  Filled 2014-10-22 (×4): qty 1

## 2014-10-22 MED ORDER — LACTATED RINGERS IV SOLN
INTRAVENOUS | Status: DC
  Administered 2014-10-22 (×3): via INTRAVENOUS

## 2014-10-22 MED ORDER — SCOPOLAMINE 1 MG/3DAYS TD PT72
MEDICATED_PATCH | TRANSDERMAL | Status: AC
  Filled 2014-10-22: qty 1

## 2014-10-22 MED ORDER — FENTANYL CITRATE 0.05 MG/ML IJ SOLN
INTRAMUSCULAR | Status: AC
  Administered 2014-10-22: 25 ug via INTRAVENOUS
  Filled 2014-10-22: qty 2

## 2014-10-22 MED ORDER — MIDAZOLAM HCL 2 MG/2ML IJ SOLN
INTRAMUSCULAR | Status: DC | PRN
  Administered 2014-10-22: 1 mg via INTRAVENOUS

## 2014-10-22 MED ORDER — MENTHOL 3 MG MT LOZG
1.0000 | LOZENGE | OROMUCOSAL | Status: DC | PRN
  Filled 2014-10-22: qty 9

## 2014-10-22 MED ORDER — EPHEDRINE SULFATE 50 MG/ML IJ SOLN
INTRAMUSCULAR | Status: DC | PRN
  Administered 2014-10-22 (×9): 5 mg via INTRAVENOUS

## 2014-10-22 MED ORDER — NEBIVOLOL HCL 10 MG PO TABS
10.0000 mg | ORAL_TABLET | Freq: Every day | ORAL | Status: DC
  Filled 2014-10-22 (×2): qty 1

## 2014-10-22 MED ORDER — HYDROMORPHONE HCL 1 MG/ML IJ SOLN
0.5000 mg | INTRAMUSCULAR | Status: DC | PRN
  Administered 2014-10-22: 0.5 mg via INTRAVENOUS
  Filled 2014-10-22: qty 1

## 2014-10-22 MED ORDER — CANAGLIFLOZIN 300 MG PO TABS
300.0000 mg | ORAL_TABLET | Freq: Every day | ORAL | Status: DC
  Filled 2014-10-22 (×3): qty 300

## 2014-10-22 MED ORDER — DIPHENHYDRAMINE HCL 50 MG/ML IJ SOLN
INTRAMUSCULAR | Status: AC
  Filled 2014-10-22: qty 1

## 2014-10-22 MED ORDER — KETOROLAC TROMETHAMINE 30 MG/ML IJ SOLN
30.0000 mg | Freq: Three times a day (TID) | INTRAMUSCULAR | Status: AC
  Administered 2014-10-22 – 2014-10-23 (×3): 30 mg via INTRAVENOUS
  Filled 2014-10-22 (×3): qty 1

## 2014-10-22 MED ORDER — ONDANSETRON HCL 4 MG/2ML IJ SOLN
INTRAMUSCULAR | Status: DC | PRN
  Administered 2014-10-22: 4 mg via INTRAVENOUS

## 2014-10-22 MED ORDER — LIDOCAINE HCL (CARDIAC) 20 MG/ML IV SOLN
INTRAVENOUS | Status: DC | PRN
  Administered 2014-10-22: 60 mg via INTRAVENOUS

## 2014-10-22 MED ORDER — ROCURONIUM BROMIDE 100 MG/10ML IV SOLN
INTRAVENOUS | Status: DC | PRN
  Administered 2014-10-22: 50 mg via INTRAVENOUS
  Administered 2014-10-22 (×3): 10 mg via INTRAVENOUS
  Administered 2014-10-22: 20 mg via INTRAVENOUS

## 2014-10-22 MED ORDER — EPHEDRINE 5 MG/ML INJ
INTRAVENOUS | Status: AC
  Filled 2014-10-22: qty 10

## 2014-10-22 MED ORDER — METOCLOPRAMIDE HCL 5 MG/ML IJ SOLN
INTRAMUSCULAR | Status: AC
Start: 2014-10-22 — End: 2014-10-22
  Filled 2014-10-22: qty 2

## 2014-10-22 MED ORDER — METOCLOPRAMIDE HCL 5 MG/ML IJ SOLN
INTRAMUSCULAR | Status: DC | PRN
  Administered 2014-10-22: 10 mg via INTRAVENOUS

## 2014-10-22 MED ORDER — OXYCODONE-ACETAMINOPHEN 5-325 MG PO TABS
1.0000 | ORAL_TABLET | ORAL | Status: DC | PRN
  Administered 2014-10-22 – 2014-10-23 (×3): 1 via ORAL
  Administered 2014-10-23: 2 via ORAL
  Administered 2014-10-23: 1 via ORAL
  Filled 2014-10-22 (×2): qty 1
  Filled 2014-10-22: qty 2
  Filled 2014-10-22 (×2): qty 1

## 2014-10-22 MED ORDER — ESTRADIOL 0.1 MG/GM VA CREA
TOPICAL_CREAM | VAGINAL | Status: DC | PRN
  Administered 2014-10-22: 1 via VAGINAL

## 2014-10-22 MED ORDER — BUPIVACAINE HCL (PF) 0.25 % IJ SOLN
INTRAMUSCULAR | Status: AC
  Filled 2014-10-22: qty 30

## 2014-10-22 MED ORDER — LIDOCAINE HCL (CARDIAC) 20 MG/ML IV SOLN
INTRAVENOUS | Status: AC
  Filled 2014-10-22: qty 5

## 2014-10-22 MED ORDER — LIDOCAINE-EPINEPHRINE (PF) 1 %-1:200000 IJ SOLN
INTRAMUSCULAR | Status: DC | PRN
  Administered 2014-10-22: 39 mL

## 2014-10-22 MED ORDER — FLEET ENEMA 7-19 GM/118ML RE ENEM: 1.0000 | ENEMA | Freq: Once | RECTAL | Status: DC

## 2014-10-22 MED ORDER — PANTOPRAZOLE SODIUM 40 MG PO TBEC
40.0000 mg | DELAYED_RELEASE_TABLET | Freq: Every day | ORAL | Status: DC
  Administered 2014-10-23: 40 mg via ORAL
  Filled 2014-10-22 (×3): qty 1

## 2014-10-22 MED ORDER — ESTRADIOL 0.1 MG/GM VA CREA
TOPICAL_CREAM | VAGINAL | Status: AC
  Filled 2014-10-22: qty 42.5

## 2014-10-22 MED ORDER — PHENYLEPHRINE HCL 10 MG/ML IJ SOLN
INTRAMUSCULAR | Status: DC | PRN
  Administered 2014-10-22 (×2): 80 ug via INTRAVENOUS
  Administered 2014-10-22: 40 ug via INTRAVENOUS
  Administered 2014-10-22: 80 ug via INTRAVENOUS

## 2014-10-22 MED ORDER — NEBIVOLOL HCL 10 MG PO TABS
20.0000 mg | ORAL_TABLET | Freq: Every day | ORAL | Status: DC
  Filled 2014-10-22 (×2): qty 2

## 2014-10-22 SURGICAL SUPPLY — 74 items
ALLOGRAFT TUTOPLAST AXIS 6X12 (Tissue) IMPLANT
BAG DECANTER FOR FLEXI CONT (MISCELLANEOUS) ×1 IMPLANT
BLADE SURG 15 STRL LF C SS BP (BLADE) ×2 IMPLANT
BLADE SURG 15 STRL SS (BLADE) ×3
CANISTER SUCT 3000ML (MISCELLANEOUS) ×5 IMPLANT
CLOTH BEACON ORANGE TIMEOUT ST (SAFETY) ×3 IMPLANT
COUNTER NEEDLE 1200 MAGNETIC (NEEDLE) ×1 IMPLANT
COVER BACK TABLE 60X90IN (DRAPES) ×3 IMPLANT
DECANTER SPIKE VIAL GLASS SM (MISCELLANEOUS) ×2 IMPLANT
DEVICE CAPIO SLIM SINGLE (INSTRUMENTS) ×1 IMPLANT
DRAIN PENROSE 1/4X12 LTX (DRAIN) ×3 IMPLANT
DRAPE SHEET LG 3/4 BI-LAMINATE (DRAPES) ×1 IMPLANT
DRAPE UNDERBUTTOCKS STRL (DRAPE) ×3 IMPLANT
DRESSING OPSITE X SMALL 2X3 (GAUZE/BANDAGES/DRESSINGS) ×1 IMPLANT
DRSG COVADERM PLUS 2X2 (GAUZE/BANDAGES/DRESSINGS) ×4 IMPLANT
DRSG OPSITE POSTOP 3X4 (GAUZE/BANDAGES/DRESSINGS) ×1 IMPLANT
DURAPREP 26ML APPLICATOR (WOUND CARE) ×3 IMPLANT
ELECT LIGASURE LONG (ELECTRODE) IMPLANT
ELECT LIGASURE SHORT 9 REUSE (ELECTRODE) ×1 IMPLANT
ELECT REM PT RETURN 9FT ADLT (ELECTROSURGICAL) ×3
ELECTRODE REM PT RTRN 9FT ADLT (ELECTROSURGICAL) IMPLANT
GAUZE PACKING 2X5 YD STRL (GAUZE/BANDAGES/DRESSINGS) ×1 IMPLANT
GAUZE SPONGE 4X4 16PLY XRAY LF (GAUZE/BANDAGES/DRESSINGS) ×8 IMPLANT
GLOVE BIO SURGEON STRL SZ 6.5 (GLOVE) ×3 IMPLANT
GLOVE BIO SURGEON STRL SZ7.5 (GLOVE) ×3 IMPLANT
GLOVE BIO SURGEON STRL SZ8 (GLOVE) ×6 IMPLANT
GLOVE BIOGEL PI IND STRL 6.5 (GLOVE) ×2 IMPLANT
GLOVE BIOGEL PI IND STRL 7.0 (GLOVE) ×6 IMPLANT
GLOVE BIOGEL PI INDICATOR 6.5 (GLOVE) ×1
GLOVE BIOGEL PI INDICATOR 7.0 (GLOVE) ×3
GOWN STRL REUS W/TWL LRG LVL3 (GOWN DISPOSABLE) ×12 IMPLANT
LIQUID BAND (GAUZE/BANDAGES/DRESSINGS) ×5 IMPLANT
NDL MAYO 6 CRC TAPER PT (NEEDLE) IMPLANT
NEEDLE HYPO 22GX1.5 SAFETY (NEEDLE) ×3 IMPLANT
NEEDLE MAYO 6 CRC TAPER PT (NEEDLE) IMPLANT
NS IRRIG 1000ML POUR BTL (IV SOLUTION) ×6 IMPLANT
PACK LAVH (CUSTOM PROCEDURE TRAY) ×3 IMPLANT
PACK ROBOTIC GOWN (GOWN DISPOSABLE) ×3 IMPLANT
PACK VAGINAL WOMENS (CUSTOM PROCEDURE TRAY) IMPLANT
PAD POSITIONER PINK NONSTERILE (MISCELLANEOUS) ×3 IMPLANT
PENCIL BUTTON HOLSTER BLD 10FT (ELECTRODE) ×2 IMPLANT
PLUG CATH AND CAP STER (CATHETERS) ×3 IMPLANT
PROTECTOR NERVE ULNAR (MISCELLANEOUS) ×3 IMPLANT
RETRACTOR STAY HOOK 5MM (MISCELLANEOUS) ×3 IMPLANT
SEALER TISSUE G2 CVD JAW 45CM (ENDOMECHANICALS) ×3 IMPLANT
SET CYSTO W/LG BORE CLAMP LF (SET/KITS/TRAYS/PACK) ×3 IMPLANT
SET IRRIG TUBING LAPAROSCOPIC (IRRIGATION / IRRIGATOR) IMPLANT
SHEET LAVH (DRAPES) ×3 IMPLANT
SLEEVE XCEL OPT CAN 5 100 (ENDOMECHANICALS) IMPLANT
SUT CAPIO ETHIBPND (SUTURE) ×2 IMPLANT
SUT MNCRL 0 MO-4 VIOLET 18 CR (SUTURE) ×4 IMPLANT
SUT MNCRL 0 VIOLET 6X18 (SUTURE) ×2 IMPLANT
SUT MON AB 2-0 CT1 36 (SUTURE) ×3 IMPLANT
SUT MONOCRYL 0 6X18 (SUTURE) ×1
SUT MONOCRYL 0 MO 4 18  CR/8 (SUTURE) ×2
SUT SILK 2 0 FS (SUTURE) IMPLANT
SUT VIC AB 0 CT1 27 (SUTURE) ×12
SUT VIC AB 0 CT1 27XBRD ANBCTR (SUTURE) IMPLANT
SUT VIC AB 0 CT2 27 (SUTURE) IMPLANT
SUT VIC AB 2-0 CT1 (SUTURE) ×8 IMPLANT
SUT VIC AB 2-0 SH 27 (SUTURE) ×15
SUT VIC AB 2-0 SH 27XBRD (SUTURE) ×8 IMPLANT
SUT VIC AB 3-0 PS2 18 (SUTURE) ×3
SUT VIC AB 3-0 PS2 18XBRD (SUTURE) ×2 IMPLANT
SUT VICRYL 0 UR6 27IN ABS (SUTURE) ×5 IMPLANT
TOWEL OR 17X24 6PK STRL BLUE (TOWEL DISPOSABLE) ×12 IMPLANT
TRAY FOLEY CATH SILVER 14FR (SET/KITS/TRAYS/PACK) ×6 IMPLANT
TROCAR OPTI TIP 5M 100M (ENDOMECHANICALS) ×3 IMPLANT
TROCAR XCEL NON-BLD 11X100MML (ENDOMECHANICALS) ×3 IMPLANT
TUBING NON-CON 1/4 X 20 CONN (TUBING) ×4 IMPLANT
TUTOPLAST AXIS 6X12 (Tissue) ×3 IMPLANT
WARMER LAPAROSCOPE (MISCELLANEOUS) ×3 IMPLANT
WATER STERILE IRR 1000ML POUR (IV SOLUTION) ×6 IMPLANT
YANKAUER SUCT BULB TIP NO VENT (SUCTIONS) ×1 IMPLANT

## 2014-10-22 NOTE — Op Note (Signed)
Gyn op noted dictated EBL 50cc

## 2014-10-22 NOTE — Anesthesia Procedure Notes (Addendum)
Procedure Name: Intubation Date/Time: 10/22/2014 7:35 AM Performed by: Raenette Rover Pre-anesthesia Checklist: Patient identified, Emergency Drugs available, Suction available and Patient being monitored Patient Re-evaluated:Patient Re-evaluated prior to inductionOxygen Delivery Method: Circle system utilized Preoxygenation: Pre-oxygenation with 100% oxygen Intubation Type: IV induction Ventilation: Mask ventilation without difficulty Laryngoscope Size: Mac and 3 Grade View: Grade II Tube type: Oral Tube size: 7.0 mm Number of attempts: 1 Airway Equipment and Method: Stylet Placement Confirmation: ETT inserted through vocal cords under direct vision,  positive ETCO2,  breath sounds checked- equal and bilateral and CO2 detector Secured at: 20 cm Tube secured with: Tape Dental Injury: Teeth and Oropharynx as per pre-operative assessment

## 2014-10-22 NOTE — Op Note (Signed)
NAMEZION, LINT NO.:  [id number]  MEDICAL RECORD NO.:  [id number]  LOCATION:  [facility]                          FACILITY:  [facility]  PHYSICIAN:  Marylynn Pearson, MD    DATE OF BIRTH:  02-Jun-1946  DATE OF PROCEDURE:  10/22/2014 DATE OF DISCHARGE:                              OPERATIVE REPORT   PREOPERATIVE DIAGNOSIS:  Pelvic organ prolapse.  POSTOPERATIVE DIAGNOSIS:  Pelvic organ prolapse.  PROCEDURE:  Laparoscopic-assisted vaginal hysterectomy with bilateral salpingo-oophorectomy.  SURGEON:  Marylynn Pearson, MD.  ASSISTANT:  Darlyn Chamber, MD.  BLOOD LOSS:  50 mL.  SPECIMENS:  Uterus with bilateral fallopian tubes and ovaries.  PROCEDURE IN DETAIL:  The patient was taken to the operating room after informed consent was obtained.  She was given general anesthesia, placed in the dorsal lithotomy position using Allen stirrups.  She was prepped and draped in sterile fashion and a Foley catheter was inserted sterilely.  Bivalve speculum was placed in the vagina and a Hulka clamp was placed on the cervix.  Speculum was removed and our attention was turned to the abdomen.  Marcaine 0.25% was injected at the site of our infraumbilical and suprapubic incisions.  A scalpel was used to make an infraumbilical skin incision and an optical trocar was inserted under direct visualization. Once intraperitoneal placement was confirmed, CO2 was turned on and a survey was performed.  Uterus, bilateral ovaries, and fallopian tubes appeared normal.  Right upper quadrant appeared normal.  Appendix was not visualized.  A suprapubic incision was made with a scalpel and a 5 mm trocar was then inserted under direct visualization.  An atraumatic grasper was inserted and the right adnexa was grasped, tented upwards and toward the midline.  Enseal device was used to grasp, cauterize, and cut.  The infundibulopelvic ligament incision was extended down the mesosalpinx to the level of  the round ligament.  The round ligament was grasped, cauterized, and cut with the enseal.  Excellent hemostasis was noted and this procedure was repeated on the left side.  Bilateral adnexa were freed to the level of the round ligament.  All instruments were then removed from the abdomen and our attention was turned to the pelvis.  A weighted speculum was placed in the posterior vagina and a Deaver was placed anteriorly.  A circumferential incision was made around the cervix using the scalpel.  Posterior cul-de-sac was entered sharply using curved Mayo scissors and a long weighted speculum was placed in the posterior cul-de-sac.  Bilateral uterosacral ligaments were grasped with curved Heaney clamps, cut, and suture ligated.  The anterior cul-de- sac was then entered sharply using Metzenbaum scissors and a Deaver was placed anteriorly.  The cervicopubic fascia had been dissected off the cervix anteriorly.  Bilateral uterine arteries and cardinal ligaments were then grasped with Haney clamps, cut, and suture ligated. Hemostasis was noted.  The fundus of the uterus was then grasped with a toothed tenaculum and delivered through the posterior cul-de-sac. Remaining pedicles were grasped with curved Heaney clamps, and the uterus was amputated.  Free ties were used to suture ligate these pedicles and hemostasis was achieved.  Monocryl was used to run the posterior  vaginal cuff using a running lock suture.  Excellent hemostasis was noted and our attention was turned to the abdomen.  A deep stitch was placed in the infraumbilical incision and the skin of both the infraumbilical and suprapubic incisions were closed with Vicryl and Dermabond was placed.  The sponge, lap, and needle counts were correct x2.  The vaginal cuff was left open for Dr. Vikki Ports to follow with vaginal repair and sling procedure.     Marylynn Pearson, MD     GA/MEDQ  D:  10/22/2014  T:  10/22/2014  Job:  [id number]

## 2014-10-22 NOTE — Addendum Note (Signed)
Addendum  created 10/22/14 2110 by Flossie Dibble, CRNA   Modules edited: Notes Section   Notes Section:  File: [id number]

## 2014-10-22 NOTE — Anesthesia Postprocedure Evaluation (Signed)
Anesthesia Post Note  Patient: Faith Gaines  Procedure(s) Performed: Procedure(s) with comments: LAPAROSCOPIC ASSISTED VAGINAL HYSTERECTOMY (N/A) - Pam from Dr. Mikle Bosworth office will call and add additional surgery for this patient in our block time. SALPINGO OOPHORECTOMY (Bilateral) CYSTO, REPAIR CYSTOCELE/RECTOCELE (N/A) VAULT PROLAPSE WITH GRAFT (N/A)  Anesthesia type: General  Patient location: [facility]  Post pain: Pain level controlled  Post assessment: Post-op Vital signs reviewed  Last Vitals: BP 106/48 mmHg  Pulse 89  Temp(Src) 37.4 C (Oral)  Resp 16  Ht 5' (1.524 m)  Wt 126 lb (57.153 kg)  BMI 24.61 kg/m2  SpO2 96%  Post vital signs: Reviewed  Level of consciousness: awake  Complications: No apparent anesthesia complications

## 2014-10-22 NOTE — Anesthesia Postprocedure Evaluation (Signed)
Anesthesia Post Note  Patient: Faith Gaines  Procedure(s) Performed: Procedure(s) (LRB): LAPAROSCOPIC ASSISTED VAGINAL HYSTERECTOMY (N/A) SALPINGO OOPHORECTOMY (Bilateral) CYSTO, REPAIR CYSTOCELE/RECTOCELE (N/A) VAULT PROLAPSE WITH GRAFT (N/A)  Anesthesia type: General  Patient location: PACU  Post pain: Pain level controlled  Post assessment: Post-op Vital signs reviewed  Last Vitals:  Filed Vitals:   10/22/14 1330  BP: 123/49  Pulse: 87  Temp:   Resp: 17    Post vital signs: Reviewed  Level of consciousness: sedated  Complications: No apparent anesthesia complications. ECG does show ST depression in leads V5 and V6 so will r/o MI.

## 2014-10-22 NOTE — Anesthesia Postprocedure Evaluation (Signed)
Anesthesia Post Note  Patient: Faith Gaines  Procedure(s) Performed: Procedure(s) (LRB): LAPAROSCOPIC ASSISTED VAGINAL HYSTERECTOMY (N/A) SALPINGO OOPHORECTOMY (Bilateral) CYSTO, REPAIR CYSTOCELE/RECTOCELE (N/A) VAULT PROLAPSE WITH GRAFT (N/A)  Anesthesia type: General  Patient location: PACU  Post pain: Pain level controlled  Post assessment: Post-op Vital signs reviewed  Last Vitals:  Filed Vitals:   10/22/14 1245  BP: 123/50  Pulse: 77  Temp:   Resp: 17    Post vital signs: Reviewed  Level of consciousness: sedated  Complications: No apparent anesthesia complications

## 2014-10-22 NOTE — Transfer of Care (Signed)
Immediate Anesthesia Transfer of Care Note  Patient: Faith Gaines  Procedure(s) Performed: Procedure(s) with comments: LAPAROSCOPIC ASSISTED VAGINAL HYSTERECTOMY (N/A) - Pam from Dr. Mikle Bosworth office will call and add additional surgery for this patient in our block time. SALPINGO OOPHORECTOMY (Bilateral) CYSTO, REPAIR CYSTOCELE/RECTOCELE (N/A) VAULT PROLAPSE WITH GRAFT (N/A)  Patient Location: PACU  Anesthesia Type:General  Level of Consciousness: awake, alert , oriented and patient cooperative  Airway & Oxygen Therapy: Patient Spontanous Breathing and Patient connected to nasal cannula oxygen  Post-op Assessment: Report given to RN and Post -op Vital signs reviewed and stable  Post vital signs: Reviewed and stable  Last Vitals:  Filed Vitals:   10/22/14 0611  BP: 146/58  Pulse: 72  Temp: 37 C  Resp: 20    Complications: No apparent anesthesia complications

## 2014-10-22 NOTE — Op Note (Signed)
Preoperative diagnosis: Vault prolapse and cystocele and rectocele Postoperative diagnosis: Vault prolapse and cystocele and rectocele Surgery: Vault prolapse repair and cystocele repair and graft a rectocele repair and cystoscopy Surgeon: Dr. Nicki Reaper Orey Moure Assistant Dr. Amaryllis Dyke  The patient has the above diagnoses and consented to the above procedure. Gynecology had performed a hysterectomy prior. Dr. Julien Girt had run the posterior cuff. The ureteral sacral ligaments were not tagged. Landmarks were identified. She had moderate descensus of the vaginal cuff with a moderate central defect. I spent a little bit a time with leg positioning to minimize the risk of compartment syndrome and neuropathy and deep vein thrombosis.  I instilled 20 mL of a lidocaine epinephrine mixture. Between a number of Allis clamps and made a long anterior vaginal wall incision and sharply mobilized the underlying pubocervical fascia from the overlying mucosa to the white line bilaterally. I mobilized sharply at the apex near the ureteral sacral ligaments appropriately.  Without distorting the anatomy I did an anterior repair imbricating the cystocele with 2 layers of 2-0 Vicryl and imbricating the bladder neck.  I cystoscoped the patient. She had a large capacity bladder. There was good evidence of the cystocele repair. There is no distortion of the ureteral orifices. There was excellent yellow jets bilaterally  I finger dissected to the ischial spine bilaterally causing no bleeding. I mobilized soft tissue medially very nicely. I used an Capio device to place a 0 Ethibond 1 full finger breath medial to the spine in a straight line between the spines on both sides. Rectal examination demonstrated excellent location of the well situated apical sutures and no rectal injury  I mobilized appropriately the urethrovesical angle and placed a 0 Vicryl on a UR 6 needle bilaterally  I cut a 10 x 6 piece of Tutoplast in the  shape of a trapezoid and was sutured in place between my 4 sutures. It was tension-free. It went in very nicely  I trimmed an appropriate amount of anterior vaginal wall and closed the anterior vaginal wall with running 2-0 Vicryl on a CT1 needle. I closed the apex with 0 Vicryl from left to right and right to left started at both apices  A rectal examination demonstrated a long posterior vaginal wall with a diffuse grade 2 rectocele needing repair  I instilled 18 mL of a lidocaine epinephrine mixture. I placed an Allis clamp on the hymenal ring bilaterally and removed a small triangle perineal skin. I made a long posterior incision all the way to the apex using allis clamps. I sharply mobilized the posterior vaginal wall from the underlying rectovaginal fascia to the sidewall bilaterally.  A repeat rectal examination did demonstrated no true site defect but diffuse thinning of the fascia  From the apex caudally I did a 2 layer posterior repair with 2-0 Vicryl. Repeat rectal examination demonstrated excellent repair with no injury  I only trimmed a few millimeters of posterior vaginal wall and closed the posterior vaginal wall with running 2-0 Vicryl on a CT1 needle. It was exteriorized at the introitus. I used 1 gentle 0 Vicryl perineal repair. I closed the perineum subcuticularly.  The patient had excellent vaginal length and volume at the of the case. Blood loss was less than 100 mL. Leg position was excellent. One vaginal pack with Estrace cream was applied  Hopefully the operation will reach the patient's treatment goal

## 2014-10-22 NOTE — Addendum Note (Signed)
Addendum  created 10/22/14 1355 by Lyn Hollingshead, MD   Modules edited: Notes Section   Notes Section:  File: [id number]

## 2014-10-22 NOTE — Progress Notes (Signed)
Day of Surgery Procedure(s) (LRB): LAPAROSCOPIC ASSISTED VAGINAL HYSTERECTOMY (N/A) SALPINGO OOPHORECTOMY (Bilateral) CYSTO, REPAIR CYSTOCELE/RECTOCELE (N/A) VAULT PROLAPSE WITH GRAFT (N/A)  Subjective: Patient reports tolerating PO.  Pt denies CP and SOB.  Reports good pain control.  No vb.  Pt was noted to have some ECG changes while in PACU (ST depression in leads V5 & V6) per anesthesia compared to her pre op ECG and therefore r/o MI labs were ordered by Dr Jillyn Hidden.  First set WNL.  Pt has h/o codeine allergy but reports she can take percocet/vicodin without complications  Objective: I have reviewed patient's vital signs, intake and output, medications and labs.  General: alert and cooperative GI: normal findings: soft, non-tender Extremities: extremities normal, atraumatic, no cyanosis or edema Vaginal Bleeding: none  Assessment: s/p Procedure(s) with comments: LAPAROSCOPIC ASSISTED VAGINAL HYSTERECTOMY (N/A) - Pam from Dr. Mikle Bosworth office will call and add additional surgery for this patient in our block time. SALPINGO OOPHORECTOMY (Bilateral) CYSTO, REPAIR CYSTOCELE/RECTOCELE (N/A) VAULT PROLAPSE WITH GRAFT (N/A): stable  Plan: Advance diet Encourage ambulation Continue foley due to urinary output monitoring  Will consult cardiology while in hospital       Faith Gaines 10/22/2014, 5:16 PM

## 2014-10-23 ENCOUNTER — Encounter (HOSPITAL_COMMUNITY): Payer: Self-pay | Admitting: Obstetrics and Gynecology

## 2014-10-23 DIAGNOSIS — I129 Hypertensive chronic kidney disease with stage 1 through stage 4 chronic kidney disease, or unspecified chronic kidney disease: Secondary | ICD-10-CM | POA: Diagnosis not present

## 2014-10-23 DIAGNOSIS — N183 Chronic kidney disease, stage 3 (moderate): Secondary | ICD-10-CM | POA: Diagnosis not present

## 2014-10-23 DIAGNOSIS — N3941 Urge incontinence: Secondary | ICD-10-CM | POA: Diagnosis not present

## 2014-10-23 DIAGNOSIS — N814 Uterovaginal prolapse, unspecified: Secondary | ICD-10-CM | POA: Diagnosis not present

## 2014-10-23 DIAGNOSIS — E119 Type 2 diabetes mellitus without complications: Secondary | ICD-10-CM | POA: Diagnosis not present

## 2014-10-23 DIAGNOSIS — E785 Hyperlipidemia, unspecified: Secondary | ICD-10-CM | POA: Diagnosis not present

## 2014-10-23 LAB — CK TOTAL AND CKMB (NOT AT ARMC)
CK, MB: 3.2 ng/mL (ref 0.3–4.0)
Relative Index: INVALID (ref 0.0–2.5)
Total CK: 79 U/L (ref 7–177)

## 2014-10-23 LAB — GLUCOSE, CAPILLARY: Glucose-Capillary: 167 mg/dL — ABNORMAL HIGH (ref 70–99)

## 2014-10-23 LAB — TROPONIN I: Troponin I: <0.03 ng/mL (ref <0.031)

## 2014-10-23 MED ORDER — OXYCODONE-ACETAMINOPHEN 5-325 MG PO TABS: 1.0000 | ORAL_TABLET | ORAL | Status: DC | PRN

## 2014-10-23 NOTE — Progress Notes (Signed)
Pt is discharged in the care Daugther with R.N. Escort. Denies any pain or discomfort. Discharged instructions with Rx  Were given to pt,with good understanding.  Questions were asked and answered. Voiding without discomfort. No equipment needed for home use.

## 2014-10-23 NOTE — Progress Notes (Signed)
EKG results d/w dr Kelly/cardiology Report read as NSR - ok for discharge

## 2014-10-23 NOTE — Progress Notes (Signed)
1 Day Post-Op Procedure(s) (LRB): LAPAROSCOPIC ASSISTED VAGINAL HYSTERECTOMY (N/A) SALPINGO OOPHORECTOMY (Bilateral) CYSTO, REPAIR CYSTOCELE/RECTOCELE (N/A) VAULT PROLAPSE WITH GRAFT (N/A)  Subjective: Patient reports tolerating PO.  Tolerating PO without N/V.  No CP or SOB.  Pain controlled.  Objective: I have reviewed patient's vital signs, intake and output, medications and labs.  General: alert and cooperative GI: normal findings: soft, non-tender Extremities: extremities normal, atraumatic, no cyanosis or edema Vaginal Bleeding: none - packing out this am  Assessment: Pt progressing will post op - expect discharge today EKG changes. - discussed with cardio yesterday and they rec rpt ECG this am.  He did not feel consult needed at this time unless pt has sx or labs abnormal     Plan: Advance diet Encourage ambulation Advance to PO medication Discontinue IV fluids plan discharge home if ECG looks ok     Aren Pryde 10/23/2014, 7:32 AM

## 2014-10-23 NOTE — Progress Notes (Signed)
Feels well Dr Julien Girt may be having cardiology see her today Pack is out Post op instructions detailed Send home when ok with Dr Julien Girt Voiding trial this am

## 2014-10-24 NOTE — Discharge Summary (Signed)
Physician Discharge Summary  Patient ID: Faith Gaines MRN: [id number] DOB/AGE: 1946/02/27 69 y.o.  Admit date: 10/22/2014 Discharge date: 10/24/2014  Admission Diagnoses:  Pelvic organ prolapse  Discharge Diagnoses:  Active Problems:   Prolapse of uterus   Discharged Condition: good  Hospital Course: Pt was admitted for postop care.  Initially, pain was controlled with IV medication but changed to PO once she was able to tolerate PO intake.  Pt remained afebrile with stable labs throughout her hospital stay.  Urology managed her foley catheter and voiding trials throughout her stay.  While in PACU, she was noted to have some EKG changes.  MI labs were negative and repeat EKG (read by dr Claiborne Billings) was normal.  Pt was felt to be stable for discharge.  Consults: None  Significant Diagnostic Studies: labs: cbc  Treatments: surgery: LAVH/BSO, vaginal repair  Discharge Exam: Blood pressure 98/37, pulse 74, temperature 98 F (36.7 C), temperature source Oral, resp. rate 18, height 5' (1.524 m), weight 126 lb (57.153 kg), SpO2 95 %. Head: Normocephalic, without obvious abnormality, atraumatic GI: normal findings: soft, non-tender Extremities: extremities normal, atraumatic, no cyanosis or edema Incision/Wound:clean, dry and intact  Disposition: 01-Home or Self Care     Medication List    TAKE these medications        aspirin EC 81 MG tablet  Take 81 mg by mouth daily.     INVOKANA 300 MG Tabs tablet  Generic drug:  canagliflozin  Take 300 mg by mouth daily.     KOMBIGLYZE XR 2.11-998 MG Tb24  Generic drug:  Saxagliptin-Metformin  Take 1 tablet by mouth 2 (two) times daily.     nebivolol 10 MG tablet  Commonly known as:  BYSTOLIC  Take 20 mg by mouth daily.     OMEGA-3 2100 1050 MG Caps  Take by mouth daily.     oxyCODONE-acetaminophen 5-325 MG per tablet  Commonly known as:  PERCOCET/ROXICET  Take 1-2 tablets by mouth every 4 (four) hours as needed for moderate pain.      pantoprazole 40 MG tablet  Commonly known as:  PROTONIX  Take 40 mg by mouth daily.           Follow-up Information    Schedule an appointment as soon as possible for a visit in 2 days to follow up.      Signed: Mckenzie Bove 10/24/2014, 7:16 AM

## 2014-10-30 DIAGNOSIS — N8111 Cystocele, midline: Secondary | ICD-10-CM | POA: Diagnosis not present

## 2014-10-30 DIAGNOSIS — N816 Rectocele: Secondary | ICD-10-CM | POA: Diagnosis not present

## 2014-11-06 DIAGNOSIS — N3946 Mixed incontinence: Secondary | ICD-10-CM | POA: Diagnosis not present

## 2014-11-06 DIAGNOSIS — N8111 Cystocele, midline: Secondary | ICD-10-CM | POA: Diagnosis not present

## 2014-11-08 ENCOUNTER — Other Ambulatory Visit: Payer: Self-pay | Admitting: Dermatology

## 2014-11-08 DIAGNOSIS — Z86018 Personal history of other benign neoplasm: Secondary | ICD-10-CM | POA: Diagnosis not present

## 2014-11-08 DIAGNOSIS — L821 Other seborrheic keratosis: Secondary | ICD-10-CM | POA: Diagnosis not present

## 2014-11-08 DIAGNOSIS — C44719 Basal cell carcinoma of skin of left lower limb, including hip: Secondary | ICD-10-CM | POA: Diagnosis not present

## 2014-11-08 DIAGNOSIS — Z85820 Personal history of malignant melanoma of skin: Secondary | ICD-10-CM | POA: Diagnosis not present

## 2014-11-08 DIAGNOSIS — D225 Melanocytic nevi of trunk: Secondary | ICD-10-CM | POA: Diagnosis not present

## 2014-11-08 DIAGNOSIS — L309 Dermatitis, unspecified: Secondary | ICD-10-CM | POA: Diagnosis not present

## 2014-11-08 DIAGNOSIS — Z85828 Personal history of other malignant neoplasm of skin: Secondary | ICD-10-CM | POA: Diagnosis not present

## 2014-11-08 DIAGNOSIS — D485 Neoplasm of uncertain behavior of skin: Secondary | ICD-10-CM | POA: Diagnosis not present

## 2014-11-16 DIAGNOSIS — E119 Type 2 diabetes mellitus without complications: Secondary | ICD-10-CM | POA: Diagnosis not present

## 2014-11-16 DIAGNOSIS — D509 Iron deficiency anemia, unspecified: Secondary | ICD-10-CM | POA: Diagnosis not present

## 2014-11-16 DIAGNOSIS — R197 Diarrhea, unspecified: Secondary | ICD-10-CM | POA: Diagnosis not present

## 2014-11-16 DIAGNOSIS — R5383 Other fatigue: Secondary | ICD-10-CM | POA: Diagnosis not present

## 2014-11-20 DIAGNOSIS — C50512 Malignant neoplasm of lower-outer quadrant of left female breast: Secondary | ICD-10-CM | POA: Diagnosis not present

## 2014-12-06 DIAGNOSIS — C44719 Basal cell carcinoma of skin of left lower limb, including hip: Secondary | ICD-10-CM | POA: Diagnosis not present

## 2014-12-06 DIAGNOSIS — L659 Nonscarring hair loss, unspecified: Secondary | ICD-10-CM | POA: Diagnosis not present

## 2015-01-25 DIAGNOSIS — N8111 Cystocele, midline: Secondary | ICD-10-CM | POA: Diagnosis not present

## 2015-01-25 DIAGNOSIS — R350 Frequency of micturition: Secondary | ICD-10-CM | POA: Diagnosis not present

## 2015-02-06 ENCOUNTER — Encounter: Payer: Self-pay | Admitting: Cardiology

## 2015-02-22 ENCOUNTER — Other Ambulatory Visit: Payer: Self-pay | Admitting: Oncology

## 2015-02-22 DIAGNOSIS — Z853 Personal history of malignant neoplasm of breast: Secondary | ICD-10-CM

## 2015-02-22 DIAGNOSIS — M8580 Other specified disorders of bone density and structure, unspecified site: Secondary | ICD-10-CM | POA: Diagnosis not present

## 2015-02-22 DIAGNOSIS — Z85820 Personal history of malignant melanoma of skin: Secondary | ICD-10-CM | POA: Diagnosis not present

## 2015-02-22 DIAGNOSIS — Z9889 Other specified postprocedural states: Secondary | ICD-10-CM

## 2015-04-30 DIAGNOSIS — E119 Type 2 diabetes mellitus without complications: Secondary | ICD-10-CM | POA: Diagnosis not present

## 2015-05-16 DIAGNOSIS — M8580 Other specified disorders of bone density and structure, unspecified site: Secondary | ICD-10-CM | POA: Diagnosis not present

## 2015-05-16 DIAGNOSIS — E782 Mixed hyperlipidemia: Secondary | ICD-10-CM | POA: Diagnosis not present

## 2015-05-16 DIAGNOSIS — I10 Essential (primary) hypertension: Secondary | ICD-10-CM | POA: Diagnosis not present

## 2015-05-16 DIAGNOSIS — K219 Gastro-esophageal reflux disease without esophagitis: Secondary | ICD-10-CM | POA: Diagnosis not present

## 2015-05-16 DIAGNOSIS — E1129 Type 2 diabetes mellitus with other diabetic kidney complication: Secondary | ICD-10-CM | POA: Diagnosis not present

## 2015-05-16 DIAGNOSIS — Z0000 Encounter for general adult medical examination without abnormal findings: Secondary | ICD-10-CM | POA: Diagnosis not present

## 2015-05-16 DIAGNOSIS — C50919 Malignant neoplasm of unspecified site of unspecified female breast: Secondary | ICD-10-CM | POA: Diagnosis not present

## 2015-05-16 DIAGNOSIS — E1149 Type 2 diabetes mellitus with other diabetic neurological complication: Secondary | ICD-10-CM | POA: Diagnosis not present

## 2015-05-16 DIAGNOSIS — Z1389 Encounter for screening for other disorder: Secondary | ICD-10-CM | POA: Diagnosis not present

## 2015-05-16 DIAGNOSIS — N183 Chronic kidney disease, stage 3 (moderate): Secondary | ICD-10-CM | POA: Diagnosis not present

## 2015-05-16 DIAGNOSIS — D509 Iron deficiency anemia, unspecified: Secondary | ICD-10-CM | POA: Diagnosis not present

## 2015-05-16 DIAGNOSIS — Z23 Encounter for immunization: Secondary | ICD-10-CM | POA: Diagnosis not present

## 2015-06-20 ENCOUNTER — Ambulatory Visit
Admission: RE | Admit: 2015-06-20 | Discharge: 2015-06-20 | Disposition: A | Discharge status: Home / Self Care | Payer: Medicare Other | Source: Ambulatory Visit | Attending: Oncology | Admitting: Oncology

## 2015-06-20 DIAGNOSIS — M859 Disorder of bone density and structure, unspecified: Secondary | ICD-10-CM | POA: Diagnosis not present

## 2015-06-20 DIAGNOSIS — Z9889 Other specified postprocedural states: Secondary | ICD-10-CM

## 2015-06-20 DIAGNOSIS — Z853 Personal history of malignant neoplasm of breast: Secondary | ICD-10-CM

## 2015-06-20 DIAGNOSIS — R928 Other abnormal and inconclusive findings on diagnostic imaging of breast: Secondary | ICD-10-CM | POA: Diagnosis not present

## 2015-06-20 DIAGNOSIS — M8589 Other specified disorders of bone density and structure, multiple sites: Secondary | ICD-10-CM | POA: Diagnosis not present

## 2015-06-21 DIAGNOSIS — D509 Iron deficiency anemia, unspecified: Secondary | ICD-10-CM | POA: Diagnosis not present

## 2015-07-01 DIAGNOSIS — M810 Age-related osteoporosis without current pathological fracture: Secondary | ICD-10-CM | POA: Diagnosis not present

## 2015-07-01 DIAGNOSIS — K625 Hemorrhage of anus and rectum: Secondary | ICD-10-CM

## 2015-07-01 DIAGNOSIS — Z170 Estrogen receptor positive status [ER+]: Secondary | ICD-10-CM | POA: Diagnosis not present

## 2015-07-01 DIAGNOSIS — D0512 Intraductal carcinoma in situ of left breast: Secondary | ICD-10-CM | POA: Diagnosis not present

## 2015-07-01 DIAGNOSIS — Z85820 Personal history of malignant melanoma of skin: Secondary | ICD-10-CM

## 2015-07-01 DIAGNOSIS — Z853 Personal history of malignant neoplasm of breast: Secondary | ICD-10-CM | POA: Diagnosis not present

## 2015-07-01 DIAGNOSIS — D509 Iron deficiency anemia, unspecified: Secondary | ICD-10-CM | POA: Diagnosis not present

## 2015-07-03 DIAGNOSIS — D509 Iron deficiency anemia, unspecified: Secondary | ICD-10-CM | POA: Diagnosis not present

## 2015-07-04 DIAGNOSIS — D509 Iron deficiency anemia, unspecified: Secondary | ICD-10-CM | POA: Diagnosis not present

## 2015-07-04 DIAGNOSIS — R1013 Epigastric pain: Secondary | ICD-10-CM | POA: Diagnosis not present

## 2015-07-04 DIAGNOSIS — K625 Hemorrhage of anus and rectum: Secondary | ICD-10-CM | POA: Diagnosis not present

## 2015-07-04 DIAGNOSIS — Z1211 Encounter for screening for malignant neoplasm of colon: Secondary | ICD-10-CM | POA: Diagnosis not present

## 2015-07-10 DIAGNOSIS — D509 Iron deficiency anemia, unspecified: Secondary | ICD-10-CM | POA: Diagnosis not present

## 2015-07-30 ENCOUNTER — Encounter (HOSPITAL_COMMUNITY): Payer: Medicare Other

## 2015-07-30 ENCOUNTER — Ambulatory Visit: Payer: Medicare Other | Admitting: Family

## 2015-07-31 DIAGNOSIS — R1013 Epigastric pain: Secondary | ICD-10-CM | POA: Diagnosis not present

## 2015-07-31 DIAGNOSIS — Z1211 Encounter for screening for malignant neoplasm of colon: Secondary | ICD-10-CM | POA: Diagnosis not present

## 2015-07-31 DIAGNOSIS — D509 Iron deficiency anemia, unspecified: Secondary | ICD-10-CM | POA: Diagnosis not present

## 2015-07-31 DIAGNOSIS — K2970 Gastritis, unspecified, without bleeding: Secondary | ICD-10-CM | POA: Diagnosis not present

## 2015-07-31 DIAGNOSIS — K3189 Other diseases of stomach and duodenum: Secondary | ICD-10-CM | POA: Diagnosis not present

## 2015-08-12 ENCOUNTER — Encounter: Payer: Self-pay | Admitting: Family

## 2015-08-13 DIAGNOSIS — R1012 Left upper quadrant pain: Secondary | ICD-10-CM | POA: Diagnosis not present

## 2015-08-13 DIAGNOSIS — R140 Abdominal distension (gaseous): Secondary | ICD-10-CM | POA: Diagnosis not present

## 2015-08-13 DIAGNOSIS — K219 Gastro-esophageal reflux disease without esophagitis: Secondary | ICD-10-CM | POA: Diagnosis not present

## 2015-08-21 ENCOUNTER — Encounter: Payer: Self-pay | Admitting: Family

## 2015-08-21 ENCOUNTER — Ambulatory Visit (INDEPENDENT_AMBULATORY_CARE_PROVIDER_SITE_OTHER): Payer: Medicare Other | Admitting: Family

## 2015-08-21 ENCOUNTER — Ambulatory Visit (HOSPITAL_COMMUNITY)
Admission: RE | Admit: 2015-08-21 | Discharge: 2015-08-21 | Disposition: A | Discharge status: Home / Self Care | Payer: Medicare Other | Source: Ambulatory Visit | Attending: Family | Admitting: Family

## 2015-08-21 VITALS — BP 144/76 | HR 69 | Resp 14 | Ht 61.0 in | Wt 125.0 lb

## 2015-08-21 DIAGNOSIS — N183 Chronic kidney disease, stage 3 (moderate): Secondary | ICD-10-CM | POA: Diagnosis not present

## 2015-08-21 DIAGNOSIS — Z87891 Personal history of nicotine dependence: Secondary | ICD-10-CM | POA: Diagnosis not present

## 2015-08-21 DIAGNOSIS — Z9889 Other specified postprocedural states: Secondary | ICD-10-CM

## 2015-08-21 DIAGNOSIS — E1122 Type 2 diabetes mellitus with diabetic chronic kidney disease: Secondary | ICD-10-CM | POA: Insufficient documentation

## 2015-08-21 DIAGNOSIS — I6522 Occlusion and stenosis of left carotid artery: Secondary | ICD-10-CM | POA: Insufficient documentation

## 2015-08-21 DIAGNOSIS — I6523 Occlusion and stenosis of bilateral carotid arteries: Secondary | ICD-10-CM

## 2015-08-21 DIAGNOSIS — Z48812 Encounter for surgical aftercare following surgery on the circulatory system: Secondary | ICD-10-CM | POA: Insufficient documentation

## 2015-08-21 DIAGNOSIS — I129 Hypertensive chronic kidney disease with stage 1 through stage 4 chronic kidney disease, or unspecified chronic kidney disease: Secondary | ICD-10-CM | POA: Diagnosis not present

## 2015-08-21 DIAGNOSIS — E785 Hyperlipidemia, unspecified: Secondary | ICD-10-CM | POA: Insufficient documentation

## 2015-08-21 NOTE — Progress Notes (Signed)
Filed Vitals:   08/21/15 1200 08/21/15 1204 08/21/15 1205  BP: 135/81 158/80 144/76  Pulse: 67 69 69  Resp: 14    Height: 5\' 1"  (1.549 m)    Weight: 125 lb (56.7 kg)    SpO2: 98%

## 2015-08-21 NOTE — Patient Instructions (Signed)
Stroke Prevention Some medical conditions and behaviors are associated with an increased chance of having a stroke. You may prevent a stroke by making healthy choices and managing medical conditions. HOW CAN I REDUCE MY RISK OF HAVING A STROKE?   Stay physically active. Get at least 30 minutes of activity on most or all days.  Do not smoke. It may also be helpful to avoid exposure to secondhand smoke.  Limit alcohol use. Moderate alcohol use is considered to be:  No more than 2 drinks per day for men.  No more than 1 drink per day for nonpregnant women.  Eat healthy foods. This involves:  Eating 5 or more servings of fruits and vegetables a day.  Making dietary changes that address high blood pressure (hypertension), high cholesterol, diabetes, or obesity.  Manage your cholesterol levels.  Making food choices that are high in fiber and low in saturated fat, trans fat, and cholesterol may control cholesterol levels.  Take any prescribed medicines to control cholesterol as directed by your health care provider.  Manage your diabetes.  Controlling your carbohydrate and sugar intake is recommended to manage diabetes.  Take any prescribed medicines to control diabetes as directed by your health care provider.  Control your hypertension.  Making food choices that are low in salt (sodium), saturated fat, trans fat, and cholesterol is recommended to manage hypertension.  Ask your health care provider if you need treatment to lower your blood pressure. Take any prescribed medicines to control hypertension as directed by your health care provider.  If you are 18-39 years of age, have your blood pressure checked every 3-5 years. If you are 40 years of age or older, have your blood pressure checked every year.  Maintain a healthy weight.  Reducing calorie intake and making food choices that are low in sodium, saturated fat, trans fat, and cholesterol are recommended to manage  weight.  Stop drug abuse.  Avoid taking birth control pills.  Talk to your health care provider about the risks of taking birth control pills if you are over 35 years old, smoke, get migraines, or have ever had a blood clot.  Get evaluated for sleep disorders (sleep apnea).  Talk to your health care provider about getting a sleep evaluation if you snore a lot or have excessive sleepiness.  Take medicines only as directed by your health care provider.  For some people, aspirin or blood thinners (anticoagulants) are helpful in reducing the risk of forming abnormal blood clots that can lead to stroke. If you have the irregular heart rhythm of atrial fibrillation, you should be on a blood thinner unless there is a good reason you cannot take them.  Understand all your medicine instructions.  Make sure that other conditions (such as anemia or atherosclerosis) are addressed. SEEK IMMEDIATE MEDICAL CARE IF:   You have sudden weakness or numbness of the face, arm, or leg, especially on one side of the body.  Your face or eyelid droops to one side.  You have sudden confusion.  You have trouble speaking (aphasia) or understanding.  You have sudden trouble seeing in one or both eyes.  You have sudden trouble walking.  You have dizziness.  You have a loss of balance or coordination.  You have a sudden, severe headache with no known cause.  You have new chest pain or an irregular heartbeat. Any of these symptoms may represent a serious problem that is an emergency. Do not wait to see if the symptoms will   go away. Get medical help at once. Call your local emergency services (911 in [place] Do not drive yourself to the hospital.   This information is not intended to replace advice given to you by your health care provider. Make sure you discuss any questions you have with your health care provider.   Document Released: 08/13/2004 Document Revised: 07/27/2014 Document Reviewed:  01/06/2013 Elsevier Interactive Patient Education 2016 Elsevier Inc.  

## 2015-08-21 NOTE — Progress Notes (Signed)
Chief Complaint: Extracranial Carotid Artery Stenosis   History of Present Illness  Faith Gaines is a 70 y.o. female patient of Dr. Kellie Simmering returns status post right carotid endarterectomy on 06/29/13 for severe asymptomatic stenosis. She has done well and did have left breast surgery in 2015 and has progressed nicely and is not require radiation therapy. This was done by Dr. Barry Dienes. She denies any lateralizing weakness, aphasia, amaurosis fugax, diplopia, blurred vision, or syncope. She does continue to have numbness anterior to her right neck incision.  She has intermittent posterior neck and forehead headaches, is not worsening or improving; she had frontal headaches before the right CEA that resolved. She denies claudication symptoms with walking, denies non healing wounds.  The patient reports New Medical or Surgical History: she had a bladder tacking, hysterectomy, and rectocele repair in April 2016. She has anemia, had 2 infusions of iron, and feels much improved since then.  She had a GI endoscopic work up for GI bleeding, no bleeding source found per pt.  Is seeing Dr. Collene Mares for chronic diarrhea.  She wakes herself up coughing the last 6-12 months which she attributes to allergies and GERD.   Pt Diabetic: Yes, almost in control last A1C was 7.0 per pt Pt smoker: former smoker, quit in 2012, smoked lightly for only 3 years after her husband died  Pt meds include: Statin : No, states her cholesterol is good ASA: Yes Other anticoagulants/antiplatelets: no    Past Medical History  Diagnosis Date  . Hyperlipidemia     diet controlled, no meds  . Hypertension   . Carotid bruit   . Trigger finger     resolved with cortisone injections  . Osteopenia   . Peripheral vascular disease ([place])     rt carotid stenosis  . GERD (gastroesophageal reflux disease)     occ  . Iron deficiency anemia   . Carotid artery occlusion   . PONV (postoperative nausea and vomiting)   .  Headache(784.0)   . Urgency of urination     urgency and frequency; loss of control at times  . Diarrhea     sees Dr. Collene Mares  . Iron deficiency anemia due to chronic blood loss   . SVD (spontaneous vaginal delivery)     x 3  . Diabetes mellitus without complication ([place])     type 2  . Pneumonia 12/2011, 05/2012    hx x2 in 2013  . CKD (chronic kidney disease), stage III     no meds, blood draws q 54mos by Dr. Alyson Ingles in [place]  . Cancer [facility])     breast Left 08/2013 surgery, melanoma  . Breast cancer ([place])   . Depression     no meds  . Anxiety     no meds    Social History Social History  Substance Use Topics  . Smoking status: Former Smoker -- 0.25 packs/day for 3 years    Types: Cigarettes    Quit date: 07/20/2010  . Smokeless tobacco: Never Used  . Alcohol Use: No    Family History Family History  Problem Relation Age of Onset  . Cancer Mother     breast, uterus, lung  . Heart attack Mother   . Stroke Mother   . Heart disease Mother     Open Heart Surgery  . Hypertension Mother   . Varicose Veins Mother   . Heart disease Father   . Heart attack Father   . Kidney disease Father   .  Hypertension Father   . Diabetes Father   . Cancer Father   . Asthma Father   . Heart attack Sister   . Heart disease Sister     Surgical History Past Surgical History  Procedure Laterality Date  . Melanoma excision  12/1980    left leg,  . Bladder surgery  05/1981    bladder tack  . Cholecystectomy  1995  . Squamous cell carcinoma excision  2012    left leg  . Endarterectomy Right 06/29/2013    Procedure: ENDARTERECTOMY CAROTID-RIGHT, WITH DACRON PATCH ANGIOPLASTY;  Surgeon: Mal Misty, MD;  Location: [facility];  Service: Vascular;  Laterality: Right;  . Appendectomy    . Tubal ligation    . Colonoscopy    . Partial mastectomy with needle localization Left 08/22/2013    Procedure: PARTIAL MASTECTOMY WITH NEEDLE LOCALIZATION;  Surgeon: Stark Klein, MD;  Location:  [facility];  Service: General;  Laterality: Left;  . Wisdom tooth extraction    . Laparoscopic assisted vaginal hysterectomy N/A 10/22/2014    Procedure: LAPAROSCOPIC ASSISTED VAGINAL HYSTERECTOMY;  Surgeon: Marylynn Pearson, MD;  Location: [facility];  Service: Gynecology;  Laterality: N/A;  Pam from Dr. Mikle Bosworth office will call and add additional surgery for this patient in our block time.  . Salpingoophorectomy Bilateral 10/22/2014    Procedure: SALPINGO OOPHORECTOMY;  Surgeon: Marylynn Pearson, MD;  Location: [facility];  Service: Gynecology;  Laterality: Bilateral;  . Anterior and posterior repair N/A 10/22/2014    Procedure: CYSTO, REPAIR CYSTOCELE/RECTOCELE;  Surgeon: Bjorn Loser, MD;  Location: [facility];  Service: Urology;  Laterality: N/A;  . Vaginal prolapse repair N/A 10/22/2014    Procedure: VAULT PROLAPSE WITH GRAFT;  Surgeon: Bjorn Loser, MD;  Location: [facility];  Service: Urology;  Laterality: N/A;    Allergies  Allergen Reactions  . Codeine Nausea And Vomiting and Other (See Comments)    Headache and vomiting  . Ace Inhibitors     Drop in GFR  . Clarithromycin     Unable to sleep   . Crestor [Rosuvastatin]     Body aches  . Lipitor [Atorvastatin]     Body aches  . Avandamet [Rosiglitazone-Metformin] Rash and Other (See Comments)    Makes her hyper    Current Outpatient Prescriptions  Medication Sig Dispense Refill  . aspirin EC 81 MG tablet Take 81 mg by mouth daily.    . Canagliflozin (INVOKANA) 300 MG TABS Take 300 mg by mouth daily.     . nebivolol (BYSTOLIC) 10 MG tablet Take 20 mg by mouth daily.     . Omega-3 Fatty Acids (OMEGA-3 2100) 1050 MG CAPS Take by mouth daily.    . pantoprazole (PROTONIX) 40 MG tablet Take 40 mg by mouth daily.    . Saxagliptin-Metformin (KOMBIGLYZE XR) 2.11-998 MG TB24 Take 1 tablet by mouth 2 (two) times daily. Reported on 08/21/2015    . oxyCODONE-acetaminophen (PERCOCET/ROXICET) 5-325 MG per tablet Take 1-2 tablets by mouth every 4 (four)  hours as needed for moderate pain. (Patient not taking: Reported on 08/21/2015) 30 tablet 0   No current facility-administered medications for this visit.    Review of Systems : See HPI for pertinent positives and negatives.  Physical Examination  Filed Vitals:   08/21/15 1200 08/21/15 1204 08/21/15 1205  BP: 135/81 158/80 144/76  Pulse: 67 69 69  Resp: 14    Height: 5\' 1"  (1.549 m)    Weight: 125 lb (56.7 kg)    SpO2: 98%  Body mass index is 23.63 kg/(m^2).  General: WDWN female in NAD GAIT: normal Eyes: PERRLA Pulmonary: Non-labored, CTAB, no rales, rhonchi, or wheezing.  Cardiac: regular rhythm, no detected murmur.  VASCULAR EXAM Carotid Bruits Right Left   Negative Negative   Aorta is not palpable. Radial pulses are 2+ palpable and equal.      LE Pulses Right Left   POPLITEAL not palpable  not palpable       PT palpable palpable       DP palpable palpable    Gastrointestinal: soft, nontender, BS WNL, no r/g,no palpable masses.  Musculoskeletal: Negative muscle atrophy/wasting. M/S 5/5 throughout, Extremities without ischemic changes.  Neurologic: A&O X 3; Appropriate Affect;  Speech is normal, loquacious.  CN 2-12 intact, Pain and light touch intact in extremities, Motor exam as listed above.          Non-Invasive Vascular Imaging CAROTID DUPLEX 08/21/2015   Right ICA: widely patent CEA site with no restenosis. Left ICA: <40% stenosis. No significant change compared to prior exam.    Assessment: KAMEYA AYDT is a 70 y.o. female who is status post right carotid endarterectomy on 06/29/13. She has no history of stroke or TIA. Today's carotid duplex suggests a widely patent right ICA which is the CEA site, with no restenosis. Left ICA with <40% stenosis. No significant change compared to prior  exam.  Plan: Follow-up in 1 year with Carotid Duplex scan.   I discussed in depth with the patient the nature of atherosclerosis, and emphasized the importance of maximal medical management including strict control of blood pressure, blood glucose, and lipid levels, obtaining regular exercise, and continued cessation of smoking.  The patient is aware that without maximal medical management the underlying atherosclerotic disease process will progress, limiting the benefit of any interventions. The patient was given information about stroke prevention and what symptoms should prompt the patient to seek immediate medical care. Thank you for allowing [place] to participate in this patient's care.  Clemon Chambers, RN, MSN, FNP-C [facility] Office: [phone]  Clinic Physician: Scot Dock  08/21/2015 12:15 PM

## 2015-08-23 DIAGNOSIS — K2970 Gastritis, unspecified, without bleeding: Secondary | ICD-10-CM | POA: Diagnosis not present

## 2015-08-23 DIAGNOSIS — D509 Iron deficiency anemia, unspecified: Secondary | ICD-10-CM | POA: Diagnosis not present

## 2015-08-23 DIAGNOSIS — M8589 Other specified disorders of bone density and structure, multiple sites: Secondary | ICD-10-CM | POA: Diagnosis not present

## 2015-08-23 DIAGNOSIS — Z85820 Personal history of malignant melanoma of skin: Secondary | ICD-10-CM | POA: Diagnosis not present

## 2015-08-23 DIAGNOSIS — R1012 Left upper quadrant pain: Secondary | ICD-10-CM | POA: Diagnosis not present

## 2015-08-23 DIAGNOSIS — Z853 Personal history of malignant neoplasm of breast: Secondary | ICD-10-CM | POA: Diagnosis not present

## 2015-09-23 DIAGNOSIS — Z853 Personal history of malignant neoplasm of breast: Secondary | ICD-10-CM | POA: Diagnosis not present

## 2015-09-23 DIAGNOSIS — D509 Iron deficiency anemia, unspecified: Secondary | ICD-10-CM | POA: Diagnosis not present

## 2015-09-23 DIAGNOSIS — R1012 Left upper quadrant pain: Secondary | ICD-10-CM | POA: Diagnosis not present

## 2015-09-24 DIAGNOSIS — K641 Second degree hemorrhoids: Secondary | ICD-10-CM | POA: Diagnosis not present

## 2015-09-24 DIAGNOSIS — K625 Hemorrhage of anus and rectum: Secondary | ICD-10-CM | POA: Diagnosis not present

## 2015-09-24 DIAGNOSIS — R1033 Periumbilical pain: Secondary | ICD-10-CM | POA: Diagnosis not present

## 2015-11-14 DIAGNOSIS — Z85820 Personal history of malignant melanoma of skin: Secondary | ICD-10-CM | POA: Diagnosis not present

## 2015-11-14 DIAGNOSIS — L821 Other seborrheic keratosis: Secondary | ICD-10-CM | POA: Diagnosis not present

## 2015-11-14 DIAGNOSIS — Z85828 Personal history of other malignant neoplasm of skin: Secondary | ICD-10-CM | POA: Diagnosis not present

## 2015-11-14 DIAGNOSIS — D225 Melanocytic nevi of trunk: Secondary | ICD-10-CM | POA: Diagnosis not present

## 2015-11-14 DIAGNOSIS — Z86018 Personal history of other benign neoplasm: Secondary | ICD-10-CM | POA: Diagnosis not present

## 2015-11-14 DIAGNOSIS — L659 Nonscarring hair loss, unspecified: Secondary | ICD-10-CM | POA: Diagnosis not present

## 2015-12-09 DIAGNOSIS — L299 Pruritus, unspecified: Secondary | ICD-10-CM | POA: Diagnosis not present

## 2015-12-09 DIAGNOSIS — L659 Nonscarring hair loss, unspecified: Secondary | ICD-10-CM | POA: Diagnosis not present

## 2015-12-09 DIAGNOSIS — L668 Other cicatricial alopecia: Secondary | ICD-10-CM | POA: Diagnosis not present

## 2015-12-19 DIAGNOSIS — N760 Acute vaginitis: Secondary | ICD-10-CM | POA: Diagnosis not present

## 2015-12-19 DIAGNOSIS — Z6823 Body mass index (BMI) 23.0-23.9, adult: Secondary | ICD-10-CM | POA: Diagnosis not present

## 2015-12-19 DIAGNOSIS — Z124 Encounter for screening for malignant neoplasm of cervix: Secondary | ICD-10-CM | POA: Diagnosis not present

## 2015-12-23 DIAGNOSIS — L299 Pruritus, unspecified: Secondary | ICD-10-CM | POA: Diagnosis not present

## 2015-12-23 DIAGNOSIS — L661 Lichen planopilaris: Secondary | ICD-10-CM | POA: Diagnosis not present

## 2015-12-25 DIAGNOSIS — I129 Hypertensive chronic kidney disease with stage 1 through stage 4 chronic kidney disease, or unspecified chronic kidney disease: Secondary | ICD-10-CM | POA: Diagnosis not present

## 2015-12-25 DIAGNOSIS — C50919 Malignant neoplasm of unspecified site of unspecified female breast: Secondary | ICD-10-CM | POA: Diagnosis not present

## 2015-12-25 DIAGNOSIS — L661 Lichen planopilaris: Secondary | ICD-10-CM | POA: Diagnosis not present

## 2015-12-25 DIAGNOSIS — K219 Gastro-esophageal reflux disease without esophagitis: Secondary | ICD-10-CM | POA: Diagnosis not present

## 2015-12-25 DIAGNOSIS — D509 Iron deficiency anemia, unspecified: Secondary | ICD-10-CM | POA: Diagnosis not present

## 2015-12-25 DIAGNOSIS — E782 Mixed hyperlipidemia: Secondary | ICD-10-CM | POA: Diagnosis not present

## 2015-12-25 DIAGNOSIS — M8580 Other specified disorders of bone density and structure, unspecified site: Secondary | ICD-10-CM | POA: Diagnosis not present

## 2015-12-25 DIAGNOSIS — E1149 Type 2 diabetes mellitus with other diabetic neurological complication: Secondary | ICD-10-CM | POA: Diagnosis not present

## 2015-12-25 DIAGNOSIS — E1129 Type 2 diabetes mellitus with other diabetic kidney complication: Secondary | ICD-10-CM | POA: Diagnosis not present

## 2015-12-25 DIAGNOSIS — Z1389 Encounter for screening for other disorder: Secondary | ICD-10-CM | POA: Diagnosis not present

## 2015-12-25 DIAGNOSIS — N183 Chronic kidney disease, stage 3 (moderate): Secondary | ICD-10-CM | POA: Diagnosis not present

## 2016-01-07 DIAGNOSIS — N762 Acute vulvitis: Secondary | ICD-10-CM | POA: Diagnosis not present

## 2016-01-20 DIAGNOSIS — M65332 Trigger finger, left middle finger: Secondary | ICD-10-CM | POA: Diagnosis not present

## 2016-01-30 DIAGNOSIS — Z170 Estrogen receptor positive status [ER+]: Secondary | ICD-10-CM | POA: Diagnosis not present

## 2016-01-30 DIAGNOSIS — Z853 Personal history of malignant neoplasm of breast: Secondary | ICD-10-CM | POA: Diagnosis not present

## 2016-01-30 DIAGNOSIS — Z862 Personal history of diseases of the blood and blood-forming organs and certain disorders involving the immune mechanism: Secondary | ICD-10-CM | POA: Diagnosis not present

## 2016-01-30 DIAGNOSIS — D509 Iron deficiency anemia, unspecified: Secondary | ICD-10-CM | POA: Diagnosis not present

## 2016-01-30 DIAGNOSIS — D0512 Intraductal carcinoma in situ of left breast: Secondary | ICD-10-CM | POA: Diagnosis not present

## 2016-02-21 DIAGNOSIS — M65332 Trigger finger, left middle finger: Secondary | ICD-10-CM | POA: Diagnosis not present

## 2016-02-21 DIAGNOSIS — M65312 Trigger thumb, left thumb: Secondary | ICD-10-CM | POA: Diagnosis not present

## 2016-03-27 DIAGNOSIS — Z5181 Encounter for therapeutic drug level monitoring: Secondary | ICD-10-CM | POA: Diagnosis not present

## 2016-03-27 DIAGNOSIS — Z79899 Other long term (current) drug therapy: Secondary | ICD-10-CM | POA: Diagnosis not present

## 2016-03-27 DIAGNOSIS — L661 Lichen planopilaris: Secondary | ICD-10-CM | POA: Diagnosis not present

## 2016-03-27 DIAGNOSIS — I781 Nevus, non-neoplastic: Secondary | ICD-10-CM | POA: Diagnosis not present

## 2016-03-27 DIAGNOSIS — L658 Other specified nonscarring hair loss: Secondary | ICD-10-CM | POA: Diagnosis not present

## 2016-03-27 DIAGNOSIS — Z85828 Personal history of other malignant neoplasm of skin: Secondary | ICD-10-CM | POA: Diagnosis not present

## 2016-04-20 DIAGNOSIS — L661 Lichen planopilaris: Secondary | ICD-10-CM | POA: Diagnosis not present

## 2016-04-20 DIAGNOSIS — Z7984 Long term (current) use of oral hypoglycemic drugs: Secondary | ICD-10-CM | POA: Diagnosis not present

## 2016-04-20 DIAGNOSIS — Z853 Personal history of malignant neoplasm of breast: Secondary | ICD-10-CM | POA: Diagnosis not present

## 2016-04-20 DIAGNOSIS — I129 Hypertensive chronic kidney disease with stage 1 through stage 4 chronic kidney disease, or unspecified chronic kidney disease: Secondary | ICD-10-CM | POA: Diagnosis not present

## 2016-04-20 DIAGNOSIS — Z1389 Encounter for screening for other disorder: Secondary | ICD-10-CM | POA: Diagnosis not present

## 2016-04-20 DIAGNOSIS — Z23 Encounter for immunization: Secondary | ICD-10-CM | POA: Diagnosis not present

## 2016-04-20 DIAGNOSIS — Z0000 Encounter for general adult medical examination without abnormal findings: Secondary | ICD-10-CM | POA: Diagnosis not present

## 2016-04-20 DIAGNOSIS — E1129 Type 2 diabetes mellitus with other diabetic kidney complication: Secondary | ICD-10-CM | POA: Diagnosis not present

## 2016-04-20 DIAGNOSIS — L600 Ingrowing nail: Secondary | ICD-10-CM | POA: Diagnosis not present

## 2016-04-20 DIAGNOSIS — N183 Chronic kidney disease, stage 3 (moderate): Secondary | ICD-10-CM | POA: Diagnosis not present

## 2016-04-30 DIAGNOSIS — E119 Type 2 diabetes mellitus without complications: Secondary | ICD-10-CM | POA: Diagnosis not present

## 2016-04-30 DIAGNOSIS — H25813 Combined forms of age-related cataract, bilateral: Secondary | ICD-10-CM | POA: Diagnosis not present

## 2016-05-28 ENCOUNTER — Other Ambulatory Visit: Payer: Self-pay | Admitting: Oncology

## 2016-05-28 DIAGNOSIS — Z853 Personal history of malignant neoplasm of breast: Secondary | ICD-10-CM

## 2016-06-25 ENCOUNTER — Ambulatory Visit
Admission: RE | Admit: 2016-06-25 | Discharge: 2016-06-25 | Disposition: A | Discharge status: Home / Self Care | Payer: Medicare Other | Source: Ambulatory Visit | Attending: Oncology | Admitting: Oncology

## 2016-06-25 DIAGNOSIS — R922 Inconclusive mammogram: Secondary | ICD-10-CM | POA: Diagnosis not present

## 2016-06-25 DIAGNOSIS — Z853 Personal history of malignant neoplasm of breast: Secondary | ICD-10-CM

## 2016-07-01 DIAGNOSIS — Z862 Personal history of diseases of the blood and blood-forming organs and certain disorders involving the immune mechanism: Secondary | ICD-10-CM | POA: Diagnosis not present

## 2016-07-01 DIAGNOSIS — L659 Nonscarring hair loss, unspecified: Secondary | ICD-10-CM | POA: Diagnosis not present

## 2016-07-01 DIAGNOSIS — Z853 Personal history of malignant neoplasm of breast: Secondary | ICD-10-CM | POA: Diagnosis not present

## 2016-07-01 DIAGNOSIS — D509 Iron deficiency anemia, unspecified: Secondary | ICD-10-CM | POA: Diagnosis not present

## 2016-07-01 DIAGNOSIS — Z86000 Personal history of in-situ neoplasm of breast: Secondary | ICD-10-CM | POA: Diagnosis not present

## 2016-08-19 DIAGNOSIS — J111 Influenza due to unidentified influenza virus with other respiratory manifestations: Secondary | ICD-10-CM | POA: Diagnosis not present

## 2016-08-25 ENCOUNTER — Ambulatory Visit (HOSPITAL_COMMUNITY): Payer: Medicare Other

## 2016-08-25 ENCOUNTER — Ambulatory Visit: Payer: Medicare Other | Admitting: Family

## 2016-09-18 ENCOUNTER — Encounter: Payer: Self-pay | Admitting: Family

## 2016-09-29 ENCOUNTER — Ambulatory Visit (INDEPENDENT_AMBULATORY_CARE_PROVIDER_SITE_OTHER): Payer: Medicare Other | Admitting: Vascular Surgery

## 2016-09-29 ENCOUNTER — Ambulatory Visit (HOSPITAL_COMMUNITY)
Admission: RE | Admit: 2016-09-29 | Discharge: 2016-09-29 | Disposition: A | Discharge status: Home / Self Care | Payer: Medicare Other | Source: Ambulatory Visit | Attending: Family | Admitting: Family

## 2016-09-29 VITALS — BP 157/65 | HR 66 | Temp 98.2°F | Resp 16 | Ht 61.0 in | Wt 127.9 lb

## 2016-09-29 DIAGNOSIS — Z9889 Other specified postprocedural states: Secondary | ICD-10-CM

## 2016-09-29 DIAGNOSIS — Z87891 Personal history of nicotine dependence: Secondary | ICD-10-CM

## 2016-09-29 DIAGNOSIS — I6522 Occlusion and stenosis of left carotid artery: Secondary | ICD-10-CM | POA: Insufficient documentation

## 2016-09-29 DIAGNOSIS — I6523 Occlusion and stenosis of bilateral carotid arteries: Secondary | ICD-10-CM | POA: Diagnosis not present

## 2016-09-29 DIAGNOSIS — Z48812 Encounter for surgical aftercare following surgery on the circulatory system: Secondary | ICD-10-CM

## 2016-09-29 LAB — VAS US CAROTID
LEFT ECA DIAS: -9 cm/s
Left CCA dist dias: 16 cm/s
Left CCA dist sys: 73 cm/s
Left CCA prox dias: 16 cm/s
Left CCA prox sys: 97 cm/s
Left ICA dist dias: -23 cm/s
Left ICA dist sys: -81 cm/s
Left ICA prox dias: -13 cm/s
Left ICA prox sys: -44 cm/s
RIGHT CCA MID DIAS: 14 cm/s
RIGHT ECA DIAS: 17 cm/s
RIGHT VERTEBRAL DIAS: 11 cm/s
Right CCA prox dias: 13 cm/s
Right CCA prox sys: 77 cm/s
Right cca dist sys: -74 cm/s

## 2016-09-29 NOTE — Progress Notes (Signed)
[facility]  Patient name: Faith Gaines MRN: [id number] DOB: 1945/12/18 Sex: female  REASON FOR VISIT: follow-up  HPI: Faith Gaines is a 71 y.o. female who presents for continued follow-up status post right carotid endarterectomy in 06/29/2013 for asymptomatic high-grade carotid artery stenosis. The patient denies any amaurosis fugax, sudden onset weakness and numbness of the extremities, expressive/receptive aphasia.  He does complain of some numbness to her left middle finger. She also has some peripheral diabetic neuropathy. Her diabetes is well controlled. She is upset today about gaining weight "after the holidays." She knows that she must change her diet and start exercising again. She has a prior history of breast cancer. She states her oncologist every 6 months. She is a former smoker.   Past Medical History:  Diagnosis Date  . Anxiety    no meds  . Breast cancer ([place])   . Cancer [facility])    breast Left 08/2013 surgery, melanoma  . Carotid artery occlusion   . Carotid bruit   . CKD (chronic kidney disease), stage III    no meds, blood draws q 27mos by Dr. Alyson Ingles in [place]  . Depression    no meds  . Diabetes mellitus without complication ([place])    type 2  . Diarrhea    sees Dr. Collene Mares  . GERD (gastroesophageal reflux disease)    occ  . Headache(784.0)   . Hyperlipidemia    diet controlled, no meds  . Hypertension   . Iron deficiency anemia   . Iron deficiency anemia due to chronic blood loss   . Osteopenia   . Peripheral vascular disease ([place])    rt carotid stenosis  . Pneumonia 12/2011, 05/2012   hx x2 in 2013  . PONV (postoperative nausea and vomiting)   . SVD (spontaneous vaginal delivery)    x 3  . Trigger finger    resolved with cortisone injections  . Urgency of urination    urgency and frequency; loss of control at times    Family History  Problem Relation Age of Onset  . Cancer Mother     breast, uterus, lung   . Heart attack Mother   . Stroke Mother   . Heart disease Mother     Open Heart Surgery  . Hypertension Mother   . Varicose Veins Mother   . Heart disease Father   . Heart attack Father   . Kidney disease Father   . Hypertension Father   . Diabetes Father   . Cancer Father   . Asthma Father   . Heart attack Sister   . Heart disease Sister     SOCIAL HISTORY: Social History  Substance Use Topics  . Smoking status: Former Smoker    Packs/day: 0.25    Years: 3.00    Types: Cigarettes    Quit date: 07/20/2010  . Smokeless tobacco: Never Used  . Alcohol use No    Allergies  Allergen Reactions  . Codeine Nausea And Vomiting and Other (See Comments)    Headache and vomiting  . Ace Inhibitors     Drop in GFR  . Clarithromycin     Unable to sleep   . Crestor [Rosuvastatin]     Body aches  . Lipitor [Atorvastatin]     Body aches  . Avandamet [Rosiglitazone-Metformin] Rash and Other (See Comments)    Makes her hyper    Current Outpatient Prescriptions  Medication Sig Dispense Refill  . aspirin EC  81 MG tablet Take 81 mg by mouth daily.    . Canagliflozin (INVOKANA) 300 MG TABS Take 300 mg by mouth daily.     . nebivolol (BYSTOLIC) 10 MG tablet Take 20 mg by mouth daily.     . Omega-3 Fatty Acids (OMEGA-3 2100) 1050 MG CAPS Take by mouth daily.    Marland Kitchen oxyCODONE-acetaminophen (PERCOCET/ROXICET) 5-325 MG per tablet Take 1-2 tablets by mouth every 4 (four) hours as needed for moderate pain. 30 tablet 0  . pantoprazole (PROTONIX) 40 MG tablet Take 40 mg by mouth daily.    . Saxagliptin-Metformin (KOMBIGLYZE XR) 2.11-998 MG TB24 Take 1 tablet by mouth 2 (two) times daily. Reported on 08/21/2015     No current facility-administered medications for this visit.     REVIEW OF SYSTEMS:  [X]  denotes positive finding, [ ]  denotes negative finding Cardiac  Comments:  Chest pain or chest pressure:    Shortness of breath upon exertion:    Short of breath when lying flat:      Irregular heart rhythm:        Vascular    Pain in calf, thigh, or hip brought on by ambulation:    Pain in feet at night that wakes you up from your sleep:     Blood clot in your veins:    Leg swelling:         Pulmonary    Oxygen at home:    Productive cough:     Wheezing:         Neurologic    Sudden weakness in arms or legs:     Sudden numbness in arms or legs:     Sudden onset of difficulty speaking or slurred speech:    Temporary loss of vision in one eye:     Problems with dizziness:         Gastrointestinal    Blood in stool:     Vomited blood:         Genitourinary    Burning when urinating:     Blood in urine:        Psychiatric    Major depression:         Hematologic    Bleeding problems:    Problems with blood clotting too easily:        Skin    Rashes or ulcers:        Constitutional    Fever or chills:      PHYSICAL EXAM: Vitals:   09/29/16 1453 09/29/16 1457  BP: (!) 167/81 (!) 157/65  Pulse: 66   Resp: 16   Temp: 98.2 F (36.8 C)   TempSrc: Oral   SpO2: 95%   Weight: 127 lb 14.4 oz (58 kg)   Height: 5\' 1"  (1.549 m)     GENERAL: The patient is a well-nourished female, in no acute distress. The vital signs are documented above. CARDIAC: There is a regular rate and rhythm. No carotid bruits.  VASCULAR: 2+ radial and dorsalis pedis pulses bilaterally.  PULMONARY: There is good air exchange bilaterally without wheezing or rales. ABDOMEN: Soft and non-tender with normal pitched bowel sounds.  MUSCULOSKELETAL: There are no major deformities or cyanosis.No lower extremity swelling. No varicosities seen lower legs.  NEUROLOGIC: No focal weakness or paresthesias are detected. SKIN: There are no ulcers or rashes noted. PSYCHIATRIC: The patient has a normal affect.  DATA:  Carotid duplex 09/29/16  Patent right carotid endarterectomy site without evidence of restenosis Less than 40%  left proximal internal carotid artery stenosis  Vertebral  arteries are patent with antegrade flow bilaterally  MEDICAL ISSUES: Carotid stenosis  The patient's right carotid endarterectomy site has no evidence of restenosis. There is less than 40% left internal carotid artery stenosis. She denies any TIA or stroke symptoms. Plan for repeat carotid duplex in 1 year.  Virgina Jock, PA-C [facility] MD: [place]

## 2016-09-30 NOTE — Addendum Note (Signed)
Addended by: Lianne Cure A on: 09/30/2016 09:21 AM   Modules accepted: Orders

## 2016-10-19 DIAGNOSIS — E1122 Type 2 diabetes mellitus with diabetic chronic kidney disease: Secondary | ICD-10-CM | POA: Diagnosis not present

## 2016-10-19 DIAGNOSIS — K219 Gastro-esophageal reflux disease without esophagitis: Secondary | ICD-10-CM | POA: Diagnosis not present

## 2016-10-19 DIAGNOSIS — N183 Chronic kidney disease, stage 3 (moderate): Secondary | ICD-10-CM | POA: Diagnosis not present

## 2016-10-19 DIAGNOSIS — Z7984 Long term (current) use of oral hypoglycemic drugs: Secondary | ICD-10-CM | POA: Diagnosis not present

## 2016-10-19 DIAGNOSIS — R51 Headache: Secondary | ICD-10-CM | POA: Diagnosis not present

## 2016-10-19 DIAGNOSIS — I739 Peripheral vascular disease, unspecified: Secondary | ICD-10-CM | POA: Diagnosis not present

## 2016-10-19 DIAGNOSIS — I129 Hypertensive chronic kidney disease with stage 1 through stage 4 chronic kidney disease, or unspecified chronic kidney disease: Secondary | ICD-10-CM | POA: Diagnosis not present

## 2016-10-20 ENCOUNTER — Other Ambulatory Visit: Payer: Self-pay | Admitting: Family Medicine

## 2016-10-20 DIAGNOSIS — R51 Headache: Principal | ICD-10-CM

## 2016-10-20 DIAGNOSIS — R519 Headache, unspecified: Secondary | ICD-10-CM

## 2016-10-23 ENCOUNTER — Ambulatory Visit: Payer: Medicare Other

## 2016-10-25 ENCOUNTER — Ambulatory Visit
Admission: RE | Admit: 2016-10-25 | Discharge: 2016-10-25 | Disposition: A | Discharge status: Home / Self Care | Payer: Medicare Other | Source: Ambulatory Visit | Attending: Family Medicine | Admitting: Family Medicine

## 2016-10-25 ENCOUNTER — Inpatient Hospital Stay
Admission: RE | Admit: 2016-10-25 | Discharge: 2016-10-25 | Disposition: A | Discharge status: Home / Self Care | Payer: Medicare Other | Source: Ambulatory Visit | Attending: Family Medicine | Admitting: Family Medicine

## 2016-10-25 DIAGNOSIS — R51 Headache: Secondary | ICD-10-CM | POA: Diagnosis not present

## 2016-10-25 DIAGNOSIS — R519 Headache, unspecified: Secondary | ICD-10-CM

## 2016-11-17 DIAGNOSIS — L661 Lichen planopilaris: Secondary | ICD-10-CM | POA: Diagnosis not present

## 2016-11-17 DIAGNOSIS — Z85820 Personal history of malignant melanoma of skin: Secondary | ICD-10-CM | POA: Diagnosis not present

## 2016-11-17 DIAGNOSIS — Z85828 Personal history of other malignant neoplasm of skin: Secondary | ICD-10-CM | POA: Diagnosis not present

## 2016-11-17 DIAGNOSIS — L089 Local infection of the skin and subcutaneous tissue, unspecified: Secondary | ICD-10-CM | POA: Diagnosis not present

## 2016-11-17 DIAGNOSIS — Z86018 Personal history of other benign neoplasm: Secondary | ICD-10-CM | POA: Diagnosis not present

## 2016-11-17 DIAGNOSIS — D1800 Hemangioma unspecified site: Secondary | ICD-10-CM | POA: Diagnosis not present

## 2016-11-17 DIAGNOSIS — L821 Other seborrheic keratosis: Secondary | ICD-10-CM | POA: Diagnosis not present

## 2016-11-17 DIAGNOSIS — D2361 Other benign neoplasm of skin of right upper limb, including shoulder: Secondary | ICD-10-CM | POA: Diagnosis not present

## 2016-11-17 DIAGNOSIS — D225 Melanocytic nevi of trunk: Secondary | ICD-10-CM | POA: Diagnosis not present

## 2016-11-17 DIAGNOSIS — L814 Other melanin hyperpigmentation: Secondary | ICD-10-CM | POA: Diagnosis not present

## 2016-11-17 DIAGNOSIS — D485 Neoplasm of uncertain behavior of skin: Secondary | ICD-10-CM | POA: Diagnosis not present

## 2016-12-17 DIAGNOSIS — D489 Neoplasm of uncertain behavior, unspecified: Secondary | ICD-10-CM | POA: Diagnosis not present

## 2016-12-17 DIAGNOSIS — L905 Scar conditions and fibrosis of skin: Secondary | ICD-10-CM | POA: Diagnosis not present

## 2016-12-17 DIAGNOSIS — D2261 Melanocytic nevi of right upper limb, including shoulder: Secondary | ICD-10-CM | POA: Diagnosis not present

## 2016-12-23 DIAGNOSIS — Z01419 Encounter for gynecological examination (general) (routine) without abnormal findings: Secondary | ICD-10-CM | POA: Diagnosis not present

## 2016-12-23 DIAGNOSIS — Z6824 Body mass index (BMI) 24.0-24.9, adult: Secondary | ICD-10-CM | POA: Diagnosis not present

## 2016-12-30 DIAGNOSIS — M810 Age-related osteoporosis without current pathological fracture: Secondary | ICD-10-CM | POA: Diagnosis not present

## 2016-12-30 DIAGNOSIS — Z90722 Acquired absence of ovaries, bilateral: Secondary | ICD-10-CM | POA: Diagnosis not present

## 2016-12-30 DIAGNOSIS — M8580 Other specified disorders of bone density and structure, unspecified site: Secondary | ICD-10-CM | POA: Diagnosis not present

## 2016-12-30 DIAGNOSIS — Z86000 Personal history of in-situ neoplasm of breast: Secondary | ICD-10-CM | POA: Diagnosis not present

## 2016-12-30 DIAGNOSIS — Z853 Personal history of malignant neoplasm of breast: Secondary | ICD-10-CM | POA: Diagnosis not present

## 2016-12-30 DIAGNOSIS — Z90710 Acquired absence of both cervix and uterus: Secondary | ICD-10-CM | POA: Diagnosis not present

## 2016-12-30 DIAGNOSIS — Z85820 Personal history of malignant melanoma of skin: Secondary | ICD-10-CM | POA: Diagnosis not present

## 2016-12-30 DIAGNOSIS — L659 Nonscarring hair loss, unspecified: Secondary | ICD-10-CM | POA: Diagnosis not present

## 2017-05-14 ENCOUNTER — Other Ambulatory Visit: Payer: Self-pay | Admitting: Oncology

## 2017-05-14 DIAGNOSIS — Z853 Personal history of malignant neoplasm of breast: Secondary | ICD-10-CM

## 2017-05-18 DIAGNOSIS — H11153 Pinguecula, bilateral: Secondary | ICD-10-CM | POA: Diagnosis not present

## 2017-05-18 DIAGNOSIS — H25813 Combined forms of age-related cataract, bilateral: Secondary | ICD-10-CM | POA: Diagnosis not present

## 2017-05-18 DIAGNOSIS — E119 Type 2 diabetes mellitus without complications: Secondary | ICD-10-CM | POA: Diagnosis not present

## 2017-05-19 DIAGNOSIS — I129 Hypertensive chronic kidney disease with stage 1 through stage 4 chronic kidney disease, or unspecified chronic kidney disease: Secondary | ICD-10-CM | POA: Diagnosis not present

## 2017-05-19 DIAGNOSIS — I739 Peripheral vascular disease, unspecified: Secondary | ICD-10-CM | POA: Diagnosis not present

## 2017-05-19 DIAGNOSIS — Z0000 Encounter for general adult medical examination without abnormal findings: Secondary | ICD-10-CM | POA: Diagnosis not present

## 2017-05-19 DIAGNOSIS — E1122 Type 2 diabetes mellitus with diabetic chronic kidney disease: Secondary | ICD-10-CM | POA: Diagnosis not present

## 2017-05-19 DIAGNOSIS — Z7984 Long term (current) use of oral hypoglycemic drugs: Secondary | ICD-10-CM | POA: Diagnosis not present

## 2017-05-19 DIAGNOSIS — N183 Chronic kidney disease, stage 3 (moderate): Secondary | ICD-10-CM | POA: Diagnosis not present

## 2017-05-19 DIAGNOSIS — Z1159 Encounter for screening for other viral diseases: Secondary | ICD-10-CM | POA: Diagnosis not present

## 2017-05-19 DIAGNOSIS — Z1389 Encounter for screening for other disorder: Secondary | ICD-10-CM | POA: Diagnosis not present

## 2017-05-19 DIAGNOSIS — Z6823 Body mass index (BMI) 23.0-23.9, adult: Secondary | ICD-10-CM | POA: Diagnosis not present

## 2017-05-19 DIAGNOSIS — K219 Gastro-esophageal reflux disease without esophagitis: Secondary | ICD-10-CM | POA: Diagnosis not present

## 2017-05-19 DIAGNOSIS — Z23 Encounter for immunization: Secondary | ICD-10-CM | POA: Diagnosis not present

## 2017-05-20 DIAGNOSIS — L661 Lichen planopilaris: Secondary | ICD-10-CM | POA: Diagnosis not present

## 2017-05-20 DIAGNOSIS — Z23 Encounter for immunization: Secondary | ICD-10-CM | POA: Diagnosis not present

## 2017-06-09 DIAGNOSIS — H1132 Conjunctival hemorrhage, left eye: Secondary | ICD-10-CM | POA: Diagnosis not present

## 2017-07-02 ENCOUNTER — Ambulatory Visit
Admission: RE | Admit: 2017-07-02 | Discharge: 2017-07-02 | Disposition: A | Discharge status: Home / Self Care | Payer: Medicare Other | Source: Ambulatory Visit | Attending: Oncology | Admitting: Oncology

## 2017-07-02 DIAGNOSIS — Z90710 Acquired absence of both cervix and uterus: Secondary | ICD-10-CM

## 2017-07-02 DIAGNOSIS — Z90722 Acquired absence of ovaries, bilateral: Secondary | ICD-10-CM

## 2017-07-02 DIAGNOSIS — L659 Nonscarring hair loss, unspecified: Secondary | ICD-10-CM

## 2017-07-02 DIAGNOSIS — M8580 Other specified disorders of bone density and structure, unspecified site: Secondary | ICD-10-CM

## 2017-07-02 DIAGNOSIS — Z85820 Personal history of malignant melanoma of skin: Secondary | ICD-10-CM

## 2017-07-02 DIAGNOSIS — Z86000 Personal history of in-situ neoplasm of breast: Secondary | ICD-10-CM

## 2017-07-02 DIAGNOSIS — Z862 Personal history of diseases of the blood and blood-forming organs and certain disorders involving the immune mechanism: Secondary | ICD-10-CM

## 2017-07-02 DIAGNOSIS — Z853 Personal history of malignant neoplasm of breast: Secondary | ICD-10-CM

## 2017-07-02 DIAGNOSIS — R922 Inconclusive mammogram: Secondary | ICD-10-CM | POA: Diagnosis not present

## 2017-08-15 ENCOUNTER — Other Ambulatory Visit: Payer: Self-pay | Admitting: Oncology

## 2017-08-15 DIAGNOSIS — D0512 Intraductal carcinoma in situ of left breast: Secondary | ICD-10-CM

## 2017-10-05 ENCOUNTER — Ambulatory Visit (HOSPITAL_COMMUNITY)
Admission: RE | Admit: 2017-10-05 | Discharge: 2017-10-05 | Disposition: A | Discharge status: Home / Self Care | Payer: Medicare Other | Source: Ambulatory Visit | Attending: Family | Admitting: Family

## 2017-10-05 ENCOUNTER — Ambulatory Visit (INDEPENDENT_AMBULATORY_CARE_PROVIDER_SITE_OTHER): Payer: Medicare Other | Admitting: Family

## 2017-10-05 ENCOUNTER — Encounter: Payer: Self-pay | Admitting: Family

## 2017-10-05 ENCOUNTER — Other Ambulatory Visit: Payer: Self-pay

## 2017-10-05 VITALS — BP 143/78 | HR 66 | Temp 98.8°F | Resp 16 | Ht 61.0 in | Wt 126.0 lb

## 2017-10-05 DIAGNOSIS — Z9889 Other specified postprocedural states: Secondary | ICD-10-CM

## 2017-10-05 DIAGNOSIS — I6523 Occlusion and stenosis of bilateral carotid arteries: Secondary | ICD-10-CM

## 2017-10-05 NOTE — Progress Notes (Signed)
Chief Complaint: Follow up Extracranial Carotid Artery Stenosis   History of Present Illness  Faith Gaines is a 72 y.o. female who is status post right carotid endarterectomy on 06/29/13 by Dr. Kellie Simmering for severe asymptomatic stenosis.  She denies any lateralizing weakness, aphasia, amaurosis fugax, diplopia, blurred vision, or syncope.   She had eft breast surgery in 2015 and has progressed nicely and did not require radiation therapy. This was done by Dr. Barry Dienes.   She had a bladder tacking, hysterectomy, and rectocele repair in April 2016. She has anemia, had 2 infusions of iron in 2016, and felt much improved. She had a GI endoscopic work up for GI bleeding, no bleeding source found per pt.   She has intermittent posterior neck and forehead headaches for a few years, is not worsening or improving; she had frontal headaches before the right CEA that resolved.  She reports a month hx of intermittent right side neck burning sensation and pain from the posterior right ear to her right shoulder.   She denies claudication symptoms with walking, denies non healing wounds.   Pt Diabetic: Yes, states her last last A1C was 6.3  Pt smoker: former smoker, quit in 2012, smoked lightly for only 3 years after her husband died  Pt meds include: Statin : No, states her cholesterol is good ASA: Yes Other anticoagulants/antiplatelets: no   Past Medical History:  Diagnosis Date  . Anxiety    no meds  . Breast cancer ([place]) 2015   left breast   . Cancer ([place])    breast Left 08/2013 surgery, melanoma  . Carotid artery occlusion   . Carotid bruit   . CKD (chronic kidney disease), stage III (HCC)    no meds, blood draws q 70mos by Dr. Alyson Ingles in [place]  . Depression    no meds  . Diabetes mellitus without complication ([place])    type 2  . Diarrhea    sees Dr. Collene Mares  . GERD (gastroesophageal reflux disease)    occ  . Headache(784.0)   . Hyperlipidemia    diet controlled, no  meds  . Hypertension   . Iron deficiency anemia   . Iron deficiency anemia due to chronic blood loss   . Osteopenia   . Peripheral vascular disease ([place])    rt carotid stenosis  . Pneumonia 12/2011, 05/2012   hx x2 in 2013  . PONV (postoperative nausea and vomiting)   . SVD (spontaneous vaginal delivery)    x 3  . Trigger finger    resolved with cortisone injections  . Urgency of urination    urgency and frequency; loss of control at times    Social History Social History   Tobacco Use  . Smoking status: Former Smoker    Packs/day: 0.25    Years: 3.00    Pack years: 0.75    Types: Cigarettes    Last attempt to quit: 07/20/2010    Years since quitting: 7.2  . Smokeless tobacco: Never Used  Substance Use Topics  . Alcohol use: No    Alcohol/week: 0.0 oz  . Drug use: No    Family History Family History  Problem Relation Age of Onset  . Cancer Mother        breast, uterus, lung  . Heart attack Mother   . Stroke Mother   . Heart disease Mother        Open Heart Surgery  . Hypertension Mother   . Varicose Veins Mother   .  Heart disease Father   . Heart attack Father   . Kidney disease Father   . Hypertension Father   . Diabetes Father   . Cancer Father   . Asthma Father   . Heart attack Sister   . Heart disease Sister     Surgical History Past Surgical History:  Procedure Laterality Date  . ANTERIOR AND POSTERIOR REPAIR N/A 10/22/2014   Procedure: CYSTO, REPAIR CYSTOCELE/RECTOCELE;  Surgeon: Bjorn Loser, MD;  Location: [facility];  Service: Urology;  Laterality: N/A;  . APPENDECTOMY    . BLADDER SURGERY  05/1981   bladder tack  . BREAST LUMPECTOMY Left 2015  . CHOLECYSTECTOMY  1995  . COLONOSCOPY    . ENDARTERECTOMY Right 06/29/2013   Procedure: ENDARTERECTOMY CAROTID-RIGHT, WITH DACRON PATCH ANGIOPLASTY;  Surgeon: Mal Misty, MD;  Location: [facility];  Service: Vascular;  Laterality: Right;  . LAPAROSCOPIC ASSISTED VAGINAL HYSTERECTOMY N/A 10/22/2014    Procedure: LAPAROSCOPIC ASSISTED VAGINAL HYSTERECTOMY;  Surgeon: Marylynn Pearson, MD;  Location: [facility];  Service: Gynecology;  Laterality: N/A;  Pam from Dr. Mikle Bosworth office will call and add additional surgery for this patient in our block time.  Marland Kitchen MELANOMA EXCISION  12/1980   left leg,  . PARTIAL MASTECTOMY WITH NEEDLE LOCALIZATION Left 08/22/2013   Procedure: PARTIAL MASTECTOMY WITH NEEDLE LOCALIZATION;  Surgeon: Stark Klein, MD;  Location: [facility];  Service: General;  Laterality: Left;  . SALPINGOOPHORECTOMY Bilateral 10/22/2014   Procedure: SALPINGO OOPHORECTOMY;  Surgeon: Marylynn Pearson, MD;  Location: [facility];  Service: Gynecology;  Laterality: Bilateral;  . 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 TUBAL LIGATION    . VAGINAL PROLAPSE REPAIR N/A 10/22/2014   Procedure: VAULT PROLAPSE WITH GRAFT;  Surgeon: Bjorn Loser, MD;  Location: [facility];  Service: Urology;  Laterality: N/A;  . WISDOM TOOTH EXTRACTION      Allergies  Allergen Reactions  . Codeine Nausea And Vomiting and Other (See Comments)    Headache and vomiting  . Ace Inhibitors     Drop in GFR  . Clarithromycin     Unable to sleep   . Crestor [Rosuvastatin]     Body aches  . Lipitor [Atorvastatin]     Body aches  . Avandamet [Rosiglitazone-Metformin] Rash and Other (See Comments)    Makes her hyper    Current Outpatient Medications  Medication Sig Dispense Refill  . aspirin EC 81 MG tablet Take 81 mg by mouth daily.    . Canagliflozin (INVOKANA) 300 MG TABS Take 300 mg by mouth daily.     . nebivolol (BYSTOLIC) 10 MG tablet Take 20 mg by mouth daily.     . Omega-3 Fatty Acids (OMEGA-3 2100) 1050 MG CAPS Take by mouth daily.    . pantoprazole (PROTONIX) 40 MG tablet Take 40 mg by mouth daily.    . Saxagliptin-Metformin (KOMBIGLYZE XR) 2.11-998 MG TB24 Take 1 tablet by mouth 2 (two) times daily. Reported on 08/21/2015     No current facility-administered medications for this visit.     Review of  Systems : See HPI for pertinent positives and negatives.  Physical Examination  Vitals:   10/05/17 1108 10/05/17 1113  BP: (!) 143/74 (!) 143/78  Pulse: 66   Resp: 16   Temp: 98.8 F (37.1 C)   TempSrc: Oral   SpO2: 98%   Weight: 126 lb (57.2 kg)   Height: 5\' 1"  (1.549 m)    Body mass index is 23.81 kg/m.  General: WDWN female in NAD GAIT: normal Eyes: PERRLA Pulmonary: Non-labored, CTAB, no rales, rhonchi, or wheezing.  Cardiac: regular rhythm, no detected murmur.  VASCULAR EXAM Carotid Bruits Right Left   Negative Negative   Aorta is not palpable. Radial pulses are 2+ palpable and equal.      LE Pulses Right Left   POPLITEAL not palpable  not palpable       PT Not palpable Not palpable       DP palpable palpable    Gastrointestinal: soft, nontender, BS WNL, no r/g,no palpable masses.   Musculoskeletal: No muscle atrophy/wasting. M/S 4/5 throughout, extremities without ischemic changes.  Skin: No rashes, no ulcers, no cellulitis.   Neurologic:  A&O X 3; appropriate affect, sensation is normal; speech is normal, CN 2-12 intact, pain and light touch intact in extremities, motor exam as listed above. Psychiatric: Normal thought content, mood appropriate to clinical situation.       Assessment: Faith Gaines is a 72 y.o. female who is status post right carotid endarterectomy on 06/29/13.  She has no history of stroke or TIA.  She indicates that she has deconditioning, no barriers to walking.   I advised her to discuss with her PCP the intermittent burning sensation she is having in the last month  from the posterior aspect of her right ear to her right shoulder. She denies tingling in either upper extremity. Pain is elicited in the above areas when I push her right arm down that is held at 90 degree angle  to her body; no pain elicited on left side with this maneuver on the left arm.    DATA Carotid Duplex (10/05/17): Right ICA: CEA site, 1-39% stenosis Left ICA: 1-39% stenosis Bilateral vertebral artery flow is antegrade.  Bilateral subclavian artery waveforms are normal.  Slight stenosis in the right ICA compared to the exam on 09-29-16 in which there was no stenosis noted in the right ICA.   Plan:  Advised at least 30 minutes walking daily.    Follow-up in 1 year with Carotid Duplex scan.    I discussed in depth with the patient the nature of atherosclerosis, and emphasized the importance of maximal medical management including strict control of blood pressure, blood glucose, and lipid levels, obtaining regular exercise, and continued cessation of smoking.  The patient is aware that without maximal medical management the underlying atherosclerotic disease process will progress, limiting the benefit of any interventions. The patient was given information about stroke prevention and what symptoms should prompt the patient to seek immediate medical care. Thank you for allowing [place] to participate in this patient's care.  Clemon Chambers, RN, MSN, FNP-C [facility] Office: [phone]  Clinic Physician: Early  10/05/17 11:20 AM

## 2017-10-05 NOTE — Patient Instructions (Signed)

## 2017-10-22 ENCOUNTER — Ambulatory Visit (INDEPENDENT_AMBULATORY_CARE_PROVIDER_SITE_OTHER): Payer: Medicare Other | Admitting: Sports Medicine

## 2017-10-22 ENCOUNTER — Encounter: Payer: Self-pay | Admitting: Sports Medicine

## 2017-10-22 DIAGNOSIS — M79674 Pain in right toe(s): Secondary | ICD-10-CM

## 2017-10-22 DIAGNOSIS — I6523 Occlusion and stenosis of bilateral carotid arteries: Secondary | ICD-10-CM | POA: Diagnosis not present

## 2017-10-22 DIAGNOSIS — L603 Nail dystrophy: Secondary | ICD-10-CM | POA: Diagnosis not present

## 2017-10-22 DIAGNOSIS — M79675 Pain in left toe(s): Secondary | ICD-10-CM | POA: Diagnosis not present

## 2017-10-22 DIAGNOSIS — E119 Type 2 diabetes mellitus without complications: Secondary | ICD-10-CM | POA: Diagnosis not present

## 2017-10-22 NOTE — Patient Instructions (Addendum)

## 2017-10-22 NOTE — Progress Notes (Signed)
Subjective: Faith Gaines is a 72 y.o. female patient with history of diabetes who presents to office today complaining of long,mildly painful nails  while ambulating in shoes; unable to trim. Patient states that the glucose reading this morning was not recorded but ranges 150-53, last ac1 was 6.3. Patient denies any new changes in medication or new problems. Patient admits to a little tingling occasionally but denies any pain.  Patient states that she has been diagnosed with diabetes for 18 years.  Patient denies any other issues or problems at this time.  Review of Systems  All other systems reviewed and are negative.  Patient Active Problem List   Diagnosis Date Noted  . Prolapse of uterus 10/22/2014  . Iron deficiency anemia due to chronic blood loss   . Carotid stenosis 01/23/2014  . History of melanoma excision 01/16/2014  . DM (diabetes mellitus), type 2 with peripheral vascular complications ([place]) 53/97/6734  . Dyspnea 01/16/2014  . DM (diabetes mellitus), type 2 with renal complications ([place])   . Hyperlipidemia   . Hypertension   . Carotid bruit   . Osteopenia   . Peripheral vascular disease ([place])   . GERD (gastroesophageal reflux disease)   . Diarrhea   . Headache(784.0) 08/01/2013  . Carotid artery disease ([place]) 06/29/2013  . Occlusion and stenosis of carotid artery without mention of cerebral infarction 06/27/2013  . Breast cancer of upper-outer quadrant of left female breast, Tis, 3 oclock 06/23/2013   Current Outpatient Medications on File Prior to Visit  Medication Sig Dispense Refill  . aspirin EC 81 MG tablet Take 81 mg by mouth daily.    . Canagliflozin (INVOKANA) 300 MG TABS Take 300 mg by mouth daily.     . nebivolol (BYSTOLIC) 10 MG tablet Take 20 mg by mouth daily.     . Omega-3 Fatty Acids (OMEGA-3 2100) 1050 MG CAPS Take by mouth daily.    . pantoprazole (PROTONIX) 40 MG tablet Take 40 mg by mouth daily.    . Saxagliptin-Metformin (KOMBIGLYZE XR)  2.11-998 MG TB24 Take 1 tablet by mouth 2 (two) times daily. Reported on 08/21/2015     No current facility-administered medications on file prior to visit.    Allergies  Allergen Reactions  . Codeine Nausea And Vomiting and Other (See Comments)    Headache and vomiting  . Ace Inhibitors     Drop in GFR  . Clarithromycin     Unable to sleep   . Crestor [Rosuvastatin]     Body aches  . Lipitor [Atorvastatin]     Body aches  . Avandamet [Rosiglitazone-Metformin] Rash and Other (See Comments)    Makes her hyper    No results found for this or any previous visit (from the past 2160 hour(s)).  Objective: General: Patient is awake, alert, and oriented x 3 and in no acute distress.  Integument: Skin is warm, dry and supple bilateral. Nails are tender, long, thickened and  dystrophic, 1-5 bilateral. No signs of infection. No open lesions or preulcerative lesions present bilateral. Remaining integument unremarkable.  Vasculature:  Dorsalis Pedis pulse 2/4 bilateral. Posterior Tibial pulse  1/4 bilateral.  Capillary fill time <3 sec 1-5 bilateral. Positive hair growth to the level of the digits. Temperature gradient within normal limits.  Mild varicosities present bilateral. No edema present bilateral.   Neurology: The patient has intact sensation measured with a 5.07/10g Semmes Weinstein Monofilament at all pedal sites bilateral. Vibratory sensation intact bilateral with tuning fork. No Babinski sign present  bilateral.   Musculoskeletal: No symptomatic pedal deformities noted bilateral. Muscular strength 5/5 in all lower extremity muscular groups bilateral without pain on range of motion . No tenderness with calf compression bilateral.  Assessment and Plan: Problem List Items Addressed This Visit    None    Visit Diagnoses    Nail dystrophy    -  Primary   Pain in toes of both feet       Diabetes mellitus without complication ([place])          -Examined patient. -Discussed and  educated patient on diabetic foot care, especially with  regards to the vascular, neurological and musculoskeletal systems.  -Stressed the importance of good glycemic control and the detriment of not  controlling glucose levels in relation to the foot. -Mechanically debrided all nails 1-5 bilateral using sterile nail nipper and filed with dremel without incident  -Answered all patient questions -Patient to return  in 3 months for at risk foot care -Patient advised to call the office if any problems or questions arise in the meantime.  Landis Martins, DPM

## 2017-11-22 DIAGNOSIS — Z85820 Personal history of malignant melanoma of skin: Secondary | ICD-10-CM | POA: Diagnosis not present

## 2017-11-22 DIAGNOSIS — L821 Other seborrheic keratosis: Secondary | ICD-10-CM | POA: Diagnosis not present

## 2017-11-22 DIAGNOSIS — L918 Other hypertrophic disorders of the skin: Secondary | ICD-10-CM | POA: Diagnosis not present

## 2017-11-22 DIAGNOSIS — L661 Lichen planopilaris: Secondary | ICD-10-CM | POA: Diagnosis not present

## 2017-11-22 DIAGNOSIS — Z86018 Personal history of other benign neoplasm: Secondary | ICD-10-CM | POA: Diagnosis not present

## 2017-11-22 DIAGNOSIS — D225 Melanocytic nevi of trunk: Secondary | ICD-10-CM | POA: Diagnosis not present

## 2017-11-22 DIAGNOSIS — D1800 Hemangioma unspecified site: Secondary | ICD-10-CM | POA: Diagnosis not present

## 2017-11-22 DIAGNOSIS — L814 Other melanin hyperpigmentation: Secondary | ICD-10-CM | POA: Diagnosis not present

## 2017-11-22 DIAGNOSIS — Z85828 Personal history of other malignant neoplasm of skin: Secondary | ICD-10-CM | POA: Diagnosis not present

## 2017-12-01 ENCOUNTER — Other Ambulatory Visit: Payer: Self-pay | Admitting: Family Medicine

## 2017-12-01 ENCOUNTER — Ambulatory Visit
Admission: RE | Admit: 2017-12-01 | Discharge: 2017-12-01 | Disposition: A | Discharge status: Home / Self Care | Payer: Medicare Other | Source: Ambulatory Visit | Attending: Family Medicine | Admitting: Family Medicine

## 2017-12-01 DIAGNOSIS — R079 Chest pain, unspecified: Secondary | ICD-10-CM | POA: Diagnosis not present

## 2017-12-01 DIAGNOSIS — K219 Gastro-esophageal reflux disease without esophagitis: Secondary | ICD-10-CM | POA: Diagnosis not present

## 2017-12-01 DIAGNOSIS — M549 Dorsalgia, unspecified: Secondary | ICD-10-CM

## 2017-12-01 DIAGNOSIS — N183 Chronic kidney disease, stage 3 (moderate): Secondary | ICD-10-CM | POA: Diagnosis not present

## 2017-12-01 DIAGNOSIS — E1122 Type 2 diabetes mellitus with diabetic chronic kidney disease: Secondary | ICD-10-CM | POA: Diagnosis not present

## 2017-12-01 DIAGNOSIS — Z6823 Body mass index (BMI) 23.0-23.9, adult: Secondary | ICD-10-CM | POA: Diagnosis not present

## 2017-12-01 DIAGNOSIS — E782 Mixed hyperlipidemia: Secondary | ICD-10-CM | POA: Diagnosis not present

## 2017-12-01 DIAGNOSIS — I739 Peripheral vascular disease, unspecified: Secondary | ICD-10-CM | POA: Diagnosis not present

## 2017-12-01 DIAGNOSIS — Z7984 Long term (current) use of oral hypoglycemic drugs: Secondary | ICD-10-CM | POA: Diagnosis not present

## 2017-12-01 DIAGNOSIS — I129 Hypertensive chronic kidney disease with stage 1 through stage 4 chronic kidney disease, or unspecified chronic kidney disease: Secondary | ICD-10-CM | POA: Diagnosis not present

## 2018-01-05 DIAGNOSIS — Z6823 Body mass index (BMI) 23.0-23.9, adult: Secondary | ICD-10-CM | POA: Diagnosis not present

## 2018-01-05 DIAGNOSIS — N958 Other specified menopausal and perimenopausal disorders: Secondary | ICD-10-CM | POA: Diagnosis not present

## 2018-01-05 DIAGNOSIS — M8588 Other specified disorders of bone density and structure, other site: Secondary | ICD-10-CM | POA: Diagnosis not present

## 2018-01-05 DIAGNOSIS — Z01419 Encounter for gynecological examination (general) (routine) without abnormal findings: Secondary | ICD-10-CM | POA: Diagnosis not present

## 2018-01-13 ENCOUNTER — Ambulatory Visit: Payer: Medicare Other | Admitting: Sports Medicine

## 2018-01-26 DIAGNOSIS — M859 Disorder of bone density and structure, unspecified: Secondary | ICD-10-CM | POA: Diagnosis not present

## 2018-05-03 DIAGNOSIS — E559 Vitamin D deficiency, unspecified: Secondary | ICD-10-CM | POA: Diagnosis not present

## 2018-06-02 DIAGNOSIS — D485 Neoplasm of uncertain behavior of skin: Secondary | ICD-10-CM | POA: Diagnosis not present

## 2018-06-02 DIAGNOSIS — Z23 Encounter for immunization: Secondary | ICD-10-CM | POA: Diagnosis not present

## 2018-06-02 DIAGNOSIS — L858 Other specified epidermal thickening: Secondary | ICD-10-CM | POA: Diagnosis not present

## 2018-06-02 DIAGNOSIS — L439 Lichen planus, unspecified: Secondary | ICD-10-CM | POA: Diagnosis not present

## 2018-06-02 DIAGNOSIS — L986 Other infiltrative disorders of the skin and subcutaneous tissue: Secondary | ICD-10-CM | POA: Diagnosis not present

## 2018-06-24 DIAGNOSIS — E119 Type 2 diabetes mellitus without complications: Secondary | ICD-10-CM | POA: Diagnosis not present

## 2018-06-24 DIAGNOSIS — H25813 Combined forms of age-related cataract, bilateral: Secondary | ICD-10-CM | POA: Diagnosis not present

## 2018-06-29 DIAGNOSIS — I129 Hypertensive chronic kidney disease with stage 1 through stage 4 chronic kidney disease, or unspecified chronic kidney disease: Secondary | ICD-10-CM | POA: Diagnosis not present

## 2018-06-29 DIAGNOSIS — E782 Mixed hyperlipidemia: Secondary | ICD-10-CM | POA: Diagnosis not present

## 2018-06-29 DIAGNOSIS — Z1389 Encounter for screening for other disorder: Secondary | ICD-10-CM | POA: Diagnosis not present

## 2018-06-29 DIAGNOSIS — N183 Chronic kidney disease, stage 3 (moderate): Secondary | ICD-10-CM | POA: Diagnosis not present

## 2018-06-29 DIAGNOSIS — I739 Peripheral vascular disease, unspecified: Secondary | ICD-10-CM | POA: Diagnosis not present

## 2018-06-29 DIAGNOSIS — K219 Gastro-esophageal reflux disease without esophagitis: Secondary | ICD-10-CM | POA: Diagnosis not present

## 2018-06-29 DIAGNOSIS — E1122 Type 2 diabetes mellitus with diabetic chronic kidney disease: Secondary | ICD-10-CM | POA: Diagnosis not present

## 2018-06-29 DIAGNOSIS — Z23 Encounter for immunization: Secondary | ICD-10-CM | POA: Diagnosis not present

## 2018-06-29 DIAGNOSIS — Z0000 Encounter for general adult medical examination without abnormal findings: Secondary | ICD-10-CM | POA: Diagnosis not present

## 2018-06-29 DIAGNOSIS — Z6823 Body mass index (BMI) 23.0-23.9, adult: Secondary | ICD-10-CM | POA: Diagnosis not present

## 2018-07-04 ENCOUNTER — Ambulatory Visit
Admission: RE | Admit: 2018-07-04 | Discharge: 2018-07-04 | Disposition: A | Discharge status: Home / Self Care | Payer: Medicare Other | Source: Ambulatory Visit | Attending: Oncology | Admitting: Oncology

## 2018-07-04 DIAGNOSIS — R922 Inconclusive mammogram: Secondary | ICD-10-CM | POA: Diagnosis not present

## 2018-07-04 DIAGNOSIS — D0512 Intraductal carcinoma in situ of left breast: Secondary | ICD-10-CM

## 2018-07-07 DIAGNOSIS — Z85820 Personal history of malignant melanoma of skin: Secondary | ICD-10-CM | POA: Diagnosis not present

## 2018-07-07 DIAGNOSIS — R739 Hyperglycemia, unspecified: Secondary | ICD-10-CM | POA: Diagnosis not present

## 2018-07-07 DIAGNOSIS — D509 Iron deficiency anemia, unspecified: Secondary | ICD-10-CM | POA: Diagnosis not present

## 2018-07-07 DIAGNOSIS — L659 Nonscarring hair loss, unspecified: Secondary | ICD-10-CM | POA: Diagnosis not present

## 2018-07-07 DIAGNOSIS — M8589 Other specified disorders of bone density and structure, multiple sites: Secondary | ICD-10-CM | POA: Diagnosis not present

## 2018-07-07 DIAGNOSIS — M8580 Other specified disorders of bone density and structure, unspecified site: Secondary | ICD-10-CM

## 2018-07-07 DIAGNOSIS — Z86000 Personal history of in-situ neoplasm of breast: Secondary | ICD-10-CM | POA: Diagnosis not present

## 2018-07-07 DIAGNOSIS — Z90710 Acquired absence of both cervix and uterus: Secondary | ICD-10-CM

## 2018-07-07 DIAGNOSIS — Z853 Personal history of malignant neoplasm of breast: Secondary | ICD-10-CM | POA: Diagnosis not present

## 2018-07-07 DIAGNOSIS — Z90722 Acquired absence of ovaries, bilateral: Secondary | ICD-10-CM

## 2018-08-29 DIAGNOSIS — E7800 Pure hypercholesterolemia, unspecified: Secondary | ICD-10-CM | POA: Diagnosis not present

## 2018-08-29 DIAGNOSIS — I129 Hypertensive chronic kidney disease with stage 1 through stage 4 chronic kidney disease, or unspecified chronic kidney disease: Secondary | ICD-10-CM | POA: Diagnosis not present

## 2018-09-05 DIAGNOSIS — J069 Acute upper respiratory infection, unspecified: Secondary | ICD-10-CM | POA: Diagnosis not present

## 2018-10-25 ENCOUNTER — Ambulatory Visit: Payer: Medicare Other | Admitting: Family

## 2018-10-25 ENCOUNTER — Encounter (HOSPITAL_COMMUNITY): Payer: Medicare Other

## 2019-01-05 ENCOUNTER — Other Ambulatory Visit: Payer: Self-pay

## 2019-01-05 DIAGNOSIS — I6523 Occlusion and stenosis of bilateral carotid arteries: Secondary | ICD-10-CM

## 2019-01-11 ENCOUNTER — Encounter: Payer: Self-pay | Admitting: Family

## 2019-01-11 ENCOUNTER — Other Ambulatory Visit: Payer: Self-pay

## 2019-01-11 ENCOUNTER — Ambulatory Visit (INDEPENDENT_AMBULATORY_CARE_PROVIDER_SITE_OTHER): Payer: Medicare Other | Admitting: Family

## 2019-01-11 ENCOUNTER — Ambulatory Visit (HOSPITAL_COMMUNITY)
Admission: RE | Admit: 2019-01-11 | Discharge: 2019-01-11 | Disposition: A | Discharge status: Home / Self Care | Payer: Medicare Other | Source: Ambulatory Visit | Attending: Family | Admitting: Family

## 2019-01-11 VITALS — BP 128/71 | HR 78 | Temp 97.8°F | Resp 12 | Ht 61.0 in | Wt 116.4 lb

## 2019-01-11 DIAGNOSIS — Z9889 Other specified postprocedural states: Secondary | ICD-10-CM

## 2019-01-11 DIAGNOSIS — I6523 Occlusion and stenosis of bilateral carotid arteries: Secondary | ICD-10-CM

## 2019-01-11 NOTE — Patient Instructions (Signed)

## 2019-01-11 NOTE — Progress Notes (Signed)
Chief Complaint: Follow up Extracranial Carotid Artery Stenosis   History of Present Illness  Faith Gaines is a 73 y.o. female who is status post right carotid endarterectomy on 06/29/13 by Dr. Kellie Simmering for severe asymptomatic stenosis.  She denies any lateralizing weakness, aphasia, amaurosis fugax, diplopia, blurred vision, or syncope.   She left breast surgery in 2015 and has progressed nicely and did not require radiation therapy. This was done by Dr. Barry Dienes.   She had a bladder tacking, hysterectomy, and rectocele repair in April 2016. She has anemia, had 2 infusions of iron in 2016, and felt much improved. She had a GI endoscopic work up for GI bleeding, no bleeding source found per pt..   She has intermittent posterior neck and forehead headaches for a few years, is not worsening or improving; she had frontal headaches before the right CEA that resolved.  She denies claudication type symptoms with walking, denies non healing wounds.    Diabetic: yes, states her last A1C was 7.? In November 2019, since then she states that she has brought her blood sugar down  Tobacco use: former smoker, quit in 2012, smoked lightly for only 3 years after her husband died  Pt meds include: Statin : no, she states she takes a 2 mg tablet once/week for her cholesterol, not on her list ASA: yes Other anticoagulants/antiplatelets: no   Past Medical History:  Diagnosis Date  . Anxiety    no meds  . Breast cancer ([place]) 2015   left breast   . Cancer ([place])    breast Left 08/2013 surgery, melanoma  . Carotid artery occlusion   . Carotid bruit   . CKD (chronic kidney disease), stage III (HCC)    no meds, blood draws q 85mos by Dr. Alyson Ingles in [place]  . Depression    no meds  . Diabetes mellitus without complication ([place])    type 2  . Diarrhea    sees Dr. Collene Mares  . GERD (gastroesophageal reflux disease)    occ  . Headache(784.0)   . Hyperlipidemia    diet controlled, no  meds  . Hypertension   . Iron deficiency anemia   . Iron deficiency anemia due to chronic blood loss   . Osteopenia   . Peripheral vascular disease ([place])    rt carotid stenosis  . Pneumonia 12/2011, 05/2012   hx x2 in 2013  . PONV (postoperative nausea and vomiting)   . SVD (spontaneous vaginal delivery)    x 3  . Trigger finger    resolved with cortisone injections  . Urgency of urination    urgency and frequency; loss of control at times    Social History Social History   Tobacco Use  . Smoking status: Former Smoker    Packs/day: 0.25    Years: 3.00    Pack years: 0.75    Types: Cigarettes    Quit date: 07/20/2010    Years since quitting: 8.4  . Smokeless tobacco: Never Used  Substance Use Topics  . Alcohol use: No    Alcohol/week: 0.0 standard drinks  . Drug use: No    Family History Family History  Problem Relation Age of Onset  . Cancer Mother        breast, uterus, lung  . Heart attack Mother   . Stroke Mother   . Heart disease Mother        Open Heart Surgery  . Hypertension Mother   . Varicose Veins Mother   .  Heart disease Father   . Heart attack Father   . Kidney disease Father   . Hypertension Father   . Diabetes Father   . Cancer Father   . Asthma Father   . Heart attack Sister   . Heart disease Sister     Surgical History Past Surgical History:  Procedure Laterality Date  . ANTERIOR AND POSTERIOR REPAIR N/A 10/22/2014   Procedure: CYSTO, REPAIR CYSTOCELE/RECTOCELE;  Surgeon: Bjorn Loser, MD;  Location: [facility];  Service: Urology;  Laterality: N/A;  . APPENDECTOMY    . BLADDER SURGERY  05/1981   bladder tack  . BREAST LUMPECTOMY Left 2015  . CHOLECYSTECTOMY  1995  . COLONOSCOPY    . ENDARTERECTOMY Right 06/29/2013   Procedure: ENDARTERECTOMY CAROTID-RIGHT, WITH DACRON PATCH ANGIOPLASTY;  Surgeon: Mal Misty, MD;  Location: [facility];  Service: Vascular;  Laterality: Right;  . LAPAROSCOPIC ASSISTED VAGINAL HYSTERECTOMY N/A 10/22/2014    Procedure: LAPAROSCOPIC ASSISTED VAGINAL HYSTERECTOMY;  Surgeon: Marylynn Pearson, MD;  Location: [facility];  Service: Gynecology;  Laterality: N/A;  Pam from Dr. Mikle Bosworth office will call and add additional surgery for this patient in our block time.  Marland Kitchen MELANOMA EXCISION  12/1980   left leg,  . PARTIAL MASTECTOMY WITH NEEDLE LOCALIZATION Left 08/22/2013   Procedure: PARTIAL MASTECTOMY WITH NEEDLE LOCALIZATION;  Surgeon: Stark Klein, MD;  Location: [facility];  Service: General;  Laterality: Left;  . SALPINGOOPHORECTOMY Bilateral 10/22/2014   Procedure: SALPINGO OOPHORECTOMY;  Surgeon: Marylynn Pearson, MD;  Location: [facility];  Service: Gynecology;  Laterality: Bilateral;  . 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 TUBAL LIGATION    . VAGINAL PROLAPSE REPAIR N/A 10/22/2014   Procedure: VAULT PROLAPSE WITH GRAFT;  Surgeon: Bjorn Loser, MD;  Location: [facility];  Service: Urology;  Laterality: N/A;  . WISDOM TOOTH EXTRACTION      Allergies  Allergen Reactions  . Codeine Nausea And Vomiting and Other (See Comments)    Headache and vomiting  . Ace Inhibitors     Drop in GFR  . Clarithromycin     Unable to sleep   . Crestor [Rosuvastatin]     Body aches  . Lipitor [Atorvastatin]     Body aches  . Avandamet [Rosiglitazone-Metformin] Rash and Other (See Comments)    Makes her hyper    Current Outpatient Medications  Medication Sig Dispense Refill  . aspirin EC 81 MG tablet Take 81 mg by mouth daily.    . Canagliflozin (INVOKANA) 300 MG TABS Take 300 mg by mouth daily.     . nebivolol (BYSTOLIC) 10 MG tablet Take 20 mg by mouth daily.     . Omega-3 Fatty Acids (OMEGA-3 2100) 1050 MG CAPS Take by mouth daily.    . pantoprazole (PROTONIX) 40 MG tablet Take 40 mg by mouth daily.    . Saxagliptin-Metformin (KOMBIGLYZE XR) 2.11-998 MG TB24 Take 1 tablet by mouth 2 (two) times daily. Reported on 08/21/2015     No current facility-administered medications for this visit.     Review of  Systems : See HPI for pertinent positives and negatives.  Physical Examination  Vitals:   01/11/19 1150 01/11/19 1152  BP: 114/73 128/71  Pulse: 78   Resp: 12   Temp: 97.8 F (36.6 C)   TempSrc: Temporal   SpO2: 100%   Weight: 116 lb 6.4 oz (52.8 kg)   Height: 5\' 1"  (1.549 m)    Body mass index is 21.99 kg/m.  General: WDWN petite female in NAD GAIT: normal Eyes: PERRLA HENT: No gross abnormalities.  Pulmonary:  Respirations are non-labored, good air movement in all fields, CTAB, no rales, rhonchi, or wheezing. Cardiac: regular rhythm, no detected murmur.  VASCULAR EXAM Carotid Bruits Right Left   Negative Negative     Abdominal aortic pulse is not palpable. Radial pulses are 2+ palpable and equal.                                                                                                                            LE Pulses Right Left       POPLITEAL  not palpable   not palpable       POSTERIOR TIBIAL  1+ palpable   1+ palpable        DORSALIS PEDIS      ANTERIOR TIBIAL 1+ palpable  not palpable     Gastrointestinal: soft, nontender, BS WNL, no r/g, no palpable masses. Musculoskeletal: no muscle atrophy/wasting. M/S 5/5 throughout, extremities without ischemic changes. Several moderately enlarged joints in hands (known OA).  Skin: No rashes, no ulcers, no cellulitis.   Neurologic:  A&O X 3; appropriate affect, sensation is normal; speech is normal, CN 2-12 intact, pain and light touch intact in extremities, motor exam as listed above. Psychiatric: Normal thought content, mood appropriate to clinical situation.    Assessment: Alyse Kathan is a 73 y.o. female who is status post right carotid endarterectomy on 06/29/13.  She has no history of stroke or TIA.  She walks in her neighborhood 1-2x/week, is afraid of dogs, is active in her yard and garden.    DATA Carotid Duplex (01-11-19): Right Carotid: Velocities in the right ICA are consistent  with a 1-39% stenosis. Left Carotid: Velocities in the left ICA are consistent with a 1-39% stenosis. Vertebrals:  Bilateral vertebral arteries demonstrate antegrade flow. Subclavians: Normal flow hemodynamics were seen in bilateral subclavian arteries. No change compared to the exam on 09-29-17.   Plan: Follow-up in 18 months with Carotid Duplex scan.   I discussed in depth with the patient the nature of atherosclerosis, and emphasized the importance of maximal medical management including strict control of blood pressure, blood glucose, and lipid levels, obtaining regular exercise, and continued cessation of smoking.  The patient is aware that without maximal medical management the underlying atherosclerotic disease process will progress, limiting the benefit of any interventions. The patient was given information about stroke prevention and what symptoms should prompt the patient to seek immediate medical care. Thank you for allowing [place] to participate in this patient's care.  Clemon Chambers, RN, MSN, FNP-C [facility] Office: [phone]  Clinic Physician: Laqueta Due  01/11/19 12:40 PM

## 2019-01-26 NOTE — Progress Notes (Signed)
Office Visit Note  Patient: Faith Gaines             Date of Birth: 02/15/1946           MRN: [id number]             PCP: Maury Dus, MD Referring: Maury Dus, MD Visit Date: 01/30/2019 Occupation: @GUAROCC @  Subjective:  Left index trigger finger and right knee joint pain.   History of Present Illness: Faith Gaines is a 73 y.o. female with history of osteoarthritis.  She states for the last few months she has been having left index trigger finger.  Intermittently her symptoms get worse.  She states that she has been also having discomfort in her right knee joint for the last few months.  She states that it may swell off and on but she is not sure.  Activities of Daily Living:  Patient reports morning stiffness for 30 minutes.   Patient Reports nocturnal pain.  Difficulty dressing/grooming: Denies Difficulty climbing stairs: Denies Difficulty getting out of chair: Denies Difficulty using hands for taps, buttons, cutlery, and/or writing: Reports  Review of Systems  Constitutional: Negative for fatigue, night sweats, weight gain and weight loss.  HENT: Negative for mouth sores, trouble swallowing, trouble swallowing, mouth dryness and nose dryness.   Eyes: Negative for pain, redness, itching, visual disturbance and dryness.  Respiratory: Negative for cough, shortness of breath, wheezing and difficulty breathing.   Cardiovascular: Negative for chest pain, palpitations, hypertension, irregular heartbeat and swelling in legs/feet.  Gastrointestinal: Negative for abdominal pain, blood in stool, constipation and diarrhea.  Endocrine: Negative for increased urination.  Genitourinary: Negative for painful urination, pelvic pain and vaginal dryness.  Musculoskeletal: Positive for arthralgias, joint pain, joint swelling and morning stiffness. Negative for myalgias, muscle weakness, muscle tenderness and myalgias.  Skin: Negative for color change, rash, hair loss,  redness, skin tightness, ulcers and sensitivity to sunlight.  Allergic/Immunologic: Negative for susceptible to infections.  Neurological: Negative for dizziness, light-headedness, headaches, memory loss, night sweats and weakness.  Hematological: Positive for bruising/bleeding tendency. Negative for swollen glands.  Psychiatric/Behavioral: Negative for depressed mood, confusion and sleep disturbance. The patient is not nervous/anxious.     PMFS History:  Patient Active Problem List   Diagnosis Date Noted  . Primary osteoarthritis of both hands 01/30/2019  . Prolapse of uterus 10/22/2014  . Iron deficiency anemia due to chronic blood loss   . Carotid stenosis 01/23/2014  . History of melanoma excision 01/16/2014  . DM (diabetes mellitus), type 2 with peripheral vascular complications ([place]) 62/22/9798  . Dyspnea 01/16/2014  . DM (diabetes mellitus), type 2 with renal complications ([place])   . Hyperlipidemia   . Hypertension   . Carotid bruit   . Osteopenia   . Peripheral vascular disease ([place])   . GERD (gastroesophageal reflux disease)   . Diarrhea   . Headache(784.0) 08/01/2013  . Carotid artery disease ([place]) 06/29/2013  . Occlusion and stenosis of carotid artery without mention of cerebral infarction 06/27/2013  . Breast cancer of upper-outer quadrant of left female breast, Tis, 3 oclock 06/23/2013    Past Medical History:  Diagnosis Date  . Anxiety    no meds  . Breast cancer ([place]) 2015   left breast   . Cancer ([place])    breast Left 08/2013 surgery, melanoma  . Carotid artery occlusion   . Carotid bruit   . CKD (chronic kidney disease), stage III ([facility])    no meds, blood draws q  48mos by Dr. Alyson Ingles in [place]  . Depression    no meds  . Diabetes mellitus without complication ([place])    type 2  . Diarrhea    sees Dr. Collene Mares  . GERD (gastroesophageal reflux disease)    occ  . Headache(784.0)   . Hyperlipidemia    diet controlled, no meds  . Hypertension   . Iron  deficiency anemia   . Iron deficiency anemia due to chronic blood loss   . Osteopenia   . Peripheral vascular disease ([place])    rt carotid stenosis  . Pneumonia 12/2011, 05/2012   hx x2 in 2013  . PONV (postoperative nausea and vomiting)   . SVD (spontaneous vaginal delivery)    x 3  . Trigger finger    resolved with cortisone injections  . Urgency of urination    urgency and frequency; loss of control at times    Family History  Problem Relation Age of Onset  . Cancer Mother        breast, uterus, lung  . Heart attack Mother   . Stroke Mother   . Heart disease Mother        Open Heart Surgery  . Hypertension Mother   . Varicose Veins Mother   . Heart disease Father   . Heart attack Father   . Kidney disease Father   . Hypertension Father   . Diabetes Father   . Cancer Father   . Asthma Father   . Heart attack Sister   . Heart disease Sister   . Healthy Daughter   . Healthy Daughter   . Healthy Son    Past Surgical History:  Procedure Laterality Date  . ANTERIOR AND POSTERIOR REPAIR N/A 10/22/2014   Procedure: CYSTO, REPAIR CYSTOCELE/RECTOCELE;  Surgeon: Bjorn Loser, MD;  Location: [facility];  Service: Urology;  Laterality: N/A;  . APPENDECTOMY    . BLADDER SURGERY  05/1981   bladder tack  . BREAST LUMPECTOMY Left 2015  . CHOLECYSTECTOMY  1995  . COLONOSCOPY    . ENDARTERECTOMY Right 06/29/2013   Procedure: ENDARTERECTOMY CAROTID-RIGHT, WITH DACRON PATCH ANGIOPLASTY;  Surgeon: Mal Misty, MD;  Location: [facility];  Service: Vascular;  Laterality: Right;  . LAPAROSCOPIC ASSISTED VAGINAL HYSTERECTOMY N/A 10/22/2014   Procedure: LAPAROSCOPIC ASSISTED VAGINAL HYSTERECTOMY;  Surgeon: Marylynn Pearson, MD;  Location: [facility];  Service: Gynecology;  Laterality: N/A;  Pam from Dr. Mikle Bosworth office will call and add additional surgery for this patient in our block time.  Marland Kitchen MELANOMA EXCISION  12/1980   left leg,  . PARTIAL MASTECTOMY WITH NEEDLE LOCALIZATION Left 08/22/2013    Procedure: PARTIAL MASTECTOMY WITH NEEDLE LOCALIZATION;  Surgeon: Stark Klein, MD;  Location: [facility];  Service: General;  Laterality: Left;  . SALPINGOOPHORECTOMY Bilateral 10/22/2014   Procedure: SALPINGO OOPHORECTOMY;  Surgeon: Marylynn Pearson, MD;  Location: [facility];  Service: Gynecology;  Laterality: Bilateral;  . 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 TUBAL LIGATION    . VAGINAL PROLAPSE REPAIR N/A 10/22/2014   Procedure: VAULT PROLAPSE WITH GRAFT;  Surgeon: Bjorn Loser, MD;  Location: [facility];  Service: Urology;  Laterality: N/A;  . WISDOM TOOTH EXTRACTION     Social History   Social History Narrative  . Not on file   Immunization History  Administered Date(s) Administered  . Influenza Split 04/19/2013  . Pneumococcal-Unspecified 12/18/2012     Objective: Vital Signs: BP 138/79 (BP Location: Left Arm, Patient Position: Sitting, Cuff  Size: Normal)   Pulse 63   Resp 12   Ht 5\' 1"  (1.549 m)   Wt 119 lb 3.2 oz (54.1 kg)   BMI 22.52 kg/m    Physical Exam Vitals signs and nursing note reviewed.  Constitutional:      Appearance: She is well-developed.  HENT:     Head: Normocephalic and atraumatic.  Eyes:     Conjunctiva/sclera: Conjunctivae normal.  Neck:     Musculoskeletal: Normal range of motion.  Cardiovascular:     Rate and Rhythm: Normal rate and regular rhythm.     Heart sounds: Normal heart sounds.  Pulmonary:     Effort: Pulmonary effort is normal.     Breath sounds: Normal breath sounds.  Abdominal:     General: Bowel sounds are normal.     Palpations: Abdomen is soft.  Lymphadenopathy:     Cervical: No cervical adenopathy.  Skin:    General: Skin is warm and dry.     Capillary Refill: Capillary refill takes less than 2 seconds.  Neurological:     Mental Status: She is alert and oriented to person, place, and time.  Psychiatric:        Behavior: Behavior normal.      Musculoskeletal Exam: C-spine thoracic and lumbar spine with good  range of motion.  Shoulder joints elbow joints wrist joints with good range of motion.  She has bilateral DIP and PIP thickening consistent with osteoarthritis.  She has left trigger index finger.  Hip joints, knee joints, ankles, MTPs PIPs with good range of motion.  She had painful range of motion of her right knee joint without any warmth swelling or effusion.  CDAI Exam: CDAI Score: - Patient Global: -; Provider Global: - Swollen: -; Tender: - Joint Exam   No joint exam has been documented for this visit   There is currently no information documented on the homunculus. Go to the Rheumatology activity and complete the homunculus joint exam.  Investigation: No additional findings.  Imaging: Vas US Carotid  Result Date: 01/11/2019 Carotid Arterial Duplex Study Indications:  Right carotid endarterectomy 06/29/2013. Risk Factors: Hypertension, hyperlipidemia, insulin dependent diabetes mellitus,               Diabetes. Performing Technologist: Ronal Fear RVS, RCS  Examination Guidelines: A complete evaluation includes B-mode imaging, spectral Doppler, color Doppler, and power Doppler as needed of all accessible portions of each vessel. Bilateral testing is considered an integral part of a complete examination. Limited examinations for reoccurring indications may be performed as noted.  Right Carotid Findings: +----------+--------+--------+--------+-----------+--------+           PSV cm/sEDV cm/sStenosisDescribe   Comments +----------+--------+--------+--------+-----------+--------+ CCA Prox  64      13                                  +----------+--------+--------+--------+-----------+--------+ CCA Mid   66      13              homogeneous         +----------+--------+--------+--------+-----------+--------+ CCA Distal57      14              homogeneous         +----------+--------+--------+--------+-----------+--------+ ICA Prox  38      9       1-39%    homogeneous         +----------+--------+--------+--------+-----------+--------+ ICA  Mid   64      19                                  +----------+--------+--------+--------+-----------+--------+ ICA Distal77      22                                  +----------+--------+--------+--------+-----------+--------+ ECA       91      6                                   +----------+--------+--------+--------+-----------+--------+ +----------+--------+-------+----------------+-------------------+           PSV cm/sEDV cmsDescribe        Arm Pressure (mmHG) +----------+--------+-------+----------------+-------------------+ [id number]            Multiphasic, WNL                    +----------+--------+-------+----------------+-------------------+ +---------+--------+--+--------+--+---------+ VertebralPSV cm/s49EDV cm/s13Antegrade +---------+--------+--+--------+--+---------+  Left Carotid Findings: +----------+--------+--------+--------+------------+--------+           PSV cm/sEDV cm/sStenosisDescribe    Comments +----------+--------+--------+--------+------------+--------+ CCA Prox  99      13              homogeneous          +----------+--------+--------+--------+------------+--------+ CCA Mid   72      10              heterogenous         +----------+--------+--------+--------+------------+--------+ CCA Distal65      13              heterogenous         +----------+--------+--------+--------+------------+--------+ ICA Prox  46      12      1-39%   heterogenous         +----------+--------+--------+--------+------------+--------+ ICA Mid   65      20                                   +----------+--------+--------+--------+------------+--------+ ICA Distal78      25                                   +----------+--------+--------+--------+------------+--------+ ECA       77      8                                     +----------+--------+--------+--------+------------+--------+ +----------+--------+--------+----------------+-------------------+ SubclavianPSV cm/sEDV cm/sDescribe        Arm Pressure (mmHG) +----------+--------+--------+----------------+-------------------+           132             Multiphasic, WNL                    +----------+--------+--------+----------------+-------------------+ +---------+--------+--+--------+--+---------+ VertebralPSV cm/s75EDV cm/s13Antegrade +---------+--------+--+--------+--+---------+  Summary: Right Carotid: Velocities in the right ICA are consistent with a 1-39% stenosis. Left Carotid: Velocities in the left ICA are consistent with a 1-39% stenosis. Vertebrals:  Bilateral vertebral arteries demonstrate antegrade flow. Subclavians: Normal flow hemodynamics were seen in bilateral subclavian  arteries. *See table(s) above for measurements and observations.  Electronically signed by Deitra Mayo MD on 01/11/2019 at 2:10:48 PM.    Final     Recent Labs: Lab Results  Component Value Date   WBC 11.5 (H) 10/10/2014   HGB 11.9 (L) 10/10/2014   PLT 271 10/10/2014   NA 138 10/10/2014   K 5.0 10/10/2014   CL 106 10/10/2014   CO2 22 10/10/2014   GLUCOSE 149 (H) 10/10/2014   BUN 20 10/10/2014   CREATININE 1.09 10/10/2014   BILITOT 0.2 (L) 06/28/2013   ALKPHOS 95 06/28/2013   AST 17 06/28/2013   ALT 17 06/28/2013   PROT 7.5 06/28/2013   ALBUMIN 4.1 06/28/2013   CALCIUM 10.5 10/10/2014   GFRAA 60 (L) 10/10/2014    Speciality Comments: No specialty comments available.  Procedures:  Hand/UE Inj: L index A1 for trigger finger on 01/30/2019 10:42 AM Indications: pain, tendon swelling and therapeutic Details: 27 G needle, ultrasound-guided volar approach Medications: 0.5 mL lidocaine 1 %; 10 mg triamcinolone acetonide 40 MG/ML Aspirate: 0 mL Procedure, treatment alternatives, risks and benefits explained, specific risks discussed.  Immediately prior to procedure a time out was called to verify the correct patient, procedure, equipment, support staff and site/side marked as required. Patient was prepped and draped in the usual sterile fashion.     Allergies: Codeine, Ace inhibitors, Clarithromycin, Crestor [rosuvastatin], Lipitor [atorvastatin], and Avandamet [rosiglitazone-metformin]   Assessment / Plan:     Visit Diagnoses: Trigger index finger of left hand -patient is having increased pain and discomfort in her left index finger which has been triggering.  She is unable to make flex her index finger.  Different treatment options were discussed.  Per her request we decided to proceed with a cortisone injection.  The procedure was described above.  A splint visit applied to the finger to be used for the next 3 days.  Postprocedure precautions were discussed.  Primary osteoarthritis of both hands -clinical findings are consistent with osteoarthritis.  Joint protection muscle strengthening was discussed.  Chronic pain of right knee -she is having increased pain and discomfort in her right knee joint with walking.  Plan x-ray of the right knee joint 3 views.  X-rays revealed mild osteoarthritis and mild chondromalacia patella.  I have given her a handout on knee exercises.  I also advised Voltaren gel which can be used topically.  Side effects were reviewed.  Osteopenia of multiple sites - Plan: Use of calcium, vitamin D and resistive exercise was discussed.  History of diabetes mellitus   History of melanoma excision   History of hypertension -blood pressure is fairly well-controlled today.  History of hyperlipidemia  History of breast cancer   Orders: No orders of the defined types were placed in this encounter.  No orders of the defined types were placed in this encounter.    Follow-Up Instructions: Return for Osteoarthritis.   Bo Merino, MD  Note - This record has been created using Editor, commissioning.   Chart creation errors have been sought, but may not always  have been located. Such creation errors do not reflect on  the standard of medical care.

## 2019-01-30 ENCOUNTER — Other Ambulatory Visit: Payer: Self-pay

## 2019-01-30 ENCOUNTER — Ambulatory Visit (INDEPENDENT_AMBULATORY_CARE_PROVIDER_SITE_OTHER): Payer: Medicare Other | Admitting: Rheumatology

## 2019-01-30 ENCOUNTER — Ambulatory Visit (INDEPENDENT_AMBULATORY_CARE_PROVIDER_SITE_OTHER): Payer: Medicare Other

## 2019-01-30 ENCOUNTER — Encounter: Payer: Self-pay | Admitting: Rheumatology

## 2019-01-30 VITALS — BP 138/79 | HR 63 | Resp 12 | Ht 61.0 in | Wt 119.2 lb

## 2019-01-30 DIAGNOSIS — M19041 Primary osteoarthritis, right hand: Secondary | ICD-10-CM | POA: Diagnosis not present

## 2019-01-30 DIAGNOSIS — Z85820 Personal history of malignant melanoma of skin: Secondary | ICD-10-CM | POA: Diagnosis not present

## 2019-01-30 DIAGNOSIS — Z8679 Personal history of other diseases of the circulatory system: Secondary | ICD-10-CM

## 2019-01-30 DIAGNOSIS — Z9889 Other specified postprocedural states: Secondary | ICD-10-CM

## 2019-01-30 DIAGNOSIS — Z853 Personal history of malignant neoplasm of breast: Secondary | ICD-10-CM

## 2019-01-30 DIAGNOSIS — M25561 Pain in right knee: Secondary | ICD-10-CM

## 2019-01-30 DIAGNOSIS — Z8639 Personal history of other endocrine, nutritional and metabolic disease: Secondary | ICD-10-CM | POA: Diagnosis not present

## 2019-01-30 DIAGNOSIS — M19042 Primary osteoarthritis, left hand: Secondary | ICD-10-CM

## 2019-01-30 DIAGNOSIS — G8929 Other chronic pain: Secondary | ICD-10-CM

## 2019-01-30 DIAGNOSIS — M65322 Trigger finger, left index finger: Secondary | ICD-10-CM

## 2019-01-30 DIAGNOSIS — M8589 Other specified disorders of bone density and structure, multiple sites: Secondary | ICD-10-CM | POA: Diagnosis not present

## 2019-01-30 DIAGNOSIS — I6523 Occlusion and stenosis of bilateral carotid arteries: Secondary | ICD-10-CM

## 2019-01-30 MED ORDER — TRIAMCINOLONE ACETONIDE 40 MG/ML IJ SUSP
10.0000 mg | INTRAMUSCULAR | Status: AC | PRN
  Administered 2019-01-30: 10 mg

## 2019-01-30 MED ORDER — LIDOCAINE HCL 1 % IJ SOLN
0.5000 mL | INTRAMUSCULAR | Status: AC | PRN
Start: 2019-01-30 — End: 2019-01-30
  Administered 2019-01-30: .5 mL

## 2019-01-30 NOTE — Patient Instructions (Signed)
Journal for Nurse Practitioners, 15(4), 263-267. Retrieved April 25, 2018 from http://clinicalkey.com/nursing">  Knee Exercises Ask your health care provider which exercises are safe for you. Do exercises exactly as told by your health care provider and adjust them as directed. It is normal to feel mild stretching, pulling, tightness, or discomfort as you do these exercises. Stop right away if you feel sudden pain or your pain gets worse. Do not begin these exercises until told by your health care provider. Stretching and range-of-motion exercises These exercises warm up your muscles and joints and improve the movement and flexibility of your knee. These exercises also help to relieve pain and swelling. Knee extension, prone 1. Lie on your abdomen (prone position) on a bed. 2. Place your left / right knee just beyond the edge of the surface so your knee is not on the bed. You can put a towel under your left / right thigh just above your kneecap for comfort. 3. Relax your leg muscles and allow gravity to straighten your knee (extension). You should feel a stretch behind your left / right knee. 4. Hold this position for __________ seconds. 5. Scoot up so your knee is supported between repetitions. Repeat __________ times. Complete this exercise __________ times a day. Knee flexion, active  1. Lie on your back with both legs straight. If this causes back discomfort, bend your left / right knee so your foot is flat on the floor. 2. Slowly slide your left / right heel back toward your buttocks. Stop when you feel a gentle stretch in the front of your knee or thigh (flexion). 3. Hold this position for __________ seconds. 4. Slowly slide your left / right heel back to the starting position. Repeat __________ times. Complete this exercise __________ times a day. Quadriceps stretch, prone  1. Lie on your abdomen on a firm surface, such as a bed or padded floor. 2. Bend your left / right knee and hold  your ankle. If you cannot reach your ankle or pant leg, loop a belt around your foot and grab the belt instead. 3. Gently pull your heel toward your buttocks. Your knee should not slide out to the side. You should feel a stretch in the front of your thigh and knee (quadriceps). 4. Hold this position for __________ seconds. Repeat __________ times. Complete this exercise __________ times a day. Hamstring, supine 1. Lie on your back (supine position). 2. Loop a belt or towel over the ball of your left / right foot. The ball of your foot is on the walking surface, right under your toes. 3. Straighten your left / right knee and slowly pull on the belt to raise your leg until you feel a gentle stretch behind your knee (hamstring). ? Do not let your knee bend while you do this. ? Keep your other leg flat on the floor. 4. Hold this position for __________ seconds. Repeat __________ times. Complete this exercise __________ times a day. Strengthening exercises These exercises build strength and endurance in your knee. Endurance is the ability to use your muscles for a long time, even after they get tired. Quadriceps, isometric This exercise stretches the muscles in front of your thigh (quadriceps) without moving your knee joint (isometric). 1. Lie on your back with your left / right leg extended and your other knee bent. Put a rolled towel or small pillow under your knee if told by your health care provider. 2. Slowly tense the muscles in the front of your left /   right thigh. You should see your kneecap slide up toward your hip or see increased dimpling just above the knee. This motion will push the back of the knee toward the floor. 3. For __________ seconds, hold the muscle as tight as you can without increasing your pain. 4. Relax the muscles slowly and completely. Repeat __________ times. Complete this exercise __________ times a day. Straight leg raises This exercise stretches the muscles in front  of your thigh (quadriceps) and the muscles that move your hips (hip flexors). 1. Lie on your back with your left / right leg extended and your other knee bent. 2. Tense the muscles in the front of your left / right thigh. You should see your kneecap slide up or see increased dimpling just above the knee. Your thigh may even shake a bit. 3. Keep these muscles tight as you raise your leg 4-6 inches (10-15 cm) off the floor. Do not let your knee bend. 4. Hold this position for __________ seconds. 5. Keep these muscles tense as you lower your leg. 6. Relax your muscles slowly and completely after each repetition. Repeat __________ times. Complete this exercise __________ times a day. Hamstring, isometric 1. Lie on your back on a firm surface. 2. Bend your left / right knee about __________ degrees. 3. Dig your left / right heel into the surface as if you are trying to pull it toward your buttocks. Tighten the muscles in the back of your thighs (hamstring) to "dig" as hard as you can without increasing any pain. 4. Hold this position for __________ seconds. 5. Release the tension gradually and allow your muscles to relax completely for __________ seconds after each repetition. Repeat __________ times. Complete this exercise __________ times a day. Hamstring curls If told by your health care provider, do this exercise while wearing ankle weights. Begin with __________ lb weights. Then increase the weight by 1 lb (0.5 kg) increments. Do not wear ankle weights that are more than __________ lb. 1. Lie on your abdomen with your legs straight. 2. Bend your left / right knee as far as you can without feeling pain. Keep your hips flat against the floor. 3. Hold this position for __________ seconds. 4. Slowly lower your leg to the starting position. Repeat __________ times. Complete this exercise __________ times a day. Squats This exercise strengthens the muscles in front of your thigh and knee  (quadriceps). 1. Stand in front of a table, with your feet and knees pointing straight ahead. You may rest your hands on the table for balance but not for support. 2. Slowly bend your knees and lower your hips like you are going to sit in a chair. ? Keep your weight over your heels, not over your toes. ? Keep your lower legs upright so they are parallel with the table legs. ? Do not let your hips go lower than your knees. ? Do not bend lower than told by your health care provider. ? If your knee pain increases, do not bend as low. 3. Hold the squat position for __________ seconds. 4. Slowly push with your legs to return to standing. Do not use your hands to pull yourself to standing. Repeat __________ times. Complete this exercise __________ times a day. Wall slides This exercise strengthens the muscles in front of your thigh and knee (quadriceps). 1. Lean your back against a smooth wall or door, and walk your feet out 18-24 inches (46-61 cm) from it. 2. Place your feet hip-width apart. 3.   Slowly slide down the wall or door until your knees bend __________ degrees. Keep your knees over your heels, not over your toes. Keep your knees in line with your hips. 4. Hold this position for __________ seconds. Repeat __________ times. Complete this exercise __________ times a day. Straight leg raises This exercise strengthens the muscles that rotate the leg at the hip and move it away from your body (hip abductors). 1. Lie on your side with your left / right leg in the top position. Lie so your head, shoulder, knee, and hip line up. You may bend your bottom knee to help you keep your balance. 2. Roll your hips slightly forward so your hips are stacked directly over each other and your left / right knee is facing forward. 3. Leading with your heel, lift your top leg 4-6 inches (10-15 cm). You should feel the muscles in your outer hip lifting. ? Do not let your foot drift forward. ? Do not let your knee  roll toward the ceiling. 4. Hold this position for __________ seconds. 5. Slowly return your leg to the starting position. 6. Let your muscles relax completely after each repetition. Repeat __________ times. Complete this exercise __________ times a day. Straight leg raises This exercise stretches the muscles that move your hips away from the front of the pelvis (hip extensors). 1. Lie on your abdomen on a firm surface. You can put a pillow under your hips if that is more comfortable. 2. Tense the muscles in your buttocks and lift your left / right leg about 4-6 inches (10-15 cm). Keep your knee straight as you lift your leg. 3. Hold this position for __________ seconds. 4. Slowly lower your leg to the starting position. 5. Let your leg relax completely after each repetition. Repeat __________ times. Complete this exercise __________ times a day. This information is not intended to replace advice given to you by your health care provider. Make sure you discuss any questions you have with your health care provider. Document Released: 05/20/2005 Document Revised: 04/26/2018 Document Reviewed: 04/26/2018 Elsevier Patient Education  2020 Elsevier Inc.  

## 2019-02-03 DIAGNOSIS — I129 Hypertensive chronic kidney disease with stage 1 through stage 4 chronic kidney disease, or unspecified chronic kidney disease: Secondary | ICD-10-CM | POA: Diagnosis not present

## 2019-02-03 DIAGNOSIS — I739 Peripheral vascular disease, unspecified: Secondary | ICD-10-CM | POA: Diagnosis not present

## 2019-02-03 DIAGNOSIS — E1122 Type 2 diabetes mellitus with diabetic chronic kidney disease: Secondary | ICD-10-CM | POA: Diagnosis not present

## 2019-02-03 DIAGNOSIS — K219 Gastro-esophageal reflux disease without esophagitis: Secondary | ICD-10-CM | POA: Diagnosis not present

## 2019-02-03 DIAGNOSIS — E782 Mixed hyperlipidemia: Secondary | ICD-10-CM | POA: Diagnosis not present

## 2019-02-03 DIAGNOSIS — N183 Chronic kidney disease, stage 3 (moderate): Secondary | ICD-10-CM | POA: Diagnosis not present

## 2019-02-27 DIAGNOSIS — I129 Hypertensive chronic kidney disease with stage 1 through stage 4 chronic kidney disease, or unspecified chronic kidney disease: Secondary | ICD-10-CM | POA: Diagnosis not present

## 2019-02-27 DIAGNOSIS — E1122 Type 2 diabetes mellitus with diabetic chronic kidney disease: Secondary | ICD-10-CM | POA: Diagnosis not present

## 2019-03-21 DIAGNOSIS — Z124 Encounter for screening for malignant neoplasm of cervix: Secondary | ICD-10-CM | POA: Diagnosis not present

## 2019-03-21 DIAGNOSIS — Z6822 Body mass index (BMI) 22.0-22.9, adult: Secondary | ICD-10-CM | POA: Diagnosis not present

## 2019-05-18 DIAGNOSIS — E611 Iron deficiency: Secondary | ICD-10-CM

## 2019-05-18 DIAGNOSIS — Z86000 Personal history of in-situ neoplasm of breast: Secondary | ICD-10-CM | POA: Diagnosis not present

## 2019-05-18 DIAGNOSIS — L659 Nonscarring hair loss, unspecified: Secondary | ICD-10-CM | POA: Diagnosis not present

## 2019-05-18 DIAGNOSIS — D509 Iron deficiency anemia, unspecified: Secondary | ICD-10-CM | POA: Diagnosis not present

## 2019-05-18 DIAGNOSIS — Z85820 Personal history of malignant melanoma of skin: Secondary | ICD-10-CM | POA: Diagnosis not present

## 2019-05-26 ENCOUNTER — Other Ambulatory Visit: Payer: Self-pay | Admitting: Oncology

## 2019-05-26 DIAGNOSIS — Z1231 Encounter for screening mammogram for malignant neoplasm of breast: Secondary | ICD-10-CM

## 2019-05-26 DIAGNOSIS — N644 Mastodynia: Secondary | ICD-10-CM

## 2019-06-06 ENCOUNTER — Ambulatory Visit: Payer: Medicare Other

## 2019-06-06 ENCOUNTER — Other Ambulatory Visit: Payer: Self-pay

## 2019-06-06 ENCOUNTER — Ambulatory Visit
Admission: RE | Admit: 2019-06-06 | Discharge: 2019-06-06 | Disposition: A | Discharge status: Home / Self Care | Payer: Medicare Other | Source: Ambulatory Visit | Attending: Family Medicine | Admitting: Family Medicine

## 2019-06-06 DIAGNOSIS — N644 Mastodynia: Secondary | ICD-10-CM

## 2019-06-06 DIAGNOSIS — R922 Inconclusive mammogram: Secondary | ICD-10-CM | POA: Diagnosis not present

## 2019-07-31 DIAGNOSIS — Z23 Encounter for immunization: Secondary | ICD-10-CM | POA: Diagnosis not present

## 2019-07-31 DIAGNOSIS — E782 Mixed hyperlipidemia: Secondary | ICD-10-CM | POA: Diagnosis not present

## 2019-07-31 DIAGNOSIS — E1122 Type 2 diabetes mellitus with diabetic chronic kidney disease: Secondary | ICD-10-CM | POA: Diagnosis not present

## 2019-07-31 DIAGNOSIS — N1831 Chronic kidney disease, stage 3a: Secondary | ICD-10-CM | POA: Diagnosis not present

## 2019-07-31 DIAGNOSIS — M545 Low back pain: Secondary | ICD-10-CM | POA: Diagnosis not present

## 2019-07-31 DIAGNOSIS — I129 Hypertensive chronic kidney disease with stage 1 through stage 4 chronic kidney disease, or unspecified chronic kidney disease: Secondary | ICD-10-CM | POA: Diagnosis not present

## 2019-07-31 DIAGNOSIS — K219 Gastro-esophageal reflux disease without esophagitis: Secondary | ICD-10-CM | POA: Diagnosis not present

## 2019-07-31 DIAGNOSIS — I739 Peripheral vascular disease, unspecified: Secondary | ICD-10-CM | POA: Diagnosis not present

## 2019-07-31 DIAGNOSIS — Z1389 Encounter for screening for other disorder: Secondary | ICD-10-CM | POA: Diagnosis not present

## 2019-07-31 DIAGNOSIS — Z0000 Encounter for general adult medical examination without abnormal findings: Secondary | ICD-10-CM | POA: Diagnosis not present

## 2019-10-10 DIAGNOSIS — L821 Other seborrheic keratosis: Secondary | ICD-10-CM | POA: Diagnosis not present

## 2019-10-10 DIAGNOSIS — L820 Inflamed seborrheic keratosis: Secondary | ICD-10-CM | POA: Diagnosis not present

## 2019-10-10 DIAGNOSIS — L578 Other skin changes due to chronic exposure to nonionizing radiation: Secondary | ICD-10-CM | POA: Diagnosis not present

## 2019-10-10 DIAGNOSIS — L661 Lichen planopilaris: Secondary | ICD-10-CM | POA: Diagnosis not present

## 2019-10-10 DIAGNOSIS — Z85820 Personal history of malignant melanoma of skin: Secondary | ICD-10-CM | POA: Diagnosis not present

## 2019-10-10 DIAGNOSIS — Z85828 Personal history of other malignant neoplasm of skin: Secondary | ICD-10-CM | POA: Diagnosis not present

## 2019-10-10 DIAGNOSIS — L814 Other melanin hyperpigmentation: Secondary | ICD-10-CM | POA: Diagnosis not present

## 2019-10-10 DIAGNOSIS — Z23 Encounter for immunization: Secondary | ICD-10-CM | POA: Diagnosis not present

## 2019-10-10 DIAGNOSIS — Z86018 Personal history of other benign neoplasm: Secondary | ICD-10-CM | POA: Diagnosis not present

## 2019-10-10 DIAGNOSIS — D225 Melanocytic nevi of trunk: Secondary | ICD-10-CM | POA: Diagnosis not present

## 2019-12-26 ENCOUNTER — Encounter: Payer: Self-pay | Admitting: Cardiology

## 2019-12-26 DIAGNOSIS — Z7189 Other specified counseling: Secondary | ICD-10-CM | POA: Insufficient documentation

## 2019-12-26 DIAGNOSIS — R072 Precordial pain: Secondary | ICD-10-CM | POA: Insufficient documentation

## 2019-12-26 NOTE — Progress Notes (Signed)
Cardiology Office Note   Date:  12/27/2019   ID:  Faith Gaines, DOB [birth year], MRN [id number]  PCP:  Maury Dus, MD  Cardiologist:   No primary care provider on file. Referring:  Maury Dus, MD  Chief Complaint  Patient presents with  . Chest Pain      History of Present Illness: Faith Gaines is a 74 y.o. female who is referred for evaluation of chest pain.  The patient has no past cardiac history but she does have peripheral vascular disease with carotid endarterectomy and she has a very strong family history of early heart disease.  She reports following the [place]der[place] vaccine earlier this year she felt very poorly and her heart was racing.  He had the delay in getting the second dose.  She says that since that and perhaps before she has been more short of breath.  She gets short of breath climbing the stairs.  She has to rest for a few minutes.  She is very fatigued.  She also has some chest discomfort.  It is a mid chest discomfort.  There is some right upper chest component of it.  It radiates sometimes down the right arm and around to the back.  This sometimes happens with the shortness of breath with exertion.  She is not describing PND or orthopnea or resting complaints.  She had very little mild lower extremity swelling.  She is not having any new palpitations, presyncope or syncope.  Of note she does report perhaps 6 or longer years ago some stress testing done in [place] for evaluation of chest pain that she thinks was normal.  Past Medical History:  Diagnosis Date  . Anxiety    no meds  . Breast cancer ([place]) 2015   left breast   . CKD (chronic kidney disease), stage III    no meds, blood draws q 61mos by Dr. Alyson Ingles in [place]  . Depression    no meds  . Diabetes mellitus without complication ([place])    type 2  . GERD (gastroesophageal reflux disease)    occ  . Hyperlipidemia    diet controlled, no meds  . Hypertension   . Iron deficiency  anemia   . Osteopenia   . Peripheral vascular disease ([place])    rt carotid stenosis  . Pneumonia 12/2011, 05/2012   hx x2 in 2013  . PONV (postoperative nausea and vomiting)   . Trigger finger    resolved with cortisone injections    Past Surgical History:  Procedure Laterality Date  . ANTERIOR AND POSTERIOR REPAIR N/A 10/22/2014   Procedure: CYSTO, REPAIR CYSTOCELE/RECTOCELE;  Surgeon: Bjorn Loser, MD;  Location: [facility];  Service: Urology;  Laterality: N/A;  . APPENDECTOMY    . BLADDER SURGERY  05/1981   bladder tack  . BREAST LUMPECTOMY Left 2015  . CHOLECYSTECTOMY  1995  . COLONOSCOPY    . ENDARTERECTOMY Right 06/29/2013   Procedure: ENDARTERECTOMY CAROTID-RIGHT, WITH DACRON PATCH ANGIOPLASTY;  Surgeon: Mal Misty, MD;  Location: [facility];  Service: Vascular;  Laterality: Right;  . LAPAROSCOPIC ASSISTED VAGINAL HYSTERECTOMY N/A 10/22/2014   Procedure: LAPAROSCOPIC ASSISTED VAGINAL HYSTERECTOMY;  Surgeon: Marylynn Pearson, MD;  Location: [facility];  Service: Gynecology;  Laterality: N/A;  Pam from Dr. Mikle Bosworth office will call and add additional surgery for this patient in our block time.  Marland Kitchen MELANOMA EXCISION  12/1980   left leg,  . PARTIAL MASTECTOMY WITH NEEDLE LOCALIZATION Left 08/22/2013   Procedure:  PARTIAL MASTECTOMY WITH NEEDLE LOCALIZATION;  Surgeon: Stark Klein, MD;  Location: [facility];  Service: General;  Laterality: Left;  . SALPINGOOPHORECTOMY Bilateral 10/22/2014   Procedure: SALPINGO OOPHORECTOMY;  Surgeon: Marylynn Pearson, MD;  Location: [facility];  Service: Gynecology;  Laterality: Bilateral;  . 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 TUBAL LIGATION    . VAGINAL PROLAPSE REPAIR N/A 10/22/2014   Procedure: VAULT PROLAPSE WITH GRAFT;  Surgeon: Bjorn Loser, MD;  Location: [facility];  Service: Urology;  Laterality: N/A;  . WISDOM TOOTH EXTRACTION       Current Outpatient Medications  Medication Sig Dispense Refill  . aspirin EC 81 MG tablet Take 81 mg by  mouth daily.    . Canagliflozin (INVOKANA) 300 MG TABS Take 300 mg by mouth daily.     . nebivolol (BYSTOLIC) 5 MG tablet Take 5 mg by mouth daily.    . Nebivolol HCl (BYSTOLIC) 20 MG TABS Take 1 tablet by mouth daily.    . Omega-3 Fatty Acids (OMEGA-3 2100) 1050 MG CAPS Take by mouth daily.    . pantoprazole (PROTONIX) 40 MG tablet Take 40 mg by mouth daily.    . Saxagliptin-Metformin (KOMBIGLYZE XR) 2.11-998 MG TB24 Take 1 tablet by mouth 2 (two) times daily. Reported on 08/21/2015    . VITAMIN D PO Take 1,000 Units by mouth daily.     No current facility-administered medications for this visit.    Allergies:   Codeine, Ace inhibitors, Clarithromycin, Crestor [rosuvastatin], Lipitor [atorvastatin], and Avandamet [rosiglitazone-metformin]    Social History:  The patient  reports that she quit smoking about 9 years ago. Her smoking use included cigarettes. She has a 0.75 pack-year smoking history. She has never used smokeless tobacco. She reports that she does not drink alcohol or use drugs.   Family History:  The patient's family history includes Asthma in her father; Cancer in her father and mother; Diabetes in her father; Healthy in her daughter, daughter, and son; Heart attack (age of onset: 36) in her sister; Heart attack (age of onset: 29) in her father; Heart attack (age of onset: 74) in her mother; Heart disease in her father, mother, and sister; Hypertension in her father and mother; Kidney disease in her father; Stroke in her mother; Varicose Veins in her mother.    ROS:  Please see the history of present illness.   Otherwise, review of systems are positive for none.   All other systems are reviewed and negative.    PHYSICAL EXAM: VS:  BP 114/72   Pulse 65   Ht 5\' 1"  (1.549 m)   Wt 118 lb (53.5 kg)   BMI 22.30 kg/m  , BMI Body mass index is 22.3 kg/m. GENERAL:  Well appearing HEENT:  Pupils equal round and reactive, fundi not visualized, oral mucosa unremarkable NECK:  No  jugular venous distention, waveform within normal limits, carotid upstroke brisk and symmetric, no bruits, no thyromegaly LYMPHATICS:  No cervical, inguinal adenopathy LUNGS:  Clear to auscultation bilaterally BACK:  No CVA tenderness CHEST:  Unremarkable HEART:  PMI not displaced or sustained,S1 and S2 within normal limits, no S3, no S4, no clicks, no rubs, no murmurs ABD:  Flat, positive bowel sounds normal in frequency in pitch, no bruits, no rebound, no guarding, no midline pulsatile mass, no hepatomegaly, no splenomegaly EXT:  2 plus pulses throughout, no edema, no cyanosis no clubbing SKIN:  No rashes no nodules NEURO:  Cranial nerves II through XII  grossly intact, motor grossly intact throughout PSYCH:  Cognitively intact, oriented to person place and time    EKG:  EKG is ordered today. The ekg ordered today demonstrates sinus rhythm, rate 65, axis within normal limits, intervals within normal limits, no acute ST-T wave changes.   Recent Labs: No results found for requested labs within last 8760 hours.    Lipid Panel No results found for: CHOL, TRIG, HDL, CHOLHDL, VLDL, LDLCALC, LDLDIRECT    Wt Readings from Last 3 Encounters:  12/27/19 118 lb (53.5 kg)  01/30/19 119 lb 3.2 oz (54.1 kg)  01/11/19 116 lb 6.4 oz (52.8 kg)      Other studies Reviewed: Additional studies/ records that were reviewed today include: Labs. Review of the above records demonstrates:  Please see elsewhere in the note.     ASSESSMENT AND PLAN:   CHEST PAIN:   The patient is having chest discomfort as described.  She also has shortness of breath.  I think the pretest probability of obstructive coronary disease is high.  Coronary CT angiography is indicated.  She also needs aggressive risk reduction.  SOB: I will evaluate as above.  I will also check a BNP level.  If all the testing above is unrevealing she will need pulmonary function testing.  O2 sats stayed in the high 90s with ambulation.    FATIGUE: This is a significant complaint.  I was able to review some records from her primary provider and see that she had a normal TSH but this was in January.  She is not anemic.  I will repeat this blood work.  PVD: She has her carotid stenosis followed by her primary provider.  DM: A1c was 7.6.  I will defer to her primary provider.  DYSLIPIDEMIA: She has been intolerant of all but the lowest dose of statin.  I am going to refer her to our Lipid Clinic to start PCSK9.  COVID EDUCATION: She has had her vaccine as above.   Current medicines are reviewed at length with the patient today.  The patient does not have concerns regarding medicines.  The following changes have been made:  As above  Labs/ tests ordered today include:   Orders Placed This Encounter  Procedures  . CT CORONARY MORPH W/CTA COR W/SCORE W/CA W/CM &/OR WO/CM  . CT CORONARY FRACTIONAL FLOW RESERVE DATA PREP  . CT CORONARY FRACTIONAL FLOW RESERVE FLUID ANALYSIS  . CBC  . Basic metabolic panel  . Brain natriuretic peptide  . TSH  . EKG 12-Lead     Disposition:   FU with me after the above testing.     Signed, Minus Breeding, MD  12/27/2019 11:36 AM    [facility]

## 2019-12-27 ENCOUNTER — Telehealth: Payer: Self-pay | Admitting: *Deleted

## 2019-12-27 ENCOUNTER — Other Ambulatory Visit: Payer: Self-pay

## 2019-12-27 ENCOUNTER — Encounter: Payer: Self-pay | Admitting: Cardiology

## 2019-12-27 ENCOUNTER — Ambulatory Visit (INDEPENDENT_AMBULATORY_CARE_PROVIDER_SITE_OTHER): Payer: Medicare Other | Admitting: Cardiology

## 2019-12-27 ENCOUNTER — Ambulatory Visit: Payer: Medicare Other | Admitting: Cardiology

## 2019-12-27 VITALS — BP 114/72 | HR 65 | Ht 61.0 in | Wt 118.0 lb

## 2019-12-27 DIAGNOSIS — R0602 Shortness of breath: Secondary | ICD-10-CM

## 2019-12-27 DIAGNOSIS — Z7189 Other specified counseling: Secondary | ICD-10-CM | POA: Diagnosis not present

## 2019-12-27 DIAGNOSIS — R072 Precordial pain: Secondary | ICD-10-CM | POA: Diagnosis not present

## 2019-12-27 DIAGNOSIS — R5383 Other fatigue: Secondary | ICD-10-CM | POA: Diagnosis not present

## 2019-12-27 NOTE — Telephone Encounter (Signed)
Spoke with patient to schedule Pharm D appointment to discuss PCSK9---she wants to wait until Dr. Percival Spanish has her Cardiac CT results to schedule

## 2019-12-27 NOTE — Patient Instructions (Signed)
Medication Instructions:  NO CHANGES *If you need a refill on your cardiac medications before your next appointment, please call your pharmacy*  Lab Work: Your physician recommends that you return for lab work TODAY (CBC, BMP, TSH, BNP) If you have labs (blood work) drawn today and your tests are completely normal, you will receive your results only by: Marland Kitchen MyChart Message (if you have MyChart) OR . A paper copy in the mail If you have any lab test that is abnormal or we need to change your treatment, we will call you to review the results.  Testing/Procedures: CORONARY CTA  Follow-Up: At [facility], you and your health needs are our priority.  As part of our continuing mission to provide you with exceptional heart care, we have created designated Provider Care Teams.  These Care Teams include your primary Cardiologist (physician) and Advanced Practice Providers (APPs -  Physician Assistants and Nurse Practitioners) who all work together to provide you with the care you need, when you need it.  We recommend signing up for the patient portal called "MyChart".  Sign up information is provided on this After Visit Summary.  MyChart is used to connect with patients for Virtual Visits (Telemedicine).  Patients are able to view lab/test results, encounter notes, upcoming appointments, etc.  Non-urgent messages can be sent to your provider as well.   To learn more about what you can do with MyChart, go to [email or web address].    Your next appointment:   1 month(s)  The format for your next appointment:   In Person  Provider:   Minus Breeding, MD  Other Instructions  PHARM D APPOINTMENT FOR PCSK9 INITIATION  Your cardiac CT will be scheduled at the below location:   [facility] [address] [place], [place] [address] [phone]  If scheduled at [facility], please arrive at the [facility] main entrance of [facility] 30 minutes prior to test  start time. Proceed to the [facility] Radiology Department (first floor) to check-in and test prep.  Please follow these instructions carefully (unless otherwise directed):  On the Night Before the Test: . Be sure to Drink plenty of water. . Do not consume any caffeinated/decaffeinated beverages or chocolate 12 hours prior to your test. . Do not take any antihistamines 12 hours prior to your test.  On the Day of the Test: . Drink plenty of water. Do not drink any water within one hour of the test. . Do not eat any food 4 hours prior to the test. . You may take your regular medications prior to the test.  . HOLD Furosemide/Hydrochlorothiazide morning of the test. . FEMALES- please wear underwire-free bra if available       After the Test: . Drink plenty of water. . After receiving IV contrast, you may experience a mild flushed feeling. This is normal. . On occasion, you may experience a mild rash up to 24 hours after the test. This is not dangerous. If this occurs, you can take Benadryl 25 mg and increase your fluid intake. . If you experience trouble breathing, this can be serious. If it is severe call 911 IMMEDIATELY. If it is mild, please call our office. . If you take any of these medications: Glipizide/Metformin, Avandament, Glucavance, please do not take 48 hours after completing test unless otherwise instructed.   Once we have confirmed authorization from your insurance company, we will call you to set up a date and time for your test.  For non-scheduling related questions, please contact the cardiac imaging nurse navigator should you have any questions/concerns: Marchia Bond, Cardiac Imaging Nurse Navigator Burley Saver, Interim Cardiac Imaging Nurse [facility] Direct Office Dial: [phone]   For scheduling needs, including cancellations and rescheduling, please call [phone].

## 2019-12-28 LAB — BASIC METABOLIC PANEL
BUN/Creatinine Ratio: 11 — ABNORMAL LOW (ref 12–28)
BUN: 10 mg/dL (ref 8–27)
CO2: 20 mmol/L (ref 20–29)
Calcium: 11.4 mg/dL — ABNORMAL HIGH (ref 8.7–10.3)
Chloride: 104 mmol/L (ref 96–106)
Creatinine, Ser: 0.94 mg/dL (ref 0.57–1.00)
GFR calc Af Amer: 71 mL/min/{1.73_m2} (ref >59)
GFR calc non Af Amer: 61 mL/min/{1.73_m2} (ref >59)
Glucose: 145 mg/dL — ABNORMAL HIGH (ref 65–99)
Potassium: 5.1 mmol/L (ref 3.5–5.2)
Sodium: 142 mmol/L (ref 134–144)

## 2019-12-28 LAB — CBC
Hematocrit: 38.5 % (ref 34.0–46.6)
Hemoglobin: 12.5 g/dL (ref 11.1–15.9)
MCH: 23.3 pg — ABNORMAL LOW (ref 26.6–33.0)
MCHC: 32.5 g/dL (ref 31.5–35.7)
MCV: 72 fL — ABNORMAL LOW (ref 79–97)
Platelets: 321 10*3/uL (ref 150–450)
RBC: 5.37 x10E6/uL — ABNORMAL HIGH (ref 3.77–5.28)
RDW: 14.7 % (ref 11.7–15.4)
WBC: 9.9 10*3/uL (ref 3.4–10.8)

## 2019-12-28 LAB — TSH: TSH: 1.87 u[IU]/mL (ref 0.450–4.500)

## 2019-12-28 LAB — BRAIN NATRIURETIC PEPTIDE: BNP: 33.4 pg/mL (ref 0.0–100.0)

## 2020-01-08 ENCOUNTER — Ambulatory Visit: Payer: Medicare Other | Admitting: Cardiology

## 2020-01-15 NOTE — Telephone Encounter (Signed)
Patient is wondering if the Pharm D visit is really necessary ----was her cholesterol really that high (pt was seen on 12/27/19 and was instructed to schedule a Pharm D visit to discuss PCSK9) and is the visit really necessary.

## 2020-01-15 NOTE — Telephone Encounter (Signed)
Returned call to patient, she is wondering if she really needs to see the pharmacist for PCSK9.     Advised per OV note: she has hx of PVD and high risk for CAD (CT scan 7/8) and unable to tolerate statin.  Per [facility], LDL 120.  Advised goal is <70 given her history.   She verbalized understanding.  She has CT 7/8 and follow up with Dr. Percival Spanish 7/9-advised to discuss further at appt and make appt with pharmD while she is in office.    Patient aware.

## 2020-01-24 ENCOUNTER — Telehealth (HOSPITAL_COMMUNITY): Payer: Self-pay | Admitting: *Deleted

## 2020-01-24 NOTE — Telephone Encounter (Signed)
Attempted to call patient regarding upcoming cardiac CT appointment. Left message on voicemail with name and callback number  Caide Campi Tai RN Navigator Cardiac Imaging [facility] [phone] Office [phone] Cell  

## 2020-01-24 NOTE — Telephone Encounter (Signed)
Pt returning call regarding upcoming cardiac imaging study; pt verbalizes understanding of appt date/time, parking situation and where to check in, pre-test NPO status and medications ordered, and verified current allergies; name and call back number provided for further questions should they arise  Hyman Crossan Tai RN Navigator Cardiac Imaging [facility] [phone] office [phone] cell  

## 2020-01-25 ENCOUNTER — Ambulatory Visit (HOSPITAL_COMMUNITY)
Admission: RE | Admit: 2020-01-25 | Discharge: 2020-01-25 | Disposition: A | Discharge status: Home / Self Care | Payer: Medicare Other | Source: Ambulatory Visit | Attending: Cardiology | Admitting: Cardiology

## 2020-01-25 ENCOUNTER — Encounter: Payer: Medicare Other | Admitting: *Deleted

## 2020-01-25 ENCOUNTER — Other Ambulatory Visit: Payer: Self-pay

## 2020-01-25 DIAGNOSIS — D62 Acute posthemorrhagic anemia: Secondary | ICD-10-CM | POA: Diagnosis not present

## 2020-01-25 DIAGNOSIS — I25110 Atherosclerotic heart disease of native coronary artery with unstable angina pectoris: Secondary | ICD-10-CM | POA: Diagnosis not present

## 2020-01-25 DIAGNOSIS — R0602 Shortness of breath: Secondary | ICD-10-CM | POA: Insufficient documentation

## 2020-01-25 DIAGNOSIS — I129 Hypertensive chronic kidney disease with stage 1 through stage 4 chronic kidney disease, or unspecified chronic kidney disease: Secondary | ICD-10-CM | POA: Diagnosis not present

## 2020-01-25 DIAGNOSIS — E1122 Type 2 diabetes mellitus with diabetic chronic kidney disease: Secondary | ICD-10-CM | POA: Diagnosis not present

## 2020-01-25 DIAGNOSIS — I200 Unstable angina: Secondary | ICD-10-CM | POA: Diagnosis not present

## 2020-01-25 DIAGNOSIS — I2510 Atherosclerotic heart disease of native coronary artery without angina pectoris: Secondary | ICD-10-CM | POA: Diagnosis not present

## 2020-01-25 DIAGNOSIS — E1151 Type 2 diabetes mellitus with diabetic peripheral angiopathy without gangrene: Secondary | ICD-10-CM | POA: Diagnosis not present

## 2020-01-25 DIAGNOSIS — R5383 Other fatigue: Secondary | ICD-10-CM | POA: Insufficient documentation

## 2020-01-25 DIAGNOSIS — Z006 Encounter for examination for normal comparison and control in clinical research program: Secondary | ICD-10-CM

## 2020-01-25 DIAGNOSIS — R072 Precordial pain: Secondary | ICD-10-CM | POA: Insufficient documentation

## 2020-01-25 DIAGNOSIS — Z20822 Contact with and (suspected) exposure to covid-19: Secondary | ICD-10-CM | POA: Diagnosis not present

## 2020-01-25 IMAGING — CT CT HEART MORP W/ CTA COR W/ SCORE W/ CA W/CM &/OR W/O CM
4 of 7 series · 8 of 20 positions shown, 9 images · IV contrast (APPLIED)
Comparison: None.
COMPARISON: None.

Addendum:
EXAM:
OVER-READ INTERPRETATION  CT CHEST

The following report is an over-read performed by radiologist Dr.
Pitai Kush [REDACTED] on 01/25/2020. This
over-read does not include interpretation of cardiac or coronary
anatomy or pathology. The coronary calcium score/coronary CTA
interpretation by the cardiologist is attached.
CLINICAL DATA: Chest pain
Cardiac CTA
MEDICATIONS:
Sub lingual nitro. 4mg x 2
TECHNIQUE: The patient was scanned on a Siemens [REDACTED]ice scanner. Gantry
rotation speed was 250 msecs. Collimation was 0.6 mm. A 100 kV
prospective scan was triggered in the ascending thoracic aorta at
35-75% of the R-R interval. Average HR during the scan was 60 bpm.
The 3D data set was interpreted on a dedicated work station using
MPR, MIP and VRT modes. A total of 80cc of contrast was used.

[Series 6: best diast 75 % · axial · 0.39mm/px · z∈[-123,-85]mm · 2 of 288 slices shown]
[im 96/288  vessel]
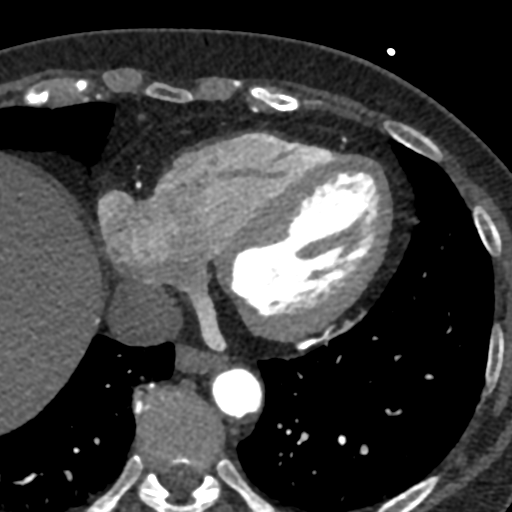
[im 192/288  vessel]
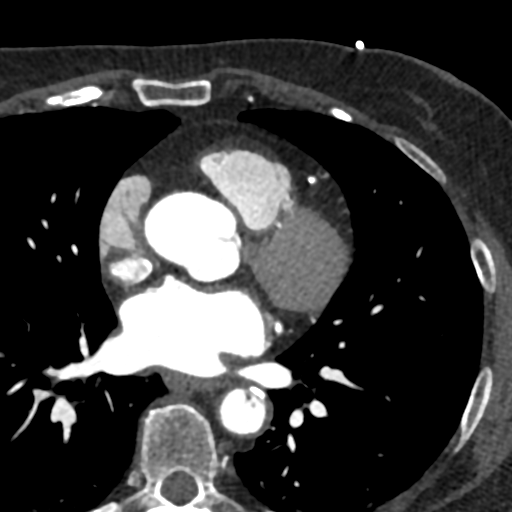

[Series 9: best syst · axial · 0.39mm/px · z∈[-123,-85]mm · 2 of 288 slices shown, 3 images]
[im 96/288  vessel]
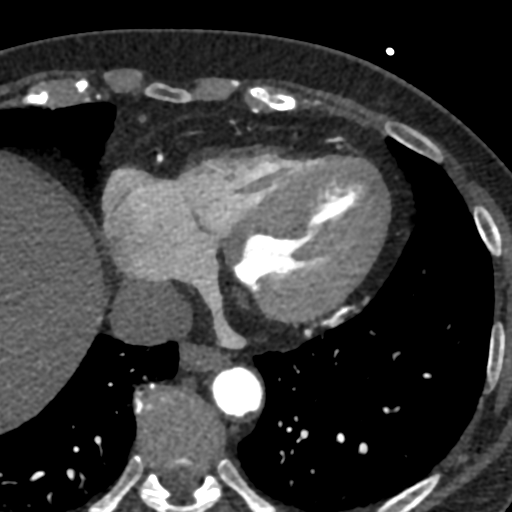
[im 96/288  lung]
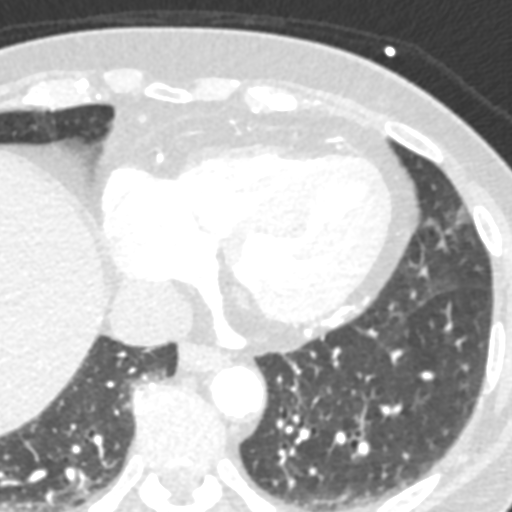
[im 192/288  vessel]
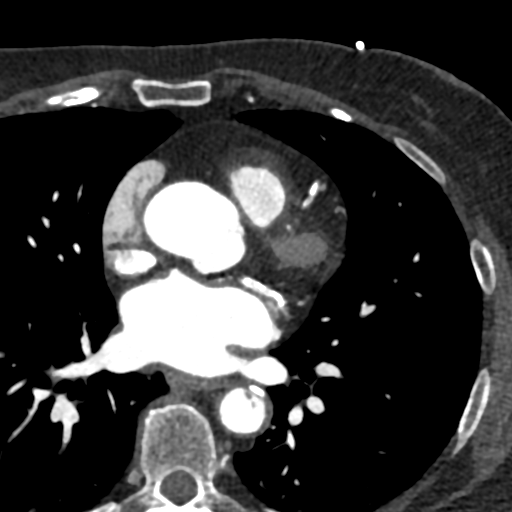

[Series 10: ts diast sharp 75 % · axial · 0.39mm/px · z∈[-123,-85]mm · 2 of 288 slices shown]
[im 96/288  lung]
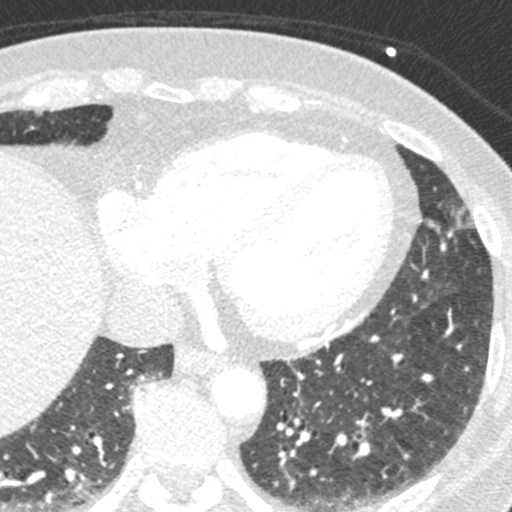
[im 192/288  lung]
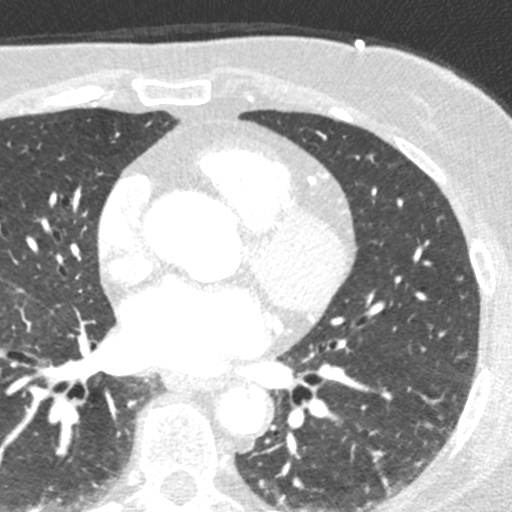

[Series 11: ts syst sharp · axial · 0.39mm/px · z∈[-123,-85]mm · 2 of 288 slices shown]
[im 96/288  lung]
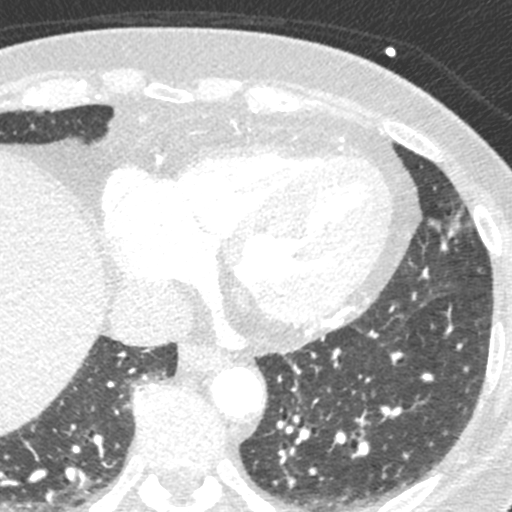
[im 192/288  lung]
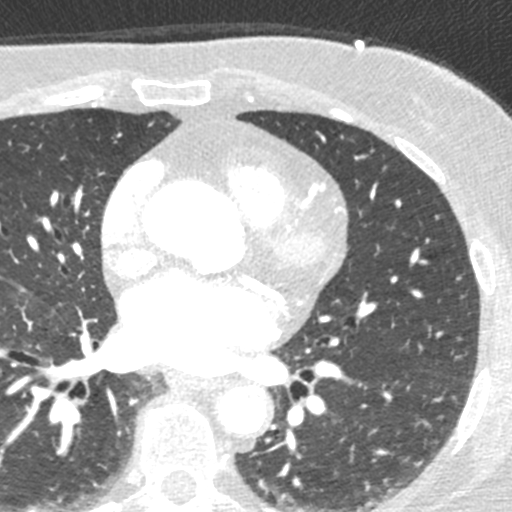

[8 of 20 positions shown; findings below may reference images not displayed]

FINDINGS: Aortic atherosclerosis. Within the visualized portions of the thorax
there are no suspicious appearing pulmonary nodules or masses, there
is no acute consolidative airspace disease, no pleural effusions, no
pneumothorax and no lymphadenopathy. Visualized portions of the
upper abdomen are unremarkable. There are no aggressive appearing
lytic or blastic lesions noted in the visualized portions of the
skeleton. Postoperative changes of lumpectomy noted in the left
breast.
IMPRESSION: 1.  Aortic Atherosclerosis (PTDE4-3TU.U).
FINDINGS: Non-cardiac: See separate report from [REDACTED].

Pulmonary veins drain normally to the left atrium.

Calcium Score: 6353 Agatston units.

Coronary Arteries: Right dominant with no anomalies

LM: Calcified plaque with no significant stenosis.

LAD system: Extensive ostial/proximal/mid LAD mixed plaque. Possible
severe (70-90%) stenosis in the ostial/proximal LAD.

Circumflex system: Extensive proximal/mid LCx mixed plaque. Possible
severe (70-90%) stenosis in the proximal LCx.

RCA system: Extensive mixed plaque in the proximal to mid RCA.
Possible severe (70-90%) stenosis.
IMPRESSION: 1. Coronary artery calcium score 6353 Agatston units. This places
the patient in the 98th percentile for age and gender, suggesting
high risk for future cardiac events.

2. There is extensive proximal to mid coronary plaque in all three
major vessels. I am concerned for possible severe stenosis in each
vessel. However, given possibility for blooming artifact with
copious calcification, will need to send for FFR to confirm.

Blade Aujla

*** End of Addendum ***
EXAM:
OVER-READ INTERPRETATION  CT CHEST

The following report is an over-read performed by radiologist Dr.
Pitai Kush [REDACTED] on 01/25/2020. This
over-read does not include interpretation of cardiac or coronary
anatomy or pathology. The coronary calcium score/coronary CTA
interpretation by the cardiologist is attached.
FINDINGS: Aortic atherosclerosis. Within the visualized portions of the thorax
there are no suspicious appearing pulmonary nodules or masses, there
is no acute consolidative airspace disease, no pleural effusions, no
pneumothorax and no lymphadenopathy. Visualized portions of the
upper abdomen are unremarkable. There are no aggressive appearing
lytic or blastic lesions noted in the visualized portions of the
skeleton. Postoperative changes of lumpectomy noted in the left
breast.
IMPRESSION: 1.  Aortic Atherosclerosis (PTDE4-3TU.U).

## 2020-01-25 MED ORDER — METOPROLOL TARTRATE 5 MG/5ML IV SOLN
INTRAVENOUS | Status: AC
  Filled 2020-01-25: qty 5

## 2020-01-25 MED ORDER — NITROGLYCERIN 0.4 MG SL SUBL
SUBLINGUAL_TABLET | SUBLINGUAL | Status: AC
  Filled 2020-01-25: qty 2

## 2020-01-25 MED ORDER — METOPROLOL TARTRATE 5 MG/5ML IV SOLN
5.0000 mg | INTRAVENOUS | Status: DC | PRN
  Administered 2020-01-25: 5 mg via INTRAVENOUS

## 2020-01-25 MED ORDER — IOHEXOL 350 MG/ML SOLN
80.0000 mL | Freq: Once | INTRAVENOUS | Status: AC | PRN
  Administered 2020-01-25: 80 mL via INTRAVENOUS

## 2020-01-25 MED ORDER — NITROGLYCERIN 0.4 MG SL SUBL
0.8000 mg | SUBLINGUAL_TABLET | Freq: Once | SUBLINGUAL | Status: AC
Start: 2020-01-25 — End: 2020-01-25
  Administered 2020-01-25: 0.8 mg via SUBLINGUAL

## 2020-01-25 NOTE — Research (Signed)
CADFEM Informed Consent                  Subject Name:   Faith Gaines  Subject met inclusion and exclusion criteria.  The informed consent form, study requirements and expectations were reviewed with the subject and questions and concerns were addressed prior to the signing of the consent form.  The subject verbalized understanding of the trial requirements.  The subject agreed to participate in the CADFEM trial and signed the informed consent.  The informed consent was obtained prior to performance of any protocol-specific procedures for the subject.  A copy of the signed informed consent was given to the subject and a copy was placed in the subject's medical record.   [place] Tarren Sabree, Research Assistant  01/25/2020  11:20 a.m.

## 2020-01-25 NOTE — Progress Notes (Signed)
Cardiology Office Note   Date:  01/26/2020   ID:  Faith, Gaines [birth year], MRN [id number]  PCP:  Maury Dus, MD  Cardiologist:   No primary care provider on file. Referring:  Maury Dus, MD  Chief Complaint  Patient presents with  . Chest Pain      History of Present Illness: Faith Gaines is a 74 y.o. female who is referred for evaluation of chest pain.  I sent her for a CT which was done yesterday.  This demonstrated severe three vessel disease.  This was done yesterday.   This demonstrated extensive ostial proximal mid LAD, RCA and circumflex plaque.    She says all of her symptoms started after the first dose of the month during her vaccine in late April.  She has lots of somatic complaints.  Has had some progressive shortness of breath.  She has been fatigued.  She had some mild lower extremity swelling.  She has chest discomfort with activity.  This is happening with mild activity and probably progressive in the last month.  She has had some today.  She has had tingling and friction needles in her arms and legs.  Her arms also get discomfort with activity.  She is comfortable if she is doing nothing but she thinks her symptoms are coming on with less activity.  She is not having any PND or orthopnea.  She is not having any palpitations, presyncope or syncope.  She had no weight gain or edema.  She was having chest pain in the office today.    Past Medical History:  Diagnosis Date  . Anxiety    no meds  . Breast cancer ([place]) 2015   left breast   . CKD (chronic kidney disease), stage III    no meds, blood draws q 62mos by Dr. Alyson Ingles in [place]  . Depression    no meds  . Diabetes mellitus without complication ([place])    type 2  . GERD (gastroesophageal reflux disease)    occ  . Hyperlipidemia    diet controlled, no meds  . Hypertension   . Iron deficiency anemia   . Osteopenia   . Peripheral vascular disease ([place])    rt carotid stenosis   . Pneumonia 12/2011, 05/2012   hx x2 in 2013  . PONV (postoperative nausea and vomiting)   . Trigger finger    resolved with cortisone injections    Past Surgical History:  Procedure Laterality Date  . ANTERIOR AND POSTERIOR REPAIR N/A 10/22/2014   Procedure: CYSTO, REPAIR CYSTOCELE/RECTOCELE;  Surgeon: Bjorn Loser, MD;  Location: [facility];  Service: Urology;  Laterality: N/A;  . APPENDECTOMY    . BLADDER SURGERY  05/1981   bladder tack  . BREAST LUMPECTOMY Left 2015  . CHOLECYSTECTOMY  1995  . COLONOSCOPY    . ENDARTERECTOMY Right 06/29/2013   Procedure: ENDARTERECTOMY CAROTID-RIGHT, WITH DACRON PATCH ANGIOPLASTY;  Surgeon: Mal Misty, MD;  Location: [facility];  Service: Vascular;  Laterality: Right;  . LAPAROSCOPIC ASSISTED VAGINAL HYSTERECTOMY N/A 10/22/2014   Procedure: LAPAROSCOPIC ASSISTED VAGINAL HYSTERECTOMY;  Surgeon: Marylynn Pearson, MD;  Location: [facility];  Service: Gynecology;  Laterality: N/A;  Pam from Dr. Mikle Bosworth office will call and add additional surgery for this patient in our block time.  Marland Kitchen MELANOMA EXCISION  12/1980   left leg,  . PARTIAL MASTECTOMY WITH NEEDLE LOCALIZATION Left 08/22/2013   Procedure: PARTIAL MASTECTOMY WITH NEEDLE LOCALIZATION;  Surgeon: Stark Klein, MD;  Location: [facility];  Service: General;  Laterality: Left;  . SALPINGOOPHORECTOMY Bilateral 10/22/2014   Procedure: SALPINGO OOPHORECTOMY;  Surgeon: Marylynn Pearson, MD;  Location: [facility];  Service: Gynecology;  Laterality: Bilateral;  . 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 TUBAL LIGATION    . VAGINAL PROLAPSE REPAIR N/A 10/22/2014   Procedure: VAULT PROLAPSE WITH GRAFT;  Surgeon: Bjorn Loser, MD;  Location: [facility];  Service: Urology;  Laterality: N/A;  . WISDOM TOOTH EXTRACTION       Current Outpatient Medications  Medication Sig Dispense Refill  . aspirin EC 81 MG tablet Take 81 mg by mouth daily.    . Canagliflozin (INVOKANA) 300 MG TABS Take 300 mg by mouth daily.       . nebivolol (BYSTOLIC) 5 MG tablet Take 5 mg by mouth daily.    . Nebivolol HCl (BYSTOLIC) 20 MG TABS Take 1 tablet by mouth daily.    . Omega-3 Fatty Acids (OMEGA-3 2100) 1050 MG CAPS Take by mouth daily.    . pantoprazole (PROTONIX) 40 MG tablet Take 40 mg by mouth daily.    . Saxagliptin-Metformin (KOMBIGLYZE XR) 2.11-998 MG TB24 Take 1 tablet by mouth 2 (two) times daily. Reported on 08/21/2015    . VITAMIN D PO Take 1,000 Units by mouth daily.     No current facility-administered medications for this visit.    Allergies:   Codeine, Ace inhibitors, Clarithromycin, Crestor [rosuvastatin], Lipitor [atorvastatin], and Avandamet [rosiglitazone-metformin]   Family History  Problem Relation Age of Onset  . Cancer Mother        breast, uterus, lung  . Heart attack Mother 77  . Stroke Mother   . Heart disease Mother        Open Heart Surgery  . Hypertension Mother   . Varicose Veins Mother   . Heart disease Father   . Heart attack Father 24       Died of MI age 72  . Kidney disease Father   . Hypertension Father   . Diabetes Father   . Cancer Father   . Asthma Father   . Heart attack Sister 79       ICD  . Heart disease Sister   . Healthy Daughter   . Healthy Daughter   . Healthy Son     Social History   Socioeconomic History  . Marital status: Widowed    Spouse name: Not on file  . Number of children: Not on file  . Years of education: Not on file  . 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 Smoking status: Former Smoker    Packs/day: 0.25    Years: 3.00    Pack years: 0.75    Types: Cigarettes    Quit date: 07/20/2010    Years since quitting: 9.5  . Smokeless tobacco: Never Used  Vaping Use  . Vaping Use: Never used  Substance and Sexual Activity  . Alcohol use: No    Alcohol/week: 0.0 standard drinks  . Drug use: No  . Sexual activity: Yes    Birth  control/protection: Post-menopausal  Other Topics Concern  . Not on file  Social History Narrative   Widow.  Lives alone.  Three children and 9 grands.     Social Determinants of Health   Financial Resource Strain:   . Difficulty of Paying  Living Expenses:   Food Insecurity:   . Worried About Charity fundraiser in the Last Year:   . Arboriculturist in the Last Year:   Transportation Needs:   . Film/video editor (Medical):   Marland Kitchen Lack of Transportation (Non-Medical):   Physical Activity:   . Days of Exercise per Week:   . Minutes of Exercise per Session:   Stress:   . Feeling of Stress :   Social Connections:   . Frequency of Communication with Friends and Family:   . Frequency of Social Gatherings with Friends and Family:   . Attends Religious Services:   . Active Member of Clubs or Organizations:   . Attends Archivist Meetings:   Marland Kitchen Marital Status:   Intimate Partner Violence:   . Fear of Current or Ex-Partner:   . Emotionally Abused:   Marland Kitchen Physically Abused:   . Sexually Abused:     ROS:  Please see the history of present illness.   Otherwise, review of systems are positive for none.   All other systems are reviewed and negative.    PHYSICAL EXAM: VS:  BP (!) 154/73   Pulse 70   Temp (!) 96.6 F (35.9 C)   Ht 5\' 1"  (1.549 m)   Wt 119 lb 9.6 oz (54.3 kg)   SpO2 99%   BMI 22.60 kg/m  , BMI Body mass index is 22.6 kg/m. GENERAL:  Well appearing HEENT:  Pupils equal round and reactive, fundi not visualized, oral mucosa unremarkable NECK:  No jugular venous distention, waveform within normal limits, carotid upstroke brisk and symmetric, no bruits, no thyromegaly LYMPHATICS:  No cervical, inguinal adenopathy LUNGS:  Clear to auscultation bilaterally BACK:  No CVA tenderness CHEST:  Unremarkable HEART:  PMI not displaced or sustained,S1 and S2 within normal limits, no S3, no S4, no clicks, no rubs, no murmurs ABD:  Flat, positive bowel sounds normal in  frequency in pitch, no bruits, no rebound, no guarding, no midline pulsatile mass, no hepatomegaly, no splenomegaly EXT:  2 plus pulses throughout, no edema, no cyanosis no clubbing SKIN:  No rashes no nodules NEURO:  Cranial nerves II through XII grossly intact, motor grossly intact throughout PSYCH:  Cognitively intact, oriented to person place and time   EKG:  EKG is  ordered today. The ekg ordered today demonstrates sinus rhythm, rate 72, axis within normal limits, intervals within normal limits, no acute ST-T wave changes.   Recent Labs: 12/27/2019: BNP 33.4; BUN 10; Creatinine, Ser 0.94; Hemoglobin 12.5; Platelets 321; Potassium 5.1; Sodium 142; TSH 1.870    Lipid Panel No results found for: CHOL, TRIG, HDL, CHOLHDL, VLDL, LDLCALC, LDLDIRECT    Wt Readings from Last 3 Encounters:  01/26/20 119 lb 9.6 oz (54.3 kg)  12/27/19 118 lb (53.5 kg)  01/30/19 119 lb 3.2 oz (54.1 kg)      Other studies Reviewed: Additional studies/ records that were reviewed today include: CT. Review of the above records demonstrates:  Please see elsewhere in the note.     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  She has unstable symptoms.  She needs cardiac cath and I would do this urgently today given the severity of her symptoms and the severity of findings on CT.  The patient understands that risks included but are not limited to stroke (1 in 1000), death (1 in 78), kidney failure [usually temporary] (1 in 500),  bleeding (1 in 200), allergic reaction [possibly serious] (1 in 200).  The patient understands and agrees to proceed.   SOB: This will be evaluated as above.  She had normal oxygen saturations with ambulation in the office last time.   BMP was unremarkable.  FATIGUE: She had a normal TSH in January.  She was not anemic.  This may be an anginal equivalent as well.   PVD: She has had carotid endarterectomy.  DM: A1c was 7.6.  Continue current care and  defer to her primary provider for med adjustments.  She would be a good candidate for SGLT2 inhibitor for GLP-1 receptor antagonist.    DYSLIPIDEMIA:   She has been referred to our lipid clinic for PCSK9 inhibitor given the fact that she is intolerant of all but the lowest dose of statin.   COVID EDUCATION: She has had her vaccine as above.   Current medicines are reviewed at length with the patient today.  The patient does not have concerns regarding medicines.  The following changes have been made:  As above  Labs/ tests ordered today include:   Orders Placed This Encounter  Procedures  . EKG 12-Lead     Disposition:   FU with me after the above testing.     Signed, Minus Breeding, MD  01/26/2020 12:51 PM    [facility]

## 2020-01-25 NOTE — H&P (View-Only) (Signed)
Cardiology Office Note   Date:  01/26/2020   ID:  Faith Gaines, Faith Gaines 09-24-1945, MRN [id number]  PCP:  Maury Dus, MD  Cardiologist:   No primary care provider on file. Referring:  Maury Dus, MD  Chief Complaint  Patient presents with  . Chest Pain      History of Present Illness: Faith Gaines is a 74 y.o. female who is referred for evaluation of chest pain.  I sent her for a CT which was done yesterday.  This demonstrated severe three vessel disease.  This was done yesterday.   This demonstrated extensive ostial proximal mid LAD, RCA and circumflex plaque.    She says all of her symptoms started after the first dose of the month during her vaccine in late April.  She has lots of somatic complaints.  Has had some progressive shortness of breath.  She has been fatigued.  She had some mild lower extremity swelling.  She has chest discomfort with activity.  This is happening with mild activity and probably progressive in the last month.  She has had some today.  She has had tingling and friction needles in her arms and legs.  Her arms also get discomfort with activity.  She is comfortable if she is doing nothing but she thinks her symptoms are coming on with less activity.  She is not having any PND or orthopnea.  She is not having any palpitations, presyncope or syncope.  She had no weight gain or edema.  She was having chest pain in the office today.    Past Medical History:  Diagnosis Date  . Anxiety    no meds  . Breast cancer ([place]) 2015   left breast   . CKD (chronic kidney disease), stage III    no meds, blood draws q 48mos by Dr. Alyson Ingles in [place]  . Depression    no meds  . Diabetes mellitus without complication ([place])    type 2  . GERD (gastroesophageal reflux disease)    occ  . Hyperlipidemia    diet controlled, no meds  . Hypertension   . Iron deficiency anemia   . Osteopenia   . Peripheral vascular disease ([place])    rt carotid stenosis   . Pneumonia 12/2011, 05/2012   hx x2 in 2013  . PONV (postoperative nausea and vomiting)   . Trigger finger    resolved with cortisone injections    Past Surgical History:  Procedure Laterality Date  . ANTERIOR AND POSTERIOR REPAIR N/A 10/22/2014   Procedure: CYSTO, REPAIR CYSTOCELE/RECTOCELE;  Surgeon: Bjorn Loser, MD;  Location: [facility];  Service: Urology;  Laterality: N/A;  . APPENDECTOMY    . BLADDER SURGERY  05/1981   bladder tack  . BREAST LUMPECTOMY Left 2015  . CHOLECYSTECTOMY  1995  . COLONOSCOPY    . ENDARTERECTOMY Right 06/29/2013   Procedure: ENDARTERECTOMY CAROTID-RIGHT, WITH DACRON PATCH ANGIOPLASTY;  Surgeon: Mal Misty, MD;  Location: [facility];  Service: Vascular;  Laterality: Right;  . LAPAROSCOPIC ASSISTED VAGINAL HYSTERECTOMY N/A 10/22/2014   Procedure: LAPAROSCOPIC ASSISTED VAGINAL HYSTERECTOMY;  Surgeon: Marylynn Pearson, MD;  Location: [facility];  Service: Gynecology;  Laterality: N/A;  Pam from Dr. Mikle Bosworth office will call and add additional surgery for this patient in our block time.  Marland Kitchen MELANOMA EXCISION  12/1980   left leg,  . PARTIAL MASTECTOMY WITH NEEDLE LOCALIZATION Left 08/22/2013   Procedure: PARTIAL MASTECTOMY WITH NEEDLE LOCALIZATION;  Surgeon: Stark Klein, MD;  Location[facility];  Service: General;  Laterality: Left;  . SALPINGOOPHORECTOMY Bilateral 10/22/2014   Procedure: SALPINGO OOPHORECTOMY;  Surgeon: Marylynn Pearson, MD;  Location: [facility];  Service: Gynecology;  Laterality: Bilateral;  . 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 TUBAL LIGATION    . VAGINAL PROLAPSE REPAIR N/A 10/22/2014   Procedure: VAULT PROLAPSE WITH GRAFT;  Surgeon: Bjorn Loser, MD;  Location: [facility];  Service: Urology;  Laterality: N/A;  . WISDOM TOOTH EXTRACTION       Current Outpatient Medications  Medication Sig Dispense Refill  . aspirin EC 81 MG tablet Take 81 mg by mouth daily.    . Canagliflozin (INVOKANA) 300 MG TABS Take 300 mg by mouth daily.       . nebivolol (BYSTOLIC) 5 MG tablet Take 5 mg by mouth daily.    . Nebivolol HCl (BYSTOLIC) 20 MG TABS Take 1 tablet by mouth daily.    . Omega-3 Fatty Acids (OMEGA-3 2100) 1050 MG CAPS Take by mouth daily.    . pantoprazole (PROTONIX) 40 MG tablet Take 40 mg by mouth daily.    . Saxagliptin-Metformin (KOMBIGLYZE XR) 2.11-998 MG TB24 Take 1 tablet by mouth 2 (two) times daily. Reported on 08/21/2015    . VITAMIN D PO Take 1,000 Units by mouth daily.     No current facility-administered medications for this visit.    Allergies:   Codeine, Ace inhibitors, Clarithromycin, Crestor [rosuvastatin], Lipitor [atorvastatin], and Avandamet [rosiglitazone-metformin]   Family History  Problem Relation Age of Onset  . Cancer Mother        breast, uterus, lung  . Heart attack Mother 1  . Stroke Mother   . Heart disease Mother        Open Heart Surgery  . Hypertension Mother   . Varicose Veins Mother   . Heart disease Father   . Heart attack Father 89       Died of MI age 61  . Kidney disease Father   . Hypertension Father   . Diabetes Father   . Cancer Father   . Asthma Father   . Heart attack Sister 12       ICD  . Heart disease Sister   . Healthy Daughter   . Healthy Daughter   . Healthy Son     Social History   Socioeconomic History  . Marital status: Widowed    Spouse name: Not on file  . Number of children: Not on file  . Years of education: Not on file  . 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 Smoking status: Former Smoker    Packs/day: 0.25    Years: 3.00    Pack years: 0.75    Types: Cigarettes    Quit date: 07/20/2010    Years since quitting: 9.5  . Smokeless tobacco: Never Used  Vaping Use  . Vaping Use: Never used  Substance and Sexual Activity  . Alcohol use: No    Alcohol/week: 0.0 standard drinks  . Drug use: No  . Sexual activity: Yes    Birth  control/protection: Post-menopausal  Other Topics Concern  . Not on file  Social History Narrative   Widow.  Lives alone.  Three children and 9 grands.     Social Determinants of Health   Financial Resource Strain:   . Difficulty of Paying  Living Expenses:   Food Insecurity:   . Worried About Charity fundraiser in the Last Year:   . Arboriculturist in the Last Year:   Transportation Needs:   . Film/video editor (Medical):   Marland Kitchen Lack of Transportation (Non-Medical):   Physical Activity:   . Days of Exercise per Week:   . Minutes of Exercise per Session:   Stress:   . Feeling of Stress :   Social Connections:   . Frequency of Communication with Friends and Family:   . Frequency of Social Gatherings with Friends and Family:   . Attends Religious Services:   . Active Member of Clubs or Organizations:   . Attends Archivist Meetings:   Marland Kitchen Marital Status:   Intimate Partner Violence:   . Fear of Current or Ex-Partner:   . Emotionally Abused:   Marland Kitchen Physically Abused:   . Sexually Abused:     ROS:  Please see the history of present illness.   Otherwise, review of systems are positive for none.   All other systems are reviewed and negative.    PHYSICAL EXAM: VS:  BP (!) 154/73   Pulse 70   Temp (!) 96.6 F (35.9 C)   Ht 5\' 1"  (1.549 m)   Wt 119 lb 9.6 oz (54.3 kg)   SpO2 99%   BMI 22.60 kg/m  , BMI Body mass index is 22.6 kg/m. GENERAL:  Well appearing HEENT:  Pupils equal round and reactive, fundi not visualized, oral mucosa unremarkable NECK:  No jugular venous distention, waveform within normal limits, carotid upstroke brisk and symmetric, no bruits, no thyromegaly LYMPHATICS:  No cervical, inguinal adenopathy LUNGS:  Clear to auscultation bilaterally BACK:  No CVA tenderness CHEST:  Unremarkable HEART:  PMI not displaced or sustained,S1 and S2 within normal limits, no S3, no S4, no clicks, no rubs, no murmurs ABD:  Flat, positive bowel sounds normal in  frequency in pitch, no bruits, no rebound, no guarding, no midline pulsatile mass, no hepatomegaly, no splenomegaly EXT:  2 plus pulses throughout, no edema, no cyanosis no clubbing SKIN:  No rashes no nodules NEURO:  Cranial nerves II through XII grossly intact, motor grossly intact throughout PSYCH:  Cognitively intact, oriented to person place and time   EKG:  EKG is  ordered today. The ekg ordered today demonstrates sinus rhythm, rate 72, axis within normal limits, intervals within normal limits, no acute ST-T wave changes.   Recent Labs: 12/27/2019: BNP 33.4; BUN 10; Creatinine, Ser 0.94; Hemoglobin 12.5; Platelets 321; Potassium 5.1; Sodium 142; TSH 1.870    Lipid Panel No results found for: CHOL, TRIG, HDL, CHOLHDL, VLDL, LDLCALC, LDLDIRECT    Wt Readings from Last 3 Encounters:  01/26/20 119 lb 9.6 oz (54.3 kg)  12/27/19 118 lb (53.5 kg)  01/30/19 119 lb 3.2 oz (54.1 kg)      Other studies Reviewed: Additional studies/ records that were reviewed today include: CT. Review of the above records demonstrates:  Please see elsewhere in the note.     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  She has unstable symptoms.  She needs cardiac cath and I would do this urgently today given the severity of her symptoms and the severity of findings on CT.  The patient understands that risks included but are not limited to stroke (1 in 1000), death (1 in 80), kidney failure [usually temporary] (1 in 500),  bleeding (1 in 200), allergic reaction [possibly serious] (1 in 200).  The patient understands and agrees to proceed.   SOB: This will be evaluated as above.  She had normal oxygen saturations with ambulation in the office last time.   BMP was unremarkable.  FATIGUE: She had a normal TSH in January.  She was not anemic.  This may be an anginal equivalent as well.   PVD: She has had carotid endarterectomy.  DM: A1c was 7.6.  Continue current care and  defer to her primary provider for med adjustments.  She would be a good candidate for SGLT2 inhibitor for GLP-1 receptor antagonist.    DYSLIPIDEMIA:   She has been referred to our lipid clinic for PCSK9 inhibitor given the fact that she is intolerant of all but the lowest dose of statin.   COVID EDUCATION: She has had her vaccine as above.   Current medicines are reviewed at length with the patient today.  The patient does not have concerns regarding medicines.  The following changes have been made:  As above  Labs/ tests ordered today include:   Orders Placed This Encounter  Procedures  . EKG 12-Lead     Disposition:   FU with me after the above testing.     Signed, Minus Breeding, MD  01/26/2020 12:51 PM    [facility]

## 2020-01-26 ENCOUNTER — Encounter (HOSPITAL_COMMUNITY)
Admission: EM | Disposition: A | Discharge status: Home / Self Care | Payer: Self-pay | Source: Home / Self Care | Attending: Internal Medicine

## 2020-01-26 ENCOUNTER — Other Ambulatory Visit: Payer: Self-pay

## 2020-01-26 ENCOUNTER — Encounter: Payer: Self-pay | Admitting: Cardiology

## 2020-01-26 ENCOUNTER — Ambulatory Visit (INDEPENDENT_AMBULATORY_CARE_PROVIDER_SITE_OTHER): Payer: Medicare Other | Admitting: Cardiology

## 2020-01-26 ENCOUNTER — Encounter (HOSPITAL_COMMUNITY): Payer: Self-pay | Admitting: Emergency Medicine

## 2020-01-26 ENCOUNTER — Inpatient Hospital Stay (HOSPITAL_COMMUNITY)
Admission: EM | Admit: 2020-01-26 | Discharge: 2020-02-05 | DRG: 234 | Disposition: A | Discharge status: Home / Self Care | Payer: Medicare Other | Attending: Thoracic Surgery (Cardiothoracic Vascular Surgery) | Admitting: Thoracic Surgery (Cardiothoracic Vascular Surgery)

## 2020-01-26 VITALS — BP 154/73 | HR 70 | Temp 96.6°F | Ht 61.0 in | Wt 119.6 lb

## 2020-01-26 DIAGNOSIS — E1151 Type 2 diabetes mellitus with diabetic peripheral angiopathy without gangrene: Secondary | ICD-10-CM | POA: Diagnosis present

## 2020-01-26 DIAGNOSIS — R0789 Other chest pain: Secondary | ICD-10-CM | POA: Diagnosis not present

## 2020-01-26 DIAGNOSIS — Z6822 Body mass index (BMI) 22.0-22.9, adult: Secondary | ICD-10-CM | POA: Diagnosis not present

## 2020-01-26 DIAGNOSIS — Z20822 Contact with and (suspected) exposure to covid-19: Secondary | ICD-10-CM | POA: Diagnosis present

## 2020-01-26 DIAGNOSIS — T466X5A Adverse effect of antihyperlipidemic and antiarteriosclerotic drugs, initial encounter: Secondary | ICD-10-CM | POA: Diagnosis not present

## 2020-01-26 DIAGNOSIS — M19041 Primary osteoarthritis, right hand: Secondary | ICD-10-CM | POA: Diagnosis present

## 2020-01-26 DIAGNOSIS — I200 Unstable angina: Secondary | ICD-10-CM

## 2020-01-26 DIAGNOSIS — Z7982 Long term (current) use of aspirin: Secondary | ICD-10-CM

## 2020-01-26 DIAGNOSIS — Z85820 Personal history of malignant melanoma of skin: Secondary | ICD-10-CM | POA: Diagnosis not present

## 2020-01-26 DIAGNOSIS — Z801 Family history of malignant neoplasm of trachea, bronchus and lung: Secondary | ICD-10-CM

## 2020-01-26 DIAGNOSIS — Z85828 Personal history of other malignant neoplasm of skin: Secondary | ICD-10-CM | POA: Diagnosis not present

## 2020-01-26 DIAGNOSIS — E785 Hyperlipidemia, unspecified: Secondary | ICD-10-CM | POA: Diagnosis present

## 2020-01-26 DIAGNOSIS — I129 Hypertensive chronic kidney disease with stage 1 through stage 4 chronic kidney disease, or unspecified chronic kidney disease: Secondary | ICD-10-CM | POA: Diagnosis not present

## 2020-01-26 DIAGNOSIS — R918 Other nonspecific abnormal finding of lung field: Secondary | ICD-10-CM | POA: Diagnosis not present

## 2020-01-26 DIAGNOSIS — Z87891 Personal history of nicotine dependence: Secondary | ICD-10-CM | POA: Diagnosis not present

## 2020-01-26 DIAGNOSIS — Z01811 Encounter for preprocedural respiratory examination: Secondary | ICD-10-CM

## 2020-01-26 DIAGNOSIS — R0602 Shortness of breath: Secondary | ICD-10-CM

## 2020-01-26 DIAGNOSIS — M8580 Other specified disorders of bone density and structure, unspecified site: Secondary | ICD-10-CM | POA: Diagnosis present

## 2020-01-26 DIAGNOSIS — K219 Gastro-esophageal reflux disease without esophagitis: Secondary | ICD-10-CM | POA: Diagnosis present

## 2020-01-26 DIAGNOSIS — Z881 Allergy status to other antibiotic agents status: Secondary | ICD-10-CM

## 2020-01-26 DIAGNOSIS — Z823 Family history of stroke: Secondary | ICD-10-CM

## 2020-01-26 DIAGNOSIS — Z789 Other specified health status: Secondary | ICD-10-CM | POA: Diagnosis not present

## 2020-01-26 DIAGNOSIS — M609 Myositis, unspecified: Secondary | ICD-10-CM | POA: Diagnosis not present

## 2020-01-26 DIAGNOSIS — Z01818 Encounter for other preprocedural examination: Secondary | ICD-10-CM | POA: Diagnosis not present

## 2020-01-26 DIAGNOSIS — R931 Abnormal findings on diagnostic imaging of heart and coronary circulation: Secondary | ICD-10-CM | POA: Diagnosis not present

## 2020-01-26 DIAGNOSIS — Z853 Personal history of malignant neoplasm of breast: Secondary | ICD-10-CM | POA: Diagnosis not present

## 2020-01-26 DIAGNOSIS — Z79899 Other long term (current) drug therapy: Secondary | ICD-10-CM

## 2020-01-26 DIAGNOSIS — Z803 Family history of malignant neoplasm of breast: Secondary | ICD-10-CM | POA: Diagnosis not present

## 2020-01-26 DIAGNOSIS — J984 Other disorders of lung: Secondary | ICD-10-CM | POA: Diagnosis not present

## 2020-01-26 DIAGNOSIS — I2510 Atherosclerotic heart disease of native coronary artery without angina pectoris: Secondary | ICD-10-CM | POA: Diagnosis not present

## 2020-01-26 DIAGNOSIS — R5383 Other fatigue: Secondary | ICD-10-CM

## 2020-01-26 DIAGNOSIS — I10 Essential (primary) hypertension: Secondary | ICD-10-CM | POA: Diagnosis not present

## 2020-01-26 DIAGNOSIS — R0902 Hypoxemia: Secondary | ICD-10-CM | POA: Diagnosis not present

## 2020-01-26 DIAGNOSIS — Z808 Family history of malignant neoplasm of other organs or systems: Secondary | ICD-10-CM | POA: Diagnosis not present

## 2020-01-26 DIAGNOSIS — Z951 Presence of aortocoronary bypass graft: Secondary | ICD-10-CM

## 2020-01-26 DIAGNOSIS — Z888 Allergy status to other drugs, medicaments and biological substances status: Secondary | ICD-10-CM

## 2020-01-26 DIAGNOSIS — J90 Pleural effusion, not elsewhere classified: Secondary | ICD-10-CM | POA: Diagnosis not present

## 2020-01-26 DIAGNOSIS — Z885 Allergy status to narcotic agent status: Secondary | ICD-10-CM | POA: Diagnosis not present

## 2020-01-26 DIAGNOSIS — M19042 Primary osteoarthritis, left hand: Secondary | ICD-10-CM | POA: Diagnosis present

## 2020-01-26 DIAGNOSIS — J9811 Atelectasis: Secondary | ICD-10-CM | POA: Diagnosis not present

## 2020-01-26 DIAGNOSIS — Z01810 Encounter for preprocedural cardiovascular examination: Secondary | ICD-10-CM

## 2020-01-26 DIAGNOSIS — Z9889 Other specified postprocedural states: Secondary | ICD-10-CM

## 2020-01-26 DIAGNOSIS — I25119 Atherosclerotic heart disease of native coronary artery with unspecified angina pectoris: Secondary | ICD-10-CM | POA: Diagnosis not present

## 2020-01-26 DIAGNOSIS — R079 Chest pain, unspecified: Secondary | ICD-10-CM | POA: Diagnosis not present

## 2020-01-26 DIAGNOSIS — Z4682 Encounter for fitting and adjustment of non-vascular catheter: Secondary | ICD-10-CM | POA: Diagnosis not present

## 2020-01-26 DIAGNOSIS — I25110 Atherosclerotic heart disease of native coronary artery with unstable angina pectoris: Secondary | ICD-10-CM | POA: Diagnosis not present

## 2020-01-26 DIAGNOSIS — I369 Nonrheumatic tricuspid valve disorder, unspecified: Secondary | ICD-10-CM | POA: Diagnosis not present

## 2020-01-26 DIAGNOSIS — Z8249 Family history of ischemic heart disease and other diseases of the circulatory system: Secondary | ICD-10-CM | POA: Diagnosis not present

## 2020-01-26 DIAGNOSIS — N183 Chronic kidney disease, stage 3 unspecified: Secondary | ICD-10-CM | POA: Diagnosis not present

## 2020-01-26 DIAGNOSIS — E782 Mixed hyperlipidemia: Secondary | ICD-10-CM | POA: Diagnosis not present

## 2020-01-26 DIAGNOSIS — E1122 Type 2 diabetes mellitus with diabetic chronic kidney disease: Secondary | ICD-10-CM | POA: Diagnosis present

## 2020-01-26 DIAGNOSIS — D62 Acute posthemorrhagic anemia: Secondary | ICD-10-CM | POA: Diagnosis not present

## 2020-01-26 HISTORY — PX: LEFT HEART CATH AND CORONARY ANGIOGRAPHY: CATH118249

## 2020-01-26 LAB — CBC WITH DIFFERENTIAL/PLATELET
Abs Immature Granulocytes: 0.06 K/uL (ref 0.00–0.07)
Basophils Absolute: 0.1 K/uL (ref 0.0–0.1)
Basophils Relative: 1 %
Eosinophils Absolute: 0.3 K/uL (ref 0.0–0.5)
Eosinophils Relative: 3 %
HCT: 41.8 % (ref 36.0–46.0)
Hemoglobin: 12.5 g/dL (ref 12.0–15.0)
Immature Granulocytes: 1 %
Lymphocytes Relative: 29 %
Lymphs Abs: 3 K/uL (ref 0.7–4.0)
MCH: 23.2 pg — ABNORMAL LOW (ref 26.0–34.0)
MCHC: 29.9 g/dL — ABNORMAL LOW (ref 30.0–36.0)
MCV: 77.7 fL — ABNORMAL LOW (ref 80.0–100.0)
Monocytes Absolute: 0.7 K/uL (ref 0.1–1.0)
Monocytes Relative: 7 %
Neutro Abs: 6.1 K/uL (ref 1.7–7.7)
Neutrophils Relative %: 59 %
Platelets: 296 K/uL (ref 150–400)
RBC: 5.38 MIL/uL — ABNORMAL HIGH (ref 3.87–5.11)
RDW: 16.1 % — ABNORMAL HIGH (ref 11.5–15.5)
WBC: 10.2 K/uL (ref 4.0–10.5)
nRBC: 0 % (ref 0.0–0.2)

## 2020-01-26 LAB — GLUCOSE, CAPILLARY
Glucose-Capillary: 119 mg/dL — ABNORMAL HIGH (ref 70–99)
Glucose-Capillary: 140 mg/dL — ABNORMAL HIGH (ref 70–99)

## 2020-01-26 LAB — SARS CORONAVIRUS 2 BY RT PCR (HOSPITAL ORDER, PERFORMED IN ~~LOC~~ HOSPITAL LAB): SARS Coronavirus 2: NEGATIVE

## 2020-01-26 LAB — SURGICAL PCR SCREEN
MRSA, PCR: NEGATIVE
Staphylococcus aureus: NEGATIVE

## 2020-01-26 LAB — COMPREHENSIVE METABOLIC PANEL
ALT: 13 U/L (ref 0–44)
AST: 18 U/L (ref 15–41)
Albumin: 4.3 g/dL (ref 3.5–5.0)
Alkaline Phosphatase: 80 U/L (ref 38–126)
Anion gap: 12 (ref 5–15)
BUN: 11 mg/dL (ref 8–23)
CO2: 22 mmol/L (ref 22–32)
Calcium: 10.5 mg/dL — ABNORMAL HIGH (ref 8.9–10.3)
Chloride: 108 mmol/L (ref 98–111)
Creatinine, Ser: 0.96 mg/dL (ref 0.44–1.00)
GFR calc Af Amer: >60 mL/min (ref >60)
GFR calc non Af Amer: 60 mL/min — ABNORMAL LOW (ref >60)
Glucose, Bld: 156 mg/dL — ABNORMAL HIGH (ref 70–99)
Potassium: 5 mmol/L (ref 3.5–5.1)
Sodium: 142 mmol/L (ref 135–145)
Total Bilirubin: 1 mg/dL (ref 0.3–1.2)
Total Protein: 7.2 g/dL (ref 6.5–8.1)

## 2020-01-26 LAB — TROPONIN I (HIGH SENSITIVITY)
Troponin I (High Sensitivity): 4 ng/L
Troponin I (High Sensitivity): 6 ng/L

## 2020-01-26 LAB — HEMOGLOBIN A1C
Hgb A1c MFr Bld: 8 % — ABNORMAL HIGH (ref 4.8–5.6)
Mean Plasma Glucose: 182.9 mg/dL

## 2020-01-26 SURGERY — LEFT HEART CATH AND CORONARY ANGIOGRAPHY
Anesthesia: LOCAL

## 2020-01-26 MED ORDER — LIDOCAINE HCL (PF) 1 % IJ SOLN
INTRAMUSCULAR | Status: AC
  Filled 2020-01-26: qty 30

## 2020-01-26 MED ORDER — MUPIROCIN 2 % EX OINT
1.0000 "application " | TOPICAL_OINTMENT | Freq: Two times a day (BID) | CUTANEOUS | Status: AC
  Administered 2020-01-26 – 2020-01-31 (×10): 1 via NASAL
  Filled 2020-01-26: qty 22

## 2020-01-26 MED ORDER — HEPARIN SODIUM (PORCINE) 1000 UNIT/ML IJ SOLN
INTRAMUSCULAR | Status: AC
  Filled 2020-01-26: qty 1

## 2020-01-26 MED ORDER — NEBIVOLOL HCL 5 MG PO TABS
25.0000 mg | ORAL_TABLET | Freq: Every day | ORAL | Status: DC
  Administered 2020-01-27 – 2020-02-01 (×6): 25 mg via ORAL
  Filled 2020-01-26 (×6): qty 1

## 2020-01-26 MED ORDER — SODIUM CHLORIDE 0.9 % IV SOLN: 250.0000 mL | INTRAVENOUS | Status: DC | PRN

## 2020-01-26 MED ORDER — SODIUM CHLORIDE 0.9 % IV SOLN: INTRAVENOUS | Status: AC

## 2020-01-26 MED ORDER — HEPARIN (PORCINE) 25000 UT/250ML-% IV SOLN
650.0000 [IU]/h | INTRAVENOUS | Status: DC
  Administered 2020-01-26: 650 [IU]/h via INTRAVENOUS
  Filled 2020-01-26: qty 250

## 2020-01-26 MED ORDER — HEPARIN (PORCINE) IN NACL 1000-0.9 UT/500ML-% IV SOLN
INTRAVENOUS | Status: DC | PRN
  Administered 2020-01-26 (×2): 500 mL

## 2020-01-26 MED ORDER — FENTANYL CITRATE (PF) 100 MCG/2ML IJ SOLN
INTRAMUSCULAR | Status: DC | PRN
  Administered 2020-01-26: 25 ug via INTRAVENOUS

## 2020-01-26 MED ORDER — ASPIRIN 81 MG PO CHEW: 81.0000 mg | CHEWABLE_TABLET | ORAL | Status: DC

## 2020-01-26 MED ORDER — SODIUM CHLORIDE 0.9% FLUSH
3.0000 mL | INTRAVENOUS | Status: DC | PRN
  Administered 2020-01-26: 3 mL via INTRAVENOUS

## 2020-01-26 MED ORDER — MIDAZOLAM HCL 2 MG/2ML IJ SOLN
INTRAMUSCULAR | Status: DC | PRN
  Administered 2020-01-26: 1 mg via INTRAVENOUS

## 2020-01-26 MED ORDER — ASPIRIN EC 81 MG PO TBEC
81.0000 mg | DELAYED_RELEASE_TABLET | Freq: Every day | ORAL | Status: DC
  Administered 2020-01-27 – 2020-02-01 (×6): 81 mg via ORAL
  Filled 2020-01-26 (×6): qty 1

## 2020-01-26 MED ORDER — NEBIVOLOL HCL 5 MG PO TABS: 5.0000 mg | ORAL_TABLET | Freq: Every day | ORAL | Status: DC

## 2020-01-26 MED ORDER — SODIUM CHLORIDE 0.9% FLUSH
3.0000 mL | Freq: Two times a day (BID) | INTRAVENOUS | Status: DC
  Administered 2020-01-26 – 2020-01-30 (×3): 3 mL via INTRAVENOUS

## 2020-01-26 MED ORDER — DIPHENHYDRAMINE HCL 25 MG PO CAPS
25.0000 mg | ORAL_CAPSULE | Freq: Every evening | ORAL | Status: DC | PRN
  Administered 2020-01-26 – 2020-01-30 (×5): 25 mg via ORAL
  Filled 2020-01-26 (×5): qty 1

## 2020-01-26 MED ORDER — HYDRALAZINE HCL 20 MG/ML IJ SOLN: 10.0000 mg | INTRAMUSCULAR | Status: AC | PRN

## 2020-01-26 MED ORDER — MIDAZOLAM HCL 2 MG/2ML IJ SOLN
INTRAMUSCULAR | Status: AC
  Filled 2020-01-26: qty 2

## 2020-01-26 MED ORDER — NITROGLYCERIN 2 % TD OINT
0.5000 [in_us] | TOPICAL_OINTMENT | Freq: Four times a day (QID) | TRANSDERMAL | Status: DC
  Administered 2020-01-27 (×3): 0.5 [in_us] via TOPICAL
  Filled 2020-01-26: qty 30

## 2020-01-26 MED ORDER — VERAPAMIL HCL 2.5 MG/ML IV SOLN
INTRAVENOUS | Status: DC | PRN
  Administered 2020-01-26: 10 mL via INTRA_ARTERIAL

## 2020-01-26 MED ORDER — INSULIN ASPART 100 UNIT/ML ~~LOC~~ SOLN
0.0000 [IU] | Freq: Three times a day (TID) | SUBCUTANEOUS | Status: DC
  Administered 2020-01-27 – 2020-01-29 (×6): 3 [IU] via SUBCUTANEOUS
  Administered 2020-01-29: 5 [IU] via SUBCUTANEOUS
  Administered 2020-01-29: 3 [IU] via SUBCUTANEOUS
  Administered 2020-01-30: 5 [IU] via SUBCUTANEOUS
  Administered 2020-01-30: 3 [IU] via SUBCUTANEOUS
  Administered 2020-01-30 – 2020-01-31 (×2): 5 [IU] via SUBCUTANEOUS
  Administered 2020-01-31 (×2): 3 [IU] via SUBCUTANEOUS
  Administered 2020-02-01: 2 [IU] via SUBCUTANEOUS
  Administered 2020-02-01: 5 [IU] via SUBCUTANEOUS

## 2020-01-26 MED ORDER — VERAPAMIL HCL 2.5 MG/ML IV SOLN
INTRAVENOUS | Status: AC
  Filled 2020-01-26: qty 2

## 2020-01-26 MED ORDER — HEPARIN BOLUS VIA INFUSION
3000.0000 [IU] | Freq: Once | INTRAVENOUS | Status: AC
  Administered 2020-01-26: 3000 [IU] via INTRAVENOUS
  Filled 2020-01-26: qty 3000

## 2020-01-26 MED ORDER — SODIUM CHLORIDE 0.9% FLUSH: 3.0000 mL | INTRAVENOUS | Status: DC | PRN

## 2020-01-26 MED ORDER — ONDANSETRON HCL 4 MG/2ML IJ SOLN: 4.0000 mg | Freq: Four times a day (QID) | INTRAMUSCULAR | Status: DC | PRN

## 2020-01-26 MED ORDER — NITROGLYCERIN 2 % TD OINT
1.0000 [in_us] | TOPICAL_OINTMENT | Freq: Once | TRANSDERMAL | Status: AC
  Administered 2020-01-26: 1 [in_us] via TOPICAL
  Filled 2020-01-26: qty 1

## 2020-01-26 MED ORDER — NEBIVOLOL HCL 20 MG PO TABS: 1.0000 | ORAL_TABLET | Freq: Every day | ORAL | Status: DC

## 2020-01-26 MED ORDER — LABETALOL HCL 5 MG/ML IV SOLN: 10.0000 mg | INTRAVENOUS | Status: AC | PRN

## 2020-01-26 MED ORDER — SODIUM CHLORIDE 0.9 % WEIGHT BASED INFUSION: 1.0000 mL/kg/h | INTRAVENOUS | Status: DC

## 2020-01-26 MED ORDER — HEPARIN SODIUM (PORCINE) 1000 UNIT/ML IJ SOLN
INTRAMUSCULAR | Status: DC | PRN
  Administered 2020-01-26: 3000 [IU] via INTRAVENOUS

## 2020-01-26 MED ORDER — HEPARIN (PORCINE) IN NACL 1000-0.9 UT/500ML-% IV SOLN
INTRAVENOUS | Status: AC
  Filled 2020-01-26: qty 1000

## 2020-01-26 MED ORDER — HEPARIN (PORCINE) 25000 UT/250ML-% IV SOLN
950.0000 [IU]/h | INTRAVENOUS | Status: DC
  Administered 2020-01-26: 650 [IU]/h via INTRAVENOUS
  Administered 2020-01-28 – 2020-01-31 (×4): 950 [IU]/h via INTRAVENOUS
  Filled 2020-01-26 (×4): qty 250

## 2020-01-26 MED ORDER — ACETAMINOPHEN 325 MG PO TABS
650.0000 mg | ORAL_TABLET | ORAL | Status: DC | PRN
  Administered 2020-01-26 – 2020-01-31 (×8): 650 mg via ORAL
  Filled 2020-01-26 (×9): qty 2

## 2020-01-26 MED ORDER — INSULIN ASPART 100 UNIT/ML ~~LOC~~ SOLN
0.0000 [IU] | Freq: Every day | SUBCUTANEOUS | Status: DC
  Administered 2020-01-28 – 2020-01-31 (×2): 2 [IU] via SUBCUTANEOUS

## 2020-01-26 MED ORDER — IOHEXOL 350 MG/ML SOLN
INTRAVENOUS | Status: DC | PRN
  Administered 2020-01-26: 40 mL via INTRA_ARTERIAL

## 2020-01-26 MED ORDER — PANTOPRAZOLE SODIUM 40 MG PO TBEC
40.0000 mg | DELAYED_RELEASE_TABLET | Freq: Every day | ORAL | Status: DC
  Administered 2020-01-27 – 2020-02-01 (×6): 40 mg via ORAL
  Filled 2020-01-26 (×6): qty 1

## 2020-01-26 MED ORDER — SODIUM CHLORIDE 0.9% FLUSH
3.0000 mL | Freq: Two times a day (BID) | INTRAVENOUS | Status: DC
  Administered 2020-01-26 – 2020-01-30 (×7): 3 mL via INTRAVENOUS

## 2020-01-26 MED ORDER — LIDOCAINE HCL (PF) 1 % IJ SOLN
INTRAMUSCULAR | Status: DC | PRN
  Administered 2020-01-26: 2 mL via INTRADERMAL

## 2020-01-26 MED ORDER — SODIUM CHLORIDE 0.9 % WEIGHT BASED INFUSION
3.0000 mL/kg/h | INTRAVENOUS | Status: DC
  Administered 2020-01-26: 3 mL/kg/h via INTRAVENOUS

## 2020-01-26 MED ORDER — FENTANYL CITRATE (PF) 100 MCG/2ML IJ SOLN
INTRAMUSCULAR | Status: AC
  Filled 2020-01-26: qty 2

## 2020-01-26 SURGICAL SUPPLY — 10 items
CATH 5FR JL3.5 JR4 ANG PIG MP (CATHETERS) ×1 IMPLANT
DEVICE RAD TR BAND REGULAR (VASCULAR PRODUCTS) ×1 IMPLANT
GLIDESHEATH SLEND SS 6F .021 (SHEATH) ×1 IMPLANT
GUIDEWIRE INQWIRE 1.5J.035X260 (WIRE) IMPLANT
INQWIRE 1.5J .035X260CM (WIRE) ×2
KIT HEART LEFT (KITS) ×2 IMPLANT
PACK CARDIAC CATHETERIZATION (CUSTOM PROCEDURE TRAY) ×2 IMPLANT
TRANSDUCER W/STOPCOCK (MISCELLANEOUS) ×2 IMPLANT
TUBING CIL FLEX 10 FLL-RA (TUBING) ×2 IMPLANT
WIRE HI TORQ VERSACORE-J 145CM (WIRE) ×1 IMPLANT

## 2020-01-26 NOTE — Progress Notes (Addendum)
ANTICOAGULATION CONSULT NOTE - Follow Up Consult  Pharmacy Consult for Heparin Indication: chest pain/ACS  Allergies  Allergen Reactions  . Codeine Nausea And Vomiting and Other (See Comments)    Headache and vomiting  . Ace Inhibitors     Drop in GFR  . Clarithromycin     Unable to sleep   . Crestor [Rosuvastatin]     Body aches  . Lipitor [Atorvastatin]     Body aches  . Avandamet [Rosiglitazone-Metformin] Rash and Other (See Comments)    Makes her hyper    Patient Measurements: Total Body Weight: 54.3 kg Height: 61 inches Heparin Dosing Weight: 54.3 kg  Vital Signs: Temp: 96.6 F (35.9 C) (07/09 1518) Temp Source: Oral (07/09 1518) BP: 109/83 (07/09 1720) Pulse Rate: 68 (07/09 1720)  Labs: Recent Labs    01/26/20 1411  HGB 12.5  HCT 41.8  PLT 296  CREATININE 0.96  TROPONINIHS 4    Estimated Creatinine Clearance: 40.6 mL/min (by C-G formula based on SCr of 0.96 mg/dL).   Medical History: Past Medical History:  Diagnosis Date  . Anxiety    no meds  . Breast cancer ([place]) 2015   left breast   . CKD (chronic kidney disease), stage III    no meds, blood draws q 47mos by Dr. Alyson Ingles in [place]  . Depression    no meds  . Diabetes mellitus without complication ([place])    type 2  . GERD (gastroesophageal reflux disease)    occ  . Hyperlipidemia    diet controlled, no meds  . Hypertension   . Iron deficiency anemia   . Osteopenia   . Peripheral vascular disease ([place])    rt carotid stenosis  . Pneumonia 12/2011, 05/2012   hx x2 in 2013  . PONV (postoperative nausea and vomiting)   . Trigger finger    resolved with cortisone injections    Assessment: 74 yr old female who presented to ED with chest pain. Pharmacy was consulted to dose heparin for CP/ACS. H/H, platelets are WNL. Patient was not on anticoagulant therapy PTA.  Pt rec'd heparin 3000 units IV bolus X 1, followed by heparin infusion at 650 units/hr, in the ED.  Pt is S/P cardiac  cath this afternoon, with findings of severe three-vessel disease; pt admitted for cardiac surgery consultation for CABG (CABG planned for next week). Pharmacy is consulted to restart heparin infusion 2 hrs after TR band removal. Per 2C RN, TR band was removed at 2050 PM. Per RN, no bleeding issues post cath.  Goal of Therapy:  Heparin level 0.3-0.7 units/ml Monitor platelets by anticoagulation protocol: Yes   Plan:  Resume heparin infusion at 650 units/hr at 2300 PM (~2 hrs after TR band removal) Check heparin level 6 hrs after restarting infusion Monitor daily heparin level, CBC Monitor for signs/symptoms of bleeding F/U plans for CABG  Gillermina Hu, PharmD, BCPS, [facility] Clinical Pharmacist 01/26/2020,5:44 PM

## 2020-01-26 NOTE — Consult Note (Signed)
[address]       [place] [address]             [phone]        Faith Gaines Record #[id number] Date of Birth: Nov 27, 1945  Referring: No ref. provider found Primary Care: Maury Dus, MD Primary Cardiologist:No primary care provider on file.  Chief Complaint:    Chief Complaint  Patient presents with  . Chest Pain    History of Present Illness:     This is a 74 year old female that is admitted following left heart cath which showed severe three-vessel disease.  She was seen in cardiology clinic earlier today, and has had a several month history of progressive angina.  Prior to this she was active with an exercise class but this was discontinued due to shortness of breath.  She denies any orthopnea palpitations or syncopal episodes.  CTS has been consulted to assist her care.   Past Medical and Surgical History: Previous Chest Surgery: Left breast surgery without radiation Previous Chest Radiation: No Diabetes Mellitus: Yes.  HbA1C 8 Creatinine: 0.96  Past Medical History:  Diagnosis Date  . Anxiety    no meds  . Breast cancer ([place]) 2015   left breast   . CKD (chronic kidney disease), stage III    no meds, blood draws q 35mos by Dr. Alyson Ingles in [place]  . Depression    no meds  . Diabetes mellitus without complication ([place])    type 2  . GERD (gastroesophageal reflux disease)    occ  . Hyperlipidemia    diet controlled, no meds  . Hypertension   . Iron deficiency anemia   . Osteopenia   . Peripheral vascular disease ([place])    rt carotid stenosis  . Pneumonia 12/2011, 05/2012   hx x2 in 2013  . PONV (postoperative nausea and vomiting)   . Trigger finger    resolved with cortisone injections    Past Surgical History:  Procedure Laterality Date  . ANTERIOR AND POSTERIOR REPAIR N/A 10/22/2014   Procedure: CYSTO, REPAIR CYSTOCELE/RECTOCELE;  Surgeon: Bjorn Loser, MD;  Location: [facility];  Service:  Urology;  Laterality: N/A;  . APPENDECTOMY    . BLADDER SURGERY  05/1981   bladder tack  . BREAST LUMPECTOMY Left 2015  . CHOLECYSTECTOMY  1995  . COLONOSCOPY    . ENDARTERECTOMY Right 06/29/2013   Procedure: ENDARTERECTOMY CAROTID-RIGHT, WITH DACRON PATCH ANGIOPLASTY;  Surgeon: Mal Misty, MD;  Location: [facility];  Service: Vascular;  Laterality: Right;  . LAPAROSCOPIC ASSISTED VAGINAL HYSTERECTOMY N/A 10/22/2014   Procedure: LAPAROSCOPIC ASSISTED VAGINAL HYSTERECTOMY;  Surgeon: Marylynn Pearson, MD;  Location: [facility];  Service: Gynecology;  Laterality: N/A;  Pam from Dr. Mikle Bosworth office will call and add additional surgery for this patient in our block time.  Marland Kitchen MELANOMA EXCISION  12/1980   left leg,  . PARTIAL MASTECTOMY WITH NEEDLE LOCALIZATION Left 08/22/2013   Procedure: PARTIAL MASTECTOMY WITH NEEDLE LOCALIZATION;  Surgeon: Stark Klein, MD;  Location: [facility];  Service: General;  Laterality: Left;  . SALPINGOOPHORECTOMY Bilateral 10/22/2014   Procedure: SALPINGO OOPHORECTOMY;  Surgeon: Marylynn Pearson, MD;  Location: [facility];  Service: Gynecology;  Laterality: Bilateral;  . 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 TUBAL LIGATION    . VAGINAL PROLAPSE REPAIR N/A 10/22/2014   Procedure: VAULT PROLAPSE WITH GRAFT;  Surgeon: Bjorn Loser, MD;  Location: [facility];  Service:  Urology;  Laterality: N/A;  . WISDOM TOOTH EXTRACTION       Social History   Tobacco Use  Smoking Status Former Smoker  . Packs/day: 0.25  . Years: 3.00  . Pack years: 0.75  . Types: Cigarettes  . Quit date: 07/20/2010  . Years since quitting: 9.5  Smokeless Tobacco Never Used    Social History   Substance and Sexual Activity  Alcohol Use No  . Alcohol/week: 0.0 standard drinks     Allergies  Allergen Reactions  . Codeine Nausea And Vomiting and Other (See Comments)    Headache and vomiting  . Ace Inhibitors     Drop in GFR  . Clarithromycin     Unable to sleep   . Crestor  [Rosuvastatin]     Body aches  . Lipitor [Atorvastatin]     Body aches  . Avandamet [Rosiglitazone-Metformin] Rash and Other (See Comments)    Makes her hyper    Medications: Asprin: Yes Statin: No due to allergy Beta Blocker: Yes Ace Inhibitor: No Anti-Coagulation: No  Current Facility-Administered Medications  Medication Dose Route Frequency Provider Last Rate Last Admin  . 0.9 %  sodium chloride infusion   Intravenous Continuous End, Christopher, MD 75 mL/hr at 01/26/20 1714 Rate Change at 01/26/20 1714  . 0.9 %  sodium chloride infusion  250 mL Intravenous PRN End, Jovanka Westgate Gave, MD      . acetaminophen (TYLENOL) tablet 650 mg  650 mg Oral Q4H PRN End, Andreika Vandagriff Gave, MD      . Derrill Memo ON 01/27/2020] aspirin EC tablet 81 mg  81 mg Oral Daily End, Christopher, MD      . hydrALAZINE (APRESOLINE) injection 10 mg  10 mg Intravenous Q20 Min PRN End, Ishika Chesterfield Gave, MD      . Derrill Memo ON 01/27/2020] insulin aspart (novoLOG) injection 0-15 Units  0-15 Units Subcutaneous TID WC End, Nancyjo Givhan Gave, MD      . insulin aspart (novoLOG) injection 0-5 Units  0-5 Units Subcutaneous QHS End, Christopher, MD      . labetalol (NORMODYNE) injection 10 mg  10 mg Intravenous Q10 min PRN End, Niara Bunker Gave, MD      . Derrill Memo ON 01/27/2020] nebivolol (BYSTOLIC) tablet 5 mg  5 mg Oral Daily End, Denicia Pagliarulo Gave, MD      . Derrill Memo ON 01/27/2020] Nebivolol HCl TABS 20 mg  1 tablet Oral Daily End, Christopher, MD      . nitroGLYCERIN (NITROGLYN) 2 % ointment 0.5 inch  0.5 inch Topical Q6H End, Christopher, MD      . ondansetron (ZOFRAN) injection 4 mg  4 mg Intravenous Q6H PRN End, Kynzi Levay Gave, MD      . Derrill Memo ON 01/27/2020] pantoprazole (PROTONIX) EC tablet 40 mg  40 mg Oral Daily End, Christopher, MD      . sodium chloride flush (NS) 0.9 % injection 3 mL  3 mL Intravenous Q12H End, Christopher, MD   3 mL at 01/26/20 1426  . sodium chloride flush (NS) 0.9 % injection 3 mL  3 mL Intravenous Q12H End, Christopher, MD      . sodium  chloride flush (NS) 0.9 % injection 3 mL  3 mL Intravenous PRN End, Christopher, MD        Medications Prior to Admission  Medication Sig Dispense Refill Last Dose  . aspirin EC 81 MG tablet Take 81 mg by mouth daily.   01/26/2020  . Canagliflozin (INVOKANA) 300 MG TABS Take 300 mg by mouth daily.    01/26/2020  . nebivolol (BYSTOLIC)  5 MG tablet Take 5 mg by mouth daily.   01/26/2020  . Nebivolol HCl (BYSTOLIC) 20 MG TABS Take 1 tablet by mouth daily.   01/26/2020  . Omega-3 Fatty Acids (OMEGA-3 2100) 1050 MG CAPS Take 1 capsule by mouth daily.    01/26/2020  . pantoprazole (PROTONIX) 40 MG tablet Take 40 mg by mouth daily.   01/26/2020  . Saxagliptin-Metformin (KOMBIGLYZE XR) 2.11-998 MG TB24 Take 1 tablet by mouth 2 (two) times daily. Reported on 08/21/2015   01/26/2020  . VITAMIN D PO Take 1,000 Units by mouth daily.   01/26/2020  . vitamin E 1000 UNIT capsule Take 1,000 Units by mouth daily.   01/26/2020    Family History  Problem Relation Age of Onset  . Cancer Mother        breast, uterus, lung  . Heart attack Mother 61  . Stroke Mother   . Heart disease Mother        Open Heart Surgery  . Hypertension Mother   . Varicose Veins Mother   . Heart disease Father   . Heart attack Father 73       Died of MI age 69  . Kidney disease Father   . Hypertension Father   . Diabetes Father   . Cancer Father   . Asthma Father   . Heart attack Sister 5       ICD  . Heart disease Sister   . Healthy Daughter   . Healthy Daughter   . Healthy Son      Review of Systems:   Review of Systems  Constitutional: Positive for malaise/fatigue.  Respiratory: Positive for shortness of breath. Negative for cough.   Cardiovascular: Positive for chest pain. Negative for palpitations, orthopnea and leg swelling.  Musculoskeletal: Positive for joint pain.  Neurological: Negative.       Physical Exam: BP (!) (P) 157/58 (BP Location: Left Arm)   Pulse 68   Temp (P) 97.8 F (36.6 C) (Oral)   Resp (P) 16    SpO2 99%  Physical Exam Constitutional:      General: She is not in acute distress.    Appearance: She is well-developed.  Cardiovascular:     Heart sounds: No murmur heard.   Pulmonary:     Effort: Pulmonary effort is normal.     Breath sounds: Normal breath sounds.  Chest:     Chest wall: No tenderness.  Abdominal:     Palpations: Abdomen is soft.  Musculoskeletal:     Cervical back: Normal range of motion.     Right lower leg: No tenderness. No edema.     Left lower leg: No tenderness. No edema.  Skin:    General: Skin is warm and dry.  Neurological:     General: No focal deficit present.     Mental Status: She is alert and oriented to person, place, and time.       Diagnostic Studies & Laboratory data:    Left Heart Catherization: Overall small vessels.  Small vessel which are in the RCA distribution.  There appears to be a target distal RCA.  Left-sided vessels are little bit bigger.  Has good targets in 6 circumflex distribution as well as LAD.  She has several tandem lesions with LAD.  Echo: Pending   I have independently reviewed the above radiologic studies and discussed with the patient   Recent Lab Findings: Lab Results  Component Value Date   WBC 10.2 01/26/2020  HGB 12.5 01/26/2020   HCT 41.8 01/26/2020   PLT 296 01/26/2020   GLUCOSE 156 (H) 01/26/2020   ALT 13 01/26/2020   AST 18 01/26/2020   NA 142 01/26/2020   K 5.0 01/26/2020   CL 108 01/26/2020   CREATININE 0.96 01/26/2020   BUN 11 01/26/2020   CO2 22 01/26/2020   TSH 1.870 12/27/2019   INR 0.94 06/28/2013   HGBA1C 8.0 (H) 01/26/2020      Assessment / Plan:   74 year old female presents with progressive angina and three-vessel coronary disease.  She is also diabetic with a hemoglobin A1c of 8.  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  We reviewed the images and she has good targets in her left  system, and small targets in the RCA.  The echo is pending.  Given her symptoms and presentation she will require admission until surgery.  She is agreeable to proceed with CABG and is tentatively scheduled for next week.       I  spent 40 minutes counseling the patient face to face.   Lajuana Matte 01/26/2020 6:51 PM

## 2020-01-26 NOTE — Plan of Care (Signed)

## 2020-01-26 NOTE — Progress Notes (Signed)
ANTICOAGULATION CONSULT NOTE - Initial Consult  Pharmacy Consult for Heparin Indication: chest pain/ACS  Allergies  Allergen Reactions  . Codeine Nausea And Vomiting and Other (See Comments)    Headache and vomiting  . Ace Inhibitors     Drop in GFR  . Clarithromycin     Unable to sleep   . Crestor [Rosuvastatin]     Body aches  . Lipitor [Atorvastatin]     Body aches  . Avandamet [Rosiglitazone-Metformin] Rash and Other (See Comments)    Makes her hyper    Patient Measurements:   Heparin Dosing Weight: 54.3kg  Vital Signs: Temp: 96.6 F (35.9 C) (07/09 1134) BP: 154/73 (07/09 1134) Pulse Rate: 70 (07/09 1134)  Labs: Recent Labs    01/26/20 1411  HGB 12.5  HCT 41.8  PLT 296    CrCl cannot be calculated (Patient's most recent lab result is older than the maximum 21 days allowed.).   Medical History: Past Medical History:  Diagnosis Date  . Anxiety    no meds  . Breast cancer ([place]) 2015   left breast   . CKD (chronic kidney disease), stage III    no meds, blood draws q 76mos by Dr. Alyson Ingles in [place]  . Depression    no meds  . Diabetes mellitus without complication ([place])    type 2  . GERD (gastroesophageal reflux disease)    occ  . Hyperlipidemia    diet controlled, no meds  . Hypertension   . Iron deficiency anemia   . Osteopenia   . Peripheral vascular disease ([place])    rt carotid stenosis  . Pneumonia 12/2011, 05/2012   hx x2 in 2013  . PONV (postoperative nausea and vomiting)   . Trigger finger    resolved with cortisone injections    Medications:  Scheduled:  . heparin  3,000 Units Intravenous Once  . sodium chloride flush  3 mL Intravenous Q12H    Assessment: Patient is a 74yo female who presents with chest pain. H/H and Plt are wnl. Patient was not on anticoagulant therapy PTA.  Goal of Therapy:  Heparin level 0.3-0.7 units/ml Monitor platelets by anticoagulation protocol: Yes   Plan:  Give 3000 units bolus x 1 Start  heparin infusion at 650 units/hr  Check 8h heparin level. Monitor daily HL, CBC, & s/sx of bleeding.  Beckey Rutter 01/26/2020,2:40 PM

## 2020-01-26 NOTE — ED Provider Notes (Signed)
[facility] EMERGENCY DEPARTMENT Provider Note   CSN: [id number] Arrival date & time: 01/26/20  1352     History Chief Complaint  Patient presents with  . Chest Pain    Faith Gaines is a 74 y.o. female presenting for evaluation of chest pain and shortness of breath.  Patient states that over the past month, she has had significant symptoms including chest pain and shortness of breath, even with minimal exertion.  At rest, she reports only chest pressure.  She saw Dr. Percival Spanish with cardiology today, who recommended she come to the ER for urgent catheterization due to severity of symptoms.  Patient is found to have significant three-vessel disease on a cardiac CTA that was performed yesterday.  She denies fevers, chills, cough, nausea, vomiting abdominal pain, urinary symptoms, normal bowel movements.  She denies previous calf.  She has had carotid endarterectomy in the past.  She denies previous stroke or heart attack.  She was given aspirin with EMS.  She is not on blood thinners.  Patient reports recent difficulty controlling blood pressure, despite taking Bystolic as prescribed.   Additional history obtained from chart review.  Patient with a history of hypertension, diabetes, GERD, breast cancer, CKD, depression, PVD.   HPI     Past Medical History:  Diagnosis Date  . Anxiety    no meds  . Breast cancer ([place]) 2015   left breast   . CKD (chronic kidney disease), stage III    no meds, blood draws q 88mos by Dr. Alyson Ingles in [place]  . Depression    no meds  . Diabetes mellitus without complication ([place])    type 2  . GERD (gastroesophageal reflux disease)    occ  . Hyperlipidemia    diet controlled, no meds  . Hypertension   . Iron deficiency anemia   . Osteopenia   . Peripheral vascular disease ([place])    rt carotid stenosis  . Pneumonia 12/2011, 05/2012   hx x2 in 2013  . PONV (postoperative nausea and vomiting)   . Trigger finger     resolved with cortisone injections    Patient Active Problem List   Diagnosis Date Noted  . Educated about COVID-19 virus infection 12/26/2019  . Precordial chest pain 12/26/2019  . Primary osteoarthritis of both hands 01/30/2019  . Prolapse of uterus 10/22/2014  . Iron deficiency anemia due to chronic blood loss   . Carotid stenosis 01/23/2014  . History of melanoma excision 01/16/2014  . DM (diabetes mellitus), type 2 with peripheral vascular complications ([place]) 76/19/5093  . Dyspnea 01/16/2014  . DM (diabetes mellitus), type 2 with renal complications ([place])   . Hyperlipidemia   . Hypertension   . Carotid bruit   . Osteopenia   . Peripheral vascular disease ([place])   . GERD (gastroesophageal reflux disease)   . Diarrhea   . Headache(784.0) 08/01/2013  . Carotid artery disease ([place]) 06/29/2013  . Occlusion and stenosis of carotid artery without mention of cerebral infarction 06/27/2013  . Breast cancer of upper-outer quadrant of left female breast, Tis, 3 oclock 06/23/2013    Past Surgical History:  Procedure Laterality Date  . ANTERIOR AND POSTERIOR REPAIR N/A 10/22/2014   Procedure: CYSTO, REPAIR CYSTOCELE/RECTOCELE;  Surgeon: Bjorn Loser, MD;  Location: [facility];  Service: Urology;  Laterality: N/A;  . APPENDECTOMY    . BLADDER SURGERY  05/1981   bladder tack  . BREAST LUMPECTOMY Left 2015  . CHOLECYSTECTOMY  1995  . COLONOSCOPY    .  ENDARTERECTOMY Right 06/29/2013   Procedure: ENDARTERECTOMY CAROTID-RIGHT, WITH DACRON PATCH ANGIOPLASTY;  Surgeon: Mal Misty, MD;  Location: [facility];  Service: Vascular;  Laterality: Right;  . LAPAROSCOPIC ASSISTED VAGINAL HYSTERECTOMY N/A 10/22/2014   Procedure: LAPAROSCOPIC ASSISTED VAGINAL HYSTERECTOMY;  Surgeon: Marylynn Pearson, MD;  Location: [facility];  Service: Gynecology;  Laterality: N/A;  Pam from Dr. Mikle Bosworth office will call and add additional surgery for this patient in our block time.  Marland Kitchen MELANOMA EXCISION  12/1980   left  leg,  . PARTIAL MASTECTOMY WITH NEEDLE LOCALIZATION Left 08/22/2013   Procedure: PARTIAL MASTECTOMY WITH NEEDLE LOCALIZATION;  Surgeon: Stark Klein, MD;  Location: [facility];  Service: General;  Laterality: Left;  . SALPINGOOPHORECTOMY Bilateral 10/22/2014   Procedure: SALPINGO OOPHORECTOMY;  Surgeon: Marylynn Pearson, MD;  Location: [facility];  Service: Gynecology;  Laterality: Bilateral;  . 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 TUBAL LIGATION    . VAGINAL PROLAPSE REPAIR N/A 10/22/2014   Procedure: VAULT PROLAPSE WITH GRAFT;  Surgeon: Bjorn Loser, MD;  Location: [facility];  Service: Urology;  Laterality: N/A;  . WISDOM TOOTH EXTRACTION       OB History   No obstetric history on file.     Family History  Problem Relation Age of Onset  . Cancer Mother        breast, uterus, lung  . Heart attack Mother 57  . Stroke Mother   . Heart disease Mother        Open Heart Surgery  . Hypertension Mother   . Varicose Veins Mother   . Heart disease Father   . Heart attack Father 33       Died of MI age 34  . Kidney disease Father   . Hypertension Father   . Diabetes Father   . Cancer Father   . Asthma Father   . Heart attack Sister 27       ICD  . Heart disease Sister   . Healthy Daughter   . Healthy Daughter   . Healthy Son     Social History   Tobacco Use  . Smoking status: Former Smoker    Packs/day: 0.25    Years: 3.00    Pack years: 0.75    Types: Cigarettes    Quit date: 07/20/2010    Years since quitting: 9.5  . Smokeless tobacco: Never Used  Vaping Use  . Vaping Use: Never used  Substance Use Topics  . Alcohol use: No    Alcohol/week: 0.0 standard drinks  . Drug use: No    Home Medications Prior to Admission medications   Medication Sig Start Date End Date Taking? Authorizing Provider  aspirin EC 81 MG tablet Take 81 mg by mouth daily.    [provider]  Canagliflozin (INVOKANA) 300 MG TABS Take 300 mg by mouth daily.     [provider]  nebivolol (BYSTOLIC) 5 MG tablet Take 5 mg by mouth daily.    [provider]  Nebivolol HCl (BYSTOLIC) 20 MG TABS Take 1 tablet by mouth daily.    [provider]  Omega-3 Fatty Acids (OMEGA-3 2100) 1050 MG CAPS Take by mouth daily.    [provider]  pantoprazole (PROTONIX) 40 MG tablet Take 40 mg by mouth daily.    [provider]  Saxagliptin-Metformin (KOMBIGLYZE XR) 2.11-998 MG TB24 Take 1 tablet by mouth 2 (two) times daily. Reported on 08/21/2015    [provider]  VITAMIN D PO Take 1,000 Units by mouth daily.    [provider]    Allergies    Codeine, Ace inhibitors, Clarithromycin, Crestor [rosuvastatin], Lipitor [atorvastatin], and Avandamet [rosiglitazone-metformin]  Review of Systems   Review of Systems  Respiratory: Positive for shortness of breath.   Cardiovascular: Positive for chest pain.  All other systems reviewed and are negative.   Physical Exam Updated Vital Signs BP (!) 183/85   Pulse 71   Temp (!) 96.6 F (35.9 C) (Oral)   Resp 15   SpO2 100%   Physical Exam Vitals and nursing note reviewed.  Constitutional:      Appearance: She is well-developed.     Comments: Appears uncomfortable  HENT:     Head: Normocephalic and atraumatic.  Eyes:     Conjunctiva/sclera: Conjunctivae normal.     Pupils: Pupils are equal, round, and reactive to light.  Cardiovascular:     Rate and Rhythm: Normal rate and regular rhythm.     Pulses: Normal pulses.  Pulmonary:     Effort: Pulmonary effort is normal. No respiratory distress.     Breath sounds: Normal breath sounds. No wheezing.     Comments: Clear lung sounds Abdominal:     General: There is no distension.     Palpations: Abdomen is soft. There is no mass.     Tenderness: There is no abdominal tenderness. There is no guarding or rebound.  Musculoskeletal:        General: Normal range of motion.     Cervical back: Normal range of motion  and neck supple.     Right lower leg: No edema.     Left lower leg: No edema.  Skin:    General: Skin is warm and dry.     Capillary Refill: Capillary refill takes less than 2 seconds.  Neurological:     Mental Status: She is alert and oriented to person, place, and time.     ED Results / Procedures / Treatments   Labs (all labs ordered are listed, but only abnormal results are displayed) Labs Reviewed  CBC WITH DIFFERENTIAL/PLATELET - Abnormal; Notable for the following components:      Result Value   RBC 5.38 (*)    MCV 77.7 (*)    MCH 23.2 (*)    MCHC 29.9 (*)    RDW 16.1 (*)    All other components within normal limits  SARS CORONAVIRUS 2 BY RT PCR (HOSPITAL ORDER, [facility])  COMPREHENSIVE METABOLIC PANEL  HEPARIN LEVEL (UNFRACTIONATED)  TROPONIN I (HIGH SENSITIVITY)    EKG None  Radiology CT CORONARY MORPH W/CTA COR W/SCORE W/CA W/CM &/OR WO/CM  Addendum Date: 01/25/2020   ADDENDUM REPORT: 01/25/2020 17:14 CLINICAL DATA:  Chest pain EXAM: Cardiac CTA MEDICATIONS: Sub lingual nitro. 4mg  x 2 TECHNIQUE: The patient was scanned on a Siemens 397 slice scanner. Gantry rotation speed was 250 msecs. Collimation was 0.6 mm. A 100 kV prospective scan was triggered in the ascending thoracic aorta at 35-75% of the R-R interval. Average HR during the scan was 60 bpm. The 3D data set was interpreted on a dedicated work station using MPR, MIP and VRT modes. A total of 80cc of contrast was used. FINDINGS: Non-cardiac: See separate report from [facility]. Pulmonary veins drain normally to the left atrium. Calcium Score: 1657 Agatston units. Coronary Arteries: Right dominant with no anomalies LM: Calcified plaque with no significant stenosis. LAD system: Extensive ostial/proximal/mid LAD mixed plaque.  Possible severe (70-90%) stenosis in the ostial/proximal LAD. Circumflex system: Extensive proximal/mid LCx mixed plaque. Possible severe (70-90%) stenosis in  the proximal LCx. RCA system: Extensive mixed plaque in the proximal to mid RCA. Possible severe (70-90%) stenosis. IMPRESSION: 1. Coronary artery calcium score 1657 Agatston units. This places the patient in the 98th percentile for age and gender, suggesting high risk for future cardiac events. 2. There is extensive proximal to mid coronary plaque in all three major vessels. I am concerned for possible severe stenosis in each vessel. However, given possibility for blooming artifact with copious calcification, will need to send for FFR to confirm. Dalton Mclean Electronically Signed   By: Loralie Champagne M.D.   On: 01/25/2020 17:14   Result Date: 01/25/2020 EXAM: OVER-READ INTERPRETATION  CT CHEST The following report is an over-read performed by radiologist Dr. Vinnie Langton of [facility], [place] on 01/25/2020. This over-read does not include interpretation of cardiac or coronary anatomy or pathology. The coronary calcium score/coronary CTA interpretation by the cardiologist is attached. COMPARISON:  None. FINDINGS: Aortic atherosclerosis. Within the visualized portions of the thorax there are no suspicious appearing pulmonary nodules or masses, there is no acute consolidative airspace disease, no pleural effusions, no pneumothorax and no lymphadenopathy. Visualized portions of the upper abdomen are unremarkable. There are no aggressive appearing lytic or blastic lesions noted in the visualized portions of the skeleton. Postoperative changes of lumpectomy noted in the left breast. IMPRESSION: 1.  Aortic Atherosclerosis (ICD10-I70.0). Electronically Signed: By: Vinnie Langton M.D. On: 01/25/2020 13:55    Procedures .Critical Care Performed by: Franchot Heidelberg, PA-C Authorized by: Franchot Heidelberg, PA-C   Critical care provider statement:    Critical care time (minutes):  35   Critical care time was exclusive of:  Separately billable procedures and treating other patients and teaching time    Critical care was necessary to treat or prevent imminent or life-threatening deterioration of the following conditions:  Cardiac failure   Critical care was time spent personally by me on the following activities:  Blood draw for specimens, development of treatment plan with patient or surrogate, discussions with consultants, examination of patient, obtaining history from patient or surrogate, ordering and performing treatments and interventions, ordering and review of laboratory studies, ordering and review of radiographic studies, pulse oximetry, re-evaluation of patient's condition and review of old charts   I assumed direction of critical care for this patient from another provider in my specialty: no   Comments:     Patient with unstable angina requiring heparin drip and urgent catheterization.   (including critical care time)  Medications Ordered in ED Medications  sodium chloride flush (NS) 0.9 % injection 3 mL (3 mLs Intravenous Given 01/26/20 1426)  heparin bolus via infusion 3,000 Units (has no administration in time range)  heparin ADULT infusion 100 units/mL (25000 units/233mL sodium chloride 0.45%) (650 Units/hr Intravenous New Bag/Given 01/26/20 1524)  nitroGLYCERIN (NITROGLYN) 2 % ointment 1 inch (1 inch Topical Given 01/26/20 1427)    ED Course  I have reviewed the triage vital signs and the nursing notes.  Pertinent labs & imaging results that were available during my care of the patient were reviewed by me and considered in my medical decision making (see chart for details).    MDM Rules/Calculators/A&P                          Patient presented for evaluation of shortness of breath, chest pain, chest  pressure.  She was sent from cardiology to the ER concern for unstable angina and needing an urgent cath.  She had an abnormal CTA yesterday.  On exam, patient appears uncomfortable, but nontoxic.  She reports chest pressure at rest.  Will obtain labs, Covid test, and consult  cardiology.  Patient has already received aspirin.  Will start heparin drip.  Case discussed with attending, Dr. Sabra Heck evaluated the patient.  Discussed with cardiology, patient to be admitted.  Final Clinical Impression(s) / ED Diagnoses Final diagnoses:  Unstable angina [facility])    Rx / DC Orders ED Discharge Orders    None       Franchot Heidelberg, PA-C 01/26/20 1534    Noemi Chapel, MD 01/27/20 1025

## 2020-01-26 NOTE — ED Triage Notes (Signed)
Pt BIB [facility] from heartcare clinic. Pt had CT scan done yesterday which showed severe 3 vessel disease. Cardiologist had her sent over for possible cath. Pt not presenting with any distress. VSS.

## 2020-01-26 NOTE — Interval H&P Note (Signed)
History and Physical Interval Note:  01/26/2020 4:29 PM  Faith Gaines  has presented today for surgery, with the diagnosis of unstable angina and abnormal cardiac CTA.  The various methods of treatment have been discussed with the patient and family. After consideration of risks, benefits and other options for treatment, the patient has consented to  Procedure(s): LEFT HEART CATH AND CORONARY ANGIOGRAPHY (N/A) as a surgical intervention.  The patient's history has been reviewed, patient examined, no change in status, stable for surgery.  I have reviewed the patient's chart and labs.  Questions were answered to the patient's satisfaction.    Cath Lab Visit (complete for each Cath Lab visit)  Clinical Evaluation Leading to the Procedure:   ACS: Yes.    Non-ACS:    Anginal Classification: CCS IV  Anti-ischemic medical therapy: Minimal Therapy (1 class of medications)  Non-Invasive Test Results: High-risk stress test findings: cardiac mortality >3%/year (three-vessel disease by cardiac CTA)  Prior CABG: No previous CABG  Dailen Mcclish

## 2020-01-26 NOTE — Patient Instructions (Signed)
Medication Instructions:  No changes *If you need a refill on your cardiac medications before your next appointment, please call your pharmacy*   Lab Work: None ordered If you have labs (blood work) drawn today and your tests are completely normal, you will receive your results only by: Marland Kitchen MyChart Message (if you have MyChart) OR . A paper copy in the mail If you have any lab test that is abnormal or we need to change your treatment, we will call you to review the results.   Testing/Procedures: None ordered   Follow-Up: At [facility], you and your health needs are our priority.  As part of our continuing mission to provide you with exceptional heart care, we have created designated Provider Care Teams.  These Care Teams include your primary Cardiologist (physician) and Advanced Practice Providers (APPs -  Physician Assistants and Nurse Practitioners) who all work together to provide you with the care you need, when you need it.  We recommend signing up for the patient portal called "MyChart".  Sign up information is provided on this After Visit Summary.  MyChart is used to connect with patients for Virtual Visits (Telemedicine).  Patients are able to view lab/test results, encounter notes, upcoming appointments, etc.  Non-urgent messages can be sent to your provider as well.   To learn more about what you can do with MyChart, go to [email or web address].    Your next appointment:   Patient sent to hospital via EMS

## 2020-01-26 NOTE — Progress Notes (Signed)
Dr. Kipp Brood talking w/patient.

## 2020-01-26 NOTE — ED Provider Notes (Signed)
74 year old female presents to the hospital from the cardiology office with a complaint of chest discomfort and shortness of breath.  She has had some progressive and almost constant symptoms now with even the smallest amount of exertion.  This was suggestive of a coronary obstruction and yesterday underwent CT scan imaging showing multivessel coronary disease.  When she followed up with the cardiology office today she was redirected to the emergency department due to the severity of her symptoms for an urgent heart catheterization.  Her EKG is unremarkable.  Her symptoms are persistent at this time she has a just a soft heaviness on her chest.  She was given aspirin by paramedics.  Heart and lung exams are unremarkable, no edema, she is well-appearing, she is not vomiting, vital signs are stable except for hypertension.  Will discuss with cardiology, we will start on heparin, will also apply Nitropaste  This patient is critically ill with unstable angina acute coronary syndrome most likely.  Will be admitted to the cardiology service.  ED ECG REPORT  I personally interpreted this EKG   Date: 01/27/2020   Rate: 72   Rhythm: normal sinus rhythm  QRS Axis: left  Intervals: normal  ST/T Wave abnormalities: nonspecific ST changes  Conduction Disutrbances:none  Narrative Interpretation:   Old EKG Reviewed: unchanged  CRITICAL CARE Performed by: Johnna Acosta Total critical care time: 35 minutes Critical care time was exclusive of separately billable procedures and treating other patients. Critical care was necessary to treat or prevent imminent or life-threatening deterioration. Critical care was time spent personally by me on the following activities: development of treatment plan with patient and/or surrogate as well as nursing, discussions with consultants, evaluation of patient's response to treatment, examination of patient, obtaining history from patient or surrogate, ordering and  performing treatments and interventions, ordering and review of laboratory studies, ordering and review of radiographic studies, pulse oximetry and re-evaluation of patient's condition.  Medical screening examination/treatment/procedure(s) were conducted as a shared visit with non-physician practitioner(s) and myself.  I personally evaluated the patient during the encounter.  Clinical Impression:   Final diagnoses:  Unstable angina (HCC)          Noemi Chapel, MD 01/27/20 1024

## 2020-01-26 NOTE — Progress Notes (Signed)
TCTS consulted for CABG evaluation. °

## 2020-01-27 ENCOUNTER — Inpatient Hospital Stay (HOSPITAL_COMMUNITY): Payer: Medicare Other

## 2020-01-27 DIAGNOSIS — T466X5A Adverse effect of antihyperlipidemic and antiarteriosclerotic drugs, initial encounter: Secondary | ICD-10-CM

## 2020-01-27 DIAGNOSIS — M609 Myositis, unspecified: Secondary | ICD-10-CM

## 2020-01-27 DIAGNOSIS — I200 Unstable angina: Secondary | ICD-10-CM

## 2020-01-27 DIAGNOSIS — R931 Abnormal findings on diagnostic imaging of heart and coronary circulation: Secondary | ICD-10-CM

## 2020-01-27 DIAGNOSIS — I2510 Atherosclerotic heart disease of native coronary artery without angina pectoris: Secondary | ICD-10-CM

## 2020-01-27 DIAGNOSIS — I10 Essential (primary) hypertension: Secondary | ICD-10-CM

## 2020-01-27 DIAGNOSIS — E782 Mixed hyperlipidemia: Secondary | ICD-10-CM

## 2020-01-27 LAB — ECHOCARDIOGRAM COMPLETE
Height: 61 in
Weight: 1913.59 oz

## 2020-01-27 LAB — CBC
HCT: 38.1 % (ref 36.0–46.0)
Hemoglobin: 11.2 g/dL — ABNORMAL LOW (ref 12.0–15.0)
MCH: 22.6 pg — ABNORMAL LOW (ref 26.0–34.0)
MCHC: 29.4 g/dL — ABNORMAL LOW (ref 30.0–36.0)
MCV: 76.8 fL — ABNORMAL LOW (ref 80.0–100.0)
Platelets: 279 10*3/uL (ref 150–400)
RBC: 4.96 MIL/uL (ref 3.87–5.11)
RDW: 15.9 % — ABNORMAL HIGH (ref 11.5–15.5)
WBC: 8.5 10*3/uL (ref 4.0–10.5)
nRBC: 0 % (ref 0.0–0.2)

## 2020-01-27 LAB — GLUCOSE, CAPILLARY
Glucose-Capillary: 152 mg/dL — ABNORMAL HIGH (ref 70–99)
Glucose-Capillary: 162 mg/dL — ABNORMAL HIGH (ref 70–99)
Glucose-Capillary: 160 mg/dL — ABNORMAL HIGH (ref 70–99)
Glucose-Capillary: 174 mg/dL — ABNORMAL HIGH (ref 70–99)

## 2020-01-27 LAB — LIPID PANEL
Cholesterol: 186 mg/dL (ref 0–200)
HDL: 33 mg/dL — ABNORMAL LOW (ref >40)
LDL Cholesterol: 78 mg/dL (ref 0–99)
Total CHOL/HDL Ratio: 5.6 RATIO
Triglycerides: 374 mg/dL — ABNORMAL HIGH (ref <150)
VLDL: 75 mg/dL — ABNORMAL HIGH (ref 0–40)

## 2020-01-27 LAB — HEPARIN LEVEL (UNFRACTIONATED)
Heparin Unfractionated: <0.1 IU/mL — ABNORMAL LOW (ref 0.30–0.70)
Heparin Unfractionated: 0.11 IU/mL — ABNORMAL LOW (ref 0.30–0.70)

## 2020-01-27 MED ORDER — AMLODIPINE BESYLATE 2.5 MG PO TABS
2.5000 mg | ORAL_TABLET | Freq: Every day | ORAL | Status: DC
  Administered 2020-01-27 – 2020-01-29 (×3): 2.5 mg via ORAL
  Filled 2020-01-27 (×3): qty 1

## 2020-01-27 NOTE — Progress Notes (Signed)
     [address]       [place] [address]             [phone]       Patient continues to have some chest pressure despite minimal nitroglycerin paste. Norvasc is been added.  If this persist will consider nitroglycerin drip. Echocardiogram reviewed.  LV and RV function.  No significant valvular disease.  Tentatively scheduled for CABG 7/13 or 7/15  Gertude Benito O Tabbetha Kutscher

## 2020-01-27 NOTE — Progress Notes (Signed)
Progress Note  Patient Name: Faith Gaines Date of Encounter: 01/27/2020  [facility] Cardiologist: Dr Percival Spanish  Subjective   She continues to have chest pressure and NTG gave her significant headaches.  Inpatient Medications    Scheduled Meds: . aspirin EC  81 mg Oral Daily  . insulin aspart  0-15 Units Subcutaneous TID WC  . insulin aspart  0-5 Units Subcutaneous QHS  . mupirocin ointment  1 application Nasal BID  . nebivolol  25 mg Oral Daily  . nitroGLYCERIN  0.5 inch Topical Q6H  . pantoprazole  40 mg Oral Daily  . sodium chloride flush  3 mL Intravenous Q12H  . sodium chloride flush  3 mL Intravenous Q12H   Continuous Infusions: . sodium chloride    . heparin 650 Units/hr (01/26/20 2248)   PRN Meds: sodium chloride, acetaminophen, diphenhydrAMINE, ondansetron (ZOFRAN) IV, sodium chloride flush   Vital Signs    Vitals:   01/26/20 1929 01/26/20 2341 01/27/20 0304 01/27/20 0848  BP: (!) 150/83 (!) 144/90 139/66   Pulse: 70 62 66   Resp: 17 16 15    Temp: 98 F (36.7 C) 97.8 F (36.6 C) 98 F (36.7 C) 98.2 F (36.8 C)  TempSrc: Oral Oral Oral Oral  SpO2: 98% 99% 98%   Weight:      Height:        Intake/Output Summary (Last 24 hours) at 01/27/2020 0850 Last data filed at 01/26/2020 1900 Gross per 24 hour  Intake 546.26 ml  Output --  Net 546.26 ml   Last 3 Weights 01/26/2020 01/26/2020 12/27/2019  Weight (lbs) 119 lb 9.6 oz 119 lb 9.6 oz 118 lb  Weight (kg) 54.25 kg 54.25 kg 53.524 kg      Telemetry    SR in 60' - Personally Reviewed  ECG    No new tracing - Personally Reviewed  Physical Exam   GEN: No acute distress.   Neck: No JVD Cardiac: RRR, no murmurs, rubs, or gallops.  Respiratory: Clear to auscultation bilaterally. GI: Soft, nontender, non-distended  MS: No edema; No deformity. Neuro:  Nonfocal  Psych: Normal affect   Labs    High Sensitivity Troponin:   Recent Labs  Lab 01/26/20 1411 01/26/20 1815  TROPONINIHS 4 6       Chemistry Recent Labs  Lab 01/26/20 1411  NA 142  K 5.0  CL 108  CO2 22  GLUCOSE 156*  BUN 11  CREATININE 0.96  CALCIUM 10.5*  PROT 7.2  ALBUMIN 4.3  AST 18  ALT 13  ALKPHOS 80  BILITOT 1.0  GFRNONAA 60*  GFRAA >60  ANIONGAP 12     Hematology Recent Labs  Lab 01/26/20 1411 01/27/20 0149  WBC 10.2 8.5  RBC 5.38* 4.96  HGB 12.5 11.2*  HCT 41.8 38.1  MCV 77.7* 76.8*  MCH 23.2* 22.6*  MCHC 29.9* 29.4*  RDW 16.1* 15.9*  PLT 296 279    BNPNo results for input(s): BNP, PROBNP in the last 168 hours.   DDimer No results for input(s): DDIMER in the last 168 hours.   Radiology    CARDIAC CATHETERIZATION  Result Date: 01/26/2020 Conclusions: 1. Severe three-vessel coronary artery disease, as outlined below. 2. Normal left ventricular systolic function and filling pressure. Recommendations: 1. Admit for cardiac surgery consultation for CABG (case discussed with Dr. Kipp Brood). 2. Medical therapy of chest pain, which is likely a combination of angina and non-cardiac chest pain. 3. Aggressive secondary prevention. 4. Obtain transthoracic echocardiogram. [name],  MD [facility]   CT CORONARY MORPH W/CTA COR W/SCORE W/CA W/CM &/OR WO/CM  Addendum Date: 01/25/2020   ADDENDUM REPORT: 01/25/2020 17:14 CLINICAL DATA:  Chest pain EXAM: Cardiac CTA MEDICATIONS: Sub lingual nitro. 4mg  x 2 TECHNIQUE: The patient was scanned on a Siemens 161 slice scanner. Gantry rotation speed was 250 msecs. Collimation was 0.6 mm. A 100 kV prospective scan was triggered in the ascending thoracic aorta at 35-75% of the R-R interval. Average HR during the scan was 60 bpm. The 3D data set was interpreted on a dedicated work station using MPR, MIP and VRT modes. A total of 80cc of contrast was used. FINDINGS: Non-cardiac: See separate report from [facility]. Pulmonary veins drain normally to the left atrium. Calcium Score: 1657 Agatston units. Coronary Arteries: Right dominant with no  anomalies LM: Calcified plaque with no significant stenosis. LAD system: Extensive ostial/proximal/mid LAD mixed plaque. Possible severe (70-90%) stenosis in the ostial/proximal LAD. Circumflex system: Extensive proximal/mid LCx mixed plaque. Possible severe (70-90%) stenosis in the proximal LCx. RCA system: Extensive mixed plaque in the proximal to mid RCA. Possible severe (70-90%) stenosis. IMPRESSION: 1. Coronary artery calcium score 1657 Agatston units. This places the patient in the 98th percentile for age and gender, suggesting high risk for future cardiac events. 2. There is extensive proximal to mid coronary plaque in all three major vessels. I am concerned for possible severe stenosis in each vessel. However, given possibility for blooming artifact with copious calcification, will need to send for FFR to confirm. Dalton Mclean Electronically Signed   By: Loralie Champagne M.D.   On: 01/25/2020 17:14   Result Date: 01/25/2020 EXAM: OVER-READ INTERPRETATION  CT CHEST The following report is an over-read performed by radiologist Dr. Vinnie Langton of [facility], [place] on 01/25/2020. This over-read does not include interpretation of cardiac or coronary anatomy or pathology. The coronary calcium score/coronary CTA interpretation by the cardiologist is attached. COMPARISON:  None. FINDINGS: Aortic atherosclerosis. Within the visualized portions of the thorax there are no suspicious appearing pulmonary nodules or masses, there is no acute consolidative airspace disease, no pleural effusions, no pneumothorax and no lymphadenopathy. Visualized portions of the upper abdomen are unremarkable. There are no aggressive appearing lytic or blastic lesions noted in the visualized portions of the skeleton. Postoperative changes of lumpectomy noted in the left breast. IMPRESSION: 1.  Aortic Atherosclerosis (ICD10-I70.0). Electronically Signed: By: Vinnie Langton M.D. On: 01/25/2020 13:55   CT CORONARY FRACTIONAL FLOW  RESERVE DATA PREP  Result Date: 01/26/2020 CLINICAL DATA:  Chest pain EXAM: CT FFR MEDICATIONS: No additional medications TECHNIQUE: The coronary CT was sent for FFR. FINDINGS: FFR 0.58 mid RCA FFR <0.5 distal LAD FFR 0.67 distal LCx IMPRESSION: Severe (hemodynamically significant) stenosis in the proximal RCA, mid LAD, and mid LCX. This suggests severe 3 vessel disease, will need cardiac cath. Dalton Mclean Electronically Signed   By: Loralie Champagne M.D.   On: 01/26/2020 17:36    Cardiac Studies   Cardiac cath 01/26/2020 Conclusions: 1. Severe three-vessel coronary artery disease, as outlined below. 2. Normal left ventricular systolic function and filling pressure.  Recommendations: 1. Admit for cardiac surgery consultation for CABG (case discussed with Dr. Kipp Brood). 2. Medical therapy of chest pain, which is likely a combination of angina and non-cardiac chest pain. 3. Aggressive secondary prevention. 4. Obtain transthoracic echocardiogram.  [name], MD   Patient Profile     74 y.o. female with 3 VD admitted for CABG  Assessment & Plan  CAD, 3 VD, scheduled for CABG for the next week with Dr Kipp Brood - continue ASA, nebivolol - she remains hypertensive and with  Chest tightness, NTG gave her bad headache - I will start amlodipine 2.5 mg po daily - she is allergic to ACEi, and atorvastatin, we will refer her to the lipid clinic post CABG for PCSK 9 inhibitors or other non-statin lipid lowering agents - continue iv heparin  Hypertension  - add amlodipine  HLP  - as above  DM - on insulin  For questions or updates, please contact [facility] Please consult [email or web address] for contact info under      Signed, Ena Dawley, MD  01/27/2020, 8:50 AM

## 2020-01-27 NOTE — Progress Notes (Signed)
  Echocardiogram 2D Echocardiogram has been performed.  Jennette Dubin 01/27/2020, 10:11 AM

## 2020-01-27 NOTE — Progress Notes (Signed)
ANTICOAGULATION CONSULT NOTE - Follow Up Consult  Pharmacy Consult for Heparin Indication: chest pain/ACS  Allergies  Allergen Reactions  . Codeine Nausea And Vomiting and Other (See Comments)    Headache and vomiting  . Ace Inhibitors     Drop in GFR  . Clarithromycin     Unable to sleep   . Crestor [Rosuvastatin]     Body aches  . Lipitor [Atorvastatin]     Body aches  . Avandamet [Rosiglitazone-Metformin] Rash and Other (See Comments)    Makes her hyper    Patient Measurements: Total Body Weight: 54.3 kg Height: 61 inches Heparin Dosing Weight: 54.3 kg  Vital Signs: Temp: 98.5 F (36.9 C) (07/10 1555) Temp Source: Oral (07/10 1555) BP: 145/91 (07/10 1555) Pulse Rate: 59 (07/10 1555)  Labs: Recent Labs    01/26/20 1411 01/26/20 1815 01/27/20 0149 01/27/20 0709 01/27/20 1551  HGB 12.5  --  11.2*  --   --   HCT 41.8  --  38.1  --   --   PLT 296  --  279  --   --   HEPARINUNFRC  --   --   --  <0.10* 0.11*  CREATININE 0.96  --   --   --   --   TROPONINIHS 4 6  --   --   --     Estimated Creatinine Clearance: 40.6 mL/min (by C-G formula based on SCr of 0.96 mg/dL).   Medical History: Past Medical History:  Diagnosis Date  . Anxiety    no meds  . Breast cancer ([place]) 2015   left breast   . CKD (chronic kidney disease), stage III    no meds, blood draws q 62mos by Dr. Alyson Ingles in [place]  . Depression    no meds  . Diabetes mellitus without complication ([place])    type 2  . GERD (gastroesophageal reflux disease)    occ  . Hyperlipidemia    diet controlled, no meds  . Hypertension   . Iron deficiency anemia   . Osteopenia   . Peripheral vascular disease ([place])    rt carotid stenosis  . Pneumonia 12/2011, 05/2012   hx x2 in 2013  . PONV (postoperative nausea and vomiting)   . Trigger finger    resolved with cortisone injections    Assessment: 74 yr old female who presented to ED with chest pain and is S/P cardiac cath with findings of severe  three-vessel disease; pt admitted for cardiac surgery consultation for CABG (CABG planned for next week).  Currently on IV heparin at 750 units/hr. HL this afternoon is subtherapeutic at 0.11. RN reports no s/s of overt bleeding   Goal of Therapy:  Heparin level 0.3-0.7 units/ml Monitor platelets by anticoagulation protocol: Yes   Plan:  Increase IV heparin to 900 units/hr and f/u 8 hr HL  Monitor daily heparin level, CBC Monitor for signs/symptoms of bleeding F/U plans for CABG  Albertina Parr, PharmD., BCPS, BCCCP Clinical Pharmacist Clinical phone for 01/27/20 until 10pm: [phone] If after 10pm, please refer to [facility] for unit-specific pharmacist

## 2020-01-27 NOTE — Plan of Care (Signed)
Problem: Education: Goal: Understanding of CV disease, CV risk reduction, and recovery process will improve 01/27/2020 0932 by Don Perking, RN Outcome: Progressing 01/26/2020 1937 by Don Perking, RN Outcome: Progressing Goal: Individualized Educational Video(s) 01/27/2020 0932 by Don Perking, RN Outcome: Progressing 01/26/2020 1937 by Don Perking, RN Outcome: Progressing   Problem: Activity: Goal: Ability to return to baseline activity level will improve 01/27/2020 0932 by Don Perking, RN Outcome: Progressing 01/26/2020 1937 by Don Perking, RN Outcome: Progressing   Problem: Cardiovascular: Goal: Ability to achieve and maintain adequate cardiovascular perfusion will improve 01/27/2020 0932 by Don Perking, RN Outcome: Progressing 01/26/2020 1937 by Don Perking, RN Outcome: Progressing Goal: Vascular access site(s) Level 0-1 will be maintained 01/27/2020 0932 by Don Perking, RN Outcome: Progressing 01/26/2020 1937 by Don Perking, RN Outcome: Progressing   Problem: Health Behavior/Discharge Planning: Goal: Ability to safely manage health-related needs after discharge will improve 01/27/2020 0932 by Don Perking, RN Outcome: Progressing 01/26/2020 1937 by Don Perking, RN Outcome: Progressing   Problem: Education: Goal: Knowledge of General Education information will improve Description: Including pain rating scale, medication(s)/side effects and non-pharmacologic comfort measures 01/27/2020 0932 by Don Perking, RN Outcome: Progressing 01/26/2020 1937 by Don Perking, RN Outcome: Progressing   Problem: Health Behavior/Discharge Planning: Goal: Ability to manage health-related needs will improve 01/27/2020 0932 by Don Perking, RN Outcome: Progressing 01/26/2020 1937 by Don Perking, RN Outcome: Progressing   Problem: Clinical Measurements: Goal: Ability to maintain clinical  measurements within normal limits will improve 01/27/2020 0932 by Don Perking, RN Outcome: Progressing 01/26/2020 1937 by Don Perking, RN Outcome: Progressing Goal: Will remain free from infection 01/27/2020 0932 by Don Perking, RN Outcome: Progressing 01/26/2020 1937 by Don Perking, RN Outcome: Progressing Goal: Diagnostic test results will improve 01/27/2020 0932 by Don Perking, RN Outcome: Progressing 01/26/2020 1937 by Don Perking, RN Outcome: Progressing Goal: Respiratory complications will improve 01/27/2020 0932 by Don Perking, RN Outcome: Progressing 01/26/2020 1937 by Don Perking, RN Outcome: Progressing Goal: Cardiovascular complication will be avoided 01/27/2020 0932 by Don Perking, RN Outcome: Progressing 01/26/2020 1937 by Don Perking, RN Outcome: Progressing   Problem: Activity: Goal: Risk for activity intolerance will decrease 01/27/2020 0932 by Don Perking, RN Outcome: Progressing 01/26/2020 1937 by Don Perking, RN Outcome: Progressing   Problem: Nutrition: Goal: Adequate nutrition will be maintained 01/27/2020 0932 by Don Perking, RN Outcome: Progressing 01/26/2020 1937 by Don Perking, RN Outcome: Progressing   Problem: Coping: Goal: Level of anxiety will decrease 01/27/2020 0932 by Don Perking, RN Outcome: Progressing 01/26/2020 1937 by Don Perking, RN Outcome: Progressing   Problem: Elimination: Goal: Will not experience complications related to bowel motility 01/27/2020 0932 by Don Perking, RN Outcome: Progressing 01/26/2020 1937 by Don Perking, RN Outcome: Progressing Goal: Will not experience complications related to urinary retention 01/27/2020 0932 by Don Perking, RN Outcome: Progressing 01/26/2020 1937 by Don Perking, RN Outcome: Progressing   Problem: Pain Managment: Goal: General experience of comfort will  improve 01/27/2020 0932 by Don Perking, RN Outcome: Progressing 01/26/2020 1937 by Don Perking, RN Outcome: Progressing   Problem: Safety: Goal: Ability to remain free from injury will improve 01/27/2020 0932 by Don Perking, RN Outcome: Progressing 01/26/2020 1937 by Don Perking, RN Outcome: Progressing   Problem: Skin Integrity: Goal: Risk for impaired skin  integrity will decrease 01/27/2020 0932 by Don Perking, RN Outcome: Progressing 01/26/2020 1937 by Don Perking, RN Outcome: Progressing

## 2020-01-28 DIAGNOSIS — Z789 Other specified health status: Secondary | ICD-10-CM

## 2020-01-28 LAB — CBC
HCT: 38.6 % (ref 36.0–46.0)
Hemoglobin: 11.7 g/dL — ABNORMAL LOW (ref 12.0–15.0)
MCH: 23.5 pg — ABNORMAL LOW (ref 26.0–34.0)
MCHC: 30.3 g/dL (ref 30.0–36.0)
MCV: 77.7 fL — ABNORMAL LOW (ref 80.0–100.0)
Platelets: 245 10*3/uL (ref 150–400)
RBC: 4.97 MIL/uL (ref 3.87–5.11)
RDW: 15.9 % — ABNORMAL HIGH (ref 11.5–15.5)
WBC: 8.8 10*3/uL (ref 4.0–10.5)
nRBC: 0 % (ref 0.0–0.2)

## 2020-01-28 LAB — HEPARIN LEVEL (UNFRACTIONATED)
Heparin Unfractionated: 0.29 IU/mL — ABNORMAL LOW (ref 0.30–0.70)
Heparin Unfractionated: 0.35 IU/mL (ref 0.30–0.70)

## 2020-01-28 LAB — GLUCOSE, CAPILLARY
Glucose-Capillary: 168 mg/dL — ABNORMAL HIGH (ref 70–99)
Glucose-Capillary: 169 mg/dL — ABNORMAL HIGH (ref 70–99)
Glucose-Capillary: 161 mg/dL — ABNORMAL HIGH (ref 70–99)
Glucose-Capillary: 236 mg/dL — ABNORMAL HIGH (ref 70–99)

## 2020-01-28 NOTE — Progress Notes (Signed)
ANTICOAGULATION CONSULT NOTE - Follow Up Consult  Pharmacy Consult for heparin Indication: CAD awaiting CABG  Labs: Recent Labs    01/26/20 1411 01/26/20 1411 01/26/20 1815 01/27/20 0149 01/27/20 0709 01/27/20 1551 01/28/20 0216  HGB 12.5   < >  --  11.2*  --   --  11.7*  HCT 41.8  --   --  38.1  --   --  38.6  PLT 296  --   --  279  --   --  245  HEPARINUNFRC  --   --   --   --  <0.10* 0.11* 0.29*  CREATININE 0.96  --   --   --   --   --   --   TROPONINIHS 4  --  6  --   --   --   --    < > = values in this interval not displayed.    Assessment: 74yo female slightly subtherapeutic on heparin after rate change.  Goal of Therapy:  Heparin level 0.3-0.7 units/ml   Plan:  Will increase heparin gtt slightly to 950 units/hr and check level in 8 hours.    Wynona Neat, PharmD, BCPS  01/28/2020,2:57 AM

## 2020-01-28 NOTE — Progress Notes (Signed)
Progress Note  Patient Name: Tekisha Darcey Camus Date of Encounter: 01/28/2020  [facility] Cardiologist: Dr Percival Spanish  Subjective   She feels much better today, her chest tightness has improved significantly, she was walking around without difficulties.  Inpatient Medications    Scheduled Meds: . amLODipine  2.5 mg Oral Daily  . aspirin EC  81 mg Oral Daily  . insulin aspart  0-15 Units Subcutaneous TID WC  . insulin aspart  0-5 Units Subcutaneous QHS  . mupirocin ointment  1 application Nasal BID  . nebivolol  25 mg Oral Daily  . pantoprazole  40 mg Oral Daily  . sodium chloride flush  3 mL Intravenous Q12H  . sodium chloride flush  3 mL Intravenous Q12H   Continuous Infusions: . sodium chloride    . heparin 950 Units/hr (01/28/20 0731)   PRN Meds: sodium chloride, acetaminophen, diphenhydrAMINE, ondansetron (ZOFRAN) IV, sodium chloride flush   Vital Signs    Vitals:   01/27/20 2009 01/27/20 2308 01/28/20 0335 01/28/20 0732  BP: (!) 155/61 (!) 159/69 139/68 136/63  Pulse: 62 (!) 59 60 70  Resp: 20 14 19 16   Temp: 98.2 F (36.8 C) 97.8 F (36.6 C) 97.6 F (36.4 C) 97.9 F (36.6 C)  TempSrc: Oral Oral Oral Oral  SpO2: 95% 100% 95% 97%  Weight:      Height:        Intake/Output Summary (Last 24 hours) at 01/28/2020 0826 Last data filed at 01/28/2020 0345 Gross per 24 hour  Intake 1251 ml  Output --  Net 1251 ml   Last 3 Weights 01/26/2020 01/26/2020 12/27/2019  Weight (lbs) 119 lb 9.6 oz 119 lb 9.6 oz 118 lb  Weight (kg) 54.25 kg 54.25 kg 53.524 kg      Telemetry    SR in 60' - Personally Reviewed  ECG    No new tracing - Personally Reviewed  Physical Exam   GEN: No acute distress.   Neck: No JVD Cardiac: RRR, no murmurs, rubs, or gallops.  Respiratory: Clear to auscultation bilaterally. GI: Soft, nontender, non-distended  MS: No edema; No deformity. Neuro:  Nonfocal  Psych: Normal affect   Labs    High Sensitivity Troponin:   Recent  Labs  Lab 01/26/20 1411 01/26/20 1815  TROPONINIHS 4 6      Chemistry Recent Labs  Lab 01/26/20 1411  NA 142  K 5.0  CL 108  CO2 22  GLUCOSE 156*  BUN 11  CREATININE 0.96  CALCIUM 10.5*  PROT 7.2  ALBUMIN 4.3  AST 18  ALT 13  ALKPHOS 80  BILITOT 1.0  GFRNONAA 60*  GFRAA >60  ANIONGAP 12     Hematology Recent Labs  Lab 01/26/20 1411 01/27/20 0149 01/28/20 0216  WBC 10.2 8.5 8.8  RBC 5.38* 4.96 4.97  HGB 12.5 11.2* 11.7*  HCT 41.8 38.1 38.6  MCV 77.7* 76.8* 77.7*  MCH 23.2* 22.6* 23.5*  MCHC 29.9* 29.4* 30.3  RDW 16.1* 15.9* 15.9*  PLT 296 279 245    BNPNo results for input(s): BNP, PROBNP in the last 168 hours.   DDimer No results for input(s): DDIMER in the last 168 hours.   Radiology    CARDIAC CATHETERIZATION  Result Date: 01/26/2020 Conclusions: 1. Severe three-vessel coronary artery disease, as outlined below. 2. Normal left ventricular systolic function and filling pressure. Recommendations: 1. Admit for cardiac surgery consultation for CABG (case discussed with Dr. Kipp Brood). 2. Medical therapy of chest pain, which is likely a  combination of angina and non-cardiac chest pain. 3. Aggressive secondary prevention. 4. Obtain transthoracic echocardiogram. [name], MD [facility]   ECHOCARDIOGRAM COMPLETE  Result Date: 01/27/2020    ECHOCARDIOGRAM REPORT   Patient Name:   PAT ELICKER Date of Exam: 01/27/2020 Medical Rec #:  [id number]            Height:       61.0 in Accession #:    [id number]           Weight:       119.6 lb Date of Birth:  03/13/1946           BSA:          1.518 m Patient Age:    68 years             BP:           140/69 mmHg Patient Gender: F                    HR:           62 bpm. Exam Location:  Inpatient Procedure: 2D Echo Indications:    CAD Native Vessel I25.10  History:        Patient has no prior history of Echocardiogram examinations.                 Risk Factors:Dyslipidemia, Diabetes and Hypertension.   Sonographer:    Mikki Santee RDCS (AE) Referring Phys: [phone] [facility] END IMPRESSIONS  1. Left ventricular ejection fraction, by estimation, is 60 to 65%. The left ventricle has normal function. The left ventricle has no regional wall motion abnormalities. Left ventricular diastolic parameters were normal.  2. Right ventricular systolic function is normal. The right ventricular size is normal. There is normal pulmonary artery systolic pressure.  3. The mitral valve is normal in structure. Trivial mitral valve regurgitation. No evidence of mitral stenosis.  4. The aortic valve is tricuspid. Aortic valve regurgitation is not visualized. Mild aortic valve sclerosis is present, with no evidence of aortic valve stenosis.  5. The inferior vena cava is normal in size with greater than 50% respiratory variability, suggesting right atrial pressure of 3 mmHg. FINDINGS  Left Ventricle: Left ventricular ejection fraction, by estimation, is 60 to 65%. The left ventricle has normal function. The left ventricle has no regional wall motion abnormalities. The left ventricular internal cavity size was normal in size. There is  no left ventricular hypertrophy. Left ventricular diastolic parameters were normal. Right Ventricle: The right ventricular size is normal. No increase in right ventricular wall thickness. Right ventricular systolic function is normal. There is normal pulmonary artery systolic pressure. The tricuspid regurgitant velocity is 1.66 m/s, and  with an assumed right atrial pressure of 3 mmHg, the estimated right ventricular systolic pressure is 71.0 mmHg. Left Atrium: Left atrial size was normal in size. Right Atrium: Right atrial size was normal in size. Pericardium: Trivial pericardial effusion is present. The pericardial effusion is posterior to the left ventricle. Mitral Valve: The mitral valve is normal in structure. There is mild thickening of the mitral valve leaflet(s). Normal mobility of the mitral  valve leaflets. Trivial mitral valve regurgitation. No evidence of mitral valve stenosis. Tricuspid Valve: The tricuspid valve is normal in structure. Tricuspid valve regurgitation is trivial. No evidence of tricuspid stenosis. Aortic Valve: The aortic valve is tricuspid. Aortic valve regurgitation is not visualized. Mild aortic valve sclerosis is present, with no evidence of aortic valve  stenosis. Pulmonic Valve: The pulmonic valve was normal in structure. Pulmonic valve regurgitation is not visualized. No evidence of pulmonic stenosis. Aorta: The aortic root is normal in size and structure. Venous: The inferior vena cava is normal in size with greater than 50% respiratory variability, suggesting right atrial pressure of 3 mmHg. IAS/Shunts: No atrial level shunt detected by color flow Doppler.  LEFT VENTRICLE PLAX 2D LVIDd:         3.70 cm  Diastology LVIDs:         2.30 cm  LV e' lateral:   6.09 cm/s LV PW:         0.90 cm  LV E/e' lateral: 10.4 LV IVS:        0.90 cm  LV e' medial:    4.46 cm/s LVOT diam:     2.10 cm  LV E/e' medial:  14.3 LV SV:         70 LV SV Index:   46 LVOT Area:     3.46 cm  RIGHT VENTRICLE RV S prime:     11.70 cm/s TAPSE (M-mode): 1.6 cm LEFT ATRIUM             Index       RIGHT ATRIUM           Index LA diam:        3.00 cm 1.98 cm/m  RA Area:     10.50 cm LA Vol (A2C):   30.0 ml 19.76 ml/m RA Volume:   16.70 ml  11.00 ml/m LA Vol (A4C):   28.8 ml 18.97 ml/m LA Biplane Vol: 29.4 ml 19.37 ml/m  AORTIC VALVE LVOT Vmax:   78.30 cm/s LVOT Vmean:  54.200 cm/s LVOT VTI:    0.203 m  AORTA Ao Root diam: 3.20 cm MITRAL VALVE               TRICUSPID VALVE MV Area (PHT): 2.54 cm    TR Peak grad:   11.0 mmHg MV Decel Time: 299 msec    TR Vmax:        166.00 cm/s MV E velocity: 63.60 cm/s MV A velocity: 82.50 cm/s  SHUNTS MV E/A ratio:  0.77        Systemic VTI:  0.20 m                            Systemic Diam: 2.10 cm Jenkins Rouge MD Electronically signed by Jenkins Rouge MD Signature  Date/Time: 01/27/2020/10:52:28 AM    Final     Cardiac Studies   Cardiac cath 01/26/2020 Conclusions: 1. Severe three-vessel coronary artery disease, as outlined below. 2. Normal left ventricular systolic function and filling pressure.  Recommendations: 1. Admit for cardiac surgery consultation for CABG (case discussed with Dr. Kipp Brood). 2. Medical therapy of chest pain, which is likely a combination of angina and non-cardiac chest pain. 3. Aggressive secondary prevention. 4. Obtain transthoracic echocardiogram.  [name], MD   Patient Profile     74 y.o. female with 3 VD admitted for CABG  Assessment & Plan    CAD, 3 VD, scheduled for CABG for the next week with Dr Kipp Brood - continue ASA, nebivolol - BP has improved with amlodipine, chest tightness is almost resolved - she is allergic to ACEi, and atorvastatin, we will refer her to the lipid clinic post CABG for PCSK 9 inhibitors or other non-statin lipid lowering agents - continue iv heparin  Hypertension  -  added amlodipine - NTG gave her significant headaches and was discontinued  HLP  - as above  DM - on insulin  For questions or updates, please contact [place] Please consult [email or web address] for contact info under     Signed, Ena Dawley, MD  01/28/2020, 8:26 AM

## 2020-01-28 NOTE — Progress Notes (Addendum)
ANTICOAGULATION CONSULT NOTE - Follow Up Consult  Pharmacy Consult for heparin Indication:  CAD awaiting CABG  Labs: Recent Labs    01/26/20 1411 01/26/20 1411 01/26/20 1815 01/27/20 0149 01/27/20 0709 01/27/20 1551 01/28/20 0216 01/28/20 1024  HGB 12.5   < >  --  11.2*  --   --  11.7*  --   HCT 41.8  --   --  38.1  --   --  38.6  --   PLT 296  --   --  279  --   --  245  --   HEPARINUNFRC  --   --   --   --    < > 0.11* 0.29* 0.35  CREATININE 0.96  --   --   --   --   --   --   --   TROPONINIHS 4  --  6  --   --   --   --   --    < > = values in this interval not displayed.    Assessment: 74 yr old female who presented to ED with chest pain and is S/P cardiac cath with findings of severe three-vessel disease; pt admitted for cardiac surgery consultation for CABG (CABG planned for next week).   Therapeutic (HL 0.35) on heparin after rate change.  Goal of Therapy:  Heparin level 0.3-0.7 units/ml   Plan:  Will continue heparin gtt at950 units/hr  Daily heparin level and CBC  Claudina Lick, PharmD PGY1 Acute Care Pharmacy Resident 01/28/2020 11:45 AM  Please check [email or web address] for unit-specific pharmacy phone numbers.

## 2020-01-28 NOTE — Plan of Care (Signed)

## 2020-01-29 ENCOUNTER — Encounter (HOSPITAL_COMMUNITY): Payer: Self-pay | Admitting: Internal Medicine

## 2020-01-29 ENCOUNTER — Inpatient Hospital Stay (HOSPITAL_COMMUNITY): Payer: Medicare Other

## 2020-01-29 DIAGNOSIS — Z01810 Encounter for preprocedural cardiovascular examination: Secondary | ICD-10-CM

## 2020-01-29 LAB — CBC
HCT: 38.2 % (ref 36.0–46.0)
Hemoglobin: 11.3 g/dL — ABNORMAL LOW (ref 12.0–15.0)
MCH: 22.6 pg — ABNORMAL LOW (ref 26.0–34.0)
MCHC: 29.6 g/dL — ABNORMAL LOW (ref 30.0–36.0)
MCV: 76.6 fL — ABNORMAL LOW (ref 80.0–100.0)
Platelets: 232 10*3/uL (ref 150–400)
RBC: 4.99 MIL/uL (ref 3.87–5.11)
RDW: 15.6 % — ABNORMAL HIGH (ref 11.5–15.5)
WBC: 8.8 10*3/uL (ref 4.0–10.5)
nRBC: 0 % (ref 0.0–0.2)

## 2020-01-29 LAB — GLUCOSE, CAPILLARY
Glucose-Capillary: 164 mg/dL — ABNORMAL HIGH (ref 70–99)
Glucose-Capillary: 191 mg/dL — ABNORMAL HIGH (ref 70–99)
Glucose-Capillary: 213 mg/dL — ABNORMAL HIGH (ref 70–99)
Glucose-Capillary: 196 mg/dL — ABNORMAL HIGH (ref 70–99)

## 2020-01-29 LAB — HEPARIN LEVEL (UNFRACTIONATED): Heparin Unfractionated: 0.66 IU/mL (ref 0.30–0.70)

## 2020-01-29 MED ORDER — AMLODIPINE BESYLATE 5 MG PO TABS
5.0000 mg | ORAL_TABLET | Freq: Every day | ORAL | Status: DC
  Administered 2020-01-30 – 2020-02-01 (×3): 5 mg via ORAL
  Filled 2020-01-29 (×3): qty 1

## 2020-01-29 MED ORDER — NITROGLYCERIN IN D5W 200-5 MCG/ML-% IV SOLN
10.0000 ug/min | INTRAVENOUS | Status: DC
  Administered 2020-01-29: 10 ug/min via INTRAVENOUS
  Filled 2020-01-29: qty 250

## 2020-01-29 MED ORDER — LIP MEDEX EX OINT
TOPICAL_OINTMENT | CUTANEOUS | Status: DC | PRN
  Filled 2020-01-29: qty 7

## 2020-01-29 NOTE — Progress Notes (Signed)
VASCULAR LAB    Pre CABG Dopplers completed.    Preliminary report:  See CV proc for preliminary results.  Livianna Petraglia, RVT 01/29/2020, 11:26 AM

## 2020-01-29 NOTE — Progress Notes (Signed)
Notified by nurse for patient having recurrent 5/10 chest discomfort which the patient felt was stirred up by the anxiety of speaking with cardiac rehab. Session now complete. EKG shows NSR with nonspecific STT changes similar to prior, without acute change. D/w Dr. Angelena Form - he had offered IV NTG earlier for patient but she had not wished to start - will re-offer a trial of low dose NTG to start and follow symptoms. Willis Kuipers PA-C

## 2020-01-29 NOTE — Progress Notes (Signed)
ANTICOAGULATION CONSULT NOTE - Follow Up Consult  Pharmacy Consult for heparin Indication:  CAD awaiting CABG  Labs: Recent Labs    01/26/20 1411 01/26/20 1411 01/26/20 1815 01/27/20 0149 01/27/20 0709 01/28/20 0216 01/28/20 1024 01/29/20 0242  HGB 12.5   < >  --  11.2*  --  11.7*  --  11.3*  HCT 41.8   < >  --  38.1  --  38.6  --  38.2  PLT 296   < >  --  279  --  245  --  232  HEPARINUNFRC  --   --   --   --    < > 0.29* 0.35 0.66  CREATININE 0.96  --   --   --   --   --   --   --   TROPONINIHS 4  --  6  --   --   --   --   --    < > = values in this interval not displayed.    Assessment: 74 yr old female who presented to ED with chest pain and is S/P cardiac cath with findings of severe three-vessel disease; pt admitted for cardiac surgery consultation for CABG (CABG planned for next week).   Heparin level remains therapeutic, CBC stable.  Goal of Therapy:  Heparin level 0.3-0.7 units/ml   Plan:  -Continue heparin 950 units/h -Daily heparin level and CBC   Arrie Senate, PharmD, BCPS Clinical Pharmacist [phone] Please check AMION for all [facility] Pharmacy numbers 01/29/2020

## 2020-01-29 NOTE — Progress Notes (Signed)
Progress Note  Patient Name: Faith Gaines Date of Encounter: 01/29/2020  [facility] Cardiologist: Dr Percival Spanish  Subjective   Mild chest pain this am. No dyspnea.   Inpatient Medications    Scheduled Meds: . amLODipine  2.5 mg Oral Daily  . aspirin EC  81 mg Oral Daily  . insulin aspart  0-15 Units Subcutaneous TID WC  . insulin aspart  0-5 Units Subcutaneous QHS  . mupirocin ointment  1 application Nasal BID  . nebivolol  25 mg Oral Daily  . pantoprazole  40 mg Oral Daily  . sodium chloride flush  3 mL Intravenous Q12H  . sodium chloride flush  3 mL Intravenous Q12H   Continuous Infusions: . sodium chloride    . heparin 950 Units/hr (01/28/20 0731)   PRN Meds: sodium chloride, acetaminophen, diphenhydrAMINE, lip balm, ondansetron (ZOFRAN) IV, sodium chloride flush   Vital Signs    Vitals:   01/28/20 1932 01/28/20 2328 01/29/20 0555 01/29/20 0748  BP: (!) 153/61 (!) 143/70 (!) 147/67 (!) 134/58  Pulse: 70 60 60 62  Resp: 14 17 19 18   Temp: 97.9 F (36.6 C) (!) 97.5 F (36.4 C) 97.8 F (36.6 C) 97.9 F (36.6 C)  TempSrc: Oral Oral Oral Oral  SpO2: 100% 100% 99% 98%  Weight:      Height:       No intake or output data in the 24 hours ending 01/29/20 0900 Last 3 Weights 01/26/2020 01/26/2020 12/27/2019  Weight (lbs) 119 lb 9.6 oz 119 lb 9.6 oz 118 lb  Weight (kg) 54.25 kg 54.25 kg 53.524 kg      Telemetry    Sinus - Personally Reviewed  ECG    No new tracing - Personally Reviewed  Physical Exam    General: Well developed, well nourished, NAD  SKIN: warm, dry. No rashes. Neuro: No focal deficits  Psychiatric: Mood and affect normal  Neck: No JVD  Lungs:Clear bilaterally, no wheezes, rhonci, crackles Cardiovascular: Regular rate and rhythm. No murmurs, gallops or rubs. Abdomen:Soft. Bowel sounds present. Non-tender.  Extremities: No lower extremity edema.     Labs    High Sensitivity Troponin:   Recent Labs  Lab 01/26/20 1411  01/26/20 1815  TROPONINIHS 4 6      Chemistry Recent Labs  Lab 01/26/20 1411  NA 142  K 5.0  CL 108  CO2 22  GLUCOSE 156*  BUN 11  CREATININE 0.96  CALCIUM 10.5*  PROT 7.2  ALBUMIN 4.3  AST 18  ALT 13  ALKPHOS 80  BILITOT 1.0  GFRNONAA 60*  GFRAA >60  ANIONGAP 12     Hematology Recent Labs  Lab 01/27/20 0149 01/28/20 0216 01/29/20 0242  WBC 8.5 8.8 8.8  RBC 4.96 4.97 4.99  HGB 11.2* 11.7* 11.3*  HCT 38.1 38.6 38.2  MCV 76.8* 77.7* 76.6*  MCH 22.6* 23.5* 22.6*  MCHC 29.4* 30.3 29.6*  RDW 15.9* 15.9* 15.6*  PLT 279 245 232    BNPNo results for input(s): BNP, PROBNP in the last 168 hours.   DDimer No results for input(s): DDIMER in the last 168 hours.   Radiology    ECHOCARDIOGRAM COMPLETE  Result Date: 01/27/2020    ECHOCARDIOGRAM REPORT   Patient Name:   FUJIE DICKISON Date of Exam: 01/27/2020 Medical Rec #:  [id number]            Height:       61.0 in Accession #:    [id number]  Weight:       119.6 lb Date of Birth:  1945/07/28           BSA:          1.518 m Patient Age:    63 years             BP:           140/69 mmHg Patient Gender: F                    HR:           62 bpm. Exam Location:  Inpatient Procedure: 2D Echo Indications:    CAD Native Vessel I25.10  History:        Patient has no prior history of Echocardiogram examinations.                 Risk Factors:Dyslipidemia, Diabetes and Hypertension.  Sonographer:    Mikki Santee RDCS (AE) Referring Phys: [phone] [place] END IMPRESSIONS  1. Left ventricular ejection fraction, by estimation, is 60 to 65%. The left ventricle has normal function. The left ventricle has no regional wall motion abnormalities. Left ventricular diastolic parameters were normal.  2. Right ventricular systolic function is normal. The right ventricular size is normal. There is normal pulmonary artery systolic pressure.  3. The mitral valve is normal in structure. Trivial mitral valve regurgitation. No evidence of  mitral stenosis.  4. The aortic valve is tricuspid. Aortic valve regurgitation is not visualized. Mild aortic valve sclerosis is present, with no evidence of aortic valve stenosis.  5. The inferior vena cava is normal in size with greater than 50% respiratory variability, suggesting right atrial pressure of 3 mmHg. FINDINGS  Left Ventricle: Left ventricular ejection fraction, by estimation, is 60 to 65%. The left ventricle has normal function. The left ventricle has no regional wall motion abnormalities. The left ventricular internal cavity size was normal in size. There is  no left ventricular hypertrophy. Left ventricular diastolic parameters were normal. Right Ventricle: The right ventricular size is normal. No increase in right ventricular wall thickness. Right ventricular systolic function is normal. There is normal pulmonary artery systolic pressure. The tricuspid regurgitant velocity is 1.66 m/s, and  with an assumed right atrial pressure of 3 mmHg, the estimated right ventricular systolic pressure is 23.5 mmHg. Left Atrium: Left atrial size was normal in size. Right Atrium: Right atrial size was normal in size. Pericardium: Trivial pericardial effusion is present. The pericardial effusion is posterior to the left ventricle. Mitral Valve: The mitral valve is normal in structure. There is mild thickening of the mitral valve leaflet(s). Normal mobility of the mitral valve leaflets. Trivial mitral valve regurgitation. No evidence of mitral valve stenosis. Tricuspid Valve: The tricuspid valve is normal in structure. Tricuspid valve regurgitation is trivial. No evidence of tricuspid stenosis. Aortic Valve: The aortic valve is tricuspid. Aortic valve regurgitation is not visualized. Mild aortic valve sclerosis is present, with no evidence of aortic valve stenosis. Pulmonic Valve: The pulmonic valve was normal in structure. Pulmonic valve regurgitation is not visualized. No evidence of pulmonic stenosis. Aorta: The  aortic root is normal in size and structure. Venous: The inferior vena cava is normal in size with greater than 50% respiratory variability, suggesting right atrial pressure of 3 mmHg. IAS/Shunts: No atrial level shunt detected by color flow Doppler.  LEFT VENTRICLE PLAX 2D LVIDd:         3.70 cm  Diastology LVIDs:  2.30 cm  LV e' lateral:   6.09 cm/s LV PW:         0.90 cm  LV E/e' lateral: 10.4 LV IVS:        0.90 cm  LV e' medial:    4.46 cm/s LVOT diam:     2.10 cm  LV E/e' medial:  14.3 LV SV:         70 LV SV Index:   46 LVOT Area:     3.46 cm  RIGHT VENTRICLE RV S prime:     11.70 cm/s TAPSE (M-mode): 1.6 cm LEFT ATRIUM             Index       RIGHT ATRIUM           Index LA diam:        3.00 cm 1.98 cm/m  RA Area:     10.50 cm LA Vol (A2C):   30.0 ml 19.76 ml/m RA Volume:   16.70 ml  11.00 ml/m LA Vol (A4C):   28.8 ml 18.97 ml/m LA Biplane Vol: 29.4 ml 19.37 ml/m  AORTIC VALVE LVOT Vmax:   78.30 cm/s LVOT Vmean:  54.200 cm/s LVOT VTI:    0.203 m  AORTA Ao Root diam: 3.20 cm MITRAL VALVE               TRICUSPID VALVE MV Area (PHT): 2.54 cm    TR Peak grad:   11.0 mmHg MV Decel Time: 299 msec    TR Vmax:        166.00 cm/s MV E velocity: 63.60 cm/s MV A velocity: 82.50 cm/s  SHUNTS MV E/A ratio:  0.77        Systemic VTI:  0.20 m                            Systemic Diam: 2.10 cm Jenkins Rouge MD Electronically signed by Jenkins Rouge MD Signature Date/Time: 01/27/2020/10:52:28 AM    Final     Cardiac Studies   Cardiac cath 01/26/2020 Conclusions: 1. Severe three-vessel coronary artery disease, as outlined below. 2. Normal left ventricular systolic function and filling pressure.  Recommendations: 1. Admit for cardiac surgery consultation for CABG (case discussed with Dr. Kipp Brood). 2. Medical therapy of chest pain, which is likely a combination of angina and non-cardiac chest pain. 3. Aggressive secondary prevention. 4. Obtain transthoracic echocardiogram.  [name],  MD   Patient Profile     74 y.o. female with history of DM, HLD admitted with chest pain and found to have severe three vessel CAD on cath 01/26/20. She is awaiting CABG this week.   Assessment & Plan    1. CAD with unstable angina: She is having mild chest pain this am. She does not wish to start IV NTG yet as the pain is mild. Continue ASA, beta blocker, amlodipine and IV heparin. She does not tolerate statins or Ace-inhibitors.   2. HTN: BP mostly controlled. Continue current therapy. Will increase Norvasc to 5 mg daily.   3. Hyperlipidemia: Statin intolerant. Will need to consider a PCSK9 inh after her bypass surgery  4. DM: Continue current therapy  For questions or updates, please contact [place] Please consult [email or web address] for contact info under     Signed, Lauree Chandler, MD  01/29/2020, 9:00 AM

## 2020-01-29 NOTE — Progress Notes (Signed)
CARDIAC REHAB PHASE I   Preop education completed with pt and son. Pt with IS at bedside, demonstrating ~1100. Reviewed importance of IS use, walks, and sternal precautions post-op. Pt given cardiac surgery booklet, in-the-tube sheet, and OHS care guide. Pt states some anxiety, provided support and encouragement. Pt denies further questions or concerns at this. Pt with 5/10 upper back pain, RN made aware. Will hold ambulation at this time. Will continue to follow throughout hospital stay.   [id number] Rufina Falco, RN BSN 01/29/2020 2:46 PM

## 2020-01-29 NOTE — Progress Notes (Addendum)
      [address]       [place] [address]             [phone]       Discussed CABG with the patient and went over doppler results. Updated Ms. Rhines and family at the bedside about OR time Thursday afternoon. She was started on IV nitro today for 5/10 chest pain during cardiac rehab. I informed her that she is first on the list if a case were to cancel and we would take her to the OR sooner if her status became emergent.   All questions answered to the patient and family's satisfaction. Dr. Kipp Brood also currently answering further questions and concerns.    Nicholes Rough, PA-C  Agree with above  Lajuana Matte

## 2020-01-30 LAB — BASIC METABOLIC PANEL
Anion gap: 10 (ref 5–15)
BUN: 10 mg/dL (ref 8–23)
CO2: 22 mmol/L (ref 22–32)
Calcium: 10.4 mg/dL — ABNORMAL HIGH (ref 8.9–10.3)
Chloride: 110 mmol/L (ref 98–111)
Creatinine, Ser: 0.88 mg/dL (ref 0.44–1.00)
GFR calc Af Amer: >60 mL/min (ref >60)
GFR calc non Af Amer: >60 mL/min (ref >60)
Glucose, Bld: 173 mg/dL — ABNORMAL HIGH (ref 70–99)
Potassium: 4.3 mmol/L (ref 3.5–5.1)
Sodium: 142 mmol/L (ref 135–145)

## 2020-01-30 LAB — CBC
HCT: 36.4 % (ref 36.0–46.0)
Hemoglobin: 10.7 g/dL — ABNORMAL LOW (ref 12.0–15.0)
MCH: 22.6 pg — ABNORMAL LOW (ref 26.0–34.0)
MCHC: 29.4 g/dL — ABNORMAL LOW (ref 30.0–36.0)
MCV: 76.8 fL — ABNORMAL LOW (ref 80.0–100.0)
Platelets: 230 10*3/uL (ref 150–400)
RBC: 4.74 MIL/uL (ref 3.87–5.11)
RDW: 15.7 % — ABNORMAL HIGH (ref 11.5–15.5)
WBC: 9 10*3/uL (ref 4.0–10.5)
nRBC: 0 % (ref 0.0–0.2)

## 2020-01-30 LAB — HEPARIN LEVEL (UNFRACTIONATED): Heparin Unfractionated: 0.53 IU/mL (ref 0.30–0.70)

## 2020-01-30 LAB — GLUCOSE, CAPILLARY
Glucose-Capillary: 183 mg/dL — ABNORMAL HIGH (ref 70–99)
Glucose-Capillary: 215 mg/dL — ABNORMAL HIGH (ref 70–99)
Glucose-Capillary: 220 mg/dL — ABNORMAL HIGH (ref 70–99)
Glucose-Capillary: 178 mg/dL — ABNORMAL HIGH (ref 70–99)

## 2020-01-30 NOTE — Progress Notes (Addendum)
      [address]       [place] [address]             [phone]      No new complaints.  Continue to have chest pain, but states it has improved on the NTG drip.  She remains hopeful her surgery will get moved up as she thinks the waiting is making her more stressed and anxious.   For CABG Thursday afternoon with Dr. Gomez Cleverly, PA-C   Agree with above Doing well. OR on Thursday  Faith Gaines

## 2020-01-30 NOTE — Progress Notes (Signed)
ANTICOAGULATION CONSULT NOTE - Follow Up Consult  Pharmacy Consult for heparin Indication:  CAD awaiting CABG  Labs: Recent Labs    01/28/20 0216 01/28/20 0216 01/28/20 1024 01/29/20 0242 01/30/20 0255  HGB 11.7*   < >  --  11.3* 10.7*  HCT 38.6  --   --  38.2 36.4  PLT 245  --   --  232 230  HEPARINUNFRC 0.29*   < > 0.35 0.66 0.53  CREATININE  --   --   --   --  0.88   < > = values in this interval not displayed.    Assessment: 74 yr old female who presented to ED with chest pain and is S/P cardiac cath with findings of severe three-vessel disease; pt admitted for cardiac surgery consultation for CABG (CABG planned for next week).   Heparin level remains therapeutic at 0.53, CBC stable.  Goal of Therapy:  Heparin level 0.3-0.7 units/ml   Plan:  -Continue heparin 950 units/h -Daily heparin level and CBC   Arrie Senate, PharmD, BCPS Clinical Pharmacist [phone] Please check AMION for all [place] numbers 01/30/2020

## 2020-01-30 NOTE — Progress Notes (Signed)
CARDIAC REHAB PHASE I   Offered to walk with pt, pt states 2/10 chest discomfort, deferring ambulation at this time. Questions and concerns addressed, provided support and encouragement. Will continue to follow.  [phone] Rufina Falco, RN BSN 01/30/2020 2:41 PM

## 2020-01-30 NOTE — Progress Notes (Signed)
Progress Note  Patient Name: Faith Gaines Date of Encounter: 01/30/2020  [facility] Cardiologist: Dr Percival Spanish  Subjective   Still with mild chest pressure.   Inpatient Medications    Scheduled Meds: . amLODipine  5 mg Oral Daily  . aspirin EC  81 mg Oral Daily  . insulin aspart  0-15 Units Subcutaneous TID WC  . insulin aspart  0-5 Units Subcutaneous QHS  . mupirocin ointment  1 application Nasal BID  . nebivolol  25 mg Oral Daily  . pantoprazole  40 mg Oral Daily  . sodium chloride flush  3 mL Intravenous Q12H  . sodium chloride flush  3 mL Intravenous Q12H   Continuous Infusions: . sodium chloride    . heparin 950 Units/hr (01/29/20 0903)  . nitroGLYCERIN 10 mcg/min (01/29/20 1525)   PRN Meds: sodium chloride, acetaminophen, diphenhydrAMINE, lip balm, ondansetron (ZOFRAN) IV, sodium chloride flush   Vital Signs    Vitals:   01/30/20 0339 01/30/20 0400 01/30/20 0739 01/30/20 0830  BP:  (!) 125/54 102/61 (!) 143/64  Pulse:   67 66  Resp:  15 15 15   Temp: 97.8 F (36.6 C)  97.7 F (36.5 C)   TempSrc: Oral  Oral   SpO2:  98% 100% 97%  Weight:      Height:        Intake/Output Summary (Last 24 hours) at 01/30/2020 1016 Last data filed at 01/29/2020 1941 Gross per 24 hour  Intake 393.54 ml  Output --  Net 393.54 ml   Last 3 Weights 01/26/2020 01/26/2020 12/27/2019  Weight (lbs) 119 lb 9.6 oz 119 lb 9.6 oz 118 lb  Weight (kg) 54.25 kg 54.25 kg 53.524 kg      Telemetry    Sinus- Personally Reviewed  ECG    EKG from yesterday is personally Reviewed by me and shows sinus brady, no ischemic changes  Physical Exam   General: Well developed, well nourished, NAD  HEENT: OP clear, mucus membranes moist  SKIN: warm, dry. No rashes. Neuro: No focal deficits  Musculoskeletal: Muscle strength 5/5 all ext  Psychiatric: Mood and affect normal  Neck: No JVD, no carotid bruits, no thyromegaly, no lymphadenopathy.  Lungs:Clear bilaterally, no wheezes,  rhonci, crackles Cardiovascular: Regular rate and rhythm. No murmurs, gallops or rubs. Abdomen:Soft. Bowel sounds present. Non-tender.  Extremities: No lower extremity edema. Pulses are 2 + in the bilateral DP/PT.     Labs    High Sensitivity Troponin:   Recent Labs  Lab 01/26/20 1411 01/26/20 1815  TROPONINIHS 4 6      Chemistry Recent Labs  Lab 01/26/20 1411 01/30/20 0255  NA 142 142  K 5.0 4.3  CL 108 110  CO2 22 22  GLUCOSE 156* 173*  BUN 11 10  CREATININE 0.96 0.88  CALCIUM 10.5* 10.4*  PROT 7.2  --   ALBUMIN 4.3  --   AST 18  --   ALT 13  --   ALKPHOS 80  --   BILITOT 1.0  --   GFRNONAA 60* >60  GFRAA >60 >60  ANIONGAP 12 10     Hematology Recent Labs  Lab 01/28/20 0216 01/29/20 0242 01/30/20 0255  WBC 8.8 8.8 9.0  RBC 4.97 4.99 4.74  HGB 11.7* 11.3* 10.7*  HCT 38.6 38.2 36.4  MCV 77.7* 76.6* 76.8*  MCH 23.5* 22.6* 22.6*  MCHC 30.3 29.6* 29.4*  RDW 15.9* 15.6* 15.7*  PLT 245 232 230    BNPNo results for input(s): BNP,  PROBNP in the last 168 hours.   DDimer No results for input(s): DDIMER in the last 168 hours.   Radiology    VAS US DOPPLER PRE CABG  Result Date: 01/30/2020 PREOPERATIVE VASCULAR EVALUATION  Indications:            Pre-CABG. Risk Factors:           Hypertension, hyperlipidemia, Diabetes, coronary artery                         disease, PAD. Other Factors: Vascular Interventions: Right Carotid endarterectomy 2014. Comparison Study:       Prior study from 01/11/19 is available for comparison Performing Technologist: Sharion Dove RVS Supporting Technologist: Abram Sander RVS  Examination Guidelines: A complete evaluation includes B-mode imaging, spectral Doppler, color Doppler, and power Doppler as needed of all accessible portions of each vessel. Bilateral testing is considered an integral part of a complete examination. Limited examinations for reoccurring indications may be performed as noted.  Right Carotid Findings:  +----------+--------+--------+--------+-----------+------------------+           PSV cm/sEDV cm/sStenosisDescribe   Comments           +----------+--------+--------+--------+-----------+------------------+ CCA Prox  67      14                         intimal thickening +----------+--------+--------+--------+-----------+------------------+ CCA Distal52      12                         intimal thickening +----------+--------+--------+--------+-----------+------------------+ ICA Prox  55      15              homogeneous                   +----------+--------+--------+--------+-----------+------------------+ ICA Distal44      18                         tortuous           +----------+--------+--------+--------+-----------+------------------+ ECA       93                                                    +----------+--------+--------+--------+-----------+------------------+ Portions of this table do not appear on this page. +----------+--------+-------+--------+------------+           PSV cm/sEDV cmsDescribeArm Pressure +----------+--------+-------+--------+------------+ Subclavian90                                  +----------+--------+-------+--------+------------+ +---------+--------+--+--------+--+ VertebralPSV cm/s41EDV cm/s11 +---------+--------+--+--------+--+ Left Carotid Findings: +----------+--------+--------+--------+------------+------------------+           PSV cm/sEDV cm/sStenosisDescribe    Comments           +----------+--------+--------+--------+------------+------------------+ CCA Prox  83      10                          intimal thickening +----------+--------+--------+--------+------------+------------------+ CCA Distal63      13                          intimal thickening +----------+--------+--------+--------+------------+------------------+ ICA Prox  67      21              heterogenous                    +----------+--------+--------+--------+------------+------------------+ ICA Distal89      27                                             +----------+--------+--------+--------+------------+------------------+ ECA       74                                                     +----------+--------+--------+--------+------------+------------------+ +----------+--------+--------+--------+------------+ SubclavianPSV cm/sEDV cm/sDescribeArm Pressure +----------+--------+--------+--------+------------+           90                                   +----------+--------+--------+--------+------------+ +---------+--------+--+--------+--+ VertebralPSV cm/s67EDV cm/s14 +---------+--------+--+--------+--+  ABI Findings: +--------+------------------+-----+---------+--------+ Right   Rt Pressure (mmHg)IndexWaveform Comment  +--------+------------------+-----+---------+--------+ [id number]                    triphasic         +--------+------------------+-----+---------+--------+ PTA     159               0.87 biphasic          +--------+------------------+-----+---------+--------+ DP      165               0.91 biphasic          +--------+------------------+-----+---------+--------+ +--------+------------------+-----+---------+-------+ Left    Lt Pressure (mmHg)IndexWaveform Comment +--------+------------------+-----+---------+-------+ [id number]                    triphasic        +--------+------------------+-----+---------+-------+ PTA     179               0.98 biphasic         +--------+------------------+-----+---------+-------+ DP      192               1.05 biphasic         +--------+------------------+-----+---------+-------+ +-------+---------------+----------------+ ABI/TBIToday's ABI/TBIPrevious ABI/TBI +-------+---------------+----------------+ Right  0.91                            +-------+---------------+----------------+ Left    1.05                            +-------+---------------+----------------+  Right Doppler Findings: +-----------+--------+-----+---------+-----------------------------------------+ Site       PressureIndexDoppler  Comments                                  +-----------+--------+-----+---------+-----------------------------------------+ Brachial   182          triphasic                                          +-----------+--------+-----+---------+-----------------------------------------+ Radial  triphasic                                          +-----------+--------+-----+---------+-----------------------------------------+ Ulnar                   triphasic                                          +-----------+--------+-----+---------+-----------------------------------------+ Palmar Arch                      Doppler signal reverses with radial                                        compression                               +-----------+--------+-----+---------+-----------------------------------------+  Left Doppler Findings: +-----------+--------+-----+---------+-----------------------------------------+ Site       PressureIndexDoppler  Comments                                  +-----------+--------+-----+---------+-----------------------------------------+ Brachial   168          triphasic                                          +-----------+--------+-----+---------+-----------------------------------------+ Radial                  triphasic                                          +-----------+--------+-----+---------+-----------------------------------------+ Ulnar                   triphasic                                          +-----------+--------+-----+---------+-----------------------------------------+ Palmar Arch                      Doppler signal reverses with radial                                         compression                               +-----------+--------+-----+---------+-----------------------------------------+  Summary: Right Carotid: Velocities in the right ICA are consistent with a 1-39% stenosis.                Carotid endarterectomy is patent. Left Carotid: Velocities in the left ICA are consistent with a 1-39% stenosis. Vertebrals:  Bilateral vertebral arteries demonstrate antegrade flow. Subclavians: Normal flow hemodynamics were seen in bilateral subclavian  arteries. Right ABI: Resting right ankle-brachial index indicates mild right lower extremity arterial disease. Left ABI: Resting left ankle-brachial index is within normal range. No evidence of significant left lower extremity arterial disease. Right Upper Extremity: Doppler waveforms remain within normal limits with right radial compression. Doppler waveforms remain within normal limits with right ulnar compression. Left Upper Extremity: Doppler waveforms remain within normal limits with left radial compression. Doppler waveforms remain within normal limits with left ulnar compression.  Electronically signed by Ruta Hinds MD on 01/30/2020 at 10:09:34 AM.    Final     Cardiac Studies   Cardiac cath 01/26/2020 Conclusions: 1. Severe three-vessel coronary artery disease, as outlined below. 2. Normal left ventricular systolic function and filling pressure.  Recommendations: 1. Admit for cardiac surgery consultation for CABG (case discussed with Dr. Kipp Brood). 2. Medical therapy of chest pain, which is likely a combination of angina and non-cardiac chest pain. 3. Aggressive secondary prevention. 4. Obtain transthoracic echocardiogram.  [name], MD   Patient Profile     73 y.o. female with history of DM, HLD admitted with chest pain and found to have severe three vessel CAD on cath 01/26/20. She is awaiting CABG this week.   Assessment & Plan    1. CAD with unstable angina: Mild chest pain this  am. Awaiting CABG on Thursday. Will continue ASA, beta blocker, amlodipine, IV NTG and IV heparin.    2. HTN: BP is controlled. Continue current medical therapy.    3. Hyperlipidemia: Statin intolerant. Will need to consider a PCSK9 inh after her bypass surgery  4. DM: Continue current therapy  For questions or updates, please contact [place] Please consult [email or web address] for contact info under     Signed, Lauree Chandler, MD  01/30/2020, 10:16 AM

## 2020-01-31 LAB — CBC
HCT: 36.6 % (ref 36.0–46.0)
Hemoglobin: 11 g/dL — ABNORMAL LOW (ref 12.0–15.0)
MCH: 23.2 pg — ABNORMAL LOW (ref 26.0–34.0)
MCHC: 30.1 g/dL (ref 30.0–36.0)
MCV: 77.2 fL — ABNORMAL LOW (ref 80.0–100.0)
Platelets: 225 K/uL (ref 150–400)
RBC: 4.74 MIL/uL (ref 3.87–5.11)
RDW: 15.9 % — ABNORMAL HIGH (ref 11.5–15.5)
WBC: 7.9 K/uL (ref 4.0–10.5)
nRBC: 0 % (ref 0.0–0.2)

## 2020-01-31 LAB — GLUCOSE, CAPILLARY
Glucose-Capillary: 163 mg/dL — ABNORMAL HIGH (ref 70–99)
Glucose-Capillary: 181 mg/dL — ABNORMAL HIGH (ref 70–99)
Glucose-Capillary: 222 mg/dL — ABNORMAL HIGH (ref 70–99)
Glucose-Capillary: 231 mg/dL — ABNORMAL HIGH (ref 70–99)

## 2020-01-31 LAB — BASIC METABOLIC PANEL WITH GFR
Anion gap: 10 (ref 5–15)
BUN: 9 mg/dL (ref 8–23)
CO2: 24 mmol/L (ref 22–32)
Calcium: 10.4 mg/dL — ABNORMAL HIGH (ref 8.9–10.3)
Chloride: 105 mmol/L (ref 98–111)
Creatinine, Ser: 0.72 mg/dL (ref 0.44–1.00)
GFR calc Af Amer: >60 mL/min
GFR calc non Af Amer: >60 mL/min
Glucose, Bld: 189 mg/dL — ABNORMAL HIGH (ref 70–99)
Potassium: 4.3 mmol/L (ref 3.5–5.1)
Sodium: 139 mmol/L (ref 135–145)

## 2020-01-31 LAB — HEPARIN LEVEL (UNFRACTIONATED): Heparin Unfractionated: 0.69 [IU]/mL (ref 0.30–0.70)

## 2020-01-31 LAB — PREPARE RBC (CROSSMATCH)

## 2020-01-31 MED ORDER — SODIUM CHLORIDE 0.9 % IV SOLN
1.5000 g | INTRAVENOUS | Status: AC
  Administered 2020-02-01: 1.5 g via INTRAVENOUS
  Filled 2020-01-31: qty 1.5

## 2020-01-31 MED ORDER — CHLORHEXIDINE GLUCONATE CLOTH 2 % EX PADS
6.0000 | MEDICATED_PAD | Freq: Once | CUTANEOUS | Status: AC
  Administered 2020-01-31: 6 via TOPICAL

## 2020-01-31 MED ORDER — TRANEXAMIC ACID (OHS) BOLUS VIA INFUSION
15.0000 mg/kg | INTRAVENOUS | Status: AC
  Administered 2020-02-01: 814.5 mg via INTRAVENOUS
  Filled 2020-01-31: qty 815

## 2020-01-31 MED ORDER — INSULIN REGULAR(HUMAN) IN NACL 100-0.9 UT/100ML-% IV SOLN
INTRAVENOUS | Status: AC
  Administered 2020-02-01: 1.7 [IU]/h via INTRAVENOUS
  Filled 2020-01-31: qty 100

## 2020-01-31 MED ORDER — SODIUM CHLORIDE 0.9 % IV SOLN
INTRAVENOUS | Status: DC
  Filled 2020-01-31: qty 30

## 2020-01-31 MED ORDER — POTASSIUM CHLORIDE 2 MEQ/ML IV SOLN
80.0000 meq | INTRAVENOUS | Status: DC
  Filled 2020-01-31: qty 40

## 2020-01-31 MED ORDER — EPINEPHRINE HCL 5 MG/250ML IV SOLN IN NS
0.0000 ug/min | INTRAVENOUS | Status: DC
  Filled 2020-01-31: qty 250

## 2020-01-31 MED ORDER — VANCOMYCIN HCL 1250 MG/250ML IV SOLN
1250.0000 mg | INTRAVENOUS | Status: AC
  Administered 2020-02-01: 1250 mg via INTRAVENOUS
  Filled 2020-01-31: qty 250

## 2020-01-31 MED ORDER — NOREPINEPHRINE 4 MG/250ML-% IV SOLN
0.0000 ug/min | INTRAVENOUS | Status: DC
  Filled 2020-01-31: qty 250

## 2020-01-31 MED ORDER — PHENYLEPHRINE HCL-NACL 20-0.9 MG/250ML-% IV SOLN
30.0000 ug/min | INTRAVENOUS | Status: AC
  Administered 2020-02-01: 25 ug/min via INTRAVENOUS
  Filled 2020-01-31: qty 250

## 2020-01-31 MED ORDER — MANNITOL 20 % IV SOLN
INTRAVENOUS | Status: DC
  Filled 2020-01-31: qty 13

## 2020-01-31 MED ORDER — METOPROLOL TARTRATE 12.5 MG HALF TABLET
12.5000 mg | ORAL_TABLET | Freq: Once | ORAL | Status: AC
  Administered 2020-02-01: 12.5 mg via ORAL
  Filled 2020-01-31: qty 1

## 2020-01-31 MED ORDER — TRANEXAMIC ACID (OHS) PUMP PRIME SOLUTION
2.0000 mg/kg | INTRAVENOUS | Status: DC
  Filled 2020-01-31: qty 1.09

## 2020-01-31 MED ORDER — MILRINONE LACTATE IN DEXTROSE 20-5 MG/100ML-% IV SOLN
0.3000 ug/kg/min | INTRAVENOUS | Status: DC
  Filled 2020-01-31: qty 100

## 2020-01-31 MED ORDER — NITROGLYCERIN IN D5W 200-5 MCG/ML-% IV SOLN
2.0000 ug/min | INTRAVENOUS | Status: DC
  Filled 2020-01-31: qty 250

## 2020-01-31 MED ORDER — PLASMA-LYTE 148 IV SOLN
INTRAVENOUS | Status: DC
  Filled 2020-01-31: qty 2.5

## 2020-01-31 MED ORDER — SODIUM CHLORIDE 0.9 % IV SOLN
750.0000 mg | INTRAVENOUS | Status: AC
  Administered 2020-02-01: 750 mg via INTRAVENOUS
  Filled 2020-01-31: qty 750

## 2020-01-31 MED ORDER — CHLORHEXIDINE GLUCONATE CLOTH 2 % EX PADS
6.0000 | MEDICATED_PAD | Freq: Once | CUTANEOUS | Status: AC
  Administered 2020-02-01: 6 via TOPICAL

## 2020-01-31 MED ORDER — DEXMEDETOMIDINE HCL IN NACL 400 MCG/100ML IV SOLN
0.1000 ug/kg/h | INTRAVENOUS | Status: AC
  Administered 2020-02-01: .3 ug/kg/h via INTRAVENOUS
  Filled 2020-01-31: qty 100

## 2020-01-31 MED ORDER — TRANEXAMIC ACID 1000 MG/10ML IV SOLN
1.5000 mg/kg/h | INTRAVENOUS | Status: AC
  Administered 2020-02-01: 1.5 mg/kg/h via INTRAVENOUS
  Filled 2020-01-31: qty 25

## 2020-01-31 MED ORDER — TEMAZEPAM 15 MG PO CAPS
15.0000 mg | ORAL_CAPSULE | Freq: Once | ORAL | Status: AC | PRN
  Administered 2020-01-31: 15 mg via ORAL
  Filled 2020-01-31: qty 1

## 2020-01-31 MED ORDER — BISACODYL 5 MG PO TBEC: 5.0000 mg | DELAYED_RELEASE_TABLET | Freq: Once | ORAL | Status: DC

## 2020-01-31 MED ORDER — CHLORHEXIDINE GLUCONATE 0.12 % MT SOLN
15.0000 mL | Freq: Once | OROMUCOSAL | Status: AC
  Administered 2020-02-01: 15 mL via OROMUCOSAL
  Filled 2020-01-31: qty 15

## 2020-01-31 NOTE — Progress Notes (Signed)
ANTICOAGULATION CONSULT NOTE - Follow Up Consult  Pharmacy Consult for heparin Indication:  CAD awaiting CABG  Labs: Recent Labs    01/29/20 0242 01/29/20 0242 01/30/20 0255 01/31/20 0136  HGB 11.3*   < > 10.7* 11.0*  HCT 38.2  --  36.4 36.6  PLT 232  --  230 225  HEPARINUNFRC 0.66  --  0.53 0.69  CREATININE  --   --  0.88 0.72   < > = values in this interval not displayed.    Assessment: 74 yr old female who presented to ED with chest pain and is S/P cardiac cath with findings of severe three-vessel disease; pt admitted for cardiac surgery consultation for CABG (CABG planned for next week).   Heparin level remains therapeutic at 0.69, CBC stable.  No overt bleeding or complications noted.  Goal of Therapy:  Heparin level 0.3-0.7 units/ml   Plan:  -Continue heparin 950 units/h -Daily heparin level and CBC  [place] Crane, Vena Austria, BCPS, BCCP Clinical Pharmacist  01/31/2020 12:52 PM   [facility] pharmacy phone numbers are listed on [email or web address]

## 2020-01-31 NOTE — Plan of Care (Signed)

## 2020-01-31 NOTE — Progress Notes (Addendum)
      [address]       [place] [address]             [phone]      No new complaints.  Continues to have mild chest pain at times, but much improved with NTG.  She remains anxious and stressed about having to wait for surgery.  Plan for CABG tomorrow afternoon with Dr. Renold Don Barrett, PA-C   Agree with above Remains on heparin and nitro gtt Pain free  Scheduled for OR tomorrow for CABG.  Zaylon Bossier [name]

## 2020-01-31 NOTE — Progress Notes (Signed)
Progress Note  Patient Name: Vanity Larsson Miyoshi Date of Encounter: 01/31/2020  [facility] Cardiologist: Dr Percival Spanish  Subjective   No chest pain  Inpatient Medications    Scheduled Meds: . amLODipine  5 mg Oral Daily  . aspirin EC  81 mg Oral Daily  . insulin aspart  0-15 Units Subcutaneous TID WC  . insulin aspart  0-5 Units Subcutaneous QHS  . mupirocin ointment  1 application Nasal BID  . nebivolol  25 mg Oral Daily  . pantoprazole  40 mg Oral Daily  . sodium chloride flush  3 mL Intravenous Q12H  . sodium chloride flush  3 mL Intravenous Q12H   Continuous Infusions: . sodium chloride    . heparin 950 Units/hr (01/31/20 0352)  . nitroGLYCERIN 10 mcg/min (01/29/20 1525)   PRN Meds: sodium chloride, acetaminophen, diphenhydrAMINE, lip balm, ondansetron (ZOFRAN) IV, sodium chloride flush   Vital Signs    Vitals:   01/30/20 1637 01/30/20 1945 01/30/20 2348 01/31/20 0352  BP: 103/89 131/64 (!) 131/58 128/64  Pulse: 61 63 (!) 56   Resp:  16 (!) 23 15  Temp: 98.4 F (36.9 C) 98.1 F (36.7 C) 97.6 F (36.4 C) 97.6 F (36.4 C)  TempSrc: Oral Oral Oral Oral  SpO2: 97% 98% 93% 97%  Weight:      Height:        Intake/Output Summary (Last 24 hours) at 01/31/2020 0759 Last data filed at 01/31/2020 0352 Gross per 24 hour  Intake 390.06 ml  Output --  Net 390.06 ml   Last 3 Weights 01/26/2020 01/26/2020 12/27/2019  Weight (lbs) 119 lb 9.6 oz 119 lb 9.6 oz 118 lb  Weight (kg) 54.25 kg 54.25 kg 53.524 kg      Telemetry    sinus - Personally Reviewed  ECG    No AM EKG  Physical Exam   General: Well developed, well nourished, NAD  HEENT: OP clear, mucus membranes moist  SKIN: warm, dry. No rashes. Neuro: No focal deficits  Musculoskeletal: Muscle strength 5/5 all ext  Psychiatric: Mood and affect normal  Neck: No JVD, no carotid bruits, no thyromegaly, no lymphadenopathy.  Lungs:Clear bilaterally, no wheezes, rhonci, crackles Cardiovascular: Regular rate  and rhythm. No murmurs, gallops or rubs. Abdomen:Soft. Bowel sounds present. Non-tender.  Extremities: No lower extremity edema. Pulses are 2 + in the bilateral DP/PT.   Labs    High Sensitivity Troponin:   Recent Labs  Lab 01/26/20 1411 01/26/20 1815  TROPONINIHS 4 6      Chemistry Recent Labs  Lab 01/26/20 1411 01/30/20 0255 01/31/20 0136  NA 142 142 139  K 5.0 4.3 4.3  CL 108 110 105  CO2 22 22 24   GLUCOSE 156* 173* 189*  BUN 11 10 9   CREATININE 0.96 0.88 0.72  CALCIUM 10.5* 10.4* 10.4*  PROT 7.2  --   --   ALBUMIN 4.3  --   --   AST 18  --   --   ALT 13  --   --   ALKPHOS 80  --   --   BILITOT 1.0  --   --   GFRNONAA 60* >60 >60  GFRAA >60 >60 >60  ANIONGAP 12 10 10      Hematology Recent Labs  Lab 01/29/20 0242 01/30/20 0255 01/31/20 0136  WBC 8.8 9.0 7.9  RBC 4.99 4.74 4.74  HGB 11.3* 10.7* 11.0*  HCT 38.2 36.4 36.6  MCV 76.6* 76.8* 77.2*  MCH 22.6* 22.6* 23.2*  MCHC 29.6* 29.4* 30.1  RDW 15.6* 15.7* 15.9*  PLT 232 230 225    BNPNo results for input(s): BNP, PROBNP in the last 168 hours.   DDimer No results for input(s): DDIMER in the last 168 hours.   Radiology    VAS US DOPPLER PRE CABG  Result Date: 01/30/2020 PREOPERATIVE VASCULAR EVALUATION  Indications:            Pre-CABG. Risk Factors:           Hypertension, hyperlipidemia, Diabetes, coronary artery                         disease, PAD. Other Factors: Vascular Interventions: Right Carotid endarterectomy 2014. Comparison Study:       Prior study from 01/11/19 is available for comparison Performing Technologist: Sharion Dove RVS Supporting Technologist: Abram Sander RVS  Examination Guidelines: A complete evaluation includes B-mode imaging, spectral Doppler, color Doppler, and power Doppler as needed of all accessible portions of each vessel. Bilateral testing is considered an integral part of a complete examination. Limited examinations for reoccurring indications may be performed as  noted.  Right Carotid Findings: +----------+--------+--------+--------+-----------+------------------+           PSV cm/sEDV cm/sStenosisDescribe   Comments           +----------+--------+--------+--------+-----------+------------------+ CCA Prox  67      14                         intimal thickening +----------+--------+--------+--------+-----------+------------------+ CCA Distal52      12                         intimal thickening +----------+--------+--------+--------+-----------+------------------+ ICA Prox  55      15              homogeneous                   +----------+--------+--------+--------+-----------+------------------+ ICA Distal44      18                         tortuous           +----------+--------+--------+--------+-----------+------------------+ ECA       93                                                    +----------+--------+--------+--------+-----------+------------------+ Portions of this table do not appear on this page. +----------+--------+-------+--------+------------+           PSV cm/sEDV cmsDescribeArm Pressure +----------+--------+-------+--------+------------+ Subclavian90                                  +----------+--------+-------+--------+------------+ +---------+--------+--+--------+--+ VertebralPSV cm/s41EDV cm/s11 +---------+--------+--+--------+--+ Left Carotid Findings: +----------+--------+--------+--------+------------+------------------+           PSV cm/sEDV cm/sStenosisDescribe    Comments           +----------+--------+--------+--------+------------+------------------+ CCA Prox  83      10                          intimal thickening +----------+--------+--------+--------+------------+------------------+ CCA Distal63      13  intimal thickening +----------+--------+--------+--------+------------+------------------+ ICA Prox  67      21               heterogenous                   +----------+--------+--------+--------+------------+------------------+ ICA Distal89      27                                             +----------+--------+--------+--------+------------+------------------+ ECA       74                                                     +----------+--------+--------+--------+------------+------------------+ +----------+--------+--------+--------+------------+ SubclavianPSV cm/sEDV cm/sDescribeArm Pressure +----------+--------+--------+--------+------------+           90                                   +----------+--------+--------+--------+------------+ +---------+--------+--+--------+--+ VertebralPSV cm/s67EDV cm/s14 +---------+--------+--+--------+--+  ABI Findings: +--------+------------------+-----+---------+--------+ Right   Rt Pressure (mmHg)IndexWaveform Comment  +--------+------------------+-----+---------+--------+ [id number]                    triphasic         +--------+------------------+-----+---------+--------+ PTA     159               0.87 biphasic          +--------+------------------+-----+---------+--------+ DP      165               0.91 biphasic          +--------+------------------+-----+---------+--------+ +--------+------------------+-----+---------+-------+ Left    Lt Pressure (mmHg)IndexWaveform Comment +--------+------------------+-----+---------+-------+ [id number]                    triphasic        +--------+------------------+-----+---------+-------+ PTA     179               0.98 biphasic         +--------+------------------+-----+---------+-------+ DP      192               1.05 biphasic         +--------+------------------+-----+---------+-------+ +-------+---------------+----------------+ ABI/TBIToday's ABI/TBIPrevious ABI/TBI +-------+---------------+----------------+ Right  0.91                             +-------+---------------+----------------+ Left   1.05                            +-------+---------------+----------------+  Right Doppler Findings: +-----------+--------+-----+---------+-----------------------------------------+ Site       PressureIndexDoppler  Comments                                  +-----------+--------+-----+---------+-----------------------------------------+ Brachial   182          triphasic                                          +-----------+--------+-----+---------+-----------------------------------------+  Radial                  triphasic                                          +-----------+--------+-----+---------+-----------------------------------------+ Ulnar                   triphasic                                          +-----------+--------+-----+---------+-----------------------------------------+ Palmar Arch                      Doppler signal reverses with radial                                        compression                               +-----------+--------+-----+---------+-----------------------------------------+  Left Doppler Findings: +-----------+--------+-----+---------+-----------------------------------------+ Site       PressureIndexDoppler  Comments                                  +-----------+--------+-----+---------+-----------------------------------------+ Brachial   168          triphasic                                          +-----------+--------+-----+---------+-----------------------------------------+ Radial                  triphasic                                          +-----------+--------+-----+---------+-----------------------------------------+ Ulnar                   triphasic                                          +-----------+--------+-----+---------+-----------------------------------------+ Palmar Arch                      Doppler signal reverses with  radial                                        compression                               +-----------+--------+-----+---------+-----------------------------------------+  Summary: Right Carotid: Velocities in the right ICA are consistent with a 1-39% stenosis.                Carotid endarterectomy is patent. Left Carotid: Velocities in the left ICA are consistent with a 1-39% stenosis. Vertebrals:  Bilateral  vertebral arteries demonstrate antegrade flow. Subclavians: Normal flow hemodynamics were seen in bilateral subclavian              arteries. Right ABI: Resting right ankle-brachial index indicates mild right lower extremity arterial disease. Left ABI: Resting left ankle-brachial index is within normal range. No evidence of significant left lower extremity arterial disease. Right Upper Extremity: Doppler waveforms remain within normal limits with right radial compression. Doppler waveforms remain within normal limits with right ulnar compression. Left Upper Extremity: Doppler waveforms remain within normal limits with left radial compression. Doppler waveforms remain within normal limits with left ulnar compression.  Electronically signed by Ruta Hinds MD on 01/30/2020 at 10:09:34 AM.    Final     Cardiac Studies   Cardiac cath 01/26/2020 Conclusions: 1. Severe three-vessel coronary artery disease, as outlined below. 2. Normal left ventricular systolic function and filling pressure.  Recommendations: 1. Admit for cardiac surgery consultation for CABG (case discussed with Dr. Kipp Brood). 2. Medical therapy of chest pain, which is likely a combination of angina and non-cardiac chest pain. 3. Aggressive secondary prevention. 4. Obtain transthoracic echocardiogram.  [name], MD   Patient Profile     74 y.o. female with history of DM, HLD admitted with chest pain and found to have severe three vessel CAD on cath 01/26/20. She is awaiting CABG this week.   Assessment & Plan      1. CAD with unstable angina: No chest pain. Awaiting CABG tomorrow. Continue ASA, beta blocker, Norvasc, IV NTG and IV heparin     2. HTN: BP is well controlled  3. Hyperlipidemia: Statin intolerant. Will need to consider a PCSK9 inh after her bypass surgery  4. DM: Continue current therapy  For questions or updates, please contact [place] Please consult [email or web address] for contact info under     Signed, Lauree Chandler, MD  01/31/2020, 7:59 AM

## 2020-02-01 ENCOUNTER — Inpatient Hospital Stay (HOSPITAL_COMMUNITY): Payer: Medicare Other | Admitting: Certified Registered Nurse Anesthetist

## 2020-02-01 ENCOUNTER — Inpatient Hospital Stay (HOSPITAL_COMMUNITY): Payer: Medicare Other

## 2020-02-01 ENCOUNTER — Inpatient Hospital Stay (HOSPITAL_COMMUNITY)
Admission: EM | Disposition: A | Discharge status: Home / Self Care | Payer: Self-pay | Source: Home / Self Care | Attending: Internal Medicine

## 2020-02-01 DIAGNOSIS — Z951 Presence of aortocoronary bypass graft: Secondary | ICD-10-CM

## 2020-02-01 HISTORY — PX: TEE WITHOUT CARDIOVERSION: SHX5443

## 2020-02-01 HISTORY — PX: ENDOVEIN HARVEST OF GREATER SAPHENOUS VEIN: SHX5059

## 2020-02-01 HISTORY — PX: CORONARY ARTERY BYPASS GRAFT: SHX141

## 2020-02-01 HISTORY — DX: Presence of aortocoronary bypass graft: Z95.1

## 2020-02-01 LAB — CBC
HCT: 35.5 % — ABNORMAL LOW (ref 36.0–46.0)
HCT: 30.6 % — ABNORMAL LOW (ref 36.0–46.0)
Hemoglobin: 10.3 g/dL — ABNORMAL LOW (ref 12.0–15.0)
Hemoglobin: 9.2 g/dL — ABNORMAL LOW (ref 12.0–15.0)
MCH: 22.3 pg — ABNORMAL LOW (ref 26.0–34.0)
MCH: 23.5 pg — ABNORMAL LOW (ref 26.0–34.0)
MCHC: 29 g/dL — ABNORMAL LOW (ref 30.0–36.0)
MCHC: 30.1 g/dL (ref 30.0–36.0)
MCV: 77 fL — ABNORMAL LOW (ref 80.0–100.0)
MCV: 78.1 fL — ABNORMAL LOW (ref 80.0–100.0)
Platelets: 219 10*3/uL (ref 150–400)
Platelets: 129 10*3/uL — ABNORMAL LOW (ref 150–400)
RBC: 4.61 MIL/uL (ref 3.87–5.11)
RBC: 3.92 MIL/uL (ref 3.87–5.11)
RDW: 16 % — ABNORMAL HIGH (ref 11.5–15.5)
RDW: 16.4 % — ABNORMAL HIGH (ref 11.5–15.5)
WBC: 6.7 10*3/uL (ref 4.0–10.5)
WBC: 8.8 10*3/uL (ref 4.0–10.5)
nRBC: 0 % (ref 0.0–0.2)
nRBC: 0 % (ref 0.0–0.2)

## 2020-02-01 LAB — URINALYSIS, ROUTINE W REFLEX MICROSCOPIC
Bacteria, UA: NONE SEEN
Bilirubin Urine: NEGATIVE
Glucose, UA: 50 mg/dL — AB
Hgb urine dipstick: NEGATIVE
Ketones, ur: NEGATIVE mg/dL
Nitrite: NEGATIVE
Protein, ur: NEGATIVE mg/dL
Specific Gravity, Urine: 1.008 (ref 1.005–1.030)
pH: 5 (ref 5.0–8.0)

## 2020-02-01 LAB — POCT I-STAT 7, (LYTES, BLD GAS, ICA,H+H)
Acid-base deficit: 4 mmol/L — ABNORMAL HIGH (ref 0.0–2.0)
Acid-base deficit: 6 mmol/L — ABNORMAL HIGH (ref 0.0–2.0)
Acid-base deficit: 8 mmol/L — ABNORMAL HIGH (ref 0.0–2.0)
Bicarbonate: 19.9 mmol/L — ABNORMAL LOW (ref 20.0–28.0)
Bicarbonate: 19.1 mmol/L — ABNORMAL LOW (ref 20.0–28.0)
Bicarbonate: 17.3 mmol/L — ABNORMAL LOW (ref 20.0–28.0)
Calcium, Ion: 1.39 mmol/L (ref 1.15–1.40)
Calcium, Ion: 1.33 mmol/L (ref 1.15–1.40)
Calcium, Ion: 1.25 mmol/L (ref 1.15–1.40)
HCT: 27 % — ABNORMAL LOW (ref 36.0–46.0)
HCT: 29 % — ABNORMAL LOW (ref 36.0–46.0)
HCT: 26 % — ABNORMAL LOW (ref 36.0–46.0)
Hemoglobin: 9.2 g/dL — ABNORMAL LOW (ref 12.0–15.0)
Hemoglobin: 9.9 g/dL — ABNORMAL LOW (ref 12.0–15.0)
Hemoglobin: 8.8 g/dL — ABNORMAL LOW (ref 12.0–15.0)
O2 Saturation: 98 %
O2 Saturation: 97 %
O2 Saturation: 96 %
Patient temperature: 35.9
Patient temperature: 37.7
Patient temperature: 98.6
Potassium: 3.3 mmol/L — ABNORMAL LOW (ref 3.5–5.1)
Potassium: 3.8 mmol/L (ref 3.5–5.1)
Potassium: 3.4 mmol/L — ABNORMAL LOW (ref 3.5–5.1)
Sodium: 145 mmol/L (ref 135–145)
Sodium: 143 mmol/L (ref 135–145)
Sodium: 146 mmol/L — ABNORMAL HIGH (ref 135–145)
TCO2: 21 mmol/L — ABNORMAL LOW (ref 22–32)
TCO2: 20 mmol/L — ABNORMAL LOW (ref 22–32)
TCO2: 18 mmol/L — ABNORMAL LOW (ref 22–32)
pCO2 arterial: 30.8 mmHg — ABNORMAL LOW (ref 32.0–48.0)
pCO2 arterial: 35 mmHg (ref 32.0–48.0)
pCO2 arterial: 34.5 mmHg (ref 32.0–48.0)
pH, Arterial: 7.415 (ref 7.350–7.450)
pH, Arterial: 7.347 — ABNORMAL LOW (ref 7.350–7.450)
pH, Arterial: 7.308 — ABNORMAL LOW (ref 7.350–7.450)
pO2, Arterial: 98 mmHg (ref 83.0–108.0)
pO2, Arterial: 93 mmHg (ref 83.0–108.0)
pO2, Arterial: 90 mmHg (ref 83.0–108.0)

## 2020-02-01 LAB — GLUCOSE, CAPILLARY
Glucose-Capillary: 202 mg/dL — ABNORMAL HIGH (ref 70–99)
Glucose-Capillary: 149 mg/dL — ABNORMAL HIGH (ref 70–99)
Glucose-Capillary: 149 mg/dL — ABNORMAL HIGH (ref 70–99)
Glucose-Capillary: 164 mg/dL — ABNORMAL HIGH (ref 70–99)
Glucose-Capillary: 172 mg/dL — ABNORMAL HIGH (ref 70–99)
Glucose-Capillary: 164 mg/dL — ABNORMAL HIGH (ref 70–99)

## 2020-02-01 LAB — COMPREHENSIVE METABOLIC PANEL
ALT: 31 U/L (ref 0–44)
AST: 27 U/L (ref 15–41)
Albumin: 3.4 g/dL — ABNORMAL LOW (ref 3.5–5.0)
Alkaline Phosphatase: 73 U/L (ref 38–126)
Anion gap: 10 (ref 5–15)
BUN: 13 mg/dL (ref 8–23)
CO2: 22 mmol/L (ref 22–32)
Calcium: 10.1 mg/dL (ref 8.9–10.3)
Chloride: 106 mmol/L (ref 98–111)
Creatinine, Ser: 0.86 mg/dL (ref 0.44–1.00)
GFR calc Af Amer: >60 mL/min (ref >60)
GFR calc non Af Amer: >60 mL/min (ref >60)
Glucose, Bld: 204 mg/dL — ABNORMAL HIGH (ref 70–99)
Potassium: 4.1 mmol/L (ref 3.5–5.1)
Sodium: 138 mmol/L (ref 135–145)
Total Bilirubin: 0.5 mg/dL (ref 0.3–1.2)
Total Protein: 6 g/dL — ABNORMAL LOW (ref 6.5–8.1)

## 2020-02-01 LAB — HEPARIN LEVEL (UNFRACTIONATED): Heparin Unfractionated: 0.55 IU/mL (ref 0.30–0.70)

## 2020-02-01 LAB — BLOOD GAS, ARTERIAL
Acid-base deficit: 2.5 mmol/L — ABNORMAL HIGH (ref 0.0–2.0)
Bicarbonate: 21.5 mmol/L (ref 20.0–28.0)
FIO2: 21
O2 Saturation: 96.3 %
Patient temperature: 36.5
pCO2 arterial: 34.5 mmHg (ref 32.0–48.0)
pH, Arterial: 7.409 (ref 7.350–7.450)
pO2, Arterial: 84.7 mmHg (ref 83.0–108.0)

## 2020-02-01 LAB — PROTIME-INR
INR: 1 (ref 0.8–1.2)
INR: 1.2 (ref 0.8–1.2)
Prothrombin Time: 12.8 seconds (ref 11.4–15.2)
Prothrombin Time: 14.9 seconds (ref 11.4–15.2)

## 2020-02-01 LAB — APTT
aPTT: 65 seconds — ABNORMAL HIGH (ref 24–36)
aPTT: 32 seconds (ref 24–36)

## 2020-02-01 LAB — HEMOGLOBIN AND HEMATOCRIT, BLOOD
HCT: 21.2 % — ABNORMAL LOW (ref 36.0–46.0)
Hemoglobin: 6.2 g/dL — CL (ref 12.0–15.0)

## 2020-02-01 LAB — PREPARE RBC (CROSSMATCH)

## 2020-02-01 LAB — PLATELET COUNT: Platelets: 131 10*3/uL — ABNORMAL LOW (ref 150–400)

## 2020-02-01 SURGERY — CORONARY ARTERY BYPASS GRAFTING (CABG)
Anesthesia: General | Site: Leg Upper

## 2020-02-01 MED ORDER — PROPOFOL 10 MG/ML IV BOLUS
INTRAVENOUS | Status: AC
  Filled 2020-02-01: qty 20

## 2020-02-01 MED ORDER — ACETAMINOPHEN 500 MG PO TABS
1000.0000 mg | ORAL_TABLET | Freq: Four times a day (QID) | ORAL | Status: DC
  Administered 2020-02-01 – 2020-02-05 (×9): 1000 mg via ORAL
  Filled 2020-02-01 (×11): qty 2

## 2020-02-01 MED ORDER — MIDAZOLAM HCL (PF) 10 MG/2ML IJ SOLN
INTRAMUSCULAR | Status: AC
  Filled 2020-02-01: qty 2

## 2020-02-01 MED ORDER — ASPIRIN 81 MG PO CHEW: 324.0000 mg | CHEWABLE_TABLET | Freq: Every day | ORAL | Status: DC

## 2020-02-01 MED ORDER — BISACODYL 10 MG RE SUPP: 10.0000 mg | Freq: Every day | RECTAL | Status: DC

## 2020-02-01 MED ORDER — BISACODYL 5 MG PO TBEC
10.0000 mg | DELAYED_RELEASE_TABLET | Freq: Every day | ORAL | Status: DC
  Administered 2020-02-02 – 2020-02-03 (×2): 10 mg via ORAL
  Filled 2020-02-01 (×3): qty 2

## 2020-02-01 MED ORDER — SODIUM CHLORIDE 0.9% FLUSH
3.0000 mL | Freq: Two times a day (BID) | INTRAVENOUS | Status: DC
  Administered 2020-02-02 – 2020-02-04 (×5): 3 mL via INTRAVENOUS

## 2020-02-01 MED ORDER — 0.9 % SODIUM CHLORIDE (POUR BTL) OPTIME
TOPICAL | Status: DC | PRN
  Administered 2020-02-01: 5000 mL

## 2020-02-01 MED ORDER — FAMOTIDINE IN NACL 20-0.9 MG/50ML-% IV SOLN
20.0000 mg | Freq: Two times a day (BID) | INTRAVENOUS | Status: AC
  Administered 2020-02-02 (×2): 20 mg via INTRAVENOUS
  Filled 2020-02-01 (×2): qty 50

## 2020-02-01 MED ORDER — LACTATED RINGERS IV SOLN: INTRAVENOUS | Status: DC

## 2020-02-01 MED ORDER — PLASMA-LYTE 148 IV SOLN
INTRAVENOUS | Status: DC | PRN
  Administered 2020-02-01: 500 mL

## 2020-02-01 MED ORDER — MIDAZOLAM HCL 5 MG/5ML IJ SOLN
INTRAMUSCULAR | Status: DC | PRN
  Administered 2020-02-01: 2 mg via INTRAVENOUS
  Administered 2020-02-01: 4 mg via INTRAVENOUS

## 2020-02-01 MED ORDER — CHLORHEXIDINE GLUCONATE 0.12 % MT SOLN
15.0000 mL | OROMUCOSAL | Status: AC
  Administered 2020-02-01: 15 mL via OROMUCOSAL

## 2020-02-01 MED ORDER — FENTANYL CITRATE (PF) 250 MCG/5ML IJ SOLN
INTRAMUSCULAR | Status: AC
  Filled 2020-02-01: qty 25

## 2020-02-01 MED ORDER — ACETAMINOPHEN 160 MG/5ML PO SOLN: 650.0000 mg | Freq: Once | ORAL | Status: AC

## 2020-02-01 MED ORDER — ONDANSETRON HCL 4 MG/2ML IJ SOLN
4.0000 mg | Freq: Four times a day (QID) | INTRAMUSCULAR | Status: DC | PRN
  Administered 2020-02-01 – 2020-02-03 (×4): 4 mg via INTRAVENOUS
  Filled 2020-02-01 (×4): qty 2

## 2020-02-01 MED ORDER — ALBUMIN HUMAN 5 % IV SOLN
250.0000 mL | INTRAVENOUS | Status: AC | PRN
  Administered 2020-02-02 (×2): 12.5 g via INTRAVENOUS

## 2020-02-01 MED ORDER — CHLORHEXIDINE GLUCONATE 0.12 % MT SOLN
OROMUCOSAL | Status: AC
  Filled 2020-02-01: qty 15

## 2020-02-01 MED ORDER — SODIUM CHLORIDE 0.9% FLUSH: 3.0000 mL | INTRAVENOUS | Status: DC | PRN

## 2020-02-01 MED ORDER — SODIUM CHLORIDE 0.9 % IV SOLN
1.5000 g | Freq: Two times a day (BID) | INTRAVENOUS | Status: AC
  Administered 2020-02-02 – 2020-02-03 (×4): 1.5 g via INTRAVENOUS
  Filled 2020-02-01 (×4): qty 1.5

## 2020-02-01 MED ORDER — DOCUSATE SODIUM 100 MG PO CAPS
200.0000 mg | ORAL_CAPSULE | Freq: Every day | ORAL | Status: DC
  Administered 2020-02-02 – 2020-02-03 (×2): 200 mg via ORAL
  Filled 2020-02-01 (×3): qty 2

## 2020-02-01 MED ORDER — HEMOSTATIC AGENTS (NO CHARGE) OPTIME
TOPICAL | Status: DC | PRN
  Administered 2020-02-01 (×3): 1 via TOPICAL

## 2020-02-01 MED ORDER — MIDAZOLAM HCL 2 MG/2ML IJ SOLN: 1.0000 mg | Freq: Once | INTRAMUSCULAR | Status: AC

## 2020-02-01 MED ORDER — OXYCODONE HCL 5 MG PO TABS
5.0000 mg | ORAL_TABLET | ORAL | Status: DC | PRN
  Administered 2020-02-01 – 2020-02-04 (×7): 10 mg via ORAL
  Administered 2020-02-04: 5 mg via ORAL
  Filled 2020-02-01: qty 1
  Filled 2020-02-01 (×7): qty 2

## 2020-02-01 MED ORDER — MIDAZOLAM HCL 2 MG/2ML IJ SOLN
INTRAMUSCULAR | Status: AC
  Administered 2020-02-01: 1 mg via INTRAVENOUS
  Filled 2020-02-01: qty 2

## 2020-02-01 MED ORDER — LACTATED RINGERS IV SOLN: 500.0000 mL | Freq: Once | INTRAVENOUS | Status: DC | PRN

## 2020-02-01 MED ORDER — METOPROLOL TARTRATE 12.5 MG HALF TABLET
12.5000 mg | ORAL_TABLET | Freq: Two times a day (BID) | ORAL | Status: DC
  Administered 2020-02-03 – 2020-02-04 (×4): 12.5 mg via ORAL
  Filled 2020-02-01 (×4): qty 1

## 2020-02-01 MED ORDER — ORAL CARE MOUTH RINSE
15.0000 mL | OROMUCOSAL | Status: DC
  Administered 2020-02-01: 15 mL via OROMUCOSAL

## 2020-02-01 MED ORDER — METOPROLOL TARTRATE 5 MG/5ML IV SOLN: 2.5000 mg | INTRAVENOUS | Status: DC | PRN

## 2020-02-01 MED ORDER — METOPROLOL TARTRATE 25 MG/10 ML ORAL SUSPENSION: 12.5000 mg | Freq: Two times a day (BID) | ORAL | Status: DC

## 2020-02-01 MED ORDER — HEPARIN SODIUM (PORCINE) 1000 UNIT/ML IJ SOLN
INTRAMUSCULAR | Status: DC | PRN
  Administered 2020-02-01: 6000 [IU] via INTRAVENOUS
  Administered 2020-02-01: 19000 [IU] via INTRAVENOUS

## 2020-02-01 MED ORDER — CHLORHEXIDINE GLUCONATE 0.12 % MT SOLN
15.0000 mL | Freq: Once | OROMUCOSAL | Status: AC
  Administered 2020-02-01: 15 mL via OROMUCOSAL

## 2020-02-01 MED ORDER — POTASSIUM CHLORIDE 10 MEQ/50ML IV SOLN
10.0000 meq | INTRAVENOUS | Status: AC
  Administered 2020-02-01 (×3): 10 meq via INTRAVENOUS

## 2020-02-01 MED ORDER — PHENYLEPHRINE HCL (PRESSORS) 10 MG/ML IV SOLN
INTRAVENOUS | Status: DC | PRN
  Administered 2020-02-01: 40 ug via INTRAVENOUS

## 2020-02-01 MED ORDER — VANCOMYCIN HCL IN DEXTROSE 1-5 GM/200ML-% IV SOLN
1000.0000 mg | Freq: Once | INTRAVENOUS | Status: AC
  Administered 2020-02-02: 1000 mg via INTRAVENOUS
  Filled 2020-02-01: qty 200

## 2020-02-01 MED ORDER — NICARDIPINE HCL IN NACL 20-0.86 MG/200ML-% IV SOLN
3.0000 mg/h | INTRAVENOUS | Status: DC
  Filled 2020-02-01: qty 200

## 2020-02-01 MED ORDER — ACETAMINOPHEN 160 MG/5ML PO SOLN: 1000.0000 mg | Freq: Four times a day (QID) | ORAL | Status: DC

## 2020-02-01 MED ORDER — LACTATED RINGERS IV SOLN: INTRAVENOUS | Status: DC | PRN

## 2020-02-01 MED ORDER — ASPIRIN EC 325 MG PO TBEC
325.0000 mg | DELAYED_RELEASE_TABLET | Freq: Every day | ORAL | Status: DC
  Administered 2020-02-02 – 2020-02-05 (×4): 325 mg via ORAL
  Filled 2020-02-01 (×4): qty 1

## 2020-02-01 MED ORDER — ROCURONIUM BROMIDE 100 MG/10ML IV SOLN
INTRAVENOUS | Status: DC | PRN
  Administered 2020-02-01 (×2): 50 mg via INTRAVENOUS
  Administered 2020-02-01: 40 mg via INTRAVENOUS

## 2020-02-01 MED ORDER — FAMOTIDINE IN NACL 20-0.9 MG/50ML-% IV SOLN
INTRAVENOUS | Status: AC
  Administered 2020-02-01: 20 mg
  Filled 2020-02-01: qty 50

## 2020-02-01 MED ORDER — FENTANYL CITRATE (PF) 100 MCG/2ML IJ SOLN: 50.0000 ug | Freq: Once | INTRAMUSCULAR | Status: AC

## 2020-02-01 MED ORDER — MIDAZOLAM HCL 2 MG/2ML IJ SOLN
1.0000 mg | Freq: Once | INTRAMUSCULAR | Status: AC
  Administered 2020-02-01: 1 mg via INTRAVENOUS

## 2020-02-01 MED ORDER — FENTANYL CITRATE (PF) 100 MCG/2ML IJ SOLN
INTRAMUSCULAR | Status: DC | PRN
  Administered 2020-02-01: 250 ug via INTRAVENOUS
  Administered 2020-02-01 (×2): 200 ug via INTRAVENOUS
  Administered 2020-02-01: 150 ug via INTRAVENOUS
  Administered 2020-02-01: 250 ug via INTRAVENOUS
  Administered 2020-02-01: 200 ug via INTRAVENOUS

## 2020-02-01 MED ORDER — MORPHINE SULFATE (PF) 2 MG/ML IV SOLN
1.0000 mg | INTRAVENOUS | Status: DC | PRN
  Administered 2020-02-01 (×2): 2 mg via INTRAVENOUS
  Administered 2020-02-02: 4 mg via INTRAVENOUS
  Administered 2020-02-02 (×2): 2 mg via INTRAVENOUS
  Administered 2020-02-02 (×2): 4 mg via INTRAVENOUS
  Filled 2020-02-01 (×2): qty 2
  Filled 2020-02-01 (×2): qty 1
  Filled 2020-02-01: qty 2
  Filled 2020-02-01 (×2): qty 1

## 2020-02-01 MED ORDER — CHLORHEXIDINE GLUCONATE CLOTH 2 % EX PADS
6.0000 | MEDICATED_PAD | Freq: Every day | CUTANEOUS | Status: DC
  Administered 2020-02-01 – 2020-02-02 (×2): 6 via TOPICAL

## 2020-02-01 MED ORDER — NITROGLYCERIN IN D5W 200-5 MCG/ML-% IV SOLN: 0.0000 ug/min | INTRAVENOUS | Status: DC

## 2020-02-01 MED ORDER — FENTANYL CITRATE (PF) 100 MCG/2ML IJ SOLN
INTRAMUSCULAR | Status: AC
  Administered 2020-02-01: 50 ug via INTRAVENOUS
  Filled 2020-02-01: qty 2

## 2020-02-01 MED ORDER — PROPOFOL 10 MG/ML IV BOLUS
INTRAVENOUS | Status: DC | PRN
  Administered 2020-02-01: 80 mg via INTRAVENOUS

## 2020-02-01 MED ORDER — NOREPINEPHRINE 4 MG/250ML-% IV SOLN: 0.0000 ug/min | INTRAVENOUS | Status: DC

## 2020-02-01 MED ORDER — CHLORHEXIDINE GLUCONATE 0.12% ORAL RINSE (MEDLINE KIT)
15.0000 mL | Freq: Two times a day (BID) | OROMUCOSAL | Status: DC
  Administered 2020-02-01: 15 mL via OROMUCOSAL

## 2020-02-01 MED ORDER — PANTOPRAZOLE SODIUM 40 MG PO TBEC
40.0000 mg | DELAYED_RELEASE_TABLET | Freq: Every day | ORAL | Status: DC
  Administered 2020-02-03 – 2020-02-05 (×3): 40 mg via ORAL
  Filled 2020-02-01 (×3): qty 1

## 2020-02-01 MED ORDER — MIDAZOLAM HCL 2 MG/2ML IJ SOLN: 2.0000 mg | INTRAMUSCULAR | Status: DC | PRN

## 2020-02-01 MED ORDER — SODIUM CHLORIDE 0.45 % IV SOLN: INTRAVENOUS | Status: DC | PRN

## 2020-02-01 MED ORDER — DEXTROSE 50 % IV SOLN: 0.0000 mL | INTRAVENOUS | Status: DC | PRN

## 2020-02-01 MED ORDER — EPHEDRINE SULFATE 50 MG/ML IJ SOLN
INTRAMUSCULAR | Status: DC | PRN
  Administered 2020-02-01: 5 mg via INTRAVENOUS

## 2020-02-01 MED ORDER — MAGNESIUM SULFATE 4 GM/100ML IV SOLN
4.0000 g | Freq: Once | INTRAVENOUS | Status: AC
  Administered 2020-02-01: 4 g via INTRAVENOUS
  Filled 2020-02-01: qty 100

## 2020-02-01 MED ORDER — ORAL CARE MOUTH RINSE: 15.0000 mL | Freq: Once | OROMUCOSAL | Status: AC

## 2020-02-01 MED ORDER — SODIUM CHLORIDE 0.9 % IV SOLN: INTRAVENOUS | Status: DC

## 2020-02-01 MED ORDER — ACETAMINOPHEN 650 MG RE SUPP
650.0000 mg | Freq: Once | RECTAL | Status: AC
  Administered 2020-02-01: 650 mg via RECTAL

## 2020-02-01 MED ORDER — ALBUMIN HUMAN 5 % IV SOLN: INTRAVENOUS | Status: DC | PRN

## 2020-02-01 MED ORDER — TRAMADOL HCL 50 MG PO TABS
50.0000 mg | ORAL_TABLET | ORAL | Status: DC | PRN
  Administered 2020-02-01: 50 mg via ORAL
  Filled 2020-02-01: qty 1

## 2020-02-01 MED ORDER — FENTANYL CITRATE (PF) 100 MCG/2ML IJ SOLN
50.0000 ug | Freq: Once | INTRAMUSCULAR | Status: AC
  Administered 2020-02-01: 50 ug via INTRAVENOUS

## 2020-02-01 MED ORDER — LIDOCAINE HCL (CARDIAC) PF 100 MG/5ML IV SOSY
PREFILLED_SYRINGE | INTRAVENOUS | Status: DC | PRN
  Administered 2020-02-01: 40 mg via INTRAVENOUS

## 2020-02-01 MED ORDER — INSULIN REGULAR(HUMAN) IN NACL 100-0.9 UT/100ML-% IV SOLN: INTRAVENOUS | Status: DC

## 2020-02-01 MED ORDER — PROTAMINE SULFATE 10 MG/ML IV SOLN
INTRAVENOUS | Status: DC | PRN
  Administered 2020-02-01: 20 mg via INTRAVENOUS
  Administered 2020-02-01: 170 mg via INTRAVENOUS

## 2020-02-01 MED ORDER — NICARDIPINE HCL IN NACL 20-0.86 MG/200ML-% IV SOLN
5.0000 mg/h | INTRAVENOUS | Status: DC
  Administered 2020-02-01: 5 mg/h via INTRAVENOUS
  Filled 2020-02-01 (×2): qty 200

## 2020-02-01 MED ORDER — DEXMEDETOMIDINE HCL IN NACL 400 MCG/100ML IV SOLN: 0.0000 ug/kg/h | INTRAVENOUS | Status: DC

## 2020-02-01 MED ORDER — SODIUM CHLORIDE 0.9 % IV SOLN: 250.0000 mL | INTRAVENOUS | Status: DC

## 2020-02-01 SURGICAL SUPPLY — 85 items
ADH SKN CLS APL DERMABOND .7 (GAUZE/BANDAGES/DRESSINGS) ×3
BAG DECANTER FOR FLEXI CONT (MISCELLANEOUS) ×4 IMPLANT
BLADE CLIPPER SURG (BLADE) ×3 IMPLANT
BLADE STERNUM SYSTEM 6 (BLADE) ×4 IMPLANT
BNDG ELASTIC 4X5.8 VLCR STR LF (GAUZE/BANDAGES/DRESSINGS) ×4 IMPLANT
BNDG ELASTIC 6X5.8 VLCR STR LF (GAUZE/BANDAGES/DRESSINGS) ×4 IMPLANT
BNDG GAUZE ELAST 4 BULKY (GAUZE/BANDAGES/DRESSINGS) ×4 IMPLANT
CABLE SURGICAL S-101-97-12 (CABLE) ×4 IMPLANT
CANISTER SUCT 3000ML PPV (MISCELLANEOUS) ×4 IMPLANT
CANNULA MC2 2 STG 29/37 NON-V (CANNULA) ×3 IMPLANT
CANNULA MC2 TWO STAGE (CANNULA) ×4
CANNULA NON VENT 20FR 12 (CANNULA) ×4 IMPLANT
CATH ROBINSON RED A/P 18FR (CATHETERS) ×8 IMPLANT
CLIP RETRACTION 3.0MM CORONARY (MISCELLANEOUS) ×4 IMPLANT
CLIP VESOCCLUDE MED 24/CT (CLIP) IMPLANT
CLIP VESOCCLUDE SM WIDE 24/CT (CLIP) ×1 IMPLANT
CONN ST 1/2X1/2  BEN (MISCELLANEOUS) ×4
CONN ST 1/2X1/2 BEN (MISCELLANEOUS) ×3 IMPLANT
CONNECTOR BLAKE 2:1 CARIO BLK (MISCELLANEOUS) ×4 IMPLANT
DERMABOND ADVANCED (GAUZE/BANDAGES/DRESSINGS) ×1
DERMABOND ADVANCED .7 DNX12 (GAUZE/BANDAGES/DRESSINGS) IMPLANT
DRAIN CHANNEL 19F RND (DRAIN) ×11 IMPLANT
DRAIN CONNECTOR BLAKE 1:1 (MISCELLANEOUS) ×4 IMPLANT
DRAPE CARDIOVASCULAR INCISE (DRAPES) ×4
DRAPE INCISE IOBAN 66X45 STRL (DRAPES) IMPLANT
DRAPE SLUSH/WARMER DISC (DRAPES) ×4 IMPLANT
DRAPE SRG 135X102X78XABS (DRAPES) ×3 IMPLANT
DRSG AQUACEL AG ADV 3.5X10 (GAUZE/BANDAGES/DRESSINGS) ×1 IMPLANT
DRSG COVADERM 4X14 (GAUZE/BANDAGES/DRESSINGS) ×3 IMPLANT
ELECT BLADE 4.0 EZ CLEAN MEGAD (MISCELLANEOUS) ×4
ELECT REM PT RETURN 9FT ADLT (ELECTROSURGICAL) ×8
ELECTRODE BLDE 4.0 EZ CLN MEGD (MISCELLANEOUS) IMPLANT
ELECTRODE REM PT RTRN 9FT ADLT (ELECTROSURGICAL) ×6 IMPLANT
FELT TEFLON 1X6 (MISCELLANEOUS) ×7 IMPLANT
GAUZE SPONGE 4X4 12PLY STRL (GAUZE/BANDAGES/DRESSINGS) ×8 IMPLANT
GAUZE SPONGE 4X4 12PLY STRL LF (GAUZE/BANDAGES/DRESSINGS) ×2 IMPLANT
GLOVE BIO SURGEON STRL SZ7 (GLOVE) ×8 IMPLANT
GLOVE BIO SURGEON STRL SZ7.5 (GLOVE) ×1 IMPLANT
GLOVE BIOGEL M STRL SZ7.5 (GLOVE) ×8 IMPLANT
GLOVE BIOGEL PI IND STRL 6 (GLOVE) IMPLANT
GLOVE BIOGEL PI IND STRL 6.5 (GLOVE) IMPLANT
GLOVE BIOGEL PI INDICATOR 6 (GLOVE) ×1
GLOVE BIOGEL PI INDICATOR 6.5 (GLOVE) ×1
GOWN STRL REUS W/ TWL LRG LVL3 (GOWN DISPOSABLE) ×12 IMPLANT
GOWN STRL REUS W/ TWL XL LVL3 (GOWN DISPOSABLE) ×6 IMPLANT
GOWN STRL REUS W/TWL LRG LVL3 (GOWN DISPOSABLE) ×20
GOWN STRL REUS W/TWL XL LVL3 (GOWN DISPOSABLE) ×12
HEMOSTAT POWDER SURGIFOAM 1G (HEMOSTASIS) ×12 IMPLANT
KIT BASIN OR (CUSTOM PROCEDURE TRAY) ×4 IMPLANT
KIT SUCTION CATH 14FR (SUCTIONS) ×4 IMPLANT
KIT TURNOVER KIT B (KITS) ×4 IMPLANT
KIT VASOVIEW HEMOPRO 2 VH 4000 (KITS) ×4 IMPLANT
LEAD PACING MYOCARDI (MISCELLANEOUS) ×3 IMPLANT
MARKER GRAFT CORONARY BYPASS (MISCELLANEOUS) ×12 IMPLANT
NS IRRIG 1000ML POUR BTL (IV SOLUTION) ×20 IMPLANT
PACK ACCESSORY CANNULA KIT (KITS) ×4 IMPLANT
PACK E OPEN HEART (SUTURE) ×4 IMPLANT
PACK OPEN HEART (CUSTOM PROCEDURE TRAY) ×4 IMPLANT
PAD ARMBOARD 7.5X6 YLW CONV (MISCELLANEOUS) ×8 IMPLANT
PAD ELECT DEFIB RADIOL ZOLL (MISCELLANEOUS) ×4 IMPLANT
PENCIL BUTTON HOLSTER BLD 10FT (ELECTRODE) ×4 IMPLANT
POSITIONER HEAD DONUT 9IN (MISCELLANEOUS) ×4 IMPLANT
PUNCH AORTIC ROTATE 4.0MM (MISCELLANEOUS) ×4 IMPLANT
SET CARDIOPLEGIA MPS 5001102 (MISCELLANEOUS) ×1 IMPLANT
SUPPORT HEART JANKE-BARRON (MISCELLANEOUS) ×4 IMPLANT
SUT BONE WAX W31G (SUTURE) ×4 IMPLANT
SUT ETHIBOND X763 2 0 SH 1 (SUTURE) ×8 IMPLANT
SUT MNCRL AB 3-0 PS2 18 (SUTURE) ×8 IMPLANT
SUT MNCRL AB 4-0 PS2 18 (SUTURE) ×1 IMPLANT
SUT PDS AB 1 CTX 36 (SUTURE) ×8 IMPLANT
SUT PROLENE 4 0 SH DA (SUTURE) ×4 IMPLANT
SUT PROLENE 5 0 C 1 36 (SUTURE) ×12 IMPLANT
SUT PROLENE 7 0 BV 1 (SUTURE) ×1 IMPLANT
SUT PROLENE 7 0 BV1 MDA (SUTURE) ×4 IMPLANT
SUT STEEL 6MS V (SUTURE) ×8 IMPLANT
SUT VIC AB 2-0 CT1 27 (SUTURE) ×4
SUT VIC AB 2-0 CT1 TAPERPNT 27 (SUTURE) IMPLANT
SYSTEM SAHARA CHEST DRAIN ATS (WOUND CARE) ×4 IMPLANT
TAPE CLOTH SURG 4X10 WHT LF (GAUZE/BANDAGES/DRESSINGS) ×1 IMPLANT
TOWEL GREEN STERILE (TOWEL DISPOSABLE) ×4 IMPLANT
TOWEL GREEN STERILE FF (TOWEL DISPOSABLE) ×4 IMPLANT
TRAY FOLEY SLVR 16FR TEMP STAT (SET/KITS/TRAYS/PACK) ×4 IMPLANT
TUBING LAP HI FLOW INSUFFLATIO (TUBING) ×4 IMPLANT
UNDERPAD 30X36 HEAVY ABSORB (UNDERPADS AND DIAPERS) ×4 IMPLANT
WATER STERILE IRR 1000ML POUR (IV SOLUTION) ×8 IMPLANT

## 2020-02-01 NOTE — Progress Notes (Signed)
     [address]       [place] [address]             [phone]       No events Vitals:   02/01/20 1305 02/01/20 1307  BP: (!) 121/46 (!) 137/48  Pulse: (!) 51 (!) 52  Resp: 12 10  Temp:    SpO2: 100% 100%   Alert NAD Sinus EWOB  OR today for CABG  Gearldean Lomanto O Luara Faye

## 2020-02-01 NOTE — Progress Notes (Signed)
ANTICOAGULATION CONSULT NOTE - Follow Up Consult  Pharmacy Consult for heparin Indication:  CAD awaiting CABG  Labs: Recent Labs    01/30/20 0255 01/30/20 0255 01/31/20 0136 02/01/20 0327 02/01/20 0641  HGB 10.7*   < > 11.0*  --  10.3*  HCT 36.4  --  36.6  --  35.5*  PLT 230  --  225  --  219  APTT  --   --   --  65*  --   LABPROT  --   --   --  12.8  --   INR  --   --   --  1.0  --   HEPARINUNFRC 0.53  --  0.69 0.55  --   CREATININE 0.88  --  0.72 0.86  --    < > = values in this interval not displayed.    Assessment: 74 yr old female who presented to ED with chest pain and is S/P cardiac cath with findings of severe three-vessel disease; pt admitted for cardiac surgery consultation for CABG (CABG planned for next week).   Heparin level remains therapeutic at 0.55, CBC stable.  No overt bleeding or complications noted.  Goal of Therapy:  Heparin level 0.3-0.7 units/ml   Plan:  -Continue heparin 950 units/h -Surgery planned for today  Erin Hearing PharmD., BCPS Clinical Pharmacist 02/01/2020 9:00 AM

## 2020-02-01 NOTE — Anesthesia Procedure Notes (Signed)
Central Venous Catheter Insertion Performed by: Roberts Gaudy, MD, anesthesiologist Start/End7/15/2021 11:55 AM, 02/01/2020 12:05 PM Patient location: Pre-op. Preanesthetic checklist: patient identified, IV checked, site marked, risks and benefits discussed, surgical consent, monitors and equipment checked, pre-op evaluation, timeout performed and anesthesia consent Lidocaine 1% used for infiltration and patient sedated Hand hygiene performed  and maximum sterile barriers used  Catheter size: 8.5 Fr Central line was placed.Sheath introducer Procedure performed using ultrasound guided technique. Ultrasound Notes:anatomy identified, needle tip was noted to be adjacent to the nerve/plexus identified, no ultrasound evidence of intravascular and/or intraneural injection and image(s) printed for medical record Attempts: 1 Following insertion, line sutured and dressing applied. Post procedure assessment: blood return through all ports, free fluid flow and no air  Patient tolerated the procedure well with no immediate complications.

## 2020-02-01 NOTE — Plan of Care (Signed)

## 2020-02-01 NOTE — Op Note (Signed)
[address]       [place] [address]             [phone]                                          02/01/2020 Patient:  Faith Gaines Pre-Op Dx: Unstable angina   Three-vessel coronary artery disease   Diabetes mellitus Post-op Dx: Same Procedure: CABG X 3, LIMA LAD, reverse saphenous vein graft to first diagonal, and OM 3 Endoscopic greater saphenous vein harvest on the left Intra-operative Transesophageal Echocardiogram  Surgeon and Role:      * Lajuana Matte, MD - Primary    *M. Roddenberry, PA-C- assisting  Anesthesia  general EBL: 500 ml Blood Administration: 1 unit of packed red blood cells Xclamp Time: 52 min Pump Time: 93 min  Drains: 19 F blake drain: L, mediastinal  Wires: None Counts: correct   Indications: This is a 74 year old female that was admitted from clinic with the complaint of worsening anginal symptoms.  She underwent a left heart cath which showed three-vessel coronary disease.  She is a poorly controlled diabetic.  CTS was consulted to assist with management  Findings: Small LIMA.  Small vein graft.  Calcified LAD.  The first diagonal was heavily calcified with a sore spot was identified.  The obtuse marginal was small in caliber.  All vein grafts had good flows.  The PDA was very small and not graftable.  Good post cardiopulmonary bypass function on TEE.  Operative Technique: All invasive lines were placed in pre-op holding.  After the risks, benefits and alternatives were thoroughly discussed, the patient was brought to the operative theatre.  Anesthesia was induced, and the patient was prepped and draped in normal sterile fashion.  An appropriate surgical pause was performed, and pre-operative antibiotics were dosed accordingly.  We began with simultaneous incisions were made along the left leg for harvesting of the greater saphenous vein and the chest for the sternotomy.  In regards to the sternotomy, this  was carried down with bovie cautery, and the sternum was divided with a reciprocating saw.  Meticulous hemostasis was obtained.  The left internal thoracic artery was exposed and harvested in in pedicled fashion.  The patient was systemically heparinized, and the artery was divided distally, and placed in a papaverine sponge.    The sternal elevator was removed, and a retractor was placed.  The pericardium was divided in the midline and fashioned into a cradle with pericardial stitches.   After we confirmed an appropriate ACT, the ascending aorta was cannulated in standard fashion.  The right atrial appendage was used for venous cannulation site.  Cardiopulmonary bypass was initiated, and the heart retractor was placed. The cross clamp was applied, and a dose of anterograde cardioplegia was given with good arrest of the heart.  We moved to the posterior wall of the heart, and we were unable to find a good target on the posterior wall..  .  Next we exposed the lateral wall, and found a good target on the obtuse marginal.  An end to side anastomosis with the vein graft was then created.  Next, we exposed the anterior wall of the heart and identified a good target on first diagonal.   An arteriotomy was created.  The vein was anastomosed in an end to side  fashion.  Finally, we exposed a good target on the LAD, and fashioned an end to side anastomosis between it and the LITA.  We began to re-warm, and a re-animation dose of cardioplegia was given.  The heart was de-aired, and the cross clamp was removed.  Meticulous hemostasis was obtained.    A partial occludding clamp was then placed on the ascending aorta, and we created an end to side anastomosis between it and the proximal vein grafts.  The proximal sites were marked with rings.  Hemostasis was obtained, and we separated from cardiopulmonary bypass without event.the heparin was reversed with protamine.  Chest tubes and wires were placed, and the sternum was  re-approximated with with sternal wires.  The soft tissue and skin were re-approximated wth absorbable suture.    The patient tolerated the procedure without any immediate complications, and was transferred to the ICU in guarded condition.  Faith Gaines

## 2020-02-01 NOTE — Progress Notes (Signed)
Progress Note  Patient Name: Faith Gaines Date of Encounter: 02/01/2020  [facility] Cardiologist: Dr Percival Spanish  Subjective   No chest pain  Inpatient Medications    Scheduled Meds: . amLODipine  5 mg Oral Daily  . aspirin EC  81 mg Oral Daily  . bisacodyl  5 mg Oral Once  . Chlorhexidine Gluconate Cloth  6 each Topical Once  . epinephrine  0-10 mcg/min Intravenous To OR  . heparin-papaverine-plasmalyte irrigation   Irrigation To OR  . insulin aspart  0-15 Units Subcutaneous TID WC  . insulin aspart  0-5 Units Subcutaneous QHS  . insulin   Intravenous To OR  . [place] Blood Cardioplegia vial (lidocaine/magnesium/mannitol 0.26g-4g-6.4g)   Intracoronary To OR  . nebivolol  25 mg Oral Daily  . pantoprazole  40 mg Oral Daily  . phenylephrine  30-200 mcg/min Intravenous To OR  . potassium chloride  80 mEq Other To OR  . sodium chloride flush  3 mL Intravenous Q12H  . sodium chloride flush  3 mL Intravenous Q12H  . tranexamic acid  15 mg/kg Intravenous To OR  . tranexamic acid  2 mg/kg Intracatheter To OR   Continuous Infusions: . sodium chloride    . cefUROXime (ZINACEF)  IV    . cefUROXime (ZINACEF)  IV    . dexmedetomidine    . heparin 30,000 units/NS 1000 mL solution for CELLSAVER    . heparin 950 Units/hr (01/31/20 1946)  . milrinone    . nitroGLYCERIN 10 mcg/min (01/29/20 1525)  . nitroGLYCERIN    . norepinephrine    . tranexamic acid (CYKLOKAPRON) infusion ([facility])    . vancomycin     PRN Meds: sodium chloride, acetaminophen, diphenhydrAMINE, lip balm, ondansetron (ZOFRAN) IV, sodium chloride flush   Vital Signs    Vitals:   02/01/20 0008 02/01/20 0352 02/01/20 0517 02/01/20 0621  BP: (!) 120/55 121/67 (!) 129/59 (!) 122/59  Pulse: 68 (!) 59 72 (!) 56  Resp: (!) 23 (!) 21  15  Temp: 97.8 F (36.6 C) 97.6 F (36.4 C)  98.1 F (36.7 C)  TempSrc: Oral   Oral  SpO2: 97% 99%  94%  Weight:      Height:        Intake/Output Summary (Last 24  hours) at 02/01/2020 0731 Last data filed at 02/01/2020 0350 Gross per 24 hour  Intake 539.51 ml  Output --  Net 539.51 ml   Last 3 Weights 01/26/2020 01/26/2020 12/27/2019  Weight (lbs) 119 lb 9.6 oz 119 lb 9.6 oz 118 lb  Weight (kg) 54.25 kg 54.25 kg 53.524 kg      Telemetry    NSR - Personally Reviewed  ECG    No AM EKG  Physical Exam    General: Well developed, well nourished, NAD  HEENT: OP clear, mucus membranes moist  SKIN: warm, dry. No rashes. Neuro: No focal deficits  Musculoskeletal: Muscle strength 5/5 all ext  Psychiatric: Mood and affect normal  Neck: No JVD Lungs:Clear bilaterally, no wheezes, rhonci, crackles Cardiovascular: Regular rate and rhythm. No murmurs, gallops or rubs. Abdomen:Soft. Bowel sounds present. Non-tender.  Extremities: No lower extremity edema.      Labs    High Sensitivity Troponin:   Recent Labs  Lab 01/26/20 1411 01/26/20 1815  TROPONINIHS 4 6      Chemistry Recent Labs  Lab 01/26/20 1411 01/26/20 1411 01/30/20 0255 01/31/20 0136 02/01/20 0327  NA 142   < > 142 139 138  K 5.0   < >  4.3 4.3 4.1  CL 108   < > 110 105 106  CO2 22   < > 22 24 22   GLUCOSE 156*   < > 173* 189* 204*  BUN 11   < > 10 9 13   CREATININE 0.96   < > 0.88 0.72 0.86  CALCIUM 10.5*   < > 10.4* 10.4* 10.1  PROT 7.2  --   --   --  6.0*  ALBUMIN 4.3  --   --   --  3.4*  AST 18  --   --   --  27  ALT 13  --   --   --  31  ALKPHOS 80  --   --   --  73  BILITOT 1.0  --   --   --  0.5  GFRNONAA 60*   < > >60 >60 >60  GFRAA >60   < > >60 >60 >60  ANIONGAP 12   < > 10 10 10    < > = values in this interval not displayed.     Hematology Recent Labs  Lab 01/30/20 0255 01/31/20 0136 02/01/20 0641  WBC 9.0 7.9 6.7  RBC 4.74 4.74 4.61  HGB 10.7* 11.0* 10.3*  HCT 36.4 36.6 35.5*  MCV 76.8* 77.2* 77.0*  MCH 22.6* 23.2* 22.3*  MCHC 29.4* 30.1 29.0*  RDW 15.7* 15.9* 16.0*  PLT 230 225 219    BNPNo results for input(s): BNP, PROBNP in the last  168 hours.   DDimer No results for input(s): DDIMER in the last 168 hours.   Radiology    No results found.  Cardiac Studies   Cardiac cath 01/26/2020 Conclusions: 1. Severe three-vessel coronary artery disease, as outlined below. 2. Normal left ventricular systolic function and filling pressure.  Recommendations: 1. Admit for cardiac surgery consultation for CABG (case discussed with Dr. Kipp Brood). 2. Medical therapy of chest pain, which is likely a combination of angina and non-cardiac chest pain. 3. Aggressive secondary prevention. 4. Obtain transthoracic echocardiogram.  [name], MD   Patient Profile     74 y.o. female with history of DM, HLD admitted with chest pain and found to have severe three vessel CAD on cath 01/26/20. She is awaiting CABG this week.   Assessment & Plan    1. CAD with unstable angina: No chest pain this am. Awaiting CABG later today. Will continue ASA, Norvasc, beta blocker. Continue IV NTG and IV heparin.     2. HTN: BP is controlled. Continue current therapy  3. Hyperlipidemia: Statin intolerant. Will need to consider a PCSK9 inh after her bypass surgery  4. DM: Continue current therapy  For questions or updates, please contact [place] Please consult [email or web address] for contact info under     Signed, Lauree Chandler, MD  02/01/2020, 7:31 AM

## 2020-02-01 NOTE — Brief Op Note (Signed)
01/26/2020 - 02/01/2020  4:03 PM  PATIENT:  Faith Gaines  75 y.o. female  PRE-OPERATIVE DIAGNOSIS:  CAD  POST-OPERATIVE DIAGNOSIS:  CAD  PROCEDURE:  CORONARY ARTERY BYPASS GRAFTING   LIMA->LAD SVG->D1 SVG->OM  TRANSESOPHAGEAL ECHOCARDIOGRAM (TEE) (N/A) ENDOVEIN HARVEST OF GREATER SAPHENOUS VEIN (Left)  SURGEON:  Lajuana Matte, MD - Primary  PHYSICIAN ASSISTANT: Annesha Delgreco  ASSISTANTS: Staff RNFA  ANESTHESIA:   general  EBL:  Per anesthesia and perfusion records   BLOOD ADMINISTERED:none  DRAINS: Left pleural and mediastinal drains.   LOCAL MEDICATIONS USED:  NONE  SPECIMEN:  No Specimen  DISPOSITION OF SPECIMEN:  N/A  COUNTS:  YES  DICTATION: .Dragon Dictation  PLAN OF CARE: Admit to inpatient   PATIENT DISPOSITION:  ICU - intubated and hemodynamically stable.   Delay start of Pharmacological VTE agent (>24hrs) due to surgical blood loss or risk of bleeding: yes

## 2020-02-01 NOTE — Anesthesia Preprocedure Evaluation (Addendum)
Anesthesia Evaluation  Patient identified by MRN, date of birth, ID band Patient awake    Reviewed: Allergy & Precautions, NPO status , Patient's Chart, lab work & pertinent test results  Airway Mallampati: II  TM Distance: >3 FB Neck ROM: Full    Dental  (+) Teeth Intact, Dental Advisory Given   Pulmonary former smoker,    breath sounds clear to auscultation       Cardiovascular hypertension,  Rhythm:Regular Rate:Normal     Neuro/Psych    GI/Hepatic   Endo/Other  diabetes  Renal/GU      Musculoskeletal   Abdominal   Peds  Hematology   Anesthesia Other Findings   Reproductive/Obstetrics                             Anesthesia Physical Anesthesia Plan  ASA: IV  Anesthesia Plan: General   Post-op Pain Management:    Induction: Intravenous  PONV Risk Score and Plan: Ondansetron and Dexamethasone  Airway Management Planned: Oral ETT  Additional Equipment:   Intra-op Plan:   Post-operative Plan: Post-operative intubation/ventilation  Informed Consent: I have reviewed the patients History and Physical, chart, labs and discussed the procedure including the risks, benefits and alternatives for the proposed anesthesia with the patient or authorized representative who has indicated his/her understanding and acceptance.     Dental advisory given  Plan Discussed with: CRNA and Anesthesiologist  Anesthesia Plan Comments:         Anesthesia Quick Evaluation

## 2020-02-01 NOTE — Transfer of Care (Signed)
Immediate Anesthesia Transfer of Care Note  Patient: Faith Gaines  Procedure(s) Performed: CORONARY ARTERY BYPASS GRAFTING (CABG) using LIMA to LAD; Endovein Harvested Greater Saphenous Vein: SVG to Diag1; SVG to OM1. (N/A Chest) TRANSESOPHAGEAL ECHOCARDIOGRAM (TEE) (N/A ) ENDOVEIN HARVEST OF GREATER SAPHENOUS VEIN (Left Leg Upper)  Patient Location: ICU  Anesthesia Type:General  Level of Consciousness: sedated and Patient remains intubated per anesthesia plan  Airway & Oxygen Therapy: Patient remains intubated per anesthesia plan and Patient placed on Ventilator (see vital sign flow sheet for setting)  Post-op Assessment: Report given to RN and Post -op Vital signs reviewed and stable  Post vital signs: Reviewed and stable  Last Vitals:  Vitals Value Taken Time  BP 127/66 02/01/20 1845  Temp 35.8 C 02/01/20 1854  Pulse 61 02/01/20 1854  Resp 12 02/01/20 1854  SpO2 100 % 02/01/20 1854  Vitals shown include unvalidated device data.  Last Pain:  Vitals:   02/01/20 0800  TempSrc:   PainSc: 0-No pain         Complications: No complications documented.

## 2020-02-01 NOTE — Anesthesia Procedure Notes (Signed)
Arterial Line Insertion Start/End7/15/2021 12:20 PM, 02/01/2020 12:25 PM Performed by: Colin Benton, CRNA, CRNA  Patient location: Pre-op. Preanesthetic checklist: patient identified, IV checked, site marked, risks and benefits discussed, surgical consent, monitors and equipment checked, pre-op evaluation, timeout performed and anesthesia consent Lidocaine 1% used for infiltration and patient sedated Right, radial was placed Catheter size: 20 G Hand hygiene performed , maximum sterile barriers used  and Seldinger technique used Allen's test indicative of satisfactory collateral circulation Attempts: 1 Procedure performed without using ultrasound guided technique. Following insertion, dressing applied and Biopatch. Post procedure assessment: normal and unchanged  Patient tolerated the procedure well with no immediate complications.

## 2020-02-01 NOTE — Procedures (Signed)
Extubation Procedure Note  Patient Details:   Name: Faith Gaines DOB: 1946-06-22 MRN: [id number]   Airway Documentation:    Vent end date: 02/01/20 Vent end time: 2135   Evaluation  O2 sats: stable throughout Complications: No apparent complications Patient did tolerate procedure well. Bilateral Breath Sounds: Clear, Diminished   Yes   nif-20 vc 777ml no stridor noted.  4L pt able to speak tolerated well.   Angelique Holm 02/01/2020, 9:39 PM

## 2020-02-01 NOTE — Progress Notes (Addendum)
CTS  Blood pressure 118/64, pulse 61, temperature (!) 96.4 F (35.8 C), resp. rate 12, height 5\' 1"  (1.549 m), weight 54.2 kg, SpO2 100 %.    Patient examined and record reviewed.Hemodynamics stable,labs satisfactory.BP elevated postop, NTG max - start cardene  Tharon Aquas Trigt III 02/01/2020

## 2020-02-01 NOTE — Anesthesia Procedure Notes (Signed)
Procedure Name: Intubation Date/Time: 02/01/2020 1:45 PM Performed by: Oletta Lamas, CRNA Pre-anesthesia Checklist: Patient identified, Emergency Drugs available, Suction available and Patient being monitored Patient Re-evaluated:Patient Re-evaluated prior to induction Oxygen Delivery Method: Circle System Utilized Preoxygenation: Pre-oxygenation with 100% oxygen Induction Type: IV induction Ventilation: Mask ventilation without difficulty Laryngoscope Size: Miller and 2 Grade View: Grade I Tube type: Oral Tube size: 8.0 mm Number of attempts: 1 Airway Equipment and Method: Stylet and Oral airway Placement Confirmation: ETT inserted through vocal cords under direct vision,  positive ETCO2 and breath sounds checked- equal and bilateral Secured at: 22 cm Tube secured with: Tape Dental Injury: Teeth and Oropharynx as per pre-operative assessment

## 2020-02-01 NOTE — Progress Notes (Signed)
  Echocardiogram Echocardiogram Transesophageal has been performed.  Faith Gaines 02/01/2020, 2:11 PM

## 2020-02-02 ENCOUNTER — Encounter (HOSPITAL_COMMUNITY): Payer: Self-pay | Admitting: Thoracic Surgery (Cardiothoracic Vascular Surgery)

## 2020-02-02 ENCOUNTER — Inpatient Hospital Stay (HOSPITAL_COMMUNITY): Payer: Medicare Other

## 2020-02-02 LAB — BASIC METABOLIC PANEL
Anion gap: 8 (ref 5–15)
Anion gap: 9 (ref 5–15)
BUN: 9 mg/dL (ref 8–23)
BUN: 12 mg/dL (ref 8–23)
CO2: 21 mmol/L — ABNORMAL LOW (ref 22–32)
CO2: 21 mmol/L — ABNORMAL LOW (ref 22–32)
Calcium: 9.1 mg/dL (ref 8.9–10.3)
Calcium: 9.6 mg/dL (ref 8.9–10.3)
Chloride: 110 mmol/L (ref 98–111)
Chloride: 107 mmol/L (ref 98–111)
Creatinine, Ser: 0.84 mg/dL (ref 0.44–1.00)
Creatinine, Ser: 0.87 mg/dL (ref 0.44–1.00)
GFR calc Af Amer: >60 mL/min (ref >60)
GFR calc Af Amer: >60 mL/min (ref >60)
GFR calc non Af Amer: >60 mL/min (ref >60)
GFR calc non Af Amer: >60 mL/min (ref >60)
Glucose, Bld: 262 mg/dL — ABNORMAL HIGH (ref 70–99)
Glucose, Bld: 170 mg/dL — ABNORMAL HIGH (ref 70–99)
Potassium: 4.5 mmol/L (ref 3.5–5.1)
Potassium: 4.1 mmol/L (ref 3.5–5.1)
Sodium: 139 mmol/L (ref 135–145)
Sodium: 137 mmol/L (ref 135–145)

## 2020-02-02 LAB — CBC
HCT: 35.2 % — ABNORMAL LOW (ref 36.0–46.0)
HCT: 29.8 % — ABNORMAL LOW (ref 36.0–46.0)
Hemoglobin: 10.5 g/dL — ABNORMAL LOW (ref 12.0–15.0)
Hemoglobin: 9 g/dL — ABNORMAL LOW (ref 12.0–15.0)
MCH: 23.6 pg — ABNORMAL LOW (ref 26.0–34.0)
MCH: 24.5 pg — ABNORMAL LOW (ref 26.0–34.0)
MCHC: 29.8 g/dL — ABNORMAL LOW (ref 30.0–36.0)
MCHC: 30.2 g/dL (ref 30.0–36.0)
MCV: 79.1 fL — ABNORMAL LOW (ref 80.0–100.0)
MCV: 81 fL (ref 80.0–100.0)
Platelets: 216 10*3/uL (ref 150–400)
Platelets: 141 10*3/uL — ABNORMAL LOW (ref 150–400)
RBC: 4.45 MIL/uL (ref 3.87–5.11)
RBC: 3.68 MIL/uL — ABNORMAL LOW (ref 3.87–5.11)
RDW: 16.3 % — ABNORMAL HIGH (ref 11.5–15.5)
RDW: 16.8 % — ABNORMAL HIGH (ref 11.5–15.5)
WBC: 19.8 10*3/uL — ABNORMAL HIGH (ref 4.0–10.5)
WBC: 11.5 10*3/uL — ABNORMAL HIGH (ref 4.0–10.5)
nRBC: 0 % (ref 0.0–0.2)
nRBC: 0 % (ref 0.0–0.2)

## 2020-02-02 LAB — GLUCOSE, CAPILLARY
Glucose-Capillary: 151 mg/dL — ABNORMAL HIGH (ref 70–99)
Glucose-Capillary: 95 mg/dL (ref 70–99)
Glucose-Capillary: 213 mg/dL — ABNORMAL HIGH (ref 70–99)
Glucose-Capillary: 156 mg/dL — ABNORMAL HIGH (ref 70–99)
Glucose-Capillary: 163 mg/dL — ABNORMAL HIGH (ref 70–99)
Glucose-Capillary: 163 mg/dL — ABNORMAL HIGH (ref 70–99)
Glucose-Capillary: 198 mg/dL — ABNORMAL HIGH (ref 70–99)
Glucose-Capillary: 184 mg/dL — ABNORMAL HIGH (ref 70–99)
Glucose-Capillary: 140 mg/dL — ABNORMAL HIGH (ref 70–99)
Glucose-Capillary: 128 mg/dL — ABNORMAL HIGH (ref 70–99)
Glucose-Capillary: 130 mg/dL — ABNORMAL HIGH (ref 70–99)

## 2020-02-02 LAB — ECHO INTRAOPERATIVE TEE
Height: 61 in
Weight: 1913.59 oz

## 2020-02-02 LAB — MAGNESIUM
Magnesium: 2.8 mg/dL — ABNORMAL HIGH (ref 1.7–2.4)
Magnesium: 2.6 mg/dL — ABNORMAL HIGH (ref 1.7–2.4)

## 2020-02-02 MED ORDER — ENOXAPARIN SODIUM 40 MG/0.4ML ~~LOC~~ SOLN
40.0000 mg | Freq: Every day | SUBCUTANEOUS | Status: DC
  Administered 2020-02-02 – 2020-02-04 (×3): 40 mg via SUBCUTANEOUS
  Filled 2020-02-02 (×3): qty 0.4

## 2020-02-02 MED ORDER — ACETAMINOPHEN 10 MG/ML IV SOLN
1000.0000 mg | Freq: Four times a day (QID) | INTRAVENOUS | Status: AC
  Administered 2020-02-02 – 2020-02-03 (×4): 1000 mg via INTRAVENOUS
  Filled 2020-02-02 (×4): qty 100

## 2020-02-02 MED ORDER — SODIUM CHLORIDE 0.9% FLUSH: 10.0000 mL | INTRAVENOUS | Status: DC | PRN

## 2020-02-02 MED ORDER — INSULIN DETEMIR 100 UNIT/ML ~~LOC~~ SOLN
10.0000 [IU] | Freq: Every day | SUBCUTANEOUS | Status: DC
  Administered 2020-02-02 – 2020-02-03 (×2): 10 [IU] via SUBCUTANEOUS
  Filled 2020-02-02 (×3): qty 0.1

## 2020-02-02 MED ORDER — INSULIN ASPART 100 UNIT/ML ~~LOC~~ SOLN
0.0000 [IU] | SUBCUTANEOUS | Status: DC
  Administered 2020-02-02 – 2020-02-03 (×8): 2 [IU] via SUBCUTANEOUS
  Administered 2020-02-04: 8 [IU] via SUBCUTANEOUS
  Administered 2020-02-04 (×2): 2 [IU] via SUBCUTANEOUS

## 2020-02-02 MED ORDER — SODIUM CHLORIDE 0.9% FLUSH
10.0000 mL | Freq: Two times a day (BID) | INTRAVENOUS | Status: DC
  Administered 2020-02-02 – 2020-02-03 (×2): 10 mL

## 2020-02-02 MED ORDER — INSULIN DETEMIR 100 UNIT/ML ~~LOC~~ SOLN
10.0000 [IU] | Freq: Every day | SUBCUTANEOUS | Status: DC
  Administered 2020-02-04: 10 [IU] via SUBCUTANEOUS
  Filled 2020-02-02 (×3): qty 0.1

## 2020-02-02 MED ORDER — LIDOCAINE 5 % EX PTCH
2.0000 | MEDICATED_PATCH | CUTANEOUS | Status: DC
  Administered 2020-02-02: 2 via TRANSDERMAL
  Filled 2020-02-02 (×3): qty 2

## 2020-02-02 MED ORDER — METHOCARBAMOL 500 MG PO TABS
500.0000 mg | ORAL_TABLET | Freq: Three times a day (TID) | ORAL | Status: DC
  Administered 2020-02-02 – 2020-02-05 (×10): 500 mg via ORAL
  Filled 2020-02-02 (×10): qty 1

## 2020-02-02 MED FILL — Heparin Sodium (Porcine) Inj 1000 Unit/ML: INTRAMUSCULAR | Qty: 30 | Status: AC

## 2020-02-02 MED FILL — Lidocaine HCl Local Preservative Free (PF) Inj 2%: INTRAMUSCULAR | Qty: 15 | Status: AC

## 2020-02-02 MED FILL — Potassium Chloride Inj 2 mEq/ML: INTRAVENOUS | Qty: 40 | Status: AC

## 2020-02-02 NOTE — Progress Notes (Signed)
Progress Note  Patient Name: Faith Gaines Date of Encounter: 02/02/2020  [facility] Cardiologist: Dr Percival Spanish  Subjective   Chest wall sore. Sitting in chair.   Inpatient Medications    Scheduled Meds: . acetaminophen  1,000 mg Oral Q6H   Or  . acetaminophen (TYLENOL) oral liquid 160 mg/5 mL  1,000 mg Per Tube Q6H  . aspirin EC  325 mg Oral Daily   Or  . aspirin  324 mg Per Tube Daily  . bisacodyl  10 mg Oral Daily   Or  . bisacodyl  10 mg Rectal Daily  . Chlorhexidine Gluconate Cloth  6 each Topical Daily  . docusate sodium  200 mg Oral Daily  . metoprolol tartrate  12.5 mg Oral BID   Or  . metoprolol tartrate  12.5 mg Per Tube BID  . [START ON 02/03/2020] pantoprazole  40 mg Oral Daily  . sodium chloride flush  10-40 mL Intracatheter Q12H  . sodium chloride flush  3 mL Intravenous Q12H   Continuous Infusions: . sodium chloride    . sodium chloride    . sodium chloride    . albumin human 12.5 g (02/02/20 0627)  . cefUROXime (ZINACEF)  IV Stopped (02/02/20 0410)  . dexmedetomidine (PRECEDEX) IV infusion Stopped (02/01/20 2026)  . famotidine (PEPCID) IV Stopped (02/02/20 0056)  . insulin 1 mL/hr at 02/02/20 0604  . lactated ringers    . lactated ringers    . lactated ringers 20 mL/hr at 02/02/20 0100  . niCARDipine Stopped (02/01/20 2032)  . nitroGLYCERIN Stopped (02/01/20 2006)  . norepinephrine (LEVOPHED) Adult infusion     PRN Meds: sodium chloride, albumin human, dextrose, lactated ringers, metoprolol tartrate, midazolam, morphine injection, ondansetron (ZOFRAN) IV, oxyCODONE, sodium chloride flush, sodium chloride flush, traMADol   Vital Signs    Vitals:   02/02/20 0400 02/02/20 0500 02/02/20 0600 02/02/20 0700  BP: (!) 113/47 (!) 128/54 (!) 104/48 (!) 96/47  Pulse: 64 64 62 60  Resp: 15 (!) 8 10 11   Temp: 99.9 F (37.7 C) 99.9 F (37.7 C) 99.9 F (37.7 C) 100 F (37.8 C)  TempSrc: Bladder     SpO2: 96% 98% 96% 97%  Weight:  57.1 kg     Height:        Intake/Output Summary (Last 24 hours) at 02/02/2020 0730 Last data filed at 02/02/2020 0700 Gross per 24 hour  Intake 3421.26 ml  Output 2765 ml  Net 656.26 ml   Last 3 Weights 02/02/2020 02/01/2020 01/26/2020  Weight (lbs) 125 lb 14.1 oz 119 lb 9.6 oz 119 lb 9.6 oz  Weight (kg) 57.1 kg 54.25 kg 54.25 kg      Telemetry    sinus - Personally Reviewed  ECG    Sinus, poor R wave progression septal leads  Physical Exam   General: Well developed, well nourished, NAD  HEENT: OP clear, mucus membranes moist  SKIN: warm, dry. No rashes. Neuro: No focal deficits  Musculoskeletal: Muscle strength 5/5 all ext  Psychiatric: Mood and affect normal  Neck: No JVD Lungs:Clear bilaterally, no wheezes, rhonci, crackles Cardiovascular: Regular rate and rhythm. No murmurs, gallops or rubs. Abdomen:Soft. Bowel sounds present. Non-tender.  Extremities: No lower extremity edema.  Labs    High Sensitivity Troponin:   Recent Labs  Lab 01/26/20 1411 01/26/20 1815  TROPONINIHS 4 6      Chemistry Recent Labs  Lab 01/26/20 1411 01/30/20 0255 01/31/20 0136 01/31/20 0136 02/01/20 0327 02/01/20 1849 02/01/20 2125 02/01/20  2322 02/02/20 0338  NA 142   < > 139   < > 138   < > 143 146* 139  K 5.0   < > 4.3   < > 4.1   < > 3.8 3.4* 4.5  CL 108   < > 105  --  106  --   --   --  110  CO2 22   < > 24  --  22  --   --   --  21*  GLUCOSE 156*   < > 189*  --  204*  --   --   --  262*  BUN 11   < > 9  --  13  --   --   --  9  CREATININE 0.96   < > 0.72  --  0.86  --   --   --  0.84  CALCIUM 10.5*   < > 10.4*  --  10.1  --   --   --  9.1  PROT 7.2  --   --   --  6.0*  --   --   --   --   ALBUMIN 4.3  --   --   --  3.4*  --   --   --   --   AST 18  --   --   --  27  --   --   --   --   ALT 13  --   --   --  31  --   --   --   --   ALKPHOS 80  --   --   --  73  --   --   --   --   BILITOT 1.0  --   --   --  0.5  --   --   --   --   GFRNONAA 60*   < > >60  --  >60  --   --   --   >60  GFRAA >60   < > >60  --  >60  --   --   --  >60  ANIONGAP 12   < > 10  --  10  --   --   --  8   < > = values in this interval not displayed.     Hematology Recent Labs  Lab 02/01/20 0641 02/01/20 0641 02/01/20 1634 02/01/20 1634 02/01/20 1846 02/01/20 1849 02/01/20 2125 02/01/20 2322 02/02/20 0338  WBC 6.7  --   --   --  8.8  --   --   --  19.8*  RBC 4.61  --   --   --  3.92  --   --   --  4.45  HGB 10.3*   < > 6.2*   < > 9.2*   < > 9.9* 8.8* 10.5*  HCT 35.5*   < > 21.2*   < > 30.6*   < > 29.0* 26.0* 35.2*  MCV 77.0*  --   --   --  78.1*  --   --   --  79.1*  MCH 22.3*  --   --   --  23.5*  --   --   --  23.6*  MCHC 29.0*  --   --   --  30.1  --   --   --  29.8*  RDW 16.0*  --   --   --  16.4*  --   --   --  16.3*  PLT 219   < > 131*  --  129*  --   --   --  216   < > = values in this interval not displayed.    BNPNo results for input(s): BNP, PROBNP in the last 168 hours.   DDimer No results for input(s): DDIMER in the last 168 hours.   Radiology    DG Chest 2 View  Result Date: 02/01/2020 CLINICAL DATA:  Preop. EXAM: CHEST - 2 VIEW COMPARISON:  January 16, 2014. FINDINGS: The heart size and mediastinal contours are within normal limits. Both lungs are clear. The visualized skeletal structures are unremarkable. IMPRESSION: No active cardiopulmonary disease. Electronically Signed   By: Marijo Conception M.D.   On: 02/01/2020 08:13   DG Chest Port 1 View  Result Date: 02/01/2020 CLINICAL DATA:  Postop for CABG EXAM: PORTABLE CHEST 1 VIEW COMPARISON:  02/01/2020 at 6:05 a.m. FINDINGS: 6:45 p.m. Endotracheal tube terminates 2.6 cm above carina. Nasogastric tube terminates at the body of the stomach. Right internal jugular line tip at low SVC. Inferiorly positioned Swan-Ganz catheter with tip at proximal left pulmonary artery. Interval median sternotomy for CABG. Left-sided chest tube. Mild right hemidiaphragm elevation. Normal heart size for level of inspiration. No pleural  effusion or pneumothorax. No congestive failure. Mild left base airspace disease. IMPRESSION: Interval CABG, without acute complication. Appropriate position of support apparatus, without pneumothorax. Left base airspace disease, favored to represent atelectasis. Electronically Signed   By: Abigail Miyamoto M.D.   On: 02/01/2020 19:06   ECHO INTRAOPERATIVE TEE  Result Date: 02/02/2020  *INTRAOPERATIVE TRANSESOPHAGEAL REPORT *  Patient Name:   GABBIE MARZO Date of Exam: 02/01/2020 Medical Rec #:  [id number]            Height:       61.0 in Accession #:    [id number]           Weight:       119.6 lb Date of Birth:  1946-01-05           BSA:          1.52 m Patient Age:    74 years             BP:           122/59 mmHg Patient Gender: F                    HR:           52 bpm. Exam Location:  Inpatient Transesophogeal exam was perform intraoperatively during surgical procedure. Patient was closely monitored under general anesthesia during the entirety of examination. Indications:     I25.110 Atherosclerotic heart disease of native coronary artery                  with unstable angina pectoris Performing Phys: [id number] Lucile Crater LIGHTFOOT Diagnosing Phys: Roberts Gaudy MD Complications: No known complications during this procedure. POST-OP IMPRESSIONS - Aorta: The aorta appears unchanged from pre-bypass. - Aortic Valve: The aortic valve appears unchanged from pre-bypass. - Mitral Valve: The mitral valve appears unchanged from pre-bypass. - Tricuspid Valve: The tricuspid valve appears unchanged from pre-bypass. There is moderate regurgitation. PRE-OP FINDINGS  Left Ventricle: The left ventricle has normal systolic function, with an ejection fraction of 60-65%. The cavity size was normal. There is normal left ventricular wall thickness. Right Ventricle: The right ventricle has normal systolic function. The cavity was mildly enlarged. There is  no increase in right ventricular wall thickness. Left Atrium: The left  atrial appendage is well visualized and there is evidence of thrombus present. Right Atrium: Right atrial size was normal in size.  Interatrial Septum: No atrial level shunt detected by color flow Doppler. Pericardium: There is no evidence of pericardial effusion. Mitral Valve: The mitral valve is normal in structure. No thickening of the mitral valve leaflet. Mitral valve regurgitation is trivial by color flow Doppler. Tricuspid Valve: The tricuspid valve was normal in structure. Tricuspid valve regurgitation is mild-moderate by color flow Doppler. The jet is directed centrally. Aortic Valve: The aortic valve is tricuspid There is mild thickening of the aortic valve Aortic valve regurgitation was not visualized by color flow Doppler. There is no stenosis of the aortic valve. There is no evidence of a vegetation on the aortic valve. Pulmonic Valve: The pulmonic valve was normal in structure, with normal. Pulmonic valve regurgitation is trivial by color flow Doppler. Aorta: The aortic root and proximal ascending aorta were of normal diameter with a well defined aortic root and sinotubular junction without dilataion or effacement. There was moderate intimal thickening but no protruding atheromatous disease present. The decending thoracic aorta had severe atherosclerotic disease present with grade 4 and 5 plaques protruding into the aortic lumen. The diameter of the descending aorta was normal.  Roberts Gaudy MD Electronically signed by Roberts Gaudy MD Signature Date/Time: 02/02/2020/6:58:39 AM    Final     Cardiac Studies   Cardiac cath 01/26/2020 Conclusions: 1. Severe three-vessel coronary artery disease, as outlined below. 2. Normal left ventricular systolic function and filling pressure.  Recommendations: 1. Admit for cardiac surgery consultation for CABG (case discussed with Dr. Kipp Brood). 2. Medical therapy of chest pain, which is likely a combination of angina and non-cardiac chest  pain. 3. Aggressive secondary prevention. 4. Obtain transthoracic echocardiogram.  [name], MD   Patient Profile     74 y.o. female with history of DM, HLD admitted with chest pain and found to have severe three vessel CAD on cath 01/26/20. She is awaiting CABG this week.   Assessment & Plan    1. CAD with unstable angina: She is now one day post CABG. Hemodynamically stable. Continue ASA and beta blocker. Appreciate the care of the surgical team.   2. HTN: BP stable.  3. Hyperlipidemia: Statin intolerant. Will need to consider a PCSK9 inh after her bypass surgery  4. DM: Continue current therapy  For questions or updates, please contact [place] Please consult [email or web address] for contact info under     Signed, Lauree Chandler, MD  02/02/2020, 7:30 AM

## 2020-02-02 NOTE — Progress Notes (Signed)
      [address]       [place] [address]             [phone]                 1 Day Post-Op Procedure(s) (LRB): CORONARY ARTERY BYPASS GRAFTING (CABG) using LIMA to LAD; Endovein Harvested Greater Saphenous Vein: SVG to Diag1; SVG to OM1. (N/A) TRANSESOPHAGEAL ECHOCARDIOGRAM (TEE) (N/A) ENDOVEIN HARVEST OF GREATER SAPHENOUS VEIN (Left)   Events: No events overnight.  Extubated up to chair this morning Did require a lot of pain control overnight. _______________________________________________________________ Vitals: BP (!) 102/51   Pulse 62   Temp 99.7 F (37.6 C)   Resp 17   Ht 5\' 1"  (1.549 m)   Wt 57.1 kg   SpO2 97%   BMI 23.79 kg/m   - Neuro: Alert but slightly drowsy, NAD  - Cardiovascular: Sinus  Drips: None.   CVP:  [1 mmHg-15 mmHg] 13 mmHg  - Pulm: Easy work of breathing  ABG    Component Value Date/Time   PHART 7.308 (L) 02/01/2020 2322   PCO2ART 34.5 02/01/2020 2322   PO2ART 90 02/01/2020 2322   HCO3 17.3 (L) 02/01/2020 2322   TCO2 18 (L) 02/01/2020 2322   ACIDBASEDEF 8.0 (H) 02/01/2020 2322   O2SAT 96.0 02/01/2020 2322    - Abd: Soft - Extremity: Warm  .Intake/Output      07/15 0701 - 07/16 0700 07/16 0701 - 07/17 0700   P.O. 60    I.V. (mL/kg) 2432.1 (42.6)    Blood 170    IV Piggyback 819.2    Total Intake(mL/kg) 3481.3 (61)    Urine (mL/kg/hr) 2535 (1.8)    Chest Tube 230    Total Output 2765    Net +716.3            _______________________________________________________________ Labs: CBC Latest Ref Rng & Units 02/02/2020 02/01/2020 02/01/2020  WBC 4.0 - 10.5 K/uL 19.8(H) - -  Hemoglobin 12.0 - 15.0 g/dL 10.5(L) 8.8(L) 9.9(L)  Hematocrit 36 - 46 % 35.2(L) 26.0(L) 29.0(L)  Platelets 150 - 400 K/uL 216 - -   CMP Latest Ref Rng & Units 02/02/2020 02/01/2020 02/01/2020  Glucose 70 - 99 mg/dL 262(H) - -  BUN 8 - 23 mg/dL 9 - -  Creatinine 0.44 - 1.00 mg/dL 0.84 - -  Sodium 135 - 145 mmol/L 139 146(H) 143    Potassium 3.5 - 5.1 mmol/L 4.5 3.4(L) 3.8  Chloride 98 - 111 mmol/L 110 - -  CO2 22 - 32 mmol/L 21(L) - -  Calcium 8.9 - 10.3 mg/dL 9.1 - -  Total Protein 6.5 - 8.1 g/dL - - -  Total Bilirubin 0.3 - 1.2 mg/dL - - -  Alkaline Phos 38 - 126 U/L - - -  AST 15 - 41 U/L - - -  ALT 0 - 44 U/L - - -    CXR: Clear  _______________________________________________________________  Assessment and Plan: POD 1 s/p CABG  Neuro: Adding lidocaine, Robaxin and IV Tylenol to improve pain control CV: On aspirin and beta-blocker.  Patient is allergic to statins Pulm: Continue pulmonary toilet.  We will remove chest tubes today following ambulation. Renal: Good urine output creatinine stable GI: Advancing diet Heme: Stable ID: Afebrile Endo: Sliding scale insulin today. Dispo: Continue ICU care but could potentially go to floor today pending pain control.  Melodie Bouillon, MD 02/02/2020 10:37 AM

## 2020-02-02 NOTE — Discharge Summary (Addendum)
Physician Discharge Summary  Patient ID: Faith Gaines MRN: [id number] DOB/AGE: May 01, 1946 74 y.o.  Admit date: 01/26/2020 Discharge date: 02/05/2020  Admission Diagnoses:  Patient Active Problem List   Diagnosis Date Noted  . Unstable angina ([place]) 01/26/2020  . Abnormal cardiac CT angiography 01/26/2020  . Educated about COVID-19 virus infection 12/26/2019  . Precordial chest pain 12/26/2019  . Primary osteoarthritis of both hands 01/30/2019  . Prolapse of uterus 10/22/2014  . Iron deficiency anemia due to chronic blood loss   . Carotid stenosis 01/23/2014  . History of melanoma excision 01/16/2014  . DM (diabetes mellitus), type 2 with peripheral vascular complications ([place]) 01/60/1093  . Dyspnea 01/16/2014  . DM (diabetes mellitus), type 2 with renal complications ([place])   . Hyperlipidemia   . Hypertension   . Carotid bruit   . Osteopenia   . Peripheral vascular disease ([place])   . GERD (gastroesophageal reflux disease)   . Diarrhea   . Headache(784.0) 08/01/2013  . Carotid artery disease ([place]) 06/29/2013  . Occlusion and stenosis of carotid artery without mention of cerebral infarction 06/27/2013  . Breast cancer of upper-outer quadrant of left female breast, Tis, 3 oclock 06/23/2013   Discharge Diagnoses:   Patient Active Problem List   Diagnosis Date Noted  . 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  . Unstable angina ([place]) 01/26/2020  . Abnormal cardiac CT angiography 01/26/2020  . Educated about COVID-19 virus infection 12/26/2019  . Precordial chest pain 12/26/2019  . Primary osteoarthritis of both hands 01/30/2019  . Prolapse of uterus 10/22/2014  . Iron deficiency anemia due to chronic blood loss   . Carotid stenosis 01/23/2014  . History of melanoma excision 01/16/2014  . DM (diabetes mellitus), type 2 with peripheral vascular complications ([place]) 23/55/7322  . Dyspnea 01/16/2014  . DM (diabetes mellitus), type 2 with renal complications ([place])   . Hyperlipidemia   .  Hypertension   . Carotid bruit   . Osteopenia   . Peripheral vascular disease ([place])   . GERD (gastroesophageal reflux disease)   . Diarrhea   . Headache(784.0) 08/01/2013  . Carotid artery disease ([place]) 06/29/2013  . Occlusion and stenosis of carotid artery without mention of cerebral infarction 06/27/2013  . Breast cancer of upper-outer quadrant of left female breast, Tis, 3 oclock 06/23/2013   Discharged Condition: good  History of Present Illness:   Faith Gaines is a 74 yo female with known history of breast cancer, anxiety, CKD, Diabetes, HTN, Hyperlipidemia, and PVD.  The patient has been experiencing angina over the past several months.  She had been active prior to this attending exercise classes, but she had to discontinue this due to shortness of breath.  She has been evaluated by Cardiologist who felt she would benefit from cardiac catheterization.  This was performed on 01/26/2020 and showed multivessel CAD.  It was felt coronary bypass grafting would be indicated.  She was admitted for further care.   Hospital Course:   She was evaluated by Dr. Kipp Brood who was in agreement the patient would benefit from coronary bypass surgery.  The risks and benefits of the procedure were explained to the patient and she was agreeable to proceed.  She developed chest pain and was placed on a nitroglycerin drip.  She responded well to this and her pain was minimal.  She was taken to the operating room on 02/01/2020.  She underwent CABG x 3 utilizing LIMA to LAD, SVG to first diagonal, and SVG to OM 3.  She also  underwent greater saphenous vein from her left leg.  She tolerated the procedure without difficulty and was taken to the SICU in stable condition.  She was hypertensive and required Nitroglycerin and Cardene for support.  She was extubated the evening of surgery.  She was weaned off Nitroglycerin and Cardene as BP improved.  Her chest tubes and arterial lines were removed without difficulty.  She  had some issues with pain control and was started on a lidocaine patch and Robaxin for muscle spasms.  She was maintaining NSR and was stable for transfer to the progressive care unit .  She has continued to make excellent progress with her physical recovery using cardiac rehab modalities.  Incisions are noted to be healing well without evidence of infection.   Her blood sugars have been under adequate control using usual measures with some elevated readings.  She will be resumed on her home diabetic regimen at time of discharge.  She does have an expected acute blood loss anemia which is stable.  Renal function is also remained within normal limits.  Oxygen has been weaned and she maintains good saturations on room air.  Chest x-ray shows a small left pleural effusion and some atelectasis.  At the time of discharge the patient is felt to be quite stable.  Significant Diagnostic Studies: angiography:   Conclusions: 1. Severe three-vessel coronary artery disease, as outlined below. 2. Normal left ventricular systolic function and filling pressure.  Treatments: surgery:   Procedure: CABG X 3, LIMA LAD, reverse saphenous vein graft to first diagonal, and OM 3 Endoscopic greater saphenous vein harvest on the left Intra-operative Transesophageal Echocardiogram  Discharge Exam: Blood pressure (!) 147/68, pulse 81, temperature 98.5 F (36.9 C), temperature source Oral, resp. rate 20, height 5\' 1"  (1.549 m), weight 56.7 kg, SpO2 97 %.   General appearance: alert, cooperative and no distress Heart: regular rate and rhythm Lungs: clear to auscultation bilaterally Abdomen: benign Extremities: no edema Wound: incis healing well Discharge Medications:  The patient has been discharged on:   1.Beta Blocker:  Yes [ x  ]                              No   [   ]                              If No, reason:  2.Ace Inhibitor/ARB: Yes [   ]                                     No  [  n  ]                                      If No, reason:intolerant  3.Statin:   Yes [   ]                  No  [ X  ]                  If No, reason: intolerant  4.[name]  [ X  ]                  No   [   ]  If No, reason:    Discharge Instructions    Discharge patient   Complete by: As directed    Discharge disposition: 01-Home or Self Care   Discharge patient date: 02/05/2020     Allergies as of 02/05/2020      Reactions   Codeine Nausea And Vomiting, Other (See Comments)   Headache and vomiting   Ace Inhibitors    Drop in GFR   Clarithromycin    Unable to sleep   Crestor [rosuvastatin]    Body aches   Lipitor [atorvastatin]    Body aches   Avandamet [rosiglitazone-metformin] Rash, Other (See Comments)   Makes her hyper      Medication List    TAKE these medications   aspirin 325 MG EC tablet Take 1 tablet (325 mg total) by mouth daily. What changed:   medication strength  how much to take   Bystolic 20 MG Tabs Generic drug: Nebivolol HCl Take 1 tablet by mouth daily.   nebivolol 5 MG tablet Commonly known as: BYSTOLIC Take 5 mg by mouth daily.   Invokana 300 MG Tabs tablet Generic drug: canagliflozin Take 300 mg by mouth daily.   Kombiglyze XR 2.11-998 MG Tb24 Generic drug: Saxagliptin-Metformin Take 1 tablet by mouth 2 (two) times daily. Reported on 08/21/2015   Omega-3 2100 1050 MG Caps Take 1 capsule by mouth daily.   pantoprazole 40 MG tablet Commonly known as: PROTONIX Take 40 mg by mouth daily.   traMADol 50 MG tablet Commonly known as: ULTRAM Take 1 tablet (50 mg total) by mouth every 6 (six) hours as needed for moderate pain.   VITAMIN D PO Take 1,000 Units by mouth daily.   vitamin E 1000 UNIT capsule Take 1,000 Units by mouth daily.       Follow-up Information    Lightfoot, Lucile Crater, MD Follow up.   Specialty: Cardiothoracic Surgery Why: The office will contact you with an appointment to see the surgeon.  Also see MyChart  for all appointments. Contact information: [place] [address] [phone]        Minus Breeding, MD Follow up.   Specialty: Cardiology Why: The cardiology office should be contacting you for an appointment to see cardiologist.  Please also see MYChart for all appointments. Contact information: [place] [address] [phone]               Signed: John Giovanni 02/05/2020, 9:36 AM

## 2020-02-02 NOTE — Progress Notes (Signed)
[facility] BRIEF SICU PROGRESS NOTE  1 Day Post-Op  S/P Procedure(s) (LRB): CORONARY ARTERY BYPASS GRAFTING (CABG) using LIMA to LAD; Endovein Harvested Greater Saphenous Vein: SVG to Diag1; SVG to OM1. (N/A) TRANSESOPHAGEAL ECHOCARDIOGRAM (TEE) (N/A) ENDOVEIN HARVEST OF GREATER SAPHENOUS VEIN (Left)   Stable day NSR w/ stable BP Breathing comfortably w/ O2 sats 94-96% on 3 L/min UOP adequate  Plan: Continue current plan  Rexene Alberts, MD 02/02/2020 5:12 PM

## 2020-02-03 ENCOUNTER — Inpatient Hospital Stay (HOSPITAL_COMMUNITY): Payer: Medicare Other

## 2020-02-03 LAB — CBC
HCT: 30.4 % — ABNORMAL LOW (ref 36.0–46.0)
Hemoglobin: 9.1 g/dL — ABNORMAL LOW (ref 12.0–15.0)
MCH: 24.2 pg — ABNORMAL LOW (ref 26.0–34.0)
MCHC: 29.9 g/dL — ABNORMAL LOW (ref 30.0–36.0)
MCV: 80.9 fL (ref 80.0–100.0)
Platelets: 127 10*3/uL — ABNORMAL LOW (ref 150–400)
RBC: 3.76 MIL/uL — ABNORMAL LOW (ref 3.87–5.11)
RDW: 17.1 % — ABNORMAL HIGH (ref 11.5–15.5)
WBC: 11 10*3/uL — ABNORMAL HIGH (ref 4.0–10.5)
nRBC: 0 % (ref 0.0–0.2)

## 2020-02-03 LAB — GLUCOSE, CAPILLARY
Glucose-Capillary: 135 mg/dL — ABNORMAL HIGH (ref 70–99)
Glucose-Capillary: 152 mg/dL — ABNORMAL HIGH (ref 70–99)
Glucose-Capillary: 143 mg/dL — ABNORMAL HIGH (ref 70–99)
Glucose-Capillary: 130 mg/dL — ABNORMAL HIGH (ref 70–99)
Glucose-Capillary: 156 mg/dL — ABNORMAL HIGH (ref 70–99)

## 2020-02-03 LAB — TYPE AND SCREEN
ABO/RH(D): O POS
Antibody Screen: NEGATIVE
Unit division: 0
Unit division: 0
Unit division: 0
Unit division: 0

## 2020-02-03 LAB — BPAM RBC
Blood Product Expiration Date: 202108082359
Blood Product Expiration Date: 202108152359
Blood Product Expiration Date: 202108162359
Blood Product Expiration Date: 202108162359
ISSUE DATE / TIME: 202107151453
ISSUE DATE / TIME: 202107151453
Unit Type and Rh: 5100
Unit Type and Rh: 5100
Unit Type and Rh: 5100
Unit Type and Rh: 5100

## 2020-02-03 LAB — BASIC METABOLIC PANEL
Anion gap: 8 (ref 5–15)
BUN: 13 mg/dL (ref 8–23)
CO2: 21 mmol/L — ABNORMAL LOW (ref 22–32)
Calcium: 9.6 mg/dL (ref 8.9–10.3)
Chloride: 106 mmol/L (ref 98–111)
Creatinine, Ser: 0.94 mg/dL (ref 0.44–1.00)
GFR calc Af Amer: >60 mL/min (ref >60)
GFR calc non Af Amer: >60 mL/min (ref >60)
Glucose, Bld: 148 mg/dL — ABNORMAL HIGH (ref 70–99)
Potassium: 4.4 mmol/L (ref 3.5–5.1)
Sodium: 135 mmol/L (ref 135–145)

## 2020-02-03 MED ORDER — POTASSIUM CHLORIDE CRYS ER 20 MEQ PO TBCR
20.0000 meq | EXTENDED_RELEASE_TABLET | Freq: Every day | ORAL | Status: DC
  Administered 2020-02-04 – 2020-02-05 (×2): 20 meq via ORAL
  Filled 2020-02-03 (×2): qty 1

## 2020-02-03 MED ORDER — FUROSEMIDE 10 MG/ML IJ SOLN
20.0000 mg | Freq: Two times a day (BID) | INTRAMUSCULAR | Status: DC
  Administered 2020-02-03: 20 mg via INTRAVENOUS
  Filled 2020-02-03: qty 2

## 2020-02-03 MED ORDER — ~~LOC~~ CARDIAC SURGERY, PATIENT & FAMILY EDUCATION: Freq: Once | Status: AC

## 2020-02-03 MED ORDER — FUROSEMIDE 40 MG PO TABS
40.0000 mg | ORAL_TABLET | Freq: Every day | ORAL | Status: DC
  Administered 2020-02-04 – 2020-02-05 (×2): 40 mg via ORAL
  Filled 2020-02-03 (×2): qty 1

## 2020-02-03 NOTE — Progress Notes (Signed)
      [address]       [place] [address]             [phone]        CARDIOTHORACIC SURGERY PROGRESS NOTE   R2 Days Post-Op Procedure(s) (LRB): CORONARY ARTERY BYPASS GRAFTING (CABG) using LIMA to LAD; Endovein Harvested Greater Saphenous Vein: SVG to Diag1; SVG to OM1. (N/A) TRANSESOPHAGEAL ECHOCARDIOGRAM (TEE) (N/A) ENDOVEIN HARVEST OF GREATER SAPHENOUS VEIN (Left)  Subjective: Feels ok.  Expected soreness in chest.  No SOB.  Poor appetite with some nausea  Objective: Vital signs: BP Readings from Last 1 Encounters:  02/03/20 (!) 129/57   Pulse Readings from Last 1 Encounters:  02/03/20 64   Resp Readings from Last 1 Encounters:  02/03/20 17   Temp Readings from Last 1 Encounters:  02/03/20 98.9 F (37.2 C)    Hemodynamics: CVP:  [15 mmHg-23 mmHg] 23 mmHg  Physical Exam:  Rhythm:   sinus  Breath sounds: clear  Heart sounds:  RRR  Incisions:  Dressing dry, intact  Abdomen:  Soft, non-distended, non-tender  Extremities:  Warm, well-perfused    Intake/Output from previous day: 07/16 0701 - 07/17 0700 In: 698.2 [P.O.:180; I.V.:18; IV Piggyback:500.2] Out: 725 [Urine:700; Chest Tube:25] Intake/Output this shift: Total I/O In: 340 [P.O.:240; IV Piggyback:100] Out: -   Lab Results:  CBC: Recent Labs    02/02/20 1709 02/03/20 0256  WBC 11.5* 11.0*  HGB 9.0* 9.1*  HCT 29.8* 30.4*  PLT 141* 127*    BMET:  Recent Labs    02/02/20 1709 02/03/20 0256  NA 137 135  K 4.1 4.4  CL 107 106  CO2 21* 21*  GLUCOSE 170* 148*  BUN 12 13  CREATININE 0.87 0.94  CALCIUM 9.6 9.6     PT/INR:   Recent Labs    02/01/20 1846  LABPROT 14.9  INR 1.2    CBG (last 3)  Recent Labs    02/02/20 2316 02/03/20 0336 02/03/20 0656  GLUCAP 130* 135* 152*    ABG    Component Value Date/Time   PHART 7.308 (L) 02/01/2020 2322   PCO2ART 34.5 02/01/2020 2322   PO2ART 90 02/01/2020 2322   HCO3 17.3 (L) 02/01/2020 2322   TCO2 18 (L)  02/01/2020 2322   ACIDBASEDEF 8.0 (H) 02/01/2020 2322   O2SAT 96.0 02/01/2020 2322    CXR: Mild atelectasis L>R  Assessment/Plan: S/P Procedure(s) (LRB): CORONARY ARTERY BYPASS GRAFTING (CABG) using LIMA to LAD; Endovein Harvested Greater Saphenous Vein: SVG to Diag1; SVG to OM1. (N/A) TRANSESOPHAGEAL ECHOCARDIOGRAM (TEE) (N/A) ENDOVEIN HARVEST OF GREATER SAPHENOUS VEIN (Left)  Overall doing well POD2 Maintaining NSR w/ stable BP Breathing comfortably w/ O2 sats 96% on 1 L/min Expected post op acute blood loss anemia, mild Expected post op volume excess, weight reportedly 3.7 kg > preop yesterday, not recorded today Expected post op atelectasis, L>R   Mobilize  Gentle diuresis  Continue CBG's and SSI q 4hr until oral intake improves  Transfer 4E  Rexene Alberts, MD 02/03/2020 10:12 AM

## 2020-02-04 ENCOUNTER — Inpatient Hospital Stay (HOSPITAL_COMMUNITY): Payer: Medicare Other

## 2020-02-04 LAB — BASIC METABOLIC PANEL
Anion gap: 8 (ref 5–15)
BUN: 12 mg/dL (ref 8–23)
CO2: 23 mmol/L (ref 22–32)
Calcium: 9.9 mg/dL (ref 8.9–10.3)
Chloride: 108 mmol/L (ref 98–111)
Creatinine, Ser: 0.74 mg/dL (ref 0.44–1.00)
GFR calc Af Amer: >60 mL/min (ref >60)
GFR calc non Af Amer: >60 mL/min (ref >60)
Glucose, Bld: 134 mg/dL — ABNORMAL HIGH (ref 70–99)
Potassium: 4.7 mmol/L (ref 3.5–5.1)
Sodium: 139 mmol/L (ref 135–145)

## 2020-02-04 LAB — CBC
HCT: 30.2 % — ABNORMAL LOW (ref 36.0–46.0)
Hemoglobin: 9.1 g/dL — ABNORMAL LOW (ref 12.0–15.0)
MCH: 24.5 pg — ABNORMAL LOW (ref 26.0–34.0)
MCHC: 30.1 g/dL (ref 30.0–36.0)
MCV: 81.4 fL (ref 80.0–100.0)
Platelets: 149 10*3/uL — ABNORMAL LOW (ref 150–400)
RBC: 3.71 MIL/uL — ABNORMAL LOW (ref 3.87–5.11)
RDW: 17.4 % — ABNORMAL HIGH (ref 11.5–15.5)
WBC: 10.7 10*3/uL — ABNORMAL HIGH (ref 4.0–10.5)
nRBC: 0 % (ref 0.0–0.2)

## 2020-02-04 LAB — GLUCOSE, CAPILLARY
Glucose-Capillary: 122 mg/dL — ABNORMAL HIGH (ref 70–99)
Glucose-Capillary: 136 mg/dL — ABNORMAL HIGH (ref 70–99)
Glucose-Capillary: 136 mg/dL — ABNORMAL HIGH (ref 70–99)
Glucose-Capillary: 138 mg/dL — ABNORMAL HIGH (ref 70–99)
Glucose-Capillary: 240 mg/dL — ABNORMAL HIGH (ref 70–99)
Glucose-Capillary: 85 mg/dL (ref 70–99)

## 2020-02-04 NOTE — Progress Notes (Addendum)
      [address]       [place] [address]             [phone]      3 Days Post-Op Procedure(s) (LRB): CORONARY ARTERY BYPASS GRAFTING (CABG) using LIMA to LAD; Endovein Harvested Greater Saphenous Vein: SVG to Diag1; SVG to OM1. (N/A) TRANSESOPHAGEAL ECHOCARDIOGRAM (TEE) (N/A) ENDOVEIN HARVEST OF GREATER SAPHENOUS VEIN (Left) Subjective: Having some incisional pain this morning. Now, eating breakfast.   Objective: Vital signs in last 24 hours: Temp:  [98.3 F (36.8 C)-99.7 F (37.6 C)] 98.3 F (36.8 C) (07/18 0744) Pulse Rate:  [67-74] 74 (07/18 0744) Cardiac Rhythm: Normal sinus rhythm (07/18 0819) Resp:  [16-23] 23 (07/18 0744) BP: (121-151)/(52-68) 135/58 (07/18 0744) SpO2:  [92 %-100 %] 92 % (07/18 0744) Weight:  [56.7 kg] 56.7 kg (07/18 0431)     Intake/Output from previous day: 07/17 0701 - 07/18 0700 In: 460 [P.O.:360; IV Piggyback:100] Out: -  Intake/Output this shift: No intake/output data recorded.  General appearance: alert, cooperative and no distress Heart: regular rate and rhythm, S1, S2 normal, no murmur, click, rub or gallop Lungs: clear to auscultation bilaterally Abdomen: soft, non-tender; bowel sounds normal; no masses,  no organomegaly Extremities: extremities normal, atraumatic, no cyanosis or edema Wound: clean and dry dressed with a sterile dressing  Lab Results: Recent Labs    02/03/20 0256 02/04/20 0351  WBC 11.0* 10.7*  HGB 9.1* 9.1*  HCT 30.4* 30.2*  PLT 127* 149*   BMET:  Recent Labs    02/03/20 0256 02/04/20 0351  NA 135 139  K 4.4 4.7  CL 106 108  CO2 21* 23  GLUCOSE 148* 134*  BUN 13 12  CREATININE 0.94 0.74  CALCIUM 9.6 9.9    PT/INR:  Recent Labs    02/01/20 1846  LABPROT 14.9  INR 1.2   ABG    Component Value Date/Time   PHART 7.308 (L) 02/01/2020 2322   HCO3 17.3 (L) 02/01/2020 2322   TCO2 18 (L) 02/01/2020 2322   ACIDBASEDEF 8.0 (H) 02/01/2020 2322   O2SAT 96.0 02/01/2020 2322     CBG (last 3)  Recent Labs    02/04/20 0042 02/04/20 0433 02/04/20 0835  GLUCAP 85 122* 136*    Assessment/Plan: S/P Procedure(s) (LRB): CORONARY ARTERY BYPASS GRAFTING (CABG) using LIMA to LAD; Endovein Harvested Greater Saphenous Vein: SVG to Diag1; SVG to OM1. (N/A) TRANSESOPHAGEAL ECHOCARDIOGRAM (TEE) (N/A) ENDOVEIN HARVEST OF GREATER SAPHENOUS VEIN (Left)  1. CV-NSR in the 70s, BP well controlled. Continue asa, statin allergy.  2. Pulm- CXR reviewed with small left pleural effusion. Tolerating 2L [place] with good oxygen saturation.  3. Renal-creatinine 0.74, electrolytes okay, about 2k over baseline. Continue lasix PO.  4. Endo-blood glucose well controlled. Continue SSI and Levemir coverage.  5. H and H 9.1/30.2, expected acute blood loss anemia 6. Lovenox for DVT prophylaxis  Plan: Continue diuresis for fluid overload. Continue to wean oxygen as tolerated. Continue to work on pain control. Encouraged to ambulate in the halls and use incentive spirometer.    LOS: 9 days    Elgie Collard 02/04/2020   I have seen and examined the patient and agree with the assessment and plan as outlined.  Rexene Alberts, MD 02/04/2020 12:54 PM

## 2020-02-04 NOTE — Anesthesia Postprocedure Evaluation (Signed)
Anesthesia Post Note  Patient: Nathasha Fiorillo Ordaz  Procedure(s) Performed: CORONARY ARTERY BYPASS GRAFTING (CABG) using LIMA to LAD; Endovein Harvested Greater Saphenous Vein: SVG to Diag1; SVG to OM1. (N/A Chest) TRANSESOPHAGEAL ECHOCARDIOGRAM (TEE) (N/A ) ENDOVEIN HARVEST OF GREATER SAPHENOUS VEIN (Left Leg Upper)     Patient location during evaluation: SICU Anesthesia Type: General Level of consciousness: sedated and patient remains intubated per anesthesia plan Pain management: pain level controlled Vital Signs Assessment: post-procedure vital signs reviewed and stable Respiratory status: patient remains intubated per anesthesia plan and patient on ventilator - see flowsheet for VS Cardiovascular status: stable Postop Assessment: no apparent nausea or vomiting Anesthetic complications: no   No complications documented.  Last Vitals:  Vitals:   02/04/20 1708 02/04/20 2055  BP: (!) 148/69 138/78  Pulse: 78 77  Resp: 11 17  Temp: 36.8 C 37.3 C  SpO2: 98% 97%    Last Pain:  Vitals:   02/04/20 2059  TempSrc: Oral  PainSc:                  Shamel Galyean COKER

## 2020-02-04 NOTE — Progress Notes (Signed)
Mobility Specialist - Progress Note   02/04/20 1308  Mobility  Activity Ambulated in hall  Level of Assistance Modified independent, requires aide device or extra time  Assistive Device Front wheel walker  Distance Ambulated (ft) 70 ft  Mobility Response Tolerated fair  Mobility performed by Mobility specialist  $Mobility charge 1 Mobility    Pre-mobility: 77 HR, 93% SpO2 During mobility: 81 HR, 85% SpO2 Post-mobility: 72 HR, 95% SpO2  When I entered, the pt had her ECG cord unplugged and her nasal cannula off. She asked to ambulate on RA, but her oxygen quickly dropped to 85% and she began breathing more rapidly. When she was sat down and  put on 2 L/min of O2, she quickly rose to 95% SpO2.  Pricilla Handler Mobility Specialist Mobility Specialist Phone: [phone]

## 2020-02-05 LAB — POCT I-STAT 7, (LYTES, BLD GAS, ICA,H+H)
Acid-Base Excess: 0 mmol/L (ref 0.0–2.0)
Acid-Base Excess: 1 mmol/L (ref 0.0–2.0)
Acid-Base Excess: 2 mmol/L (ref 0.0–2.0)
Acid-base deficit: 2 mmol/L (ref 0.0–2.0)
Acid-base deficit: 1 mmol/L (ref 0.0–2.0)
Bicarbonate: 24.4 mmol/L (ref 20.0–28.0)
Bicarbonate: 22.7 mmol/L (ref 20.0–28.0)
Bicarbonate: 25.6 mmol/L (ref 20.0–28.0)
Bicarbonate: 25.8 mmol/L (ref 20.0–28.0)
Bicarbonate: 22.8 mmol/L (ref 20.0–28.0)
Calcium, Ion: 1.44 mmol/L — ABNORMAL HIGH (ref 1.15–1.40)
Calcium, Ion: 1.42 mmol/L — ABNORMAL HIGH (ref 1.15–1.40)
Calcium, Ion: 0.98 mmol/L — ABNORMAL LOW (ref 1.15–1.40)
Calcium, Ion: 1.18 mmol/L (ref 1.15–1.40)
Calcium, Ion: 1.58 mmol/L (ref 1.15–1.40)
HCT: 29 % — ABNORMAL LOW (ref 36.0–46.0)
HCT: 28 % — ABNORMAL LOW (ref 36.0–46.0)
HCT: 20 % — ABNORMAL LOW (ref 36.0–46.0)
HCT: 20 % — ABNORMAL LOW (ref 36.0–46.0)
HCT: 24 % — ABNORMAL LOW (ref 36.0–46.0)
Hemoglobin: 9.9 g/dL — ABNORMAL LOW (ref 12.0–15.0)
Hemoglobin: 9.5 g/dL — ABNORMAL LOW (ref 12.0–15.0)
Hemoglobin: 6.8 g/dL — CL (ref 12.0–15.0)
Hemoglobin: 6.8 g/dL — CL (ref 12.0–15.0)
Hemoglobin: 8.2 g/dL — ABNORMAL LOW (ref 12.0–15.0)
O2 Saturation: 100 %
O2 Saturation: 100 %
O2 Saturation: 100 %
O2 Saturation: 100 %
O2 Saturation: 100 %
Potassium: 3.8 mmol/L (ref 3.5–5.1)
Potassium: 3.9 mmol/L (ref 3.5–5.1)
Potassium: 4.3 mmol/L (ref 3.5–5.1)
Potassium: 4.9 mmol/L (ref 3.5–5.1)
Potassium: 4.2 mmol/L (ref 3.5–5.1)
Sodium: 142 mmol/L (ref 135–145)
Sodium: 140 mmol/L (ref 135–145)
Sodium: 142 mmol/L (ref 135–145)
Sodium: 139 mmol/L (ref 135–145)
Sodium: 140 mmol/L (ref 135–145)
TCO2: 26 mmol/L (ref 22–32)
TCO2: 24 mmol/L (ref 22–32)
TCO2: 27 mmol/L (ref 22–32)
TCO2: 27 mmol/L (ref 22–32)
TCO2: 24 mmol/L (ref 22–32)
pCO2 arterial: 38.8 mmHg (ref 32.0–48.0)
pCO2 arterial: 37.8 mmHg (ref 32.0–48.0)
pCO2 arterial: 38.1 mmHg (ref 32.0–48.0)
pCO2 arterial: 35.8 mmHg (ref 32.0–48.0)
pCO2 arterial: 34.2 mmHg (ref 32.0–48.0)
pH, Arterial: 7.407 (ref 7.350–7.450)
pH, Arterial: 7.388 (ref 7.350–7.450)
pH, Arterial: 7.435 (ref 7.350–7.450)
pH, Arterial: 7.465 — ABNORMAL HIGH (ref 7.350–7.450)
pH, Arterial: 7.432 (ref 7.350–7.450)
pO2, Arterial: 301 mmHg — ABNORMAL HIGH (ref 83.0–108.0)
pO2, Arterial: 208 mmHg — ABNORMAL HIGH (ref 83.0–108.0)
pO2, Arterial: 418 mmHg — ABNORMAL HIGH (ref 83.0–108.0)
pO2, Arterial: 231 mmHg — ABNORMAL HIGH (ref 83.0–108.0)
pO2, Arterial: 419 mmHg — ABNORMAL HIGH (ref 83.0–108.0)

## 2020-02-05 LAB — POCT I-STAT, CHEM 8
BUN: 6 mg/dL — ABNORMAL LOW (ref 8–23)
BUN: 9 mg/dL (ref 8–23)
BUN: 9 mg/dL (ref 8–23)
BUN: 7 mg/dL — ABNORMAL LOW (ref 8–23)
BUN: 9 mg/dL (ref 8–23)
BUN: 9 mg/dL (ref 8–23)
BUN: 9 mg/dL (ref 8–23)
Calcium, Ion: 1.18 mmol/L (ref 1.15–1.40)
Calcium, Ion: 1.39 mmol/L (ref 1.15–1.40)
Calcium, Ion: 1.39 mmol/L (ref 1.15–1.40)
Calcium, Ion: 0.95 mmol/L — ABNORMAL LOW (ref 1.15–1.40)
Calcium, Ion: 1.16 mmol/L (ref 1.15–1.40)
Calcium, Ion: 1.15 mmol/L (ref 1.15–1.40)
Calcium, Ion: 1.55 mmol/L (ref 1.15–1.40)
Chloride: 107 mmol/L (ref 98–111)
Chloride: 108 mmol/L (ref 98–111)
Chloride: 114 mmol/L — ABNORMAL HIGH (ref 98–111)
Chloride: 101 mmol/L (ref 98–111)
Chloride: 104 mmol/L (ref 98–111)
Chloride: 104 mmol/L (ref 98–111)
Chloride: 107 mmol/L (ref 98–111)
Creatinine, Ser: 0.3 mg/dL — ABNORMAL LOW (ref 0.44–1.00)
Creatinine, Ser: 0.6 mg/dL (ref 0.44–1.00)
Creatinine, Ser: 0.6 mg/dL (ref 0.44–1.00)
Creatinine, Ser: 0.4 mg/dL — ABNORMAL LOW (ref 0.44–1.00)
Creatinine, Ser: 0.6 mg/dL (ref 0.44–1.00)
Creatinine, Ser: 0.5 mg/dL (ref 0.44–1.00)
Creatinine, Ser: 0.4 mg/dL — ABNORMAL LOW (ref 0.44–1.00)
Glucose, Bld: 120 mg/dL — ABNORMAL HIGH (ref 70–99)
Glucose, Bld: 135 mg/dL — ABNORMAL HIGH (ref 70–99)
Glucose, Bld: 94 mg/dL (ref 70–99)
Glucose, Bld: 110 mg/dL — ABNORMAL HIGH (ref 70–99)
Glucose, Bld: 142 mg/dL — ABNORMAL HIGH (ref 70–99)
Glucose, Bld: 174 mg/dL — ABNORMAL HIGH (ref 70–99)
Glucose, Bld: 156 mg/dL — ABNORMAL HIGH (ref 70–99)
HCT: 20 % — ABNORMAL LOW (ref 36.0–46.0)
HCT: 26 % — ABNORMAL LOW (ref 36.0–46.0)
HCT: 30 % — ABNORMAL LOW (ref 36.0–46.0)
HCT: 21 % — ABNORMAL LOW (ref 36.0–46.0)
HCT: 23 % — ABNORMAL LOW (ref 36.0–46.0)
HCT: 24 % — ABNORMAL LOW (ref 36.0–46.0)
HCT: 23 % — ABNORMAL LOW (ref 36.0–46.0)
Hemoglobin: 10.2 g/dL — ABNORMAL LOW (ref 12.0–15.0)
Hemoglobin: 6.8 g/dL — CL (ref 12.0–15.0)
Hemoglobin: 8.8 g/dL — ABNORMAL LOW (ref 12.0–15.0)
Hemoglobin: 7.1 g/dL — ABNORMAL LOW (ref 12.0–15.0)
Hemoglobin: 7.8 g/dL — ABNORMAL LOW (ref 12.0–15.0)
Hemoglobin: 8.2 g/dL — ABNORMAL LOW (ref 12.0–15.0)
Hemoglobin: 7.8 g/dL — ABNORMAL LOW (ref 12.0–15.0)
Potassium: 2.6 mmol/L — CL (ref 3.5–5.1)
Potassium: 3.7 mmol/L (ref 3.5–5.1)
Potassium: 3.9 mmol/L (ref 3.5–5.1)
Potassium: 4.2 mmol/L (ref 3.5–5.1)
Potassium: 4.2 mmol/L (ref 3.5–5.1)
Potassium: 4.7 mmol/L (ref 3.5–5.1)
Potassium: 4.3 mmol/L (ref 3.5–5.1)
Sodium: 141 mmol/L (ref 135–145)
Sodium: 141 mmol/L (ref 135–145)
Sodium: 147 mmol/L — ABNORMAL HIGH (ref 135–145)
Sodium: 141 mmol/L (ref 135–145)
Sodium: 142 mmol/L (ref 135–145)
Sodium: 139 mmol/L (ref 135–145)
Sodium: 140 mmol/L (ref 135–145)
TCO2: 16 mmol/L — ABNORMAL LOW (ref 22–32)
TCO2: 21 mmol/L — ABNORMAL LOW (ref 22–32)
TCO2: 21 mmol/L — ABNORMAL LOW (ref 22–32)
TCO2: 23 mmol/L (ref 22–32)
TCO2: 24 mmol/L (ref 22–32)
TCO2: 23 mmol/L (ref 22–32)
TCO2: 24 mmol/L (ref 22–32)

## 2020-02-05 LAB — GLUCOSE, CAPILLARY
Glucose-Capillary: 86 mg/dL (ref 70–99)
Glucose-Capillary: 98 mg/dL (ref 70–99)
Glucose-Capillary: 133 mg/dL — ABNORMAL HIGH (ref 70–99)

## 2020-02-05 MED ORDER — ASPIRIN 325 MG PO TBEC: 325.0000 mg | DELAYED_RELEASE_TABLET | Freq: Every day | ORAL | Status: DC

## 2020-02-05 MED ORDER — NEBIVOLOL HCL 10 MG PO TABS
25.0000 mg | ORAL_TABLET | Freq: Every day | ORAL | Status: DC
  Administered 2020-02-05: 25 mg via ORAL
  Filled 2020-02-05: qty 3

## 2020-02-05 MED ORDER — TRAMADOL HCL 50 MG PO TABS: 50.0000 mg | ORAL_TABLET | Freq: Four times a day (QID) | ORAL | 0 refills | Status: DC | PRN

## 2020-02-05 NOTE — Progress Notes (Signed)
CARDIAC REHAB PHASE I   Offered to walk with pt, pt stating she is leaving shortly. D/c education completed with pt. Pt educated on importance of site care and monitoring incision daily. Encouraged continued IS use, walks, and sternal precautions. Pt given in-the-tube sheet along with heart healthy and diabetic diets. Reviewed restrictions and exercise guidelines. Will refer to CRP II [place].  [phone] Rufina Falco, RN BSN 02/05/2020 9:47 AM

## 2020-02-05 NOTE — Progress Notes (Signed)
[address]       [place] [address]             [phone]      4 Days Post-Op Procedure(s) (LRB): CORONARY ARTERY BYPASS GRAFTING (CABG) using LIMA to LAD; Endovein Harvested Greater Saphenous Vein: SVG to Diag1; SVG to OM1. (N/A) TRANSESOPHAGEAL ECHOCARDIOGRAM (TEE) (N/A) ENDOVEIN HARVEST OF GREATER SAPHENOUS VEIN (Left) Subjective: Feels well  Objective: Vital signs in last 24 hours: Temp:  [98.2 F (36.8 C)-99.1 F (37.3 C)] 98.5 F (36.9 C) (07/19 0808) Pulse Rate:  [69-81] 81 (07/19 0808) Cardiac Rhythm: Normal sinus rhythm (07/19 0802) Resp:  [11-20] 20 (07/19 0808) BP: (112-148)/(56-78) 147/68 (07/19 0808) SpO2:  [92 %-98 %] 97 % (07/19 0808)  Hemodynamic parameters for last 24 hours:    Intake/Output from previous day: 07/18 0701 - 07/19 0700 In: 140 [P.O.:140] Out: -  Intake/Output this shift: No intake/output data recorded.  General appearance: alert, cooperative and no distress Heart: regular rate and rhythm Lungs: clear to auscultation bilaterally Abdomen: benign Extremities: no edema Wound: incis healing well  Lab Results: Recent Labs    02/03/20 0256 02/04/20 0351  WBC 11.0* 10.7*  HGB 9.1* 9.1*  HCT 30.4* 30.2*  PLT 127* 149*   BMET:  Recent Labs    02/03/20 0256 02/04/20 0351  NA 135 139  K 4.4 4.7  CL 106 108  CO2 21* 23  GLUCOSE 148* 134*  BUN 13 12  CREATININE 0.94 0.74  CALCIUM 9.6 9.9    PT/INR: No results for input(s): LABPROT, INR in the last 72 hours. ABG    Component Value Date/Time   PHART 7.308 (L) 02/01/2020 2322   HCO3 17.3 (L) 02/01/2020 2322   TCO2 18 (L) 02/01/2020 2322   ACIDBASEDEF 8.0 (H) 02/01/2020 2322   O2SAT 96.0 02/01/2020 2322   CBG (last 3)  Recent Labs    02/04/20 2056 02/05/20 0038 02/05/20 0436  GLUCAP 136* 86 98    Meds Scheduled Meds: . acetaminophen  1,000 mg Oral Q6H  . aspirin EC  325 mg Oral Daily  . bisacodyl  10 mg Oral Daily   Or  . bisacodyl   10 mg Rectal Daily  . Chlorhexidine Gluconate Cloth  6 each Topical Daily  . docusate sodium  200 mg Oral Daily  . enoxaparin (LOVENOX) injection  40 mg Subcutaneous QHS  . furosemide  40 mg Oral Daily  . insulin aspart  0-24 Units Subcutaneous Q4H  . insulin detemir  10 Units Subcutaneous Daily  . lidocaine  2 patch Transdermal Q24H  . methocarbamol  500 mg Oral TID  . metoprolol tartrate  12.5 mg Oral BID  . pantoprazole  40 mg Oral Daily  . potassium chloride  20 mEq Oral Daily  . sodium chloride flush  10-40 mL Intracatheter Q12H  . sodium chloride flush  3 mL Intravenous Q12H   Continuous Infusions: . sodium chloride    . lactated ringers     PRN Meds:.dextrose, metoprolol tartrate, morphine injection, ondansetron (ZOFRAN) IV, oxyCODONE, sodium chloride flush, sodium chloride flush, traMADol  Xrays DG Chest 2 View  Result Date: 02/04/2020 CLINICAL DATA:  Atelectasis EXAM: CHEST - 2 VIEW COMPARISON:  Chest radiograph 02/03/2020 FINDINGS: Stable cardiomediastinal contours status post median sternotomy and CABG. There are persistent bandlike opacities at the left lung base likely reflecting atelectasis. The right lung is clear. Small left pleural effusion. No pneumothorax. Unchanged small lucency overlying the base of  the heart may be postoperative. No acute finding in the visualized skeleton. IMPRESSION: 1. Persistent bandlike opacities at the left lung base likely reflecting atelectasis. Small left pleural effusion. 2. Redemonstrated small amount of air overlying the base of the heart may be postoperative. Electronically Signed   By: Audie Pinto M.D.   On: 02/04/2020 15:32    Assessment/Plan: S/P Procedure(s) (LRB): CORONARY ARTERY BYPASS GRAFTING (CABG) using LIMA to LAD; Endovein Harvested Greater Saphenous Vein: SVG to Diag1; SVG to OM1. (N/A) TRANSESOPHAGEAL ECHOCARDIOGRAM (TEE) (N/A) ENDOVEIN HARVEST OF GREATER SAPHENOUS VEIN (Left)   1 conts to do well 2 VSS with  some HTN,will d/c metoprolol and resume previous bystolic 3 sats food on RA 4 sinus rhythm 5 no new labs 6 syable for d/c   LOS: 10 days    John Giovanni PA-C  Pager [phone] 02/05/2020

## 2020-02-05 NOTE — Discharge Instructions (Signed)
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 Your doctor may also give you more specific instructions. If you have problems or questions, call your doctor. What can I expect after the procedure? After the procedure, it is common to:  Feel sick to your stomach (nauseous).  Not want to eat as much as normal (lack of appetite).  Have trouble pooping (constipation).  Have weakness and tiredness (fatigue).  Feel sad (depressed) or grouchy (irritable).  Have pain or discomfort around the cuts from surgery (incisions). Follow these instructions at home: Medicines  Take over-the-counter and prescription medicines only as told by your doctor. Do not stop taking medicines or start any new medicines unless your doctor says it is okay.  If you were prescribed an antibiotic medicine, take it as told by your doctor. Do not stop taking the antibiotic even if you start to feel better. Incision care   Follow instructions from your doctor about how to take care of your cuts from surgery. Make sure you: ? Wash your hands with soap and water before and after you change your bandage (dressing). If you cannot use soap and water, use hand sanitizer. ? Change your bandage as told by your doctor. ? Leave stitches (sutures), skin glue, or skin tape (adhesive) strips in place. They may need to stay in place for 2 weeks or longer. If tape strips get loose and curl up, you may trim the loose edges. Do not remove tape strips completely unless your doctor says it is okay.  Make sure the surgery cuts are clean, dry, and protected.  Check your cut areas every day for signs of infection. Check for: ? More redness, swelling, or pain. ? More fluid or blood. ? Warmth. ? Pus or a bad smell.  If cuts were made in your legs: ? Avoid crossing your legs. ? Avoid sitting for long periods of time. Change positions every 30  minutes. ? Raise (elevate) your legs when you are sitting. Bathing  Do not take baths, swim, or use a hot tub until your doctor says it is okay.  You may shower. Pat the surgery cuts dry. Do not rub the cuts to dry.  Eating and drinking   Eat foods that are high in fiber, such as beans, nuts, whole grains, and raw fruits and vegetables. Any meats you eat should be lean cut. Avoid canned, processed, and fried foods. This can help prevent trouble pooping. This is also a part of a heart-healthy diet.  Drink enough fluid to keep your pee (urine) pale yellow.  Do not drink alcohol until you are fully recovered. Ask your doctor when it is safe to drink alcohol. Activity  Rest and limit your activity as told by your doctor. You may be told to: ? Stop any activity right away if you have chest pain, shortness of breath, irregular heartbeats, or dizziness. Get help right away if you have any of these symptoms. ? Move around often for short periods or take short walks as told by your doctor. Slowly increase your activities. ? Avoid lifting, pushing, or pulling anything that is heavier than 10 lb (4.5 kg) for at least 6 weeks or as told by your doctor.  Do physical therapy or a cardiac rehab (cardiac rehabilitation) program as told by your doctor. ? Physical therapy involves doing exercises to maintain movement and build strength and endurance. ? A cardiac rehab program includes:  Exercise   training.  Education.  Counseling.  Do not drive until your doctor says it is okay.  Ask your doctor when you can go back to work.  Ask your doctor when you can be sexually active. General instructions  Do not drive or use heavy machinery while taking prescription pain medicine.  Do not use any products that contain nicotine or tobacco. These include cigarettes, e-cigarettes, and chewing tobacco. If you need help quitting, ask your doctor.  Take 2-3 deep breaths every few hours during the day while  you get better. This helps expand your lungs and prevent problems.  If you were given a device called an incentive spirometer, use it several times a day to practice deep breathing. Support your chest with a pillow or your arms when you take deep breaths or cough.  Wear compression stockings as told by your doctor.  Weigh yourself every day. This helps to see if your body is holding (retaining) fluid that may make your heart and lungs work harder.  Keep all follow-up visits as told by your doctor. This is important. Contact a doctor if:  You have more redness, swelling, or pain around any cut.  You have more fluid or blood coming from any cut.  Any cut feels warm to the touch.  You have pus or a bad smell coming from any cut.  You have a fever.  You have swelling in your ankles or legs.  You have pain in your legs.  You gain 2 lb (0.9 kg) or more a day.  You feel sick to your stomach or you throw up (vomit).  You have watery poop (diarrhea). Get help right away if:  You have chest pain that goes to your jaw or arms.  You are short of breath.  You have a fast or irregular heartbeat.  You notice a "clicking" in your breastbone (sternum) when you move.  You have any signs of a stroke. "BE FAST" is an easy way to remember the main warning signs: ? B - Balance. Signs are dizziness, sudden trouble walking, or loss of balance. ? E - Eyes. Signs are trouble seeing or a change in how you see. ? F - Face. Signs are sudden weakness or loss of feeling of the face, or the face or eyelid drooping on one side. ? A - Arms. Signs are weakness or loss of feeling in an arm. This happens suddenly and usually on one side of the body. ? S - Speech. Signs are sudden trouble speaking, slurred speech, or trouble understanding what people say. ? T - Time. Time to call emergency services. Write down what time symptoms started.  You have other signs of a stroke, such as: ? A sudden, very bad  headache with no known cause. ? Feeling sick to your stomach. ? Throwing up. ? Jerky movements you cannot control (seizure). These symptoms may be an emergency. Do not wait to see if the symptoms will go away. Get medical help right away. Call your local emergency services (911 in the [place] Do not drive yourself to the hospital. Summary  After the procedure, it is common to have pain or discomfort in the cuts from surgery (incisions).  Do not take baths, swim, or use a hot tub until your doctor says it is okay.  Slowly increase your activities. You may need physical therapy or cardiac rehab.  Weigh yourself every day. This helps to see if your body is holding fluid. This information is not intended  to replace advice given to you by your health care provider. Make sure you discuss any questions you have with your health care provider. Document Revised: 03/15/2018 Document Reviewed: 03/15/2018 Else[employer] Patient Education  [employer]. Discharge Instructions:  1. You may shower, please wash incisions daily with soap and water and keep dry.  If you wish to cover wounds with dressing you may do so but please keep clean and change daily.  No tub baths or swimming until incisions have completely healed.  If your incisions become red or develop any drainage please call our office at [phone]  2. No Driving until cleared by Dr. Abran Duke office and you are no longer using narcotic pain medications  3. Monitor your weight daily.. Please use the same scale and weigh at same time... If you gain 5-10 lbs in 48 hours with associated lower extremity swelling, please contact our office at [phone]  4. Fever of 101.5 for at least 24 hours with no source, please contact our office at [phone]  5. Activity- up as tolerated, please walk at least 3 times per day.  Avoid strenuous activity, no lifting, pushing, or pulling with your arms over 8-10 lbs for a minimum of 6 weeks  6. If any  questions or concerns arise, please do not hesitate to contact our office at [phone]

## 2020-02-05 NOTE — Plan of Care (Signed)
DISCHARGE NOTE HOME Faith Gaines to be discharged home per MD order. Discussed prescriptions and follow up appointments with the patient. Prescriptions given to patient; medication list explained in detail. Patient verbalized understanding.  Skin clean, dry and intact without evidence of skin break down, no evidence of skin tears noted. IV catheter discontinued intact. Site without signs and symptoms of complications. Dressing and pressure applied. Pt denies pain at the site currently. No complaints noted.  Patient free of lines, drains, and wounds.   An After Visit Summary (AVS) was printed and given to the patient. Patient escorted via wheelchair, and discharged home via private auto.  Stephan Minister, RN

## 2020-02-05 NOTE — Progress Notes (Signed)
Pt on room air resting in bed - O2 sat 92%.

## 2020-02-08 ENCOUNTER — Telehealth: Payer: Self-pay | Admitting: Cardiology

## 2020-02-08 ENCOUNTER — Telehealth (HOSPITAL_COMMUNITY): Payer: Self-pay

## 2020-02-08 MED FILL — Calcium Chloride Inj 10%: INTRAVENOUS | Qty: 10 | Status: AC

## 2020-02-08 MED FILL — Sodium Chloride IV Soln 0.9%: INTRAVENOUS | Qty: 2000 | Status: AC

## 2020-02-08 MED FILL — Mannitol IV Soln 20%: INTRAVENOUS | Qty: 500 | Status: AC

## 2020-02-08 MED FILL — Heparin Sodium (Porcine) Inj 1000 Unit/ML: INTRAMUSCULAR | Qty: 10 | Status: AC

## 2020-02-08 MED FILL — Electrolyte-R (PH 7.4) Solution: INTRAVENOUS | Qty: 4000 | Status: AC

## 2020-02-08 MED FILL — Sodium Bicarbonate IV Soln 8.4%: INTRAVENOUS | Qty: 50 | Status: AC

## 2020-02-08 NOTE — Telephone Encounter (Signed)
Patient's daughter Faith Gaines calling stating the patient's oxygen hasn't gone above 88 in the last 24 hr. She states when she came home from the hospital she stayed at 21. Please advise.

## 2020-02-08 NOTE — Telephone Encounter (Signed)
Faxed referral for Phase II cardiac rehab to [place]. °

## 2020-02-08 NOTE — Telephone Encounter (Signed)
Spoke with pt's daughter who report during hospitalization pt required supplemental oxygen. She state they were told pt O2 needed to maintain above a certain number without oxygen prior to discharge. Daughter report O2 never got to that level, but pt was discharged anyway. She report since discharge, pt has maintained around 91 but last night and today has not reached above 88. She report pt is c/o SOB and easily winded.  She also report numbness in left hand and was told would resolve over time but has not.

## 2020-02-08 NOTE — Telephone Encounter (Signed)
Pt's daughter updated with MD's recommendations and verbalized understanding.

## 2020-02-08 NOTE — Telephone Encounter (Signed)
If her oxygen saturations are low, she may need to come to ER to be seen and evaluated. I found the following note in the chart from her admission from the nurse technician: Pre-mobility: 77 HR, 93% SpO2 During mobility: 81 HR, 85% SpO2 Post-mobility: 72 HR, 95% SpO2  This suggests she may need oxygen to maintain her saturations. Unfortunately we can't order this without having her either seen in the office and walked or seen in the hospital. If she is very short of breath and easily winded, I would recommend having her come to ER for chest x-ray, bloodwork, and to see if this is a fluid issue or some other cause.

## 2020-02-16 ENCOUNTER — Ambulatory Visit (INDEPENDENT_AMBULATORY_CARE_PROVIDER_SITE_OTHER): Payer: Self-pay | Admitting: Thoracic Surgery (Cardiothoracic Vascular Surgery)

## 2020-02-16 ENCOUNTER — Other Ambulatory Visit: Payer: Self-pay

## 2020-02-16 ENCOUNTER — Encounter: Payer: Self-pay | Admitting: Thoracic Surgery (Cardiothoracic Vascular Surgery)

## 2020-02-16 VITALS — BP 114/71 | HR 85 | Temp 97.7°F | Resp 20 | Ht 61.0 in | Wt 114.0 lb

## 2020-02-16 DIAGNOSIS — Z951 Presence of aortocoronary bypass graft: Secondary | ICD-10-CM

## 2020-02-16 MED ORDER — OXYCODONE-ACETAMINOPHEN 5-325 MG PO TABS: 1.0000 | ORAL_TABLET | Freq: Four times a day (QID) | ORAL | 0 refills | Status: DC | PRN

## 2020-02-16 MED ORDER — PREGABALIN 25 MG PO CAPS
25.0000 mg | ORAL_CAPSULE | Freq: Two times a day (BID) | ORAL | 0 refills | Status: DC
Start: 2020-02-16 — End: 2020-07-26

## 2020-02-16 NOTE — Progress Notes (Signed)
      [address]       [place] [address]             [phone]        Raigen Almond Chewey Record #[id number] Date of Birth: [birth year]  Referring: Minus Breeding, MD Primary Care: Maury Dus, MD Primary Cardiologist:No primary care provider on file.  Reason for visit:   follow-up  History of Present Illness:     Ms. Demo comes in for her first follow-up appointment.  Overall she is doing well but she does have some paresthesias in her left pinky finger, tricep and shoulder.  She also complains of some tingling along her left breast, and paresthesias along both of her feet.  Physical Exam: BP 114/71   Pulse 85   Temp 97.7 F (36.5 C) (Skin)   Resp 20   Ht 5\' 1"  (1.549 m)   Wt 114 lb (51.7 kg)   SpO2 98% Comment: RA  BMI 21.54 kg/m   Alert NAD Incision clean.  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 Her left arm pain appears neuropathic, and is unclear if it is related to her surgery.  I have given her a prescription for Lyrica.  Also she states that the tramadol is not working thus I will change her medication over to Percocet.  I will see her back in 1 month with a chest x-ray.   Lajuana Matte 02/16/2020 6:30 PM

## 2020-02-23 ENCOUNTER — Ambulatory Visit
Admission: RE | Admit: 2020-02-23 | Discharge: 2020-02-23 | Disposition: A | Discharge status: Home / Self Care | Payer: Medicare Other | Source: Ambulatory Visit | Attending: Physician Assistant | Admitting: Physician Assistant

## 2020-02-23 ENCOUNTER — Other Ambulatory Visit: Payer: Self-pay

## 2020-02-23 ENCOUNTER — Encounter: Payer: Self-pay | Admitting: Physician Assistant

## 2020-02-23 ENCOUNTER — Ambulatory Visit (INDEPENDENT_AMBULATORY_CARE_PROVIDER_SITE_OTHER): Payer: Medicare Other | Admitting: Physician Assistant

## 2020-02-23 VITALS — BP 118/62 | Ht 61.0 in | Wt 105.0 lb

## 2020-02-23 DIAGNOSIS — Z9889 Other specified postprocedural states: Secondary | ICD-10-CM | POA: Diagnosis not present

## 2020-02-23 DIAGNOSIS — R5383 Other fatigue: Secondary | ICD-10-CM | POA: Diagnosis not present

## 2020-02-23 DIAGNOSIS — M79671 Pain in right foot: Secondary | ICD-10-CM | POA: Diagnosis not present

## 2020-02-23 DIAGNOSIS — R0609 Other forms of dyspnea: Secondary | ICD-10-CM

## 2020-02-23 DIAGNOSIS — I10 Essential (primary) hypertension: Secondary | ICD-10-CM | POA: Diagnosis not present

## 2020-02-23 DIAGNOSIS — I200 Unstable angina: Secondary | ICD-10-CM | POA: Diagnosis not present

## 2020-02-23 DIAGNOSIS — Z951 Presence of aortocoronary bypass graft: Secondary | ICD-10-CM | POA: Diagnosis not present

## 2020-02-23 DIAGNOSIS — M79672 Pain in left foot: Secondary | ICD-10-CM | POA: Diagnosis not present

## 2020-02-23 DIAGNOSIS — E785 Hyperlipidemia, unspecified: Secondary | ICD-10-CM | POA: Diagnosis not present

## 2020-02-23 DIAGNOSIS — Z9049 Acquired absence of other specified parts of digestive tract: Secondary | ICD-10-CM | POA: Diagnosis not present

## 2020-02-23 DIAGNOSIS — R0602 Shortness of breath: Secondary | ICD-10-CM

## 2020-02-23 DIAGNOSIS — R0600 Dyspnea, unspecified: Secondary | ICD-10-CM

## 2020-02-23 DIAGNOSIS — J90 Pleural effusion, not elsewhere classified: Secondary | ICD-10-CM | POA: Diagnosis not present

## 2020-02-23 NOTE — Progress Notes (Addendum)
Cardiology Office Note   Date:  02/23/2020   ID:  Faith, Gaines [birth year], MRN [id number]  PCP:  Maury Dus, MD Cardiologist:  Minus Breeding, MD 01/26/2020 Electrphysiologist: None Rosaria Ferries, PA-C   No chief complaint on file.   History of Present Illness: Faith Gaines is a 74 y.o. female with a history of CABG 02/01/2020 w/ LIMA-LAD, SVG-D1, SVG-OM3,  breast cancer, anxiety, CKD, Diabetes, HTN, Hyperlipidemia, and PVD  D/C 07/19 after CABG  Faith Gaines presents for cardiology follow up. Her daughter is with her.  She is still having problems with numbness in her L palm and L arm pain in her elbow and above. Also pain to L lateral breast. These sx have been present since the surgery.   She woke the other day with bilateral foot pain on the bottoms on her feet. This is new for her.   She tires easily, has to rest after she has been out a while. She is walking around at home and outside when the weather is not too hot.   No anginal symptoms w/ exertion.   No LE edema. No orthopnea or PND. However, she has had a cough since the surgery  Hx iron deficiency, has required iron infusions in the past.   No problems w/ her incision.   Her family stayed with her for a couple of weeks, but she is staying by herself now. That is going well.    Past Medical History:  Diagnosis Date  . Anxiety    no meds  . Breast cancer ([place]) 2015   left breast   . CKD (chronic kidney disease), stage III    no meds, blood draws q 65mos by Dr. Alyson Ingles in [place]  . Depression    no meds  . Diabetes mellitus without complication (Faith Gaines)    type 2  . GERD (gastroesophageal reflux disease)    occ  . Hyperlipidemia    diet controlled, no meds  . Hypertension   . Iron deficiency anemia   . Osteopenia   . Peripheral vascular disease (Faith Gaines)    rt carotid stenosis  . Pneumonia 12/2011, 05/2012   hx x2 in 2013  . PONV (postoperative nausea and  vomiting)   . Trigger finger    resolved with cortisone injections    Past Surgical History:  Procedure Laterality Date  . ANTERIOR AND POSTERIOR REPAIR N/A 10/22/2014   Procedure: CYSTO, REPAIR CYSTOCELE/RECTOCELE;  Surgeon: Bjorn Loser, MD;  Location: [facility];  Service: Urology;  Laterality: N/A;  . APPENDECTOMY    . BLADDER SURGERY  05/1981   bladder tack  . BREAST LUMPECTOMY Left 2015  . CHOLECYSTECTOMY  1995  . COLONOSCOPY    . CORONARY ARTERY BYPASS GRAFT N/A 02/01/2020   Procedure: CORONARY ARTERY BYPASS GRAFTING (CABG) using LIMA to LAD; Endovein Harvested Greater Saphenous Vein: SVG to Diag1; SVG to OM1.;  Surgeon: Lajuana Matte, MD;  Location: [facility];  Service: Open Heart Surgery;  Laterality: N/A;  . ENDARTERECTOMY Right 06/29/2013   Procedure: ENDARTERECTOMY CAROTID-RIGHT, WITH DACRON PATCH ANGIOPLASTY;  Surgeon: Mal Misty, MD;  Location: [facility];  Service: Vascular;  Laterality: Right;  . ENDOVEIN HARVEST OF GREATER SAPHENOUS VEIN Left 02/01/2020   Procedure: ENDOVEIN HARVEST OF GREATER SAPHENOUS VEIN;  Surgeon: Lajuana Matte, MD;  Location: [facility];  Service: Open Heart Surgery;  Laterality: Left;  . LAPAROSCOPIC ASSISTED VAGINAL HYSTERECTOMY N/A 10/22/2014   Procedure: LAPAROSCOPIC ASSISTED VAGINAL HYSTERECTOMY;  Surgeon: Marylynn Pearson, MD;  Location: [facility];  Service: Gynecology;  Laterality: N/A;  Pam from Dr. Mikle Gaines office will call and add additional surgery for this patient in our block time.  Marland Kitchen LEFT HEART CATH AND CORONARY ANGIOGRAPHY N/A 01/26/2020   Procedure: LEFT HEART CATH AND CORONARY ANGIOGRAPHY;  Surgeon: [name], MD;  Location: [facility];  Service: Cardiovascular;  Laterality: N/A;  . MELANOMA EXCISION  12/1980   left leg,  . PARTIAL MASTECTOMY WITH NEEDLE LOCALIZATION Left 08/22/2013   Procedure: PARTIAL MASTECTOMY WITH NEEDLE LOCALIZATION;  Surgeon: Stark Klein, MD;  Location: [facility];  Service: General;  Laterality: Left;    . SALPINGOOPHORECTOMY Bilateral 10/22/2014   Procedure: SALPINGO OOPHORECTOMY;  Surgeon: Marylynn Pearson, MD;  Location: [facility];  Service: Gynecology;  Laterality: Bilateral;  . 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 TEE WITHOUT CARDIOVERSION N/A 02/01/2020   Procedure: TRANSESOPHAGEAL ECHOCARDIOGRAM (TEE);  Surgeon: Lajuana Matte, MD;  Location: [facility];  Service: Open Heart Surgery;  Laterality: N/A;  . TUBAL LIGATION    . VAGINAL PROLAPSE REPAIR N/A 10/22/2014   Procedure: VAULT PROLAPSE WITH GRAFT;  Surgeon: Bjorn Loser, MD;  Location: [facility];  Service: Urology;  Laterality: N/A;  . WISDOM TOOTH EXTRACTION      Current Outpatient Medications  Medication Sig Dispense Refill  . aspirin EC 325 MG EC tablet Take 1 tablet (325 mg total) by mouth daily.    . Canagliflozin (INVOKANA) 300 MG TABS Take 300 mg by mouth daily.     . nebivolol (BYSTOLIC) 5 MG tablet Take 5 mg by mouth daily.    . Nebivolol HCl (BYSTOLIC) 20 MG TABS Take 1 tablet by mouth daily.    . Omega-3 Fatty Acids (OMEGA-3 2100) 1050 MG CAPS Take 1 capsule by mouth daily.     Marland Kitchen oxyCODONE-acetaminophen (PERCOCET/ROXICET) 5-325 MG tablet Take 1-2 tablets by mouth every 6 (six) hours as needed for severe pain. 50 tablet 0  . pantoprazole (PROTONIX) 40 MG tablet Take 40 mg by mouth daily.    . pregabalin (LYRICA) 25 MG capsule Take 1 capsule (25 mg total) by mouth 2 (two) times daily. 60 capsule 0  . Saxagliptin-Metformin (KOMBIGLYZE XR) 2.11-998 MG TB24 Take 1 tablet by mouth 2 (two) times daily. Reported on 08/21/2015    . VITAMIN D PO Take 1,000 Units by mouth daily.    . vitamin E 1000 UNIT capsule Take 1,000 Units by mouth daily.     No current facility-administered medications for this visit.    Allergies:   Codeine, Ace inhibitors, Clarithromycin, Crestor [rosuvastatin], Lipitor [atorvastatin], and Avandamet [rosiglitazone-metformin]    Social History:  The patient  reports that she quit smoking  about 9 years ago. Her smoking use included cigarettes. She has a 0.75 pack-year smoking history. She has never used smokeless tobacco. She reports that she does not drink alcohol and does not use drugs.   Family History:  The patient's family history includes Asthma in her father; Cancer in her father and mother; Diabetes in her father; Healthy in her daughter, daughter, and son; Heart attack (age of onset: 41) in her sister; Heart attack (age of onset: 58) in her father; Heart attack (age of onset: 72) in her mother; Heart disease in her father, mother, and sister; Hypertension in her father and mother; Kidney disease in her father; Stroke in her mother; Varicose Veins in her mother.  She indicated that her mother is deceased.  She indicated that her father is deceased. She indicated that all of her three sisters are alive. She indicated that both of her daughters are alive. She indicated that her son is alive.   ROS:  Please see the history of present illness. All other systems are reviewed and negative.    PHYSICAL EXAM: VS:  BP 118/62   Ht 5\' 1"  (1.549 m)   Wt 105 lb (47.6 kg)   BMI 19.84 kg/m  , BMI Body mass index is 19.84 kg/m. GEN: Well nourished, well developed, female in no acute distress HEENT: normal for age  Neck: no JVD, no carotid bruit, no masses Cardiac: RRR; no murmur, no rubs, or gallops Respiratory: rales L base, normal work of breathing GI: soft, nontender, nondistended, + BS MS: no deformity or atrophy; no edema; distal pulses are 2+ in all 4 extremities  Skin: warm and dry, no rash, incisions are healing well. Neuro:  Strength and sensation are intact Psych: euthymic mood, full affect   EKG:  EKG is ordered today. The ekg ordered today demonstrates sinus rhythm, possible atrial enlargement, left axis deviation, Q waves in the inferior leads plus V leads, similar to 7/16 ECG  ECHO: Intraoperative TEE 08/31/9415 Complications: No known complications during this  procedure.  POST-OP IMPRESSIONS  - Aorta: The aorta appears unchanged from pre-bypass.  - Aortic Valve: The aortic valve appears unchanged from pre-bypass.  - Mitral Valve: The mitral valve appears unchanged from pre-bypass.  - Tricuspid Valve: The tricuspid valve appears unchanged from pre-bypass.  There  is moderate regurgitation.   PRE-OP FINDINGS  Left Ventricle: The left ventricle has normal systolic function, with an  ejection fraction of 60-65%. The cavity size was normal. There is normal  left ventricular wall thickness.   Right Ventricle: The right ventricle has normal systolic function. The  cavity was mildly enlarged. There is no increase in right ventricular wall  thickness.   Left Atrium: The left atrial appendage is well visualized and there is  evidence of thrombus present.   Right Atrium: Right atrial size was normal in size.    Interatrial Septum: No atrial level shunt detected by color flow Doppler.   Pericardium: There is no evidence of pericardial effusion.   Mitral Valve: The mitral valve is normal in structure. No thickening of  the mitral valve leaflet. Mitral valve regurgitation is trivial by color  flow Doppler.   Tricuspid Valve: The tricuspid valve was normal in structure. Tricuspid  valve regurgitation is mild-moderate by color flow Doppler. The jet is  directed centrally.   Aortic Valve: The aortic valve is tricuspid There is mild thickening of  the aortic valve Aortic valve regurgitation was not visualized by color  flow Doppler. There is no stenosis of the aortic valve. There is no  evidence of a vegetation on the aortic  valve.   Pulmonic Valve: The pulmonic valve was normal in structure, with normal.  Pulmonic valve regurgitation is trivial by color flow Doppler.    Aorta: The aortic root and proximal ascending aorta were of normal  diameter with a well defined aortic root and sinotubular junction without  dilataion or effacement.  There was moderate intimal thickening but no  protruding atheromatous disease present.   The decending thoracic aorta had severe atherosclerotic disease present  with grade 4 and 5 plaques protruding into the aortic lumen. The diameter  of the descending aorta was normal.   CATH: 01/26/2020 Conclusions: 1. Severe three-vessel coronary artery disease, as  outlined below. 2. Normal left ventricular systolic function and filling pressure.  Recommendations: 1. Admit for cardiac surgery consultation for CABG (case discussed with Dr. Kipp Brood). 2. Medical therapy of chest pain, which is likely a combination of angina and non-cardiac chest pain. 3. Aggressive secondary prevention. 4. Obtain transthoracic echocardiogram.   Recent Labs: 12/27/2019: BNP 33.4; TSH 1.870 02/01/2020: ALT 31 02/02/2020: Magnesium 2.6 02/04/2020: BUN 12; Creatinine, Ser 0.74; Hemoglobin 9.1; Platelets 149; Potassium 4.7; Sodium 139  CBC    Component Value Date/Time   WBC 10.7 (H) 02/04/2020 0351   RBC 3.71 (L) 02/04/2020 0351   HGB 9.1 (L) 02/04/2020 0351   HGB 12.5 12/27/2019 1146   HCT 30.2 (L) 02/04/2020 0351   HCT 38.5 12/27/2019 1146   PLT 149 (L) 02/04/2020 0351   PLT 321 12/27/2019 1146   MCV 81.4 02/04/2020 0351   MCV 72 (L) 12/27/2019 1146   MCH 24.5 (L) 02/04/2020 0351   MCHC 30.1 02/04/2020 0351   RDW 17.4 (H) 02/04/2020 0351   RDW 14.7 12/27/2019 1146   LYMPHSABS 3.0 01/26/2020 1411   MONOABS 0.7 01/26/2020 1411   EOSABS 0.3 01/26/2020 1411   BASOSABS 0.1 01/26/2020 1411   CMP Latest Ref Rng & Units 02/04/2020 02/03/2020 02/02/2020  Glucose 70 - 99 mg/dL 134(H) 148(H) 170(H)  BUN 8 - 23 mg/dL 12 13 12   Creatinine 0.44 - 1.00 mg/dL 0.74 0.94 0.87  Sodium 135 - 145 mmol/L 139 135 137  Potassium 3.5 - 5.1 mmol/L 4.7 4.4 4.1  Chloride 98 - 111 mmol/L 108 106 107  CO2 22 - 32 mmol/L 23 21(L) 21(L)  Calcium 8.9 - 10.3 mg/dL 9.9 9.6 9.6  Total Protein 6.5 - 8.1 g/dL - - -  Total Bilirubin 0.3 -  1.2 mg/dL - - -  Alkaline Phos 38 - 126 U/L - - -  AST 15 - 41 U/L - - -  ALT 0 - 44 U/L - - -     Lipid Panel Lab Results  Component Value Date   CHOL 186 01/27/2020   HDL 33 (L) 01/27/2020   LDLCALC 78 01/27/2020   TRIG 374 (H) 01/27/2020   CHOLHDL 5.6 01/27/2020      Wt Readings from Last 3 Encounters:  02/23/20 105 lb (47.6 kg)  02/16/20 114 lb (51.7 kg)  02/04/20 125 lb (56.7 kg)     Other studies Reviewed: Additional studies/ records that were reviewed today include: Office notes, hospital records and testing.  ASSESSMENT AND PLAN:  1.  CAD s/p CABG: - She is recovering well after the surgery - ok to start cardiac rehab - continue ASA 325 mg, BB  2. HLD, goal LDL < 70 - does not tolerate statins - her PCP had her on a medication, not sure of the name. - advised her that is fine as long as LDL < 70.  3. HTN - Continue Nevibolol at 25 mg qd  4. Foot pain and L arm pain, L breast pain - no obvious arm injury or trauma to the breast, has good ROM - foot pain may be neuropathy - continue current home meds and f/u with PCP  5. Fatigue/DOE - may be partly deconditioning, partly related to anemia - no signs/sx CHF on exam, no chest pain - w/ rales L base, ck CXR - increase activity, PCP to manage anemia   6. Anemia - she has hx iron deficiency, has required iron transfusions in the past - she had a blood transfusion in  the hospital - Hgb 9.1 at d/c, this may contribute to her fatigue - this is followed by her PCP - she has iron pills at home, requested she start them   Current medicines are reviewed at length with the patient today.  The patient does not have concerns regarding medicines.  The following changes have been made:  Start iron supplement, either rx or OTC  Labs/ tests ordered today include:  No orders of the defined types were placed in this encounter.    Disposition:   FU with Minus Breeding, MD  Signed, Rosaria Ferries, PA-C    02/23/2020 11:33 AM    [place] Phone: [phone]; Fax: [phone]

## 2020-02-23 NOTE — Patient Instructions (Addendum)
Medication Instructions:  Begin an Iron Suppelment *If you need a refill on your cardiac medications before your next appointment, please call your pharmacy*   Lab Work: None Ordered If you have labs (blood work) drawn today and your tests are completely normal, you will receive your results only by: Marland Kitchen MyChart Message (if you have MyChart) OR . A paper copy in the mail If you have any lab test that is abnormal or we need to change your treatment, we will call you to review the results.   Testing/Procedures: Chest X-Ray   Follow-Up: At [facility], you and your health needs are our priority.  As part of our continuing mission to provide you with exceptional heart care, we have created designated Provider Care Teams.  These Care Teams include your primary Cardiologist (physician) and Advanced Practice Providers (APPs -  Physician Assistants and Nurse Practitioners) who all work together to provide you with the care you need, when you need it.  We recommend signing up for the patient portal called "MyChart".  Sign up information is provided on this After Visit Summary.  MyChart is used to connect with patients for Virtual Visits (Telemedicine).  Patients are able to view lab/test results, encounter notes, upcoming appointments, etc.  Non-urgent messages can be sent to your provider as well.   To learn more about what you can do with MyChart, go to [email or web address].    Your next appointment:   3 month(s)  The format for your next appointment:   In Person  Provider:   Minus Breeding, MD

## 2020-02-24 ENCOUNTER — Encounter: Payer: Self-pay | Admitting: Physician Assistant

## 2020-03-11 DIAGNOSIS — I739 Peripheral vascular disease, unspecified: Secondary | ICD-10-CM | POA: Diagnosis not present

## 2020-03-11 DIAGNOSIS — D509 Iron deficiency anemia, unspecified: Secondary | ICD-10-CM | POA: Diagnosis not present

## 2020-03-11 DIAGNOSIS — E1122 Type 2 diabetes mellitus with diabetic chronic kidney disease: Secondary | ICD-10-CM | POA: Diagnosis not present

## 2020-03-11 DIAGNOSIS — M545 Low back pain: Secondary | ICD-10-CM | POA: Diagnosis not present

## 2020-03-11 DIAGNOSIS — N1831 Chronic kidney disease, stage 3a: Secondary | ICD-10-CM | POA: Diagnosis not present

## 2020-03-11 DIAGNOSIS — E782 Mixed hyperlipidemia: Secondary | ICD-10-CM | POA: Diagnosis not present

## 2020-03-11 DIAGNOSIS — K219 Gastro-esophageal reflux disease without esophagitis: Secondary | ICD-10-CM | POA: Diagnosis not present

## 2020-03-11 DIAGNOSIS — G629 Polyneuropathy, unspecified: Secondary | ICD-10-CM | POA: Diagnosis not present

## 2020-03-11 DIAGNOSIS — I129 Hypertensive chronic kidney disease with stage 1 through stage 4 chronic kidney disease, or unspecified chronic kidney disease: Secondary | ICD-10-CM | POA: Diagnosis not present

## 2020-03-12 ENCOUNTER — Telehealth: Payer: Self-pay

## 2020-03-12 NOTE — Telephone Encounter (Signed)
Pt calls office to request refill of Oxycodone, s/p CABG by Dr. Kipp Brood on 02/01/20. She denies acute pain and sob. States she still has "soreness" in her sternum and also some "shooting nerve pain" to L breast area. Consulted Dr. Kipp Brood; no refill of Oxycodone ordered. States pt can take extra-strength Tylenol as directed for pain. Informed pt of this and verified that she is still taking Lyrica as ordered by Dr. Kipp Brood on 02/16/20. Pt is scheduled for f/u with Dr. Kipp Brood on 03/22/20.

## 2020-03-21 ENCOUNTER — Other Ambulatory Visit: Payer: Self-pay | Admitting: Thoracic Surgery (Cardiothoracic Vascular Surgery)

## 2020-03-21 DIAGNOSIS — C4370 Malignant melanoma of unspecified lower limb, including hip: Secondary | ICD-10-CM | POA: Diagnosis not present

## 2020-03-21 DIAGNOSIS — M8589 Other specified disorders of bone density and structure, multiple sites: Secondary | ICD-10-CM | POA: Diagnosis not present

## 2020-03-21 DIAGNOSIS — N6012 Diffuse cystic mastopathy of left breast: Secondary | ICD-10-CM | POA: Diagnosis not present

## 2020-03-21 DIAGNOSIS — Z951 Presence of aortocoronary bypass graft: Secondary | ICD-10-CM

## 2020-03-21 DIAGNOSIS — N6011 Diffuse cystic mastopathy of right breast: Secondary | ICD-10-CM | POA: Diagnosis not present

## 2020-03-21 DIAGNOSIS — D0590 Unspecified type of carcinoma in situ of unspecified breast: Secondary | ICD-10-CM | POA: Diagnosis not present

## 2020-03-21 DIAGNOSIS — Z86000 Personal history of in-situ neoplasm of breast: Secondary | ICD-10-CM | POA: Diagnosis not present

## 2020-03-21 DIAGNOSIS — D509 Iron deficiency anemia, unspecified: Secondary | ICD-10-CM | POA: Diagnosis not present

## 2020-03-21 DIAGNOSIS — M899 Disorder of bone, unspecified: Secondary | ICD-10-CM | POA: Diagnosis not present

## 2020-03-22 ENCOUNTER — Ambulatory Visit
Admission: RE | Admit: 2020-03-22 | Discharge: 2020-03-22 | Disposition: A | Discharge status: Home / Self Care | Payer: Medicare Other | Source: Ambulatory Visit | Attending: Thoracic Surgery (Cardiothoracic Vascular Surgery) | Admitting: Thoracic Surgery (Cardiothoracic Vascular Surgery)

## 2020-03-22 ENCOUNTER — Encounter: Payer: Self-pay | Admitting: Thoracic Surgery (Cardiothoracic Vascular Surgery)

## 2020-03-22 ENCOUNTER — Other Ambulatory Visit: Payer: Self-pay

## 2020-03-22 ENCOUNTER — Ambulatory Visit (INDEPENDENT_AMBULATORY_CARE_PROVIDER_SITE_OTHER): Payer: Self-pay | Admitting: Thoracic Surgery (Cardiothoracic Vascular Surgery)

## 2020-03-22 VITALS — BP 147/80 | HR 79 | Temp 97.3°F | Resp 18 | Ht 61.0 in | Wt 110.8 lb

## 2020-03-22 DIAGNOSIS — Z951 Presence of aortocoronary bypass graft: Secondary | ICD-10-CM

## 2020-03-22 DIAGNOSIS — R0600 Dyspnea, unspecified: Secondary | ICD-10-CM | POA: Diagnosis not present

## 2020-03-22 NOTE — Progress Notes (Signed)
      [address]       [place] [address]             [phone]        Karilynn Almond [place]ley Record #[id number] Date of Birth: 05-Apr-1946  Referring: Minus Breeding, MD Primary Care: Maury Dus, MD Primary Cardiologist:James Hochrein, MD  Reason for visit:   follow-up  History of Present Illness:     Mrs. Pund comes in for second follow-up visit.  Overall she doing well, she does have some shortness of breath with ambulation, and complains of bilateral some chest wall tenderness.  She has been quite active and walks a fair amount every day up a hill and denies any anginal symptoms.  Physical Exam: BP (!) 147/80 (BP Location: Left Arm, Patient Position: Sitting, Cuff Size: Small)   Pulse 79   Temp (!) 97.3 F (36.3 C)   Resp 18   Ht 5\' 1"  (1.549 m)   Wt 110 lb 12.8 oz (50.3 kg)   SpO2 95% Comment: RA  BMI 20.94 kg/m   Alert NAD Incision clean.  Sternum stable Abdomen soft, ND No peripheral edema   Diagnostic Studies & Laboratory data: CXR: Clear     Assessment / Plan:   74 year old female status post CABG.  Currently doing well. Cleared for cardiac rehab Follow-up as needed   Lajuana Matte 03/22/2020 1:23 PM

## 2020-03-28 DIAGNOSIS — D509 Iron deficiency anemia, unspecified: Secondary | ICD-10-CM | POA: Diagnosis not present

## 2020-04-02 DIAGNOSIS — N1831 Chronic kidney disease, stage 3a: Secondary | ICD-10-CM | POA: Diagnosis not present

## 2020-04-09 DIAGNOSIS — I6529 Occlusion and stenosis of unspecified carotid artery: Secondary | ICD-10-CM | POA: Diagnosis not present

## 2020-04-09 DIAGNOSIS — Z951 Presence of aortocoronary bypass graft: Secondary | ICD-10-CM | POA: Diagnosis not present

## 2020-04-09 DIAGNOSIS — E1165 Type 2 diabetes mellitus with hyperglycemia: Secondary | ICD-10-CM | POA: Diagnosis not present

## 2020-04-09 DIAGNOSIS — I10 Essential (primary) hypertension: Secondary | ICD-10-CM | POA: Diagnosis not present

## 2020-04-10 DIAGNOSIS — Z951 Presence of aortocoronary bypass graft: Secondary | ICD-10-CM | POA: Diagnosis not present

## 2020-04-10 DIAGNOSIS — E1165 Type 2 diabetes mellitus with hyperglycemia: Secondary | ICD-10-CM | POA: Diagnosis not present

## 2020-04-10 DIAGNOSIS — I10 Essential (primary) hypertension: Secondary | ICD-10-CM | POA: Diagnosis not present

## 2020-04-10 DIAGNOSIS — I6529 Occlusion and stenosis of unspecified carotid artery: Secondary | ICD-10-CM | POA: Diagnosis not present

## 2020-04-12 DIAGNOSIS — E1165 Type 2 diabetes mellitus with hyperglycemia: Secondary | ICD-10-CM | POA: Diagnosis not present

## 2020-04-12 DIAGNOSIS — I6529 Occlusion and stenosis of unspecified carotid artery: Secondary | ICD-10-CM | POA: Diagnosis not present

## 2020-04-12 DIAGNOSIS — I10 Essential (primary) hypertension: Secondary | ICD-10-CM | POA: Diagnosis not present

## 2020-04-12 DIAGNOSIS — Z951 Presence of aortocoronary bypass graft: Secondary | ICD-10-CM | POA: Diagnosis not present

## 2020-04-15 DIAGNOSIS — I6529 Occlusion and stenosis of unspecified carotid artery: Secondary | ICD-10-CM | POA: Diagnosis not present

## 2020-04-15 DIAGNOSIS — E1165 Type 2 diabetes mellitus with hyperglycemia: Secondary | ICD-10-CM | POA: Diagnosis not present

## 2020-04-15 DIAGNOSIS — I10 Essential (primary) hypertension: Secondary | ICD-10-CM | POA: Diagnosis not present

## 2020-04-15 DIAGNOSIS — Z951 Presence of aortocoronary bypass graft: Secondary | ICD-10-CM | POA: Diagnosis not present

## 2020-04-17 DIAGNOSIS — I6529 Occlusion and stenosis of unspecified carotid artery: Secondary | ICD-10-CM | POA: Diagnosis not present

## 2020-04-17 DIAGNOSIS — E1165 Type 2 diabetes mellitus with hyperglycemia: Secondary | ICD-10-CM | POA: Diagnosis not present

## 2020-04-17 DIAGNOSIS — I10 Essential (primary) hypertension: Secondary | ICD-10-CM | POA: Diagnosis not present

## 2020-04-17 DIAGNOSIS — Z951 Presence of aortocoronary bypass graft: Secondary | ICD-10-CM | POA: Diagnosis not present

## 2020-04-19 DIAGNOSIS — Z955 Presence of coronary angioplasty implant and graft: Secondary | ICD-10-CM | POA: Diagnosis not present

## 2020-04-19 DIAGNOSIS — Z951 Presence of aortocoronary bypass graft: Secondary | ICD-10-CM | POA: Diagnosis not present

## 2020-04-22 DIAGNOSIS — Z951 Presence of aortocoronary bypass graft: Secondary | ICD-10-CM | POA: Diagnosis not present

## 2020-04-22 DIAGNOSIS — Z955 Presence of coronary angioplasty implant and graft: Secondary | ICD-10-CM | POA: Diagnosis not present

## 2020-04-24 DIAGNOSIS — Z951 Presence of aortocoronary bypass graft: Secondary | ICD-10-CM | POA: Diagnosis not present

## 2020-04-24 DIAGNOSIS — Z955 Presence of coronary angioplasty implant and graft: Secondary | ICD-10-CM | POA: Diagnosis not present

## 2020-04-26 DIAGNOSIS — Z951 Presence of aortocoronary bypass graft: Secondary | ICD-10-CM | POA: Diagnosis not present

## 2020-04-26 DIAGNOSIS — Z955 Presence of coronary angioplasty implant and graft: Secondary | ICD-10-CM | POA: Diagnosis not present

## 2020-04-29 DIAGNOSIS — Z951 Presence of aortocoronary bypass graft: Secondary | ICD-10-CM | POA: Diagnosis not present

## 2020-04-29 DIAGNOSIS — Z955 Presence of coronary angioplasty implant and graft: Secondary | ICD-10-CM | POA: Diagnosis not present

## 2020-04-30 DIAGNOSIS — H00013 Hordeolum externum right eye, unspecified eyelid: Secondary | ICD-10-CM | POA: Diagnosis not present

## 2020-05-01 DIAGNOSIS — Z951 Presence of aortocoronary bypass graft: Secondary | ICD-10-CM | POA: Diagnosis not present

## 2020-05-01 DIAGNOSIS — Z955 Presence of coronary angioplasty implant and graft: Secondary | ICD-10-CM | POA: Diagnosis not present

## 2020-05-03 DIAGNOSIS — Z955 Presence of coronary angioplasty implant and graft: Secondary | ICD-10-CM | POA: Diagnosis not present

## 2020-05-03 DIAGNOSIS — Z951 Presence of aortocoronary bypass graft: Secondary | ICD-10-CM | POA: Diagnosis not present

## 2020-05-06 DIAGNOSIS — Z955 Presence of coronary angioplasty implant and graft: Secondary | ICD-10-CM | POA: Diagnosis not present

## 2020-05-06 DIAGNOSIS — Z951 Presence of aortocoronary bypass graft: Secondary | ICD-10-CM | POA: Diagnosis not present

## 2020-05-08 DIAGNOSIS — Z951 Presence of aortocoronary bypass graft: Secondary | ICD-10-CM | POA: Diagnosis not present

## 2020-05-08 DIAGNOSIS — Z955 Presence of coronary angioplasty implant and graft: Secondary | ICD-10-CM | POA: Diagnosis not present

## 2020-05-10 DIAGNOSIS — Z951 Presence of aortocoronary bypass graft: Secondary | ICD-10-CM | POA: Diagnosis not present

## 2020-05-10 DIAGNOSIS — Z955 Presence of coronary angioplasty implant and graft: Secondary | ICD-10-CM | POA: Diagnosis not present

## 2020-05-13 DIAGNOSIS — Z955 Presence of coronary angioplasty implant and graft: Secondary | ICD-10-CM | POA: Diagnosis not present

## 2020-05-13 DIAGNOSIS — Z951 Presence of aortocoronary bypass graft: Secondary | ICD-10-CM | POA: Diagnosis not present

## 2020-05-15 DIAGNOSIS — Z951 Presence of aortocoronary bypass graft: Secondary | ICD-10-CM | POA: Diagnosis not present

## 2020-05-15 DIAGNOSIS — Z955 Presence of coronary angioplasty implant and graft: Secondary | ICD-10-CM | POA: Diagnosis not present

## 2020-05-17 ENCOUNTER — Other Ambulatory Visit: Payer: Self-pay | Admitting: Hematology and Oncology

## 2020-05-17 DIAGNOSIS — Z951 Presence of aortocoronary bypass graft: Secondary | ICD-10-CM | POA: Diagnosis not present

## 2020-05-17 DIAGNOSIS — D0512 Intraductal carcinoma in situ of left breast: Secondary | ICD-10-CM

## 2020-05-17 DIAGNOSIS — Z955 Presence of coronary angioplasty implant and graft: Secondary | ICD-10-CM | POA: Diagnosis not present

## 2020-05-20 ENCOUNTER — Ambulatory Visit: Payer: Medicare Other | Admitting: Cardiology

## 2020-05-20 DIAGNOSIS — Z955 Presence of coronary angioplasty implant and graft: Secondary | ICD-10-CM | POA: Diagnosis not present

## 2020-05-20 DIAGNOSIS — Z951 Presence of aortocoronary bypass graft: Secondary | ICD-10-CM | POA: Diagnosis not present

## 2020-05-20 NOTE — Progress Notes (Signed)
Cardiology Office Note   Date:  05/21/2020   ID:  Faith Gaines, DOB 1946-04-14, MRN [id number]  PCP:  Maury Dus, MD  Cardiologist:   Minus Breeding, MD  Chief Complaint  Patient presents with  . Coronary Artery Disease      History of Present Illness: Faith Gaines is a 74 y.o. female who is referred for evaluation of CAD .   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   Unfortunately she continues to have incisional discomfort.  This is in the mid to lower sternal incision site.  She also has breast tenderness.  She is not having the right axillary pain that she had prior to CABG.  The patient denies any new symptoms such as neck or arm discomfort. There has been no new shortness of breath, PND or orthopnea. There have been no reported palpitations, presyncope or syncope.    Past Medical History:  Diagnosis Date  . Anxiety    no meds  . Breast cancer ([place]) 2015   left breast   . CKD (chronic kidney disease), stage III (HCC)    no meds, blood draws q 2mos by Dr. Alyson Ingles in [place]  . Depression    no meds  . Diabetes mellitus without complication ([place])    type 2  . GERD (gastroesophageal reflux disease)    occ  . Hyperlipidemia    diet controlled, no meds  . Hypertension   . Iron deficiency anemia   . Osteopenia   . Peripheral vascular disease ([place])    rt carotid stenosis  . Pneumonia 12/2011, 05/2012   hx x2 in 2013  . PONV (postoperative nausea and vomiting)   . Trigger finger    resolved with cortisone injections    Past Surgical History:  Procedure Laterality Date  . ANTERIOR AND POSTERIOR REPAIR N/A 10/22/2014   Procedure: CYSTO, REPAIR CYSTOCELE/RECTOCELE;  Surgeon: Bjorn Loser, MD;  Location: [facility];  Service: Urology;  Laterality: N/A;  . APPENDECTOMY    . BLADDER SURGERY  05/1981   bladder tack  . BREAST LUMPECTOMY Left 2015  . CHOLECYSTECTOMY  1995  . COLONOSCOPY    . CORONARY ARTERY  BYPASS GRAFT N/A 02/01/2020   Procedure: CORONARY ARTERY BYPASS GRAFTING (CABG) using LIMA to LAD; Endovein Harvested Greater Saphenous Vein: SVG to Diag1; SVG to OM1.;  Surgeon: Lajuana Matte, MD;  Location: [facility];  Service: Open Heart Surgery;  Laterality: N/A;  . ENDARTERECTOMY Right 06/29/2013   Procedure: ENDARTERECTOMY CAROTID-RIGHT, WITH DACRON PATCH ANGIOPLASTY;  Surgeon: Mal Misty, MD;  Location: [facility];  Service: Vascular;  Laterality: Right;  . ENDOVEIN HARVEST OF GREATER SAPHENOUS VEIN Left 02/01/2020   Procedure: ENDOVEIN HARVEST OF GREATER SAPHENOUS VEIN;  Surgeon: Lajuana Matte, MD;  Location: [facility];  Service: Open Heart Surgery;  Laterality: Left;  . LAPAROSCOPIC ASSISTED VAGINAL HYSTERECTOMY N/A 10/22/2014   Procedure: LAPAROSCOPIC ASSISTED VAGINAL HYSTERECTOMY;  Surgeon: Marylynn Pearson, MD;  Location: [facility];  Service: Gynecology;  Laterality: N/A;  Pam from Dr. Mikle Bosworth office will call and add additional surgery for this patient in our block time.  Marland Kitchen LEFT HEART CATH AND CORONARY ANGIOGRAPHY N/A 01/26/2020   Procedure: LEFT HEART CATH AND CORONARY ANGIOGRAPHY;  Surgeon: [name], MD;  Location: [facility];  Service: Cardiovascular;  Laterality: N/A;  . MELANOMA EXCISION  12/1980   left leg,  . PARTIAL MASTECTOMY WITH NEEDLE LOCALIZATION Left  08/22/2013   Procedure: PARTIAL MASTECTOMY WITH NEEDLE LOCALIZATION;  Surgeon: Stark Klein, MD;  Location: [place];  Service: General;  Laterality: Left;  . SALPINGOOPHORECTOMY Bilateral 10/22/2014   Procedure: SALPINGO OOPHORECTOMY;  Surgeon: Marylynn Pearson, MD;  Location: [facility];  Service: Gynecology;  Laterality: Bilateral;  . 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 TEE WITHOUT CARDIOVERSION N/A 02/01/2020   Procedure: TRANSESOPHAGEAL ECHOCARDIOGRAM (TEE);  Surgeon: Lajuana Matte, MD;  Location: [place] [facility];  Service: Open Heart Surgery;  Laterality: N/A;  . TUBAL LIGATION    . VAGINAL PROLAPSE  REPAIR N/A 10/22/2014   Procedure: VAULT PROLAPSE WITH GRAFT;  Surgeon: Bjorn Loser, MD;  Location: [facility];  Service: Urology;  Laterality: N/A;  . WISDOM TOOTH EXTRACTION       Current Outpatient Medications  Medication Sig Dispense Refill  . aspirin EC 325 MG EC tablet Take 1 tablet (325 mg total) by mouth daily.    . Canagliflozin (INVOKANA) 300 MG TABS Take 300 mg by mouth daily.     . nebivolol (BYSTOLIC) 5 MG tablet Take 5 mg by mouth daily.    . Nebivolol HCl (BYSTOLIC) 20 MG TABS Take 1 tablet by mouth daily.    . Omega-3 Fatty Acids (OMEGA-3 2100) 1050 MG CAPS Take 1 capsule by mouth daily.     . pantoprazole (PROTONIX) 40 MG tablet Take 40 mg by mouth daily.    . Pitavastatin Calcium (LIVALO) 2 MG TABS Take 2 mg by mouth 2 (two) times a week.    . pregabalin (LYRICA) 25 MG capsule Take 1 capsule (25 mg total) by mouth 2 (two) times daily. 60 capsule 0  . Saxagliptin-Metformin (KOMBIGLYZE XR) 2.11-998 MG TB24 Take 1 tablet by mouth 2 (two) times daily. Reported on 08/21/2015    . VITAMIN D PO Take 1,000 Units by mouth daily.    . vitamin E 1000 UNIT capsule Take 1,000 Units by mouth daily.     No current facility-administered medications for this visit.    Allergies:   Codeine, Ace inhibitors, Clarithromycin, Crestor [rosuvastatin], Lipitor [atorvastatin], and Avandamet [rosiglitazone-metformin]    ROS:  Please see the history of present illness.   Otherwise, review of systems are positive for back pain.   All other systems are reviewed and negative.    PHYSICAL EXAM: VS:  BP (!) 148/72   Pulse 74   Ht 5\' 1"  (1.549 m)   Wt 116 lb 9.6 oz (52.9 kg)   SpO2 93%   BMI 22.03 kg/m  , BMI Body mass index is 22.03 kg/m. GENERAL:  Well appearing NECK:  No jugular venous distention, waveform within normal limits, carotid upstroke brisk and symmetric, no bruits, no thyromegaly LUNGS:  Clear to auscultation bilaterally CHEST:  Well healed sternotomy scar.  There is point  tenderness to pressure in the mid sternal area but no evidence of sternal mobility, inflammation or fluctuance.   HEART:  PMI not displaced or sustained,S1 and S2 within normal limits, no S3, no S4, no clicks, no rubs, no murmurs ABD:  Flat, positive bowel sounds normal in frequency in pitch, no bruits, no rebound, no guarding, no midline pulsatile mass, no hepatomegaly, no splenomegaly EXT:  2 plus pulses throughout, no edema, no cyanosis no clubbing   EKG:  EKG is not ordered today.    Recent Labs: 12/27/2019: BNP 33.4; TSH 1.870 02/01/2020: ALT 31 02/02/2020: Magnesium 2.6 02/04/2020: BUN 12; Creatinine, Ser 0.74; Hemoglobin 9.1; Platelets 149; Potassium 4.7;  Sodium 139    Lipid Panel    Component Value Date/Time   CHOL 186 01/27/2020 0149   TRIG 374 (H) 01/27/2020 0149   HDL 33 (L) 01/27/2020 0149   CHOLHDL 5.6 01/27/2020 0149   VLDL 75 (H) 01/27/2020 0149   LDLCALC 78 01/27/2020 0149      Wt Readings from Last 3 Encounters:  05/21/20 116 lb 9.6 oz (52.9 kg)  03/22/20 110 lb 12.8 oz (50.3 kg)  02/23/20 105 lb (47.6 kg)      Other studies Reviewed: Additional studies/ records that were reviewed today include: None. Review of the above records demonstrates:  NA   ASSESSMENT AND PLAN:   CAD/CABG:     She has incisional pain but no evidence of non healing or abscess.  I have suggested a short course of motrin to see if she can reduce this discomfort.  I do note that she has had follow up CXR without abnormalities.  I have also encouraged her to follow up with breast imaging as she does have breast pain and a past history of breast cancer.   PVD:  Her PVD is followed by her PCP.  I will defer to Maury Dus, MD  DM: A1c was 6.5 which is down from  7.6.    DYSLIPIDEMIA:   LDL was 78.  She has not tolerated much in the way of statins but she did have her Livalo recently increased to twice weekly and seems to be tolerating this.    Current medicines are reviewed at length  with the patient today.  The patient does not have concerns regarding medicines.  The following changes have been made:  None  Labs/ tests ordered today include: None  No orders of the defined types were placed in this encounter.    Disposition:   FU with APP in 4 months.      Signed, Minus Breeding, MD  05/21/2020 4:08 PM    [facility]

## 2020-05-21 ENCOUNTER — Encounter: Payer: Self-pay | Admitting: Cardiology

## 2020-05-21 ENCOUNTER — Other Ambulatory Visit: Payer: Self-pay

## 2020-05-21 ENCOUNTER — Ambulatory Visit (INDEPENDENT_AMBULATORY_CARE_PROVIDER_SITE_OTHER): Payer: Medicare Other | Admitting: Cardiology

## 2020-05-21 VITALS — BP 148/72 | HR 74 | Ht 61.0 in | Wt 116.6 lb

## 2020-05-21 DIAGNOSIS — I200 Unstable angina: Secondary | ICD-10-CM | POA: Diagnosis not present

## 2020-05-21 DIAGNOSIS — R0602 Shortness of breath: Secondary | ICD-10-CM

## 2020-05-21 DIAGNOSIS — E1151 Type 2 diabetes mellitus with diabetic peripheral angiopathy without gangrene: Secondary | ICD-10-CM

## 2020-05-21 DIAGNOSIS — I2510 Atherosclerotic heart disease of native coronary artery without angina pectoris: Secondary | ICD-10-CM | POA: Diagnosis not present

## 2020-05-21 DIAGNOSIS — E785 Hyperlipidemia, unspecified: Secondary | ICD-10-CM | POA: Diagnosis not present

## 2020-05-21 DIAGNOSIS — I739 Peripheral vascular disease, unspecified: Secondary | ICD-10-CM | POA: Diagnosis not present

## 2020-05-21 NOTE — Patient Instructions (Signed)
Medication Instructions:  Take 400 mg to 600 mg of Motrin 3 times a day for 5-7 days *If you need a refill on your cardiac medications before your next appointment, please call your pharmacy*   Lab Work: None ordered If you have labs (blood work) drawn today and your tests are completely normal, you will receive your results only by: Marland Kitchen MyChart Message (if you have MyChart) OR . A paper copy in the mail If you have any lab test that is abnormal or we need to change your treatment, we will call you to review the results.   Testing/Procedures: None ordered   Follow-Up: At [facility], you and your health needs are our priority.  As part of our continuing mission to provide you with exceptional heart care, we have created designated Provider Care Teams.  These Care Teams include your primary Cardiologist (physician) and Advanced Practice Providers (APPs -  Physician Assistants and Nurse Practitioners) who all work together to provide you with the care you need, when you need it.  We recommend signing up for the patient portal called "MyChart".  Sign up information is provided on this After Visit Summary.  MyChart is used to connect with patients for Virtual Visits (Telemedicine).  Patients are able to view lab/test results, encounter notes, upcoming appointments, etc.  Non-urgent messages can be sent to your provider as well.   To learn more about what you can do with MyChart, go to [email or web address].    Your next appointment:   4 month(s)  The format for your next appointment:   In Person  Provider:   You will see one of the following Advanced Practice Providers on your designated Care Team:    Rosaria Ferries, PA-C  Jory Sims, DNP, ANP   Other Instructions None

## 2020-05-22 DIAGNOSIS — Z955 Presence of coronary angioplasty implant and graft: Secondary | ICD-10-CM | POA: Diagnosis not present

## 2020-05-22 DIAGNOSIS — Z951 Presence of aortocoronary bypass graft: Secondary | ICD-10-CM | POA: Diagnosis not present

## 2020-05-24 DIAGNOSIS — Z951 Presence of aortocoronary bypass graft: Secondary | ICD-10-CM | POA: Diagnosis not present

## 2020-05-24 DIAGNOSIS — Z955 Presence of coronary angioplasty implant and graft: Secondary | ICD-10-CM | POA: Diagnosis not present

## 2020-05-27 DIAGNOSIS — Z955 Presence of coronary angioplasty implant and graft: Secondary | ICD-10-CM | POA: Diagnosis not present

## 2020-05-27 DIAGNOSIS — Z951 Presence of aortocoronary bypass graft: Secondary | ICD-10-CM | POA: Diagnosis not present

## 2020-05-28 NOTE — Progress Notes (Signed)
[facility]  [address] [place],  [place]  [address] [phone]  Clinic Day:  05/29/2020  Referring physician: Maury Dus, MD   This document serves as a record of services personally performed by Faith Poisson, MD. It was created on their behalf by Curry,Lauren E, a trained medical scribe. The creation of this record is based on the scribe's personal observations and the provider's statements to them.   CHIEF COMPLAINT:  CC: History of stage 0 left breast cancer  Current Treatment:  Surveillance   HISTORY OF PRESENT ILLNESS:  Faith Gaines is a 74 y.o. female with a history of stage 0 left breast cancer diagnosed in November of 2014 and treated with a lumpectomy in February of 2015. 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  This measured 0.1 cm for a Tis N0 N0.  She was offered chemoprevention but declined because of many other comorbidities.  She also has a history of a melanoma of the left leg resected at age 62.  She does have osteopenia and her last bone density scan was done in January of 2015. She gets these done through her gynecologist in [place].  In April of 2016, she had a total abdominal hysterectomy and bilateral salpingo-oophorectomy.  She did have a iron deficiency anemia in the past, but this corrected.  She tells me that she met with her primary care physician in December of 2016, and she was anemic again with a hemoglobin down to 11.  She was found to have iron deficiency.  She did complain of occasional soreness of her tongue, dizziness, and craving for ice.  She was placed on slow release iron after she was not able to tolerate ferrous sulfate.  She was fatigued and weak with dyspnea on exertion, and had daily headaches as well.  She had severe bleeding from the rectum which she attributes to hemorrhoids. Studies still showed evidence of iron  deficiency and so she was given 2 injections of Feraheme at the end of December  2016, 1 week apart.  She has had endoscopy and colonoscopy through Dr. Collene Mares in [place] several years ago, so she was referred back, and had these repeated in January of 2017.  EGD revealed diffuse moderate gastritis and colonoscopy revealed hemorrhoids.  She was told to avoid all nonsteroidal anti-inflammatory medications.    She has been diagnosed with lichen planopilaris and is using a liquid steroid from the dermatologist in [place], and topical Rogaine.  They referred her to [facility], and they felt this was immune mediated and placed her on hydroxychloroquine 200 mg twice daily.  She feels this is helping some.  She had a biopsy in the past of her right upper arm by a dermatologist in [place], Dr. Renda Rolls, and was told this was "pre melanoma".  She had a bone density scan 2 years ago, which apparently was not as good as previous.  She was seen in September with symptoms of iron deficiency with severe fatigue, shortness of breath and weakness.  Iron studies from August 31st revealed an iron level of 26 with a TIBC of 430 for a percent saturation of 6%.  Transferrin was normal at 307.  However, her hemoglobin was 12.5 at that time, but with an MCV of 77.6.  She was given 2 doses of IV Feraheme.  Her chemistries were unremarkable except for a chronic elevated calcium of 11.4, and a potassium of  5.8.  She underwent triple bypass surgery in June 2021 and her hemoglobin was 9.0 at the last check with her cardiologist.  They took veins from her left leg and left chest.  Since her surgery, she has had pain within that left breast.     INTERVAL HISTORY:  Faith Gaines is here for routine follow up and states that she continues to have pain of her central sternal chest wall and left breast which she rates as a 5/10 today.  She was scheduled for mammogram, but feels she would be unable to bear it with her pain, so we  will postpone.  She was instructed to take Motrin 400 mg three times daily for her pain, but without improvement.  She is already on Lyrica 25 mg BID for her neuropathy, so we could consider increasing this.  Another alternative would be Elavil.  Her  appetite is good, and her weight is stable since her last visit.  She denies fever, chills or other signs of infection.  She denies nausea, vomiting, bowel issues, or abdominal pain.  She denies sore throat, cough, dyspnea, or chest pain.   REVIEW OF SYSTEMS:  Review of Systems  Musculoskeletal:       Occasional pain of the central chest which radiates through the left breast.  All other systems reviewed and are negative.    VITALS:  Blood pressure 137/60, pulse 86, temperature 98 F (36.7 C), temperature source Oral, resp. rate 18, height _0  (1.549 m), weight 116 lb 12.8 oz (53 kg), SpO2 95 %.  Wt Readings from Last 3 Encounters:  05/29/20 116 lb 12.8 oz (53 kg)  05/21/20 116 lb 9.6 oz (52.9 kg)  03/22/20 110 lb 12.8 oz (50.3 kg)    Body mass index is 22.07 kg/m.  Performance status (ECOG): 1 - Symptomatic but completely ambulatory  PHYSICAL EXAM:  Physical Exam Constitutional:      General: She is not in acute distress.    Appearance: Normal appearance. She is normal weight.  HENT:     Head: Normocephalic and atraumatic.  Eyes:     General: No scleral icterus.    Extraocular Movements: Extraocular movements intact.     Conjunctiva/sclera: Conjunctivae normal.     Pupils: Pupils are equal, round, and reactive to light.  Cardiovascular:     Rate and Rhythm: Normal rate and regular rhythm.     Pulses: Normal pulses.     Heart sounds: Normal heart sounds. No murmur heard.  No friction rub. No gallop.   Pulmonary:     Effort: Pulmonary effort is normal. No respiratory distress.     Breath sounds: Normal breath sounds.  Chest:     Breasts:        Right: Normal.        Left: Normal.     Comments: A few cysts scattered  throughout the right breast, which feel smooth and round. Abdominal:     General: Bowel sounds are normal. There is no distension.     Palpations: Abdomen is soft. There is no mass.     Tenderness: There is no abdominal tenderness.  Musculoskeletal:        General: Normal range of motion.     Cervical back: Normal range of motion and neck supple.     Right lower leg: No edema.     Left lower leg: No edema.  Lymphadenopathy:     Cervical: No cervical adenopathy.  Skin:    General: Skin is warm  and dry.  Neurological:     General: No focal deficit present.     Mental Status: She is alert and oriented to person, place, and time. Mental status is at baseline.  Psychiatric:        Mood and Affect: Mood normal.        Behavior: Behavior normal.        Thought Content: Thought content normal.        Judgment: Judgment normal.     LABS:   CBC Latest Ref Rng & Units 02/04/2020 02/03/2020 02/02/2020  WBC 4.0 - 10.5 K/uL 10.7(H) 11.0(H) 11.5(H)  Hemoglobin 12.0 - 15.0 g/dL 9.1(L) 9.1(L) 9.0(L)  Hematocrit 36 - 46 % 30.2(L) 30.4(L) 29.8(L)  Platelets 150 - 400 K/uL 149(L) 127(L) 141(L)   CMP Latest Ref Rng & Units 02/04/2020 02/03/2020 02/02/2020  Glucose 70 - 99 mg/dL 134(H) 148(H) 170(H)  BUN 8 - 23 mg/dL _0 Creatinine 0.44 - 1.00 mg/dL 0.74 0.94 0.87  Sodium 135 - 145 mmol/L 139 135 137  Potassium 3.5 - 5.1 mmol/L 4.7 4.4 4.1  Chloride 98 - 111 mmol/L 108 106 107  CO2 22 - 32 mmol/L 23 21(L) 21(L)  Calcium 8.9 - 10.3 mg/dL 9.9 9.6 9.6  Total Protein 6.5 - 8.1 g/dL - - -  Total Bilirubin 0.3 - 1.2 mg/dL - - -  Alkaline Phos 38 - 126 U/L - - -  AST 15 - 41 U/L - - -  ALT 0 - 44 U/L - - -    No results found for: TIBC, FERRITIN, IRONPCTSAT   STUDIES:  No results found.   Allergies:  Allergies  Allergen Reactions  . Codeine Nausea And Vomiting and Other (See Comments)    Headache and vomiting  . Ace Inhibitors     Drop in GFR  . Clarithromycin     Unable to sleep    . Crestor [Rosuvastatin]     Body aches  . Lipitor [Atorvastatin]     Body aches  . Avandamet [Rosiglitazone-Metformin] Rash and Other (See Comments)    Makes her hyper    Current Medications: Current Outpatient Medications  Medication Sig Dispense Refill  . aspirin EC 325 MG EC tablet Take 1 tablet (325 mg total) by mouth daily.    . Canagliflozin (INVOKANA) 300 MG TABS Take 300 mg by mouth daily.     . Multiple Vitamins-Minerals (PRESERVISION AREDS 2 PO) Take 1 tablet by mouth 2 (two) times daily.    . nebivolol (BYSTOLIC) 5 MG tablet Take 5 mg by mouth daily.    . Nebivolol HCl (BYSTOLIC) 20 MG TABS Take 1 tablet by mouth daily.    . Omega-3 Fatty Acids (FISH OIL) 1000 MG CAPS Take 1,000 mg by mouth 2 (two) times daily.    . Omega-3 Fatty Acids (OMEGA-3 2100) 1050 MG CAPS Take 1 capsule by mouth daily.     . pantoprazole (PROTONIX) 40 MG tablet Take 40 mg by mouth daily.    . Pitavastatin Calcium (LIVALO) 2 MG TABS Take 2 mg by mouth 2 (two) times a week.    . pregabalin (LYRICA) 25 MG capsule Take 1 capsule (25 mg total) by mouth 2 (two) times daily. 60 capsule 0  . Saxagliptin-Metformin (KOMBIGLYZE XR) 2.11-998 MG TB24 Take 1 tablet by mouth 2 (two) times daily. Reported on 08/21/2015    . VITAMIN D PO Take 1,000 Units by mouth daily.    . vitamin E 1000 UNIT capsule Take  1,000 Units by mouth daily.     No current facility-administered medications for this visit.     ASSESSMENT & PLAN:   Assessment:  1. Stage 0 breast cancer November 2014, treated with lumpectomy.  She has no evidence of disease.  2. Severe iron deficiency in the past, treated with intravenous supplement.  She now had iron deficiency again in September 2021, treated with IV iron.  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  4. Hypercalcemia, stable, with normal vitamin-D level.   5. History of malignant melanoma resected from her left leg at age 10. No evidence of disease.  6. History of "pre  melanoma" of her right upper arm resected.  The pathology revealed an inflamed junctional nevus with moderate atypia.  7. Osteopenia.  8. History of total abdominal hysterectomy and bilateral salpingo-oophorectomy.  9. Fibrocystic disease of her breasts, right greater than left, however, I don't think this is the etiology of her left breast pain.   With her current pain, we will postpone annual mammography for now.  10.  Triple bypass surgery in June 2021.  She completed cardiac rehab.  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  She has been placed on Motrin 400 mg TID, but without improvement.  I will therefore try her on Elavil 25 mg at bedtime.  Plan: As she continues to have chest wall and left breast pain, I will try her on Elavil 25 mg at bedtime.  If this is unsuccessful, we could increase this to 50 mg at bedtime, and if she still does not improve, we may try increasing the Lyrica.   I advise that she discontinue Motrin 400 mg TID.  She has had iron deficiency related to gastritis in the past.  We will hold off on mammography for now.   We will plan to see her back in 2 months for reexamiantion.  I will be glad to see her back if problems arise regarding her breast cancer or her iron deficiency.  She understands and agrees with this plan of care.   I provided 30 minutes of face-to-face time during this this encounter and > 50% was spent counseling as documented under my assessment and plan.    Derwood Kaplan, MD [facility] [address] [place] [address] Dept: [phone] Dept Fax: [phone]   I, Rita Ohara, am acting as scribe for Derwood Kaplan, MD  I have reviewed this report as typed by the medical scribe, and it is complete and accurate.

## 2020-05-29 ENCOUNTER — Encounter: Payer: Self-pay | Admitting: Oncology

## 2020-05-29 ENCOUNTER — Other Ambulatory Visit: Payer: Self-pay | Admitting: Oncology

## 2020-05-29 ENCOUNTER — Inpatient Hospital Stay: Payer: Medicare Other | Attending: Oncology

## 2020-05-29 ENCOUNTER — Inpatient Hospital Stay (INDEPENDENT_AMBULATORY_CARE_PROVIDER_SITE_OTHER): Payer: Medicare Other | Admitting: Oncology

## 2020-05-29 ENCOUNTER — Other Ambulatory Visit: Payer: Self-pay

## 2020-05-29 VITALS — BP 137/60 | HR 86 | Temp 98.0°F | Resp 18 | Ht 61.0 in | Wt 116.8 lb

## 2020-05-29 DIAGNOSIS — Z90710 Acquired absence of both cervix and uterus: Secondary | ICD-10-CM | POA: Diagnosis not present

## 2020-05-29 DIAGNOSIS — N6012 Diffuse cystic mastopathy of left breast: Secondary | ICD-10-CM | POA: Diagnosis not present

## 2020-05-29 DIAGNOSIS — D0512 Intraductal carcinoma in situ of left breast: Secondary | ICD-10-CM

## 2020-05-29 DIAGNOSIS — L639 Alopecia areata, unspecified: Secondary | ICD-10-CM | POA: Diagnosis not present

## 2020-05-29 DIAGNOSIS — C50412 Malignant neoplasm of upper-outer quadrant of left female breast: Secondary | ICD-10-CM

## 2020-05-29 DIAGNOSIS — Z853 Personal history of malignant neoplasm of breast: Secondary | ICD-10-CM | POA: Diagnosis not present

## 2020-05-29 DIAGNOSIS — N644 Mastodynia: Secondary | ICD-10-CM | POA: Insufficient documentation

## 2020-05-29 DIAGNOSIS — Z85820 Personal history of malignant melanoma of skin: Secondary | ICD-10-CM | POA: Insufficient documentation

## 2020-05-29 DIAGNOSIS — Z955 Presence of coronary angioplasty implant and graft: Secondary | ICD-10-CM | POA: Diagnosis not present

## 2020-05-29 DIAGNOSIS — M8580 Other specified disorders of bone density and structure, unspecified site: Secondary | ICD-10-CM | POA: Insufficient documentation

## 2020-05-29 DIAGNOSIS — Z170 Estrogen receptor positive status [ER+]: Secondary | ICD-10-CM | POA: Diagnosis not present

## 2020-05-29 DIAGNOSIS — Z951 Presence of aortocoronary bypass graft: Secondary | ICD-10-CM | POA: Diagnosis not present

## 2020-05-29 DIAGNOSIS — E611 Iron deficiency: Secondary | ICD-10-CM | POA: Diagnosis not present

## 2020-05-29 DIAGNOSIS — I200 Unstable angina: Secondary | ICD-10-CM | POA: Diagnosis not present

## 2020-05-29 LAB — CBC WITH DIFFERENTIAL (CANCER CENTER ONLY)
Abs Immature Granulocytes: 0.06 10*3/uL (ref 0.00–0.07)
Basophils Absolute: 0.1 10*3/uL (ref 0.0–0.1)
Basophils Relative: 1 %
Eosinophils Absolute: 0.3 10*3/uL (ref 0.0–0.5)
Eosinophils Relative: 3 %
HCT: 49.5 % — ABNORMAL HIGH (ref 36.0–46.0)
Hemoglobin: 16 g/dL — ABNORMAL HIGH (ref 12.0–15.0)
Immature Granulocytes: 1 %
Lymphocytes Relative: 32 %
Lymphs Abs: 3.2 10*3/uL (ref 0.7–4.0)
MCH: 29.5 pg (ref 26.0–34.0)
MCHC: 32.3 g/dL (ref 30.0–36.0)
MCV: 91.3 fL (ref 80.0–100.0)
Monocytes Absolute: 0.6 10*3/uL (ref 0.1–1.0)
Monocytes Relative: 6 %
Neutro Abs: 5.8 10*3/uL (ref 1.7–7.7)
Neutrophils Relative %: 57 %
Platelet Count: 247 10*3/uL (ref 150–400)
RBC: 5.42 MIL/uL — ABNORMAL HIGH (ref 3.87–5.11)
RDW: 18.4 % — ABNORMAL HIGH (ref 11.5–15.5)
WBC Count: 10 10*3/uL (ref 4.0–10.5)
nRBC: 0 % (ref 0.0–0.2)

## 2020-05-29 LAB — CMP (CANCER CENTER ONLY)
ALT: 13 U/L (ref 0–44)
AST: 15 U/L (ref 15–41)
Albumin: 4.7 g/dL (ref 3.5–5.0)
Alkaline Phosphatase: 68 U/L (ref 38–126)
Anion gap: 10 (ref 5–15)
BUN: 16 mg/dL (ref 8–23)
CO2: 24 mmol/L (ref 22–32)
Calcium: 10.5 mg/dL — ABNORMAL HIGH (ref 8.9–10.3)
Chloride: 106 mmol/L (ref 98–111)
Creatinine: 0.99 mg/dL (ref 0.44–1.00)
GFR, Estimated: >60 mL/min (ref >60)
Glucose, Bld: 143 mg/dL — ABNORMAL HIGH (ref 70–99)
Potassium: 4.7 mmol/L (ref 3.5–5.1)
Sodium: 140 mmol/L (ref 135–145)
Total Bilirubin: 0.6 mg/dL (ref 0.3–1.2)
Total Protein: 7.5 g/dL (ref 6.5–8.1)

## 2020-05-29 LAB — IRON AND TIBC
Iron: 73 ug/dL (ref 28–170)
Saturation Ratios: 20 % (ref 10.4–31.8)
TIBC: 372 ug/dL (ref 250–450)
UIBC: 299 ug/dL

## 2020-05-29 LAB — FERRITIN: Ferritin: 99 ng/mL (ref 11–307)

## 2020-05-29 MED ORDER — AMITRIPTYLINE HCL 25 MG PO TABS: 25.0000 mg | ORAL_TABLET | Freq: Every day | ORAL | 5 refills | Status: DC

## 2020-05-31 DIAGNOSIS — Z955 Presence of coronary angioplasty implant and graft: Secondary | ICD-10-CM | POA: Diagnosis not present

## 2020-05-31 DIAGNOSIS — Z951 Presence of aortocoronary bypass graft: Secondary | ICD-10-CM | POA: Diagnosis not present

## 2020-06-10 ENCOUNTER — Telehealth: Payer: Self-pay

## 2020-06-10 DIAGNOSIS — Z951 Presence of aortocoronary bypass graft: Secondary | ICD-10-CM | POA: Diagnosis not present

## 2020-06-10 DIAGNOSIS — Z955 Presence of coronary angioplasty implant and graft: Secondary | ICD-10-CM | POA: Diagnosis not present

## 2020-06-10 NOTE — Telephone Encounter (Addendum)
Pt notified of message below. She asked that I send her a copy of the labs as well. [phone]   ----- Message from Derwood Kaplan, MD sent at 06/09/2020  9:11 AM EST ----- Regarding: call pt Tell her labs all look good, incl iron levels and hgb is 16.0,  cc Dr. Maury Dus, her PCP

## 2020-06-11 DIAGNOSIS — Z955 Presence of coronary angioplasty implant and graft: Secondary | ICD-10-CM | POA: Diagnosis not present

## 2020-06-11 DIAGNOSIS — Z951 Presence of aortocoronary bypass graft: Secondary | ICD-10-CM | POA: Diagnosis not present

## 2020-06-17 DIAGNOSIS — Z951 Presence of aortocoronary bypass graft: Secondary | ICD-10-CM | POA: Diagnosis not present

## 2020-06-17 DIAGNOSIS — Z955 Presence of coronary angioplasty implant and graft: Secondary | ICD-10-CM | POA: Diagnosis not present

## 2020-06-19 DIAGNOSIS — Z951 Presence of aortocoronary bypass graft: Secondary | ICD-10-CM | POA: Diagnosis not present

## 2020-06-20 ENCOUNTER — Other Ambulatory Visit: Payer: Self-pay | Admitting: Oncology

## 2020-06-21 DIAGNOSIS — Z951 Presence of aortocoronary bypass graft: Secondary | ICD-10-CM | POA: Diagnosis not present

## 2020-06-24 DIAGNOSIS — Z951 Presence of aortocoronary bypass graft: Secondary | ICD-10-CM | POA: Diagnosis not present

## 2020-06-26 DIAGNOSIS — Z951 Presence of aortocoronary bypass graft: Secondary | ICD-10-CM | POA: Diagnosis not present

## 2020-07-02 ENCOUNTER — Other Ambulatory Visit: Payer: Self-pay

## 2020-07-02 DIAGNOSIS — I6523 Occlusion and stenosis of bilateral carotid arteries: Secondary | ICD-10-CM

## 2020-07-03 DIAGNOSIS — Z951 Presence of aortocoronary bypass graft: Secondary | ICD-10-CM | POA: Diagnosis not present

## 2020-07-08 DIAGNOSIS — Z951 Presence of aortocoronary bypass graft: Secondary | ICD-10-CM | POA: Diagnosis not present

## 2020-07-10 DIAGNOSIS — Z951 Presence of aortocoronary bypass graft: Secondary | ICD-10-CM | POA: Diagnosis not present

## 2020-07-11 DIAGNOSIS — Z951 Presence of aortocoronary bypass graft: Secondary | ICD-10-CM | POA: Diagnosis not present

## 2020-07-15 ENCOUNTER — Other Ambulatory Visit: Payer: Self-pay

## 2020-07-15 ENCOUNTER — Ambulatory Visit (INDEPENDENT_AMBULATORY_CARE_PROVIDER_SITE_OTHER): Payer: Medicare Other | Admitting: Physician Assistant

## 2020-07-15 ENCOUNTER — Ambulatory Visit: Payer: Medicare Other | Admitting: Oncology

## 2020-07-15 ENCOUNTER — Other Ambulatory Visit: Payer: Medicare Other

## 2020-07-15 ENCOUNTER — Ambulatory Visit (HOSPITAL_COMMUNITY)
Admission: RE | Admit: 2020-07-15 | Discharge: 2020-07-15 | Disposition: A | Discharge status: Home / Self Care | Payer: Medicare Other | Source: Ambulatory Visit | Attending: Physician Assistant | Admitting: Physician Assistant

## 2020-07-15 VITALS — BP 149/85 | HR 77 | Temp 98.1°F | Resp 20 | Ht 61.0 in | Wt 121.9 lb

## 2020-07-15 DIAGNOSIS — I6523 Occlusion and stenosis of bilateral carotid arteries: Secondary | ICD-10-CM | POA: Diagnosis not present

## 2020-07-15 NOTE — Progress Notes (Signed)
Office Note     CC:  follow up Requesting Provider:  Elias Else, MD  HPI: Faith Gaines is a 74 y.o. (Jun 09, 1946) female who presents for routine follow-up for carotid artery disease.  The patient is status post right carotid endarterectomy with Dacron patch angioplasty on June 29, 2013 by Dr. Hart Rochester.  This was for asymptomatic stenosis.  She has no prior history of stroke or TIA.  Her last follow-up visit was on December 22, 2018.  She underwent coronary artery bypass grafting on February 01, 2020.  Her primary complaint today is incisional/sternal pain.  She denies monocular blindness, slurred speech, facial drooping, extremity weakness, numbness or paralysis.  She denies fever or chills.  She is compliant with aspirin and statin.  Aspirin dose has been increased to 325 mg daily since coronary surgery. She has history of hypertension and is maintained on beta-blocker.  History of diabetes mellitus on oral agents.  Past Medical History:  Diagnosis Date  . Anxiety    no meds  . Breast cancer (HCC) 2015   left breast   . CKD (chronic kidney disease), stage III (HCC)    no meds, blood draws q 55mos by Dr. Nicholos Johns in [facility]  . Depression    no meds  . Diabetes mellitus without complication (HCC)    type 2  . GERD (gastroesophageal reflux disease)    occ  . Hyperlipidemia    diet controlled, no meds  . Hypertension   . Iron deficiency anemia   . Osteopenia   . Peripheral vascular disease (HCC)    rt carotid stenosis  . Pneumonia 12/2011, 05/2012   hx x2 in 2013  . PONV (postoperative nausea and vomiting)   . Trigger finger    resolved with cortisone injections    Past Surgical History:  Procedure Laterality Date  . ANTERIOR AND POSTERIOR REPAIR N/A 10/22/2014   Procedure: CYSTO, REPAIR CYSTOCELE/RECTOCELE;  Surgeon: Alfredo Martinez, MD;  Location: [facility];  Service: Urology;  Laterality: N/A;  . APPENDECTOMY    . BLADDER SURGERY  05/1981   bladder tack  .  BREAST LUMPECTOMY Left 2015  . CHOLECYSTECTOMY  1995  . COLONOSCOPY    . CORONARY ARTERY BYPASS GRAFT N/A 02/01/2020   Procedure: CORONARY ARTERY BYPASS GRAFTING (CABG) using LIMA to LAD; Endovein Harvested Greater Saphenous Vein: SVG to Diag1; SVG to OM1.;  Surgeon: Corliss Skains, MD;  Location: [facility] OR;  Service: Open Heart Surgery;  Laterality: N/A;  . ENDARTERECTOMY Right 06/29/2013   Procedure: ENDARTERECTOMY CAROTID-RIGHT, WITH DACRON PATCH ANGIOPLASTY;  Surgeon: Pryor Ochoa, MD;  Location: [facility] OR;  Service: Vascular;  Laterality: Right;  . ENDOVEIN HARVEST OF GREATER SAPHENOUS VEIN Left 02/01/2020   Procedure: ENDOVEIN HARVEST OF GREATER SAPHENOUS VEIN;  Surgeon: Corliss Skains, MD;  Location: [facility] OR;  Service: Open Heart Surgery;  Laterality: Left;  . LAPAROSCOPIC ASSISTED VAGINAL HYSTERECTOMY N/A 10/22/2014   Procedure: LAPAROSCOPIC ASSISTED VAGINAL HYSTERECTOMY;  Surgeon: Zelphia Cairo, MD;  Location: [facility];  Service: Gynecology;  Laterality: N/A;  Pam from Dr. Mina Marble office will call and add additional surgery for this patient in our block time.  Marland Kitchen LEFT HEART CATH AND CORONARY ANGIOGRAPHY N/A 01/26/2020   Procedure: LEFT HEART CATH AND CORONARY ANGIOGRAPHY;  Surgeon: Yvonne Kendall, MD;  Location: [facility];  Service: Cardiovascular;  Laterality: N/A;  . MELANOMA EXCISION  12/1980   left leg,  . PARTIAL MASTECTOMY WITH NEEDLE LOCALIZATION Left 08/22/2013  Procedure: PARTIAL MASTECTOMY WITH NEEDLE LOCALIZATION;  Surgeon: Stark Klein, MD;  Location: [facility];  Service: General;  Laterality: Left;  . SALPINGOOPHORECTOMY Bilateral 10/22/2014   Procedure: SALPINGO OOPHORECTOMY;  Surgeon: Marylynn Pearson, MD;  Location: [facility];  Service: Gynecology;  Laterality: Bilateral;  . 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 TEE WITHOUT CARDIOVERSION N/A 02/01/2020   Procedure: TRANSESOPHAGEAL ECHOCARDIOGRAM (TEE);  Surgeon: Lajuana Matte, MD;  Location: [facility];  Service: Open Heart Surgery;  Laterality: N/A;  . TUBAL LIGATION    . VAGINAL PROLAPSE REPAIR N/A 10/22/2014   Procedure: VAULT PROLAPSE WITH GRAFT;  Surgeon: Bjorn Loser, MD;  Location: [facility];  Service: Urology;  Laterality: N/A;  . WISDOM TOOTH EXTRACTION      Social History   Socioeconomic History  . Marital status: Widowed    Spouse name: Not on file  . Number of children: Not on file  . Years of education: Not on file  . 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 Smoking status: Former Smoker    Packs/day: 0.25    Years: 3.00    Pack years: 0.75    Types: Cigarettes    Quit date: 07/20/2010    Years since quitting: 9.9  . Smokeless tobacco: Never Used  Vaping Use  . Vaping Use: Never used  Substance and Sexual Activity  . Alcohol use: No    Alcohol/week: 0.0 standard drinks  . Drug use: No  . Sexual activity: Yes    Birth control/protection: Post-menopausal  Other Topics Concern  . Not on file  Social History Narrative   Widow.  Lives alone.  Three children and 9 grands.     Social Determinants of Health   Financial Resource Strain: Not on file  Food Insecurity: Not on file  Transportation Needs: Not on file  Physical Activity: Not on file  Stress: Not on file  Social Connections: Not on file  Intimate Partner Violence: Not on file   Family History  Problem Relation Age of Onset  . Cancer Mother        breast, uterus, lung  . Heart attack Mother 58  . Stroke Mother   . Heart disease Mother        Open Heart Surgery  . Hypertension Mother   . Varicose Veins Mother   . Heart disease Father   . Heart attack Father 27       Died of MI age 72  . Kidney disease Father   . Hypertension Father   . Diabetes Father   . Cancer Father   . Asthma Father   . Heart attack Sister 70       ICD  . Heart disease Sister   . Healthy Daughter   . Healthy Daughter    . Healthy Son     Current Outpatient Medications  Medication Sig Dispense Refill  . amitriptyline (ELAVIL) 25 MG tablet TAKE 1 TABLET BY MOUTH EVERYDAY AT BEDTIME 90 tablet 2  . aspirin EC 325 MG EC tablet Take 1 tablet (325 mg total) by mouth daily.    . Canagliflozin (INVOKANA) 300 MG TABS Take 300 mg by mouth daily.     . Multiple Vitamins-Minerals (PRESERVISION AREDS 2 PO) Take 1 tablet by mouth 2 (two) times daily.    . nebivolol (BYSTOLIC) 5 MG tablet Take  5 mg by mouth daily.    . Nebivolol HCl (BYSTOLIC) 20 MG TABS Take 1 tablet by mouth daily.    . Omega-3 Fatty Acids (FISH OIL) 1000 MG CAPS Take 1,000 mg by mouth 2 (two) times daily.    . Omega-3 Fatty Acids (OMEGA-3 2100) 1050 MG CAPS Take 1 capsule by mouth daily.     . pantoprazole (PROTONIX) 40 MG tablet Take 40 mg by mouth daily.    . Pitavastatin Calcium (LIVALO) 2 MG TABS Take 2 mg by mouth 2 (two) times a week.    . pregabalin (LYRICA) 25 MG capsule Take 1 capsule (25 mg total) by mouth 2 (two) times daily. 60 capsule 0  . Saxagliptin-Metformin (KOMBIGLYZE XR) 2.11-998 MG TB24 Take 1 tablet by mouth 2 (two) times daily. Reported on 08/21/2015    . VITAMIN D PO Take 1,000 Units by mouth daily.    . vitamin E 1000 UNIT capsule Take 1,000 Units by mouth daily.     No current facility-administered medications for this visit.    Allergies  Allergen Reactions  . Codeine Nausea And Vomiting and Other (See Comments)    Headache and vomiting  . Ace Inhibitors     Drop in GFR  . Clarithromycin     Unable to sleep   . Crestor [Rosuvastatin]     Body aches  . Lipitor [Atorvastatin]     Body aches  . Avandamet [Rosiglitazone-Metformin] Rash and Other (See Comments)    Makes her hyper     REVIEW OF SYSTEMS:   [X]  denotes positive finding, [ ]  denotes negative finding Cardiac  Comments:  Chest pain or chest pressure: x  see HPI  Shortness of breath upon exertion:    Short of breath when lying flat:    Irregular  heart rhythm:        Vascular    Pain in calf, thigh, or hip brought on by ambulation:    Pain in feet at night that wakes you up from your sleep:     Blood clot in your veins:    Leg swelling:         Pulmonary    Oxygen at home:    Productive cough:     Wheezing:         Neurologic    Sudden weakness in arms or legs:     Sudden numbness in arms or legs:     Sudden onset of difficulty speaking or slurred speech:    Temporary loss of vision in one eye:     Problems with dizziness:         Gastrointestinal    Blood in stool:     Vomited blood:         Genitourinary    Burning when urinating:     Blood in urine:        Psychiatric    Major depression:         Hematologic    Bleeding problems:    Problems with blood clotting too easily:        Skin    Rashes or ulcers:        Constitutional    Fever or chills:      PHYSICAL EXAMINATION:  Vitals:   07/15/20 1028 07/15/20 1031  BP: (!) 144/79 (!) 149/85  Pulse: 77   Resp: 20   Temp: 98.1 F (36.7 C)   SpO2: 98%    General:  WDWN in NAD; vital signs  documented above Gait: Unaided, no ataxia HENT: WNL, normocephalic Pulmonary: normal non-labored breathing , without Rales, rhonchi,  wheezing Cardiac: regular HR, without  Murmurs without carotid bruit Abdomen: soft, NT, no masses Skin: without rashes Vascular Exam/Pulses: 2+ symmetrical radial pulses bilaterally Extremities: Normal upper and lower extremity motor function Musculoskeletal: no muscle wasting or atrophy  Neurologic: A&O X 3;  No focal weakness or paresthesias are detected Psychiatric:  The pt has Normal affect.   Non-Invasive Vascular Imaging:   07/15/2020 Right Carotid: Patent carotid endarterectomy with velocity in the right  ICA consistent with a 1-39% stenosis.   Left Carotid: Velocities in the left ICA are consistent with a 1-39%  stenosis.   Vertebrals: Bilateral vertebral arteries demonstrate antegrade flow.      ASSESSMENT/PLAN:: 74 y.o. female here for follow up for bilateral carotid artery stenosis status post right CEA 7 years ago.  Duplex ultrasound today reveals stable bilateral carotid artery stenosis in the 1 to 39% stenosis range.  No symptoms of stroke or TIA.  She is having persistent ongoing chest discomfort status post coronary artery bypass grafting.  I encouraged her to phone her cardiothoracic surgery office today for reevaluation. We reviewed signs and symptoms of stroke/TIA and advised her to seek immediate medical attention should these occur.  We will follow-up in 18 months with repeat carotid duplex  Barbie Banner, PA-C [facility] [phone]  Clinic MD:   Dr. Oneida Alar on call

## 2020-07-16 ENCOUNTER — Telehealth: Payer: Self-pay | Admitting: *Deleted

## 2020-07-16 NOTE — Telephone Encounter (Signed)
Faith Gaines called stating the bottom portion of her sternal incision is red, slightly swollen, and painful at times. Pt is s/p CABG 02/01/20. Pt was seen by Vascular surgery PA yesterday who assessed the incision and recommended for the pt to reach out to Dr. Cliffton Asters. Pt states the incision has been slightly red at the distal end and she has been dealing with this pain since surgery. Pt states the Lyrica prescribed for neuropathy did not alleviate the pain she is experiencing. Per pt, the PA she saw stated the lower incision looked puffy. An appt is scheduled for Faith Gaines to see Dr. Cliffton Asters on Friday, January 7 at 1015. Pt verbalizes understanding.

## 2020-07-26 ENCOUNTER — Ambulatory Visit (INDEPENDENT_AMBULATORY_CARE_PROVIDER_SITE_OTHER): Payer: Medicare Other | Admitting: Thoracic Surgery (Cardiothoracic Vascular Surgery)

## 2020-07-26 ENCOUNTER — Encounter: Payer: Self-pay | Admitting: Thoracic Surgery (Cardiothoracic Vascular Surgery)

## 2020-07-26 ENCOUNTER — Ambulatory Visit
Admission: RE | Admit: 2020-07-26 | Discharge: 2020-07-26 | Disposition: A | Discharge status: Home / Self Care | Payer: Medicare Other | Source: Ambulatory Visit | Attending: Thoracic Surgery (Cardiothoracic Vascular Surgery) | Admitting: Thoracic Surgery (Cardiothoracic Vascular Surgery)

## 2020-07-26 ENCOUNTER — Other Ambulatory Visit: Payer: Self-pay

## 2020-07-26 ENCOUNTER — Other Ambulatory Visit: Payer: Self-pay | Admitting: *Deleted

## 2020-07-26 VITALS — BP 148/80 | HR 82 | Resp 20 | Ht 61.0 in | Wt 120.0 lb

## 2020-07-26 DIAGNOSIS — R0600 Dyspnea, unspecified: Secondary | ICD-10-CM

## 2020-07-26 DIAGNOSIS — I200 Unstable angina: Secondary | ICD-10-CM

## 2020-07-26 DIAGNOSIS — Z951 Presence of aortocoronary bypass graft: Secondary | ICD-10-CM | POA: Diagnosis not present

## 2020-07-26 DIAGNOSIS — Z4889 Encounter for other specified surgical aftercare: Secondary | ICD-10-CM

## 2020-07-26 DIAGNOSIS — R0602 Shortness of breath: Secondary | ICD-10-CM | POA: Diagnosis not present

## 2020-07-26 MED ORDER — PREGABALIN 25 MG PO CAPS: 25.0000 mg | ORAL_CAPSULE | Freq: Two times a day (BID) | ORAL | 3 refills | Status: DC

## 2020-07-26 NOTE — Progress Notes (Signed)
     [address]       [place] [address]             [phone]       Faith Gaines is a 75 year old female is well-known to our service.  She underwent a coronary artery bypass grafting in July 2021.  Her postoperative course was uncomplicated and she was seen in follow-up in standard fashion.  She comes in today complaining of incisional pain that radiates to her left shoulder and back.  This has been present since the time of surgery.  It is reproducible with palpation.  She originally was prescribed Lyrica with some relief of this pain.  Today's Vitals   07/26/20 1026  BP: (!) 148/80  Pulse: 82  Resp: 20  SpO2: 98%  Weight: 120 lb (54.4 kg)  Height: 5\' 1"  (1.549 m)   Body mass index is 22.67 kg/m. Her incision is well-healed.  She does have point tenderness over the incision and her anterior chest wall.  75 year old female status post CABG who continues to have chest wall tenderness.  She states that he never fully performed stretching exercises massages during her cardiac rehab due to this pain.  Of ordered a CT scan of the chest to evaluate for any infectious etiology, but this is likely ongoing costochondral pain.  I prescribed her refills of her Lyrica and will review the CT scan and let her know the results.  Faith Gaines

## 2020-07-29 ENCOUNTER — Ambulatory Visit: Payer: Medicare Other | Admitting: Oncology

## 2020-07-29 NOTE — Progress Notes (Signed)
[facility]  [address] [place],  [place]  [address] [phone]  Clinic Day:  07/31/2020  Referring physician: Maury Dus, MD   This document serves as a record of services personally performed by Hosie Poisson, MD. It was created on their behalf by Curry,Lauren E, a trained medical scribe. The creation of this record is based on the scribe's personal observations and the provider's statements to them.   CHIEF COMPLAINT:  CC: History of stage 0 left breast cancer  Current Treatment:  Surveillance   HISTORY OF PRESENT ILLNESS:  Faith Gaines is a 75 y.o. female with a history of stage 0 left breast cancer diagnosed in November of 2014 and treated with a lumpectomy in February of 2015. 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  This measured 0.1 cm for a Tis N0 N0.  She was offered chemoprevention but declined because of many other comorbidities.  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  She does have osteopenia and her last bone density scan was done in January of 2015. She gets these done through her gynecologist in [place].  In April of 2016, she had a total abdominal hysterectomy and bilateral salpingo-oophorectomy.  She did have a iron deficiency anemia in the past, but this corrected.  She tells me that she met with her primary care physician in December of 2016, and she was anemic again with a hemoglobin down to 11.  She was found to have iron deficiency.  She did complain of occasional soreness of her tongue, dizziness, and craving for ice.  She was placed on slow release iron after she was not able to tolerate ferrous sulfate.  She was fatigued and weak with dyspnea on exertion, and had daily headaches as well.  She had severe bleeding from the rectum which she attributes to hemorrhoids. Studies still showed evidence of iron  deficiency and so she was given 2 injections of Feraheme at the end of December  2016, 1 week apart.  She has had endoscopy and colonoscopy through Dr. Collene Mares in [place] several years ago, so she was referred back, and had these repeated in January of 2017.  EGD revealed diffuse moderate gastritis and colonoscopy revealed hemorrhoids.  She was told to avoid all nonsteroidal anti-inflammatory medications.    She has been diagnosed with lichen planopilaris and is using a liquid steroid from the dermatologist in [place], and topical Rogaine.  They referred her to [facility], and they felt this was immune mediated and placed her on hydroxychloroquine 200 mg twice daily.  She feels this is helping some.  She had a biopsy in the past of her right upper arm by a dermatologist in [place], Dr. Renda Rolls, and was told this was "pre melanoma".  She had a bone density scan 2 years ago, which apparently was not as good as previous.  She underwent triple bypass surgery in June 2021 and her hemoglobin was 9.0 at the last check with her cardiologist.  They took veins from her left leg and left chest.  Since her surgery, she has had pain within that left breast.  When she was seen in September 2021, she was still having a lot of post-thoracotomy pain and so I placed her on a low dose of Elavil.  INTERVAL HISTORY:  Faith Gaines is here for routine follow up and continues to have intermittent pain  of her central sternal chest wall and left breast which she rates as a 9/10 today.  We had tried her on Elavil 25 mg at bedtime, but she states that this made her drowsy.  Dr. Kipp Brood has placed her on Lyrica 25 mg twice daily as of last week, and so I advised that she discontinue the Elavil.  She reports fatigue.  CT chest from January 7th revealed mild emphysematous changes but no acute pulmonary findings or worrisome pulmonary lesions.  No mediastinal or hilar mass or adenopathy was observed.  We reviewed these images  today.  She had blood work today at Dr. Noland Fordyce office.  Her  appetite is good, and she has gained 7 pounds since her last visit.  She denies fever, chills or other signs of infection.  She denies nausea, vomiting, bowel issues, or abdominal pain.  She denies sore throat, cough, dyspnea, or chest pain.  REVIEW OF SYSTEMS:  Review of Systems  Constitutional: Positive for fatigue.  HENT:  Negative.   Eyes: Negative.   Respiratory: Negative.   Cardiovascular: Negative.   Gastrointestinal: Negative.   Endocrine: Negative.   Genitourinary: Negative.    Musculoskeletal: Positive for arthralgias.       Persistent pain of the central sternal chest wall and left breast, intermittent  Skin: Negative.   Neurological: Negative.   Hematological: Negative.   Psychiatric/Behavioral: Negative.      VITALS:  Blood pressure (!) 157/72, pulse 80, temperature 98.3 F (36.8 C), resp. rate 16, weight 119 lb 11.2 oz (54.3 kg), SpO2 95 %.  Wt Readings from Last 3 Encounters:  07/31/20 119 lb 11.2 oz (54.3 kg)  07/26/20 120 lb (54.4 kg)  07/15/20 121 lb 14.4 oz (55.3 kg)    Body mass index is 22.62 kg/m.  Performance status (ECOG): 1 - Symptomatic but completely ambulatory  PHYSICAL EXAM:  Physical Exam Constitutional:      General: She is not in acute distress.    Appearance: Normal appearance. She is normal weight.  HENT:     Head: Normocephalic and atraumatic.  Eyes:     General: No scleral icterus.    Extraocular Movements: Extraocular movements intact.     Conjunctiva/sclera: Conjunctivae normal.     Pupils: Pupils are equal, round, and reactive to light.  Cardiovascular:     Rate and Rhythm: Normal rate and regular rhythm.     Pulses: Normal pulses.     Heart sounds: Normal heart sounds. No murmur heard. No friction rub. No gallop.   Pulmonary:     Effort: Pulmonary effort is normal. No respiratory distress.     Breath sounds: Normal breath sounds.  Chest:     Comments: Both  breasts are without masses.  She does have tenderness in the upper inner quadrant of the left breast.  The sternotomy incision is well healed. Abdominal:     General: Bowel sounds are normal. There is no distension.     Palpations: Abdomen is soft. There is no mass.     Tenderness: There is no abdominal tenderness.  Musculoskeletal:        General: Normal range of motion.     Cervical back: Normal range of motion and neck supple.     Right lower leg: No edema.     Left lower leg: No edema.  Lymphadenopathy:     Cervical: No cervical adenopathy.  Skin:    General: Skin is warm and dry.  Neurological:     General: No focal  deficit present.     Mental Status: She is alert and oriented to person, place, and time. Mental status is at baseline.  Psychiatric:        Mood and Affect: Mood normal.        Behavior: Behavior normal.        Thought Content: Thought content normal.        Judgment: Judgment normal.     LABS:   CBC Latest Ref Rng & Units 05/29/2020 02/04/2020 02/03/2020  WBC 4.0 - 10.5 K/uL 10.0 10.7(H) 11.0(H)  Hemoglobin 12.0 - 15.0 g/dL 16.0(H) 9.1(L) 9.1(L)  Hematocrit 36.0 - 46.0 % 49.5(H) 30.2(L) 30.4(L)  Platelets 150 - 400 K/uL 247 149(L) 127(L)   CMP Latest Ref Rng & Units 05/29/2020 02/04/2020 02/03/2020  Glucose 70 - 99 mg/dL 143(H) 134(H) 148(H)  BUN 8 - 23 mg/dL 16 12 13   Creatinine 0.44 - 1.00 mg/dL 0.99 0.74 0.94  Sodium 135 - 145 mmol/L 140 139 135  Potassium 3.5 - 5.1 mmol/L 4.7 4.7 4.4  Chloride 98 - 111 mmol/L 106 108 106  CO2 22 - 32 mmol/L 24 23 21(L)  Calcium 8.9 - 10.3 mg/dL 10.5(H) 9.9 9.6  Total Protein 6.5 - 8.1 g/dL 7.5 - -  Total Bilirubin 0.3 - 1.2 mg/dL 0.6 - -  Alkaline Phos 38 - 126 U/L 68 - -  AST 15 - 41 U/L 15 - -  ALT 0 - 44 U/L 13 - -    Lab Results  Component Value Date   TIBC 372 05/29/2020   FERRITIN 99 05/29/2020   IRONPCTSAT 20 05/29/2020     STUDIES:  CT CHEST WO CONTRAST  Result Date: 07/26/2020 CLINICAL DATA:   Chest pain and shortness of breath for 6 months. History of left breast cancer status post lumpectomy. EXAM: CT CHEST WITHOUT CONTRAST TECHNIQUE: Multidetector CT imaging of the chest was performed following the standard protocol without IV contrast. COMPARISON:  CT heart 01/25/2020 FINDINGS: Cardiovascular: The heart is normal in size. No pericardial effusion. Stable advanced atherosclerotic calcifications involving the thoracic aorta and coronary arteries. No aneurysm. Stable surgical changes from coronary artery bypass surgery. Mediastinum/Nodes: No mediastinal or hilar mass or lymphadenopathy. The esophagus is grossly normal. Lungs/Pleura: Mild emphysematous changes. No worrisome pulmonary lesions. No infiltrates, edema or effusions. No interstitial lung disease or bronchiectasis. Upper Abdomen: No significant upper abdominal findings. Aortic and branch vessel calcifications are noted. No hepatic or adrenal gland lesions are identified. The gallbladder is surgically absent. Musculoskeletal: No breast masses, supraclavicular or axillary adenopathy. Surgical changes noted involving the left breast. No significant bony findings. The patient has median sternotomy is largely ununited. No acute bony findings. IMPRESSION: 1. Mild emphysematous changes but no acute pulmonary findings or worrisome pulmonary lesions. 2. No mediastinal or hilar mass or adenopathy. 3. Stable advanced atherosclerotic calcifications involving the thoracic aorta and coronary arteries. Stable surgical changes from coronary artery bypass surgery. 4. Emphysema and aortic atherosclerosis. Aortic Atherosclerosis (ICD10-I70.0) and Emphysema (ICD10-J43.9). Electronically Signed   By: Marijo Sanes M.D.   On: 07/26/2020 12:09   VAS US CAROTID  Result Date: 07/17/2020 Carotid Arterial Duplex Study Indications:   Carotid artery disease and Right carotid endarterectomy                06/29/2013. Risk Factors:  Hypertension, hyperlipidemia, insulin  dependent diabetes                mellitus. Other Factors: CABG 02/01/2020 . Performing Technologist: Alvia Grove RVT  Examination Guidelines: A complete evaluation includes B-mode imaging, spectral Doppler, color Doppler, and power Doppler as needed of all accessible portions of each vessel. Bilateral testing is considered an integral part of a complete examination. Limited examinations for reoccurring indications may be performed as noted.  Right Carotid Findings: +----------+--------+--------+--------+------------------+--------+           PSV cm/sEDV cm/sStenosisPlaque DescriptionComments +----------+--------+--------+--------+------------------+--------+ CCA Prox  71      11                                         +----------+--------+--------+--------+------------------+--------+ CCA Mid   70      12                                         +----------+--------+--------+--------+------------------+--------+ CCA Distal54      11                                         +----------+--------+--------+--------+------------------+--------+ ICA Prox  56      14      1-39%                              +----------+--------+--------+--------+------------------+--------+ ICA Mid   55      17                                         +----------+--------+--------+--------+------------------+--------+ ICA Distal66      20                                         +----------+--------+--------+--------+------------------+--------+ ECA       90      10              heterogenous               +----------+--------+--------+--------+------------------+--------+ +----------+--------+-------+----------------+-------------------+           PSV cm/sEDV cmsDescribe        Arm Pressure (mmHG) +----------+--------+-------+----------------+-------------------+ [id number]     4      Multiphasic, WNL                     +----------+--------+-------+----------------+-------------------+ +---------+--------+--+--------+--+---------+ VertebralPSV cm/s51EDV cm/s12Antegrade +---------+--------+--+--------+--+---------+  Left Carotid Findings: +----------+--------+--------+--------+------------------+--------+           PSV cm/sEDV cm/sStenosisPlaque DescriptionComments +----------+--------+--------+--------+------------------+--------+ CCA Prox  88      8                                          +----------+--------+--------+--------+------------------+--------+ CCA Mid   71      14                                         +----------+--------+--------+--------+------------------+--------+ CCA Distal67  16              heterogenous               +----------+--------+--------+--------+------------------+--------+ ICA Prox  55      16      1-39%   heterogenous               +----------+--------+--------+--------+------------------+--------+ ICA Mid   63      21                                         +----------+--------+--------+--------+------------------+--------+ ICA Distal69      21                                         +----------+--------+--------+--------+------------------+--------+ ECA       82      10              heterogenous               +----------+--------+--------+--------+------------------+--------+ +----------+--------+--------+----------------+-------------------+           PSV cm/sEDV cm/sDescribe        Arm Pressure (mmHG) +----------+--------+--------+----------------+-------------------+ Subclavian107     1       Multiphasic, WNL                    +----------+--------+--------+----------------+-------------------+ +---------+--------+--+--------+--+---------+ VertebralPSV cm/s43EDV cm/s11Antegrade +---------+--------+--+--------+--+---------+   Summary: Right Carotid: Patent carotid endarterectomy with velocity in the right ICA                 consistent with a 1-39% stenosis. Left Carotid: Velocities in the left ICA are consistent with a 1-39% stenosis. Vertebrals: Bilateral vertebral arteries demonstrate antegrade flow. *See table(s) above for measurements and observations.  Electronically signed by Ruta Hinds MD on 07/17/2020 at 9:21:16 AM.    Final      Allergies:  Allergies  Allergen Reactions  . Codeine Nausea And Vomiting and Other (See Comments)    Headache and vomiting  . Ace Inhibitors     Drop in GFR  . Clarithromycin     Unable to sleep   . Crestor [Rosuvastatin]     Body aches  . Hydroxychloroquine Sulfate Other (See Comments)  . Iodine     Other reaction(s): fungus?  . Lipitor [Atorvastatin]     Body aches  . Other     Other reaction(s): body aches Other reaction(s): rash  . Avandamet [Rosiglitazone-Metformin] Rash and Other (See Comments)    Makes her hyper    Current Medications: Current Outpatient Medications  Medication Sig Dispense Refill  . amitriptyline (ELAVIL) 25 MG tablet TAKE 1 TABLET BY MOUTH EVERYDAY AT BEDTIME 90 tablet 2  . amLODipine (NORVASC) 5 MG tablet 1 tablet    . aspirin EC 325 MG EC tablet Take 1 tablet (325 mg total) by mouth daily.    . canagliflozin (INVOKANA) 300 MG TABS tablet Take 300 mg by mouth daily.     . famotidine (PEPCID) 40 MG tablet 1 tablet    . Multiple Vitamins-Minerals (PRESERVISION AREDS 2 PO) Take 1 tablet by mouth 2 (two) times daily.    . nebivolol (BYSTOLIC) 5 MG tablet Take 5 mg by mouth daily.    . Nebivolol HCl (BYSTOLIC) 20 MG TABS Take 1 tablet by  mouth daily.    . Omega-3 Fatty Acids (FISH OIL) 1000 MG CAPS Take 1,000 mg by mouth 2 (two) times daily.    . Omega-3 Fatty Acids (OMEGA-3 2100) 1050 MG CAPS Take 1 capsule by mouth daily.     . pantoprazole (PROTONIX) 40 MG tablet Take 40 mg by mouth daily.    . Pitavastatin Calcium (LIVALO) 2 MG TABS Take 2 mg by mouth 2 (two) times a week.    . pregabalin (LYRICA) 25 MG capsule Take  1 capsule (25 mg total) by mouth 2 (two) times daily. 60 capsule 3  . Saxagliptin-Metformin 2.11-998 MG TB24 Take 1 tablet by mouth 2 (two) times daily. Reported on 08/21/2015    . VITAMIN D PO Take 1,000 Units by mouth daily.    . vitamin E 1000 UNIT capsule Take 1,000 Units by mouth daily.     No current facility-administered medications for this visit.     ASSESSMENT & PLAN:   Assessment:  1. Stage 0 breast cancer November 2014, treated with lumpectomy.  She has no evidence of disease.  2. Severe iron deficiency in the past, treated with intravenous supplement.  She had iron deficiency again in September 2021, treated with IV iron.  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  4. Hypercalcemia, stable, with normal vitamin-D level.   5. History of malignant melanoma resected from her left leg at age 20. No evidence of disease.  6. History of "pre melanoma" of her right upper arm resected.  The pathology revealed an inflamed junctional nevus with moderate atypia.  7. Osteopenia.  8. History of total abdominal hysterectomy and bilateral salpingo-oophorectomy.  9.  Triple bypass surgery in June 2021.  She completed cardiac rehab.  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  I tried her on Elavil 25 mg at bedtime, but this made her drowsy, so it will be stopped.  She now has been placed on Lyrica 25 mg twice daily and it is too soon to tell, but I think it will help her, she may need higher dosing.  Plan: As she is now on Lyrica 25 mg BID for her chest wall and left breast pain, I advised that she discontinue Elavil 25 mg at bedtime.  We will hold off on mammography for now with her pain as she recently had CT chest imaging and this was clear.   I reviewed these images with her today.  We will plan to see her back in 6 months with bilateral mammogram for reexamiantion.  She will contact us if she feels she cannot tolerate  mammography at that time.  I will be glad to see her back if problems arise regarding her breast cancer or her iron deficiency.  She understands and agrees with this plan of care.   I provided 30 minutes of face-to-face time during this this encounter and > 50% was spent counseling as documented under my assessment and plan.    Derwood Kaplan, MD [facility] [address] [place] [address] Dept: [phone] Dept Fax: [phone]   I, Rita Ohara, am acting as scribe for Derwood Kaplan, MD  I have reviewed this report as typed by the medical scribe, and it is complete and accurate.

## 2020-07-31 ENCOUNTER — Other Ambulatory Visit: Payer: Self-pay

## 2020-07-31 ENCOUNTER — Telehealth: Payer: Self-pay | Admitting: Oncology

## 2020-07-31 ENCOUNTER — Inpatient Hospital Stay: Payer: Medicare Other | Attending: Oncology | Admitting: Oncology

## 2020-07-31 ENCOUNTER — Other Ambulatory Visit: Payer: Self-pay | Admitting: Oncology

## 2020-07-31 VITALS — BP 157/72 | HR 80 | Temp 98.3°F | Resp 16 | Wt 119.7 lb

## 2020-07-31 DIAGNOSIS — K219 Gastro-esophageal reflux disease without esophagitis: Secondary | ICD-10-CM | POA: Diagnosis not present

## 2020-07-31 DIAGNOSIS — E782 Mixed hyperlipidemia: Secondary | ICD-10-CM | POA: Diagnosis not present

## 2020-07-31 DIAGNOSIS — D509 Iron deficiency anemia, unspecified: Secondary | ICD-10-CM | POA: Diagnosis not present

## 2020-07-31 DIAGNOSIS — Z170 Estrogen receptor positive status [ER+]: Secondary | ICD-10-CM | POA: Diagnosis not present

## 2020-07-31 DIAGNOSIS — I129 Hypertensive chronic kidney disease with stage 1 through stage 4 chronic kidney disease, or unspecified chronic kidney disease: Secondary | ICD-10-CM | POA: Diagnosis not present

## 2020-07-31 DIAGNOSIS — E1122 Type 2 diabetes mellitus with diabetic chronic kidney disease: Secondary | ICD-10-CM | POA: Diagnosis not present

## 2020-07-31 DIAGNOSIS — Z1231 Encounter for screening mammogram for malignant neoplasm of breast: Secondary | ICD-10-CM

## 2020-07-31 DIAGNOSIS — C4372 Malignant melanoma of left lower limb, including hip: Secondary | ICD-10-CM

## 2020-07-31 DIAGNOSIS — C50412 Malignant neoplasm of upper-outer quadrant of left female breast: Secondary | ICD-10-CM

## 2020-07-31 DIAGNOSIS — G629 Polyneuropathy, unspecified: Secondary | ICD-10-CM | POA: Diagnosis not present

## 2020-07-31 DIAGNOSIS — N1831 Chronic kidney disease, stage 3a: Secondary | ICD-10-CM | POA: Diagnosis not present

## 2020-07-31 DIAGNOSIS — I739 Peripheral vascular disease, unspecified: Secondary | ICD-10-CM | POA: Diagnosis not present

## 2020-07-31 DIAGNOSIS — I200 Unstable angina: Secondary | ICD-10-CM | POA: Diagnosis not present

## 2020-07-31 DIAGNOSIS — D500 Iron deficiency anemia secondary to blood loss (chronic): Secondary | ICD-10-CM

## 2020-07-31 DIAGNOSIS — Z0000 Encounter for general adult medical examination without abnormal findings: Secondary | ICD-10-CM | POA: Diagnosis not present

## 2020-07-31 DIAGNOSIS — M545 Low back pain, unspecified: Secondary | ICD-10-CM | POA: Diagnosis not present

## 2020-07-31 DIAGNOSIS — Z1389 Encounter for screening for other disorder: Secondary | ICD-10-CM | POA: Diagnosis not present

## 2020-07-31 DIAGNOSIS — Z6822 Body mass index (BMI) 22.0-22.9, adult: Secondary | ICD-10-CM | POA: Diagnosis not present

## 2020-07-31 NOTE — Telephone Encounter (Signed)
Per 1/12 los next appt given to patient 

## 2020-08-20 NOTE — Progress Notes (Signed)
Cardiology Office Note   Date:  08/23/2020   ID:  Faith Gaines, DOB September 27, 1945, MRN [id number]  PCP:  Maury Dus, MD  Cardiologist: Dr.Hochrein  CC: Follow Up   History of Present Illness: Faith Gaines is a 75 y.o. female who presents for ongoing assessment and management of coronary artery disease, three-vessel CAD with CABG on 02/01/2020 (LIMA to LAD, greater saphenous vein to diagonal 1, SVG to OM1.).  When last seen by Dr. Percival Spanish on 05/21/2020 she continued to have some postoperative pain in the lower sternal incisional site.  She also had some breast tenderness.  Dr. Percival Spanish suggested a short course of Motrin to help with her discomfort.  She was also to follow-up with breast imaging, as she was noted to have a history of breast cancer.  She is also being followed by Dr. Alyson Ingles, for peripheral arterial disease, diabetes, as he is her PCP.  She was seen by Dr. Kipp Brood, cardiovascular surgeon, on 07/26/2020 who also noted that she was having some chest discomfort and incisional pain.  It was reproducible with palpation.  He felt it was costochondral pain but went ahead and ordered a CT scan of the chest to evaluate for any infectious etiology.  She was given refills on Lyrica.  CT scan did not show evidence of infection, however did show mild emphysematous changes and aortic atherosclerosis.  She comes today with multiple musculoskeletal complaints.  Mostly pain in her shoulders back and still in her chest with deep breaths.  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  She was reassured by the CT scan.  She states that she walks 30 minutes a day on a treadmill and does have some shortness of breath associated with it.  She is able to complete her ADLs, clean her house, and go to the grocery store without issue.   Past Medical History:  Diagnosis Date  . Anxiety    no meds  . Breast cancer ([place]) 2015   left breast   .  CKD (chronic kidney disease), stage III (HCC)    no meds, blood draws q 25mo by Dr. [facility]son Ingles[facility] . Depression    no meds  . Diabetes mellitus without complication ([facility]    type 2  . GERD (gastroesophageal reflux disease)    occ  . Hyperlipidemia    diet controlled, no meds  . Hypertension   . Iron deficiency anemia   . Osteopenia   . Peripheral vascular disease ([facility]    rt carotid stenosis  . Pneumonia 12/2011, 05/2012   hx x2 in 2013  . PONV (postoperative nausea and vomiting)   . Trigger finger    resolved with cortisone injections    Past Surgical History:  Procedure Laterality Date  . ANTERIOR AND POSTERIOR REPAIR N/A 10/22/2014   Procedure: CYSTO, REPAIR CYSTOCELE/RECTOCELE;  Surgeon: SBjorn Loser MD;  [facility];  Service: Urology;  Laterality: N/A;  . APPENDECTOMY    . BLADDER SURGERY  05/1981   bladder tack  . BREAST LUMPECTOMY Left 2015  . CHOLECYSTECTOMY  1995  . COLONOSCOPY    . CORONARY ARTERY BYPASS GRAFT N/A 02/01/2020   Procedure: CORONARY ARTERY BYPASS GRAFTING (CABG) using LIMA to LAD; Endovein Harvested Greater Saphenous Vein: SVG to Diag1; SVG to OM1.;  Surgeon: LLajuana Matte MD;  [facility]  Service: Open Heart Surgery;  Laterality: N/A;  . ENDARTERECTOMY Right  06/29/2013   Procedure: ENDARTERECTOMY CAROTID-RIGHT, WITH DACRON PATCH ANGIOPLASTY;  Surgeon: Mal Misty, MD;  Location: [facility];  Service: Vascular;  Laterality: Right;  . ENDOVEIN HARVEST OF GREATER SAPHENOUS VEIN Left 02/01/2020   Procedure: ENDOVEIN HARVEST OF GREATER SAPHENOUS VEIN;  Surgeon: Lajuana Matte, MD;  Location: [place];  Service: Open Heart Surgery;  Laterality: Left;  . LAPAROSCOPIC ASSISTED VAGINAL HYSTERECTOMY N/A 10/22/2014   Procedure: LAPAROSCOPIC ASSISTED VAGINAL HYSTERECTOMY;  Surgeon: Marylynn Pearson, MD;  Location: [facility];  Service: Gynecology;  Laterality: N/A;  Pam from Dr. Mikle Bosworth office will call and add additional surgery for  this patient in our block time.  Marland Kitchen LEFT HEART CATH AND CORONARY ANGIOGRAPHY N/A 01/26/2020   Procedure: LEFT HEART CATH AND CORONARY ANGIOGRAPHY;  Surgeon: [name], MD;  Location: [facility];  Service: Cardiovascular;  Laterality: N/A;  . MELANOMA EXCISION  12/1980   left leg,  . PARTIAL MASTECTOMY WITH NEEDLE LOCALIZATION Left 08/22/2013   Procedure: PARTIAL MASTECTOMY WITH NEEDLE LOCALIZATION;  Surgeon: Stark Klein, MD;  Location: [place] [facility];  Service: General;  Laterality: Left;  . SALPINGOOPHORECTOMY Bilateral 10/22/2014   Procedure: SALPINGO OOPHORECTOMY;  Surgeon: Marylynn Pearson, MD;  Location: [facility];  Service: Gynecology;  Laterality: Bilateral;  . 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 TEE WITHOUT CARDIOVERSION N/A 02/01/2020   Procedure: TRANSESOPHAGEAL ECHOCARDIOGRAM (TEE);  Surgeon: Lajuana Matte, MD;  Location: [place];  Service: Open Heart Surgery;  Laterality: N/A;  . TUBAL LIGATION    . VAGINAL PROLAPSE REPAIR N/A 10/22/2014   Procedure: VAULT PROLAPSE WITH GRAFT;  Surgeon: Bjorn Loser, MD;  Location: [facility];  Service: Urology;  Laterality: N/A;  . WISDOM TOOTH EXTRACTION       Current Outpatient Medications  Medication Sig Dispense Refill  . amitriptyline (ELAVIL) 25 MG tablet TAKE 1 TABLET BY MOUTH EVERYDAY AT BEDTIME 90 tablet 2  . amLODipine (NORVASC) 5 MG tablet 1 tablet    . aspirin EC 325 MG EC tablet Take 1 tablet (325 mg total) by mouth daily.    . Blood Glucose Monitoring Suppl (FREESTYLE INSULINX SYSTEM) w/Device KIT See admin instructions.    . canagliflozin (INVOKANA) 300 MG TABS tablet Take 300 mg by mouth daily.     . famotidine (PEPCID) 40 MG tablet 1 tablet    . Multiple Vitamins-Minerals (PRESERVISION AREDS 2 PO) Take 1 tablet by mouth 2 (two) times daily.    . nebivolol (BYSTOLIC) 5 MG tablet Take 5 mg by mouth daily.    . Nebivolol HCl (BYSTOLIC) 20 MG TABS Take 1 tablet by mouth daily.    . Omega-3 Fatty Acids (FISH OIL)  1000 MG CAPS Take 1,000 mg by mouth 2 (two) times daily.    . pantoprazole (PROTONIX) 40 MG tablet Take 40 mg by mouth daily.    . Pitavastatin Calcium (LIVALO) 2 MG TABS Take 2 mg by mouth 2 (two) times a week.    . pregabalin (LYRICA) 25 MG capsule Take 1 capsule (25 mg total) by mouth 2 (two) times daily. 60 capsule 3  . Saxagliptin-Metformin 2.11-998 MG TB24 Take 1 tablet by mouth 2 (two) times daily. Reported on 08/21/2015    . VITAMIN D PO Take 1,000 Units by mouth daily.    . vitamin E 1000 UNIT capsule Take 1,000 Units by mouth daily.     No current facility-administered medications for this visit.    Allergies:   Codeine, Ace inhibitors,  Clarithromycin, Crestor [rosuvastatin], Hydroxychloroquine sulfate, Iodine, Lipitor [atorvastatin], Other, and Avandamet [rosiglitazone-metformin]    Social History:  The patient  reports that she quit smoking about 10 years ago. Her smoking use included cigarettes. She has a 0.75 pack-year smoking history. She has never used smokeless tobacco. She reports that she does not drink alcohol and does not use drugs.   Family History:  The patient's family history includes Asthma in her father; Cancer in her father and mother; Diabetes in her father; Healthy in her daughter, daughter, and son; Heart attack (age of onset: 53) in her sister; Heart attack (age of onset: 37) in her father; Heart attack (age of onset: 61) in her mother; Heart disease in her father, mother, and sister; Hypertension in her father and mother; Kidney disease in her father; Stroke in her mother; Varicose Veins in her mother.    ROS: All other systems are reviewed and negative. Unless otherwise mentioned in H&P    PHYSICAL EXAM: VS:  BP 128/86   Pulse 93   Ht _0  (1.549 m)   Wt 120 lb (54.4 kg)   SpO2 98%   BMI 22.67 kg/m  , BMI Body mass index is 22.67 kg/m. GEN: Well nourished, well developed, in no acute distress HEENT: normal Neck: no JVD, carotid bruits, or  masses Cardiac:RRR; slightly tachycardic, apical pulse 84, no murmurs, rubs, or gallops,no edema  Respiratory:  Clear to auscultation bilaterally, normal work of breathing GI: soft, nontender, nondistended, + BS MS: no deformity or atrophy Skin: warm and dry, no rash Neuro:  Strength and sensation are intact Psych: euthymic mood, full affect   EKG: Sinus tachycardia heart rate of 93 bpm with left atrial enlargement, septal and lateral changes indicative of infarct undetermined.  (Personally reviewed)  Recent Labs: 12/27/2019: BNP 33.4; TSH 1.870 02/02/2020: Magnesium 2.6 05/29/2020: ALT 13; BUN 16; Creatinine 0.99; Hemoglobin 16.0; Platelet Count 247; Potassium 4.7; Sodium 140    Lipid Panel    Component Value Date/Time   CHOL 186 01/27/2020 0149   TRIG 374 (H) 01/27/2020 0149   HDL 33 (L) 01/27/2020 0149   CHOLHDL 5.6 01/27/2020 0149   VLDL 75 (H) 01/27/2020 0149   LDLCALC 78 01/27/2020 0149      Wt Readings from Last 3 Encounters:  08/23/20 120 lb (54.4 kg)  07/31/20 119 lb 11.2 oz (54.3 kg)  07/26/20 120 lb (54.4 kg)      Other studies Reviewed: [facility] : 1. Severe three-vessel coronary artery disease, as outlined below. 2. Normal left ventricular systolic function and filling pressure.  Recommendations: 1. Admit for cardiac surgery consultation for CABG (case discussed with Dr. Kipp Brood). 2. Medical therapy of chest pain, which is likely a combination of angina and non-cardiac chest pain. 3. Aggressive secondary prevention. 4. Obtain transthoracic echocardiogram  Echo 01/27/2020 1. Left ventricular ejection fraction, by estimation, is 60 to 65%. The  left ventricle has normal function. The left ventricle has no regional  wall motion abnormalities. Left ventricular diastolic parameters were  normal.  2. Right ventricular systolic function is normal. The right ventricular  size is normal. There is normal pulmonary artery systolic pressure.  3. The mitral valve is  normal in structure. Trivial mitral valve  regurgitation. No evidence of mitral stenosis.  4. The aortic valve is tricuspid. Aortic valve regurgitation is not  visualized. Mild aortic valve sclerosis is present, with no evidence of  aortic valve stenosis.  5. The inferior vena cava is normal in size with greater  than 50%  respiratory variability, suggesting right atrial pressure of 3 mmHg.   ASSESSMENT AND PLAN:  1.  Coronary artery disease: Status post three-vessel, coronary artery bypass grafting, on 02/01/2020.  She continues to have sequela from this concerning chest discomfort with breathing.  CT scan completed by CVTS surgeon was negative for infection but did show mild emphysema.  She has significant amount of arthritis in her shoulders and hands.  She does use diclofenac cream for this.  I have suggested that she continue her Lyrica and try the topical diclofenac cream to assist with her discomfort.  She is slightly tachycardic.  This may be contributing to shortness of breath as well.  She is on Bystolic 25 mg daily.  This was added by primary care for better blood pressure control.  May need to consider adjusting beta-blocker therapy for better heart rate which may help with breathing status, and adjust amlodipine for blood pressure control.  We will readdress this once we see her again in 6 months.  She knows to call us sooner if her symptoms worsen.  2.  Hypertension: Currently well controlled on Bystolic and amlodipine.  Labs are being followed by PCP.  3.  Mixed hyperlipidemia: LDL is 78.  She is allergic to statin therapy causing significant body aches and pain.  Currently she is controlled fairly well, with goal of LDL however to be less than 70.  Current medicines are reviewed at length with the patient today.  I have spent 25 mins dedicated to the care of this patient on the date of this encounter to include pre-visit review of records, assessment, management and diagnostic  testing,with shared decision making.  Labs/ tests ordered today include: None   Phill Myron. West Pugh, ANP, [facility]   08/23/2020 10:43 AM    [facility] [address] Office [phone] Fax [phone]  Notice: This dictation was prepared with Dragon dictation along with smaller phrase technology. Any transcriptional errors that result from this process are unintentional and may not be corrected upon review.

## 2020-08-23 ENCOUNTER — Other Ambulatory Visit: Payer: Self-pay

## 2020-08-23 ENCOUNTER — Encounter: Payer: Self-pay | Admitting: Adult Health

## 2020-08-23 ENCOUNTER — Ambulatory Visit (INDEPENDENT_AMBULATORY_CARE_PROVIDER_SITE_OTHER): Payer: Medicare Other | Admitting: Adult Health

## 2020-08-23 VITALS — BP 128/86 | HR 93 | Ht 61.0 in | Wt 120.0 lb

## 2020-08-23 DIAGNOSIS — I10 Essential (primary) hypertension: Secondary | ICD-10-CM | POA: Diagnosis not present

## 2020-08-23 DIAGNOSIS — E782 Mixed hyperlipidemia: Secondary | ICD-10-CM | POA: Diagnosis not present

## 2020-08-23 DIAGNOSIS — I200 Unstable angina: Secondary | ICD-10-CM | POA: Diagnosis not present

## 2020-08-23 DIAGNOSIS — I2510 Atherosclerotic heart disease of native coronary artery without angina pectoris: Secondary | ICD-10-CM | POA: Diagnosis not present

## 2020-08-23 NOTE — Patient Instructions (Signed)
Medication Instructions:  °Continue current medications ° °*If you need a refill on your cardiac medications before your next appointment, please call your pharmacy* ° ° °Lab Work: °None Ordered ° ° °Testing/Procedures: °None ordered ° ° °Follow-Up: °At [facility], you and your health needs are our priority.  As part of our continuing mission to provide you with exceptional heart care, we have created designated Provider Care Teams.  These Care Teams include your primary Cardiologist (physician) and Advanced Practice Providers (APPs -  Physician Assistants and Nurse Practitioners) who all work together to provide you with the care you need, when you need it. ° °We recommend signing up for the patient portal called "MyChart".  Sign up information is provided on this After Visit Summary.  MyChart is used to connect with patients for Virtual Visits (Telemedicine).  Patients are able to view lab/test results, encounter notes, upcoming appointments, etc.  Non-urgent messages can be sent to your provider as well.   °To learn more about what you can do with MyChart, go to [email or web address].   ° °Your next appointment:   °6 month(s) ° °The format for your next appointment:   °In Person ° °Provider:   °You may see James Hochrein, MD or one of the following Advanced Practice Providers on your designated Care Team:   °· Rhonda Barrett, PA-C °· Kathryn Lawrence, DNP, ANP ° ° ° ° ° °

## 2020-08-30 DIAGNOSIS — Z6822 Body mass index (BMI) 22.0-22.9, adult: Secondary | ICD-10-CM | POA: Diagnosis not present

## 2020-08-30 DIAGNOSIS — Z124 Encounter for screening for malignant neoplasm of cervix: Secondary | ICD-10-CM | POA: Diagnosis not present

## 2020-08-30 DIAGNOSIS — N958 Other specified menopausal and perimenopausal disorders: Secondary | ICD-10-CM | POA: Diagnosis not present

## 2020-08-30 DIAGNOSIS — M8588 Other specified disorders of bone density and structure, other site: Secondary | ICD-10-CM | POA: Diagnosis not present

## 2020-09-27 DIAGNOSIS — M8580 Other specified disorders of bone density and structure, unspecified site: Secondary | ICD-10-CM | POA: Diagnosis not present

## 2020-10-10 DIAGNOSIS — D225 Melanocytic nevi of trunk: Secondary | ICD-10-CM | POA: Diagnosis not present

## 2020-10-10 DIAGNOSIS — D485 Neoplasm of uncertain behavior of skin: Secondary | ICD-10-CM | POA: Diagnosis not present

## 2020-10-10 DIAGNOSIS — L821 Other seborrheic keratosis: Secondary | ICD-10-CM | POA: Diagnosis not present

## 2020-10-10 DIAGNOSIS — L814 Other melanin hyperpigmentation: Secondary | ICD-10-CM | POA: Diagnosis not present

## 2020-10-10 DIAGNOSIS — D2122 Benign neoplasm of connective and other soft tissue of left lower limb, including hip: Secondary | ICD-10-CM | POA: Diagnosis not present

## 2020-10-10 DIAGNOSIS — D2372 Other benign neoplasm of skin of left lower limb, including hip: Secondary | ICD-10-CM | POA: Diagnosis not present

## 2020-10-10 DIAGNOSIS — Z85828 Personal history of other malignant neoplasm of skin: Secondary | ICD-10-CM | POA: Diagnosis not present

## 2020-10-10 DIAGNOSIS — L661 Lichen planopilaris: Secondary | ICD-10-CM | POA: Diagnosis not present

## 2020-10-10 DIAGNOSIS — L570 Actinic keratosis: Secondary | ICD-10-CM | POA: Diagnosis not present

## 2020-10-10 DIAGNOSIS — L578 Other skin changes due to chronic exposure to nonionizing radiation: Secondary | ICD-10-CM | POA: Diagnosis not present

## 2020-10-10 DIAGNOSIS — Z85820 Personal history of malignant melanoma of skin: Secondary | ICD-10-CM | POA: Diagnosis not present

## 2020-10-10 DIAGNOSIS — Z86018 Personal history of other benign neoplasm: Secondary | ICD-10-CM | POA: Diagnosis not present

## 2020-10-16 DIAGNOSIS — M25512 Pain in left shoulder: Secondary | ICD-10-CM | POA: Diagnosis not present

## 2020-10-16 DIAGNOSIS — M542 Cervicalgia: Secondary | ICD-10-CM | POA: Diagnosis not present

## 2020-10-31 DIAGNOSIS — M25512 Pain in left shoulder: Secondary | ICD-10-CM | POA: Diagnosis not present

## 2020-11-04 DIAGNOSIS — M25512 Pain in left shoulder: Secondary | ICD-10-CM | POA: Diagnosis not present

## 2020-11-05 ENCOUNTER — Telehealth: Payer: Self-pay | Admitting: Cardiology

## 2020-11-05 NOTE — Telephone Encounter (Signed)
Called the pt to schedule a pre op appt for clearance. First appt that I have available is 11/26/20 with Dr. Percival Spanish. I have taken the liberty to schedule the pt as the schedules are booking up so very quickly. Dr. Percival Spanish 11/26/20 @ 4 pm. Left message for the pt to please call the office and confirm the appt or change the appt if needed.

## 2020-11-05 NOTE — Telephone Encounter (Signed)
   Name: Faith Gaines  DOB: 06/30/1946  MRN: [id number]  Primary Cardiologist: Minus Breeding, MD  Chart reviewed as part of pre-operative protocol coverage. Because of Faith Gaines's past medical history and time since last visit, she will require a follow-up visit in order to better assess preoperative cardiovascular risk.  Pre-op covering staff: - Please schedule appointment and call patient to inform them. If patient already had an upcoming appointment within acceptable timeframe, please add "pre-op clearance" to the appointment notes so provider is aware. - Please contact requesting surgeon's office via preferred method (i.e, phone, fax) to inform them of need for appointment prior to surgery.  If applicable, this message will also be routed to pharmacy pool and/or primary cardiologist for input on holding anticoagulant/antiplatelet agent as requested below so that this information is available to the clearing provider at time of patient's appointment.   Patient continues to have intermittent chest pain. I do not feel comfortable clearing the patient over the phone. I would recommend an office visit for preoperative clearance.  Patient says her shoulder pain is quite significant, I would recommend a sooner office visit within the next 2 weeks for preop clearance.  [place], [place]  11/05/2020, 3:41 PM

## 2020-11-05 NOTE — Telephone Encounter (Signed)
   [facility] Pre-operative Risk Assessment    Patient Name: Faith Gaines  DOB: 1945-10-30  MRN: [id number]   Request for surgical clearance:  1. What type of surgery is being performed? Left shoulder scope   2. When is this surgery scheduled? TBD   3. What type of clearance is required (medical clearance vs. Pharmacy clearance to hold med vs. Both)? Both   4. Are there any medications that need to be held prior to surgery and how long? None specified - on ASA 330m   5. Practice name and name of physician performing surgery? [facility]  6. What is the office phone number? [phone]  7.   What is the office fax number? [phone](attn: Sherri)  8.   Anesthesia type (None, local, MAC, general) ? Not specified    EFidel Levy4/19/2022, 12:16 PM  _________________________________________________________________   (provider comments below)

## 2020-11-06 NOTE — Telephone Encounter (Signed)
I s/w the pt this morning to confirm appt. Pt said she s/w someone yesterday and she did confirm the appt with Dr. Percival Spanish 11/26/20 for pre op clearance. Pt states the operator also added her to the wait list for possible sooner appt. I did review [facility] and [facility] schedules again while on the phone with the pt and there still was nothing sooner in either office. I assured the pt that it is not our goal to prolong them from having their surgery and prolonging their pain. I assured her that we will do our best to get her in sooner if any openings come up. Pt said she can come in any day and any time. I will forward clearance notes to MD for upcoming appt. Will send FYI to requesting office pt has appt 11/26/20. Pt is grateful for all the help that our office is giving her to help with her upcoming surgery.

## 2020-11-08 NOTE — Telephone Encounter (Signed)
Called pt and rescheduled appointment to 4/25 at 8 am per Dr. Percival Spanish request. Pt verbalized understanding.

## 2020-11-09 NOTE — Progress Notes (Signed)
Cardiology Office Note   Date:  11/11/2020   ID:  Faith Gaines, DOB 1946/04/14, MRN [id number]  PCP:  Maury Dus, MD  Cardiologist:   Minus Breeding, MD  Chief Complaint  Patient presents with  . Pre-op Exam           History of Present Illness: Faith Gaines is a 75 y.o. female who is referred for evaluation of CAD/CABG .  She is here for a preop evaluation prior to left shoulder surgery.  When she called for clearance she mentioned ongoing chest pain.    She presents and she is describing the same kind of incisional pain she was having before.  The tenderness over the scar.  There is a knot at the caudal portion of the sternum.  She has some tenderness across her chest.  Some of this did get better previously with Lyrica but she has been out of this.  She did have a CT in January without any acute abnormalities noted.  She is to have shoulder arthroscopy.  She does have persistent shortness of breath sometimes with exertion but she is doing yard work and she walking for exercise.  This is not any worse than previous and she is not describing resting shortness of breath, PND or orthopnea.  She is not having any palpitations, presyncope or syncope.  Of note the CT did demonstrate some mild emphysematous changes previously.   Past Medical History:  Diagnosis Date  . Anxiety    no meds  . Breast cancer ([place]) 2015   left breast   . CKD (chronic kidney disease), stage III (HCC)    no meds, blood draws q 48mo [place] [facility] . Depression    no meds  . Diabetes mellitus without complication ([place]    type 2  . GERD (gastroesophageal reflux disease)    occ  . Hyperlipidemia    diet controlled, no meds  . Hypertension   . Iron deficiency anemia   . Osteopenia   . Peripheral vascular disease ([place]    rt carotid stenosis  . Pneumonia 12/2011, 05/2012   hx x2 in 2013  . PONV (postoperative nausea and vomiting)   . Trigger finger     resolved with cortisone injections    Past Surgical History:  Procedure Laterality Date  . ANTERIOR AND POSTERIOR REPAIR N/A 10/22/2014   Procedure: CYSTO, REPAIR CYSTOCELE/RECTOCELE;  Surgeon: SBjorn Loser MD;  [facility];  Service: Urology;  Laterality: N/A;  . APPENDECTOMY    . BLADDER SURGERY  05/1981   bladder tack  . BREAST LUMPECTOMY Left 2015  . CHOLECYSTECTOMY  1995  . COLONOSCOPY    . CORONARY ARTERY BYPASS GRAFT N/A 02/01/2020   Procedure: CORONARY ARTERY BYPASS GRAFTING (CABG) using LIMA to LAD; Endovein Harvested Greater Saphenous Vein: SVG to Diag1; SVG to OM1.;  Surgeon: LLajuana Matte MD;  [place]  Service: Open Heart Surgery;  Laterality: N/A;  . ENDARTERECTOMY Right 06/29/2013   Procedure: ENDARTERECTOMY CAROTID-RIGHT, WITH DACRON PATCH ANGIOPLASTY;  Surgeon: JMal Misty MD;  [facility];  Service: Vascular;  Laterality: Right;  . ENDOVEIN HARVEST OF GREATER SAPHENOUS VEIN Left 02/01/2020   Procedure: ENDOVEIN HARVEST OF GREATER SAPHENOUS VEIN;  Surgeon: LLajuana Matte MD;  [place] [facility]  Service: Open Heart Surgery;  Laterality: Left;  . LAPAROSCOPIC ASSISTED VAGINAL HYSTERECTOMY N/A 10/22/2014   Procedure: LAPAROSCOPIC ASSISTED VAGINAL HYSTERECTOMY;  Surgeon: GMarylynn Pearson  MD;  Location: [facility] [facility];  Service: Gynecology;  Laterality: N/A;  Pam from Dr. Mikle Bosworth office will call and add additional surgery for this patient in our block time.  Marland Kitchen LEFT HEART CATH AND CORONARY ANGIOGRAPHY N/A 01/26/2020   Procedure: LEFT HEART CATH AND CORONARY ANGIOGRAPHY;  Surgeon: [name], MD;  Location: [place] [facility];  Service: Cardiovascular;  Laterality: N/A;  . MELANOMA EXCISION  12/1980   left leg,  . PARTIAL MASTECTOMY WITH NEEDLE LOCALIZATION Left 08/22/2013   Procedure: PARTIAL MASTECTOMY WITH NEEDLE LOCALIZATION;  Surgeon: Stark Klein, MD;  Location: [place];  Service: General;  Laterality: Left;  . SALPINGOOPHORECTOMY Bilateral  10/22/2014   Procedure: SALPINGO OOPHORECTOMY;  Surgeon: Marylynn Pearson, MD;  Location: [facility];  Service: Gynecology;  Laterality: Bilateral;  . 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 TEE WITHOUT CARDIOVERSION N/A 02/01/2020   Procedure: TRANSESOPHAGEAL ECHOCARDIOGRAM (TEE);  Surgeon: Lajuana Matte, MD;  Location: [place];  Service: Open Heart Surgery;  Laterality: N/A;  . TUBAL LIGATION    . VAGINAL PROLAPSE REPAIR N/A 10/22/2014   Procedure: VAULT PROLAPSE WITH GRAFT;  Surgeon: Bjorn Loser, MD;  Location: [facility] [place] [facility];  Service: Urology;  Laterality: N/A;  . WISDOM TOOTH EXTRACTION       Current Outpatient Medications  Medication Sig Dispense Refill  . amLODipine (NORVASC) 5 MG tablet 1 tablet    . Blood Glucose Monitoring Suppl (FREESTYLE INSULINX SYSTEM) w/Device KIT See admin instructions.    . canagliflozin (INVOKANA) 300 MG TABS tablet Take 300 mg by mouth daily.     . famotidine (PEPCID) 40 MG tablet 1 tablet    . Multiple Vitamins-Minerals (PRESERVISION AREDS 2 PO) Take 1 tablet by mouth 2 (two) times daily.    . nebivolol (BYSTOLIC) 5 MG tablet Take 5 mg by mouth daily.    . Nebivolol HCl (BYSTOLIC) 20 MG TABS Take 1 tablet by mouth daily.    . Omega-3 Fatty Acids (FISH OIL) 1000 MG CAPS Take 1,000 mg by mouth 2 (two) times daily.    . pantoprazole (PROTONIX) 40 MG tablet Take 40 mg by mouth daily.    . Pitavastatin Calcium (LIVALO) 2 MG TABS Take 2 mg by mouth 2 (two) times a week.    Marland Kitchen VITAMIN D PO Take 1,000 Units by mouth daily.    . vitamin E 1000 UNIT capsule Take 1,000 Units by mouth daily.    Marland Kitchen amitriptyline (ELAVIL) 25 MG tablet TAKE 1 TABLET BY MOUTH EVERYDAY AT BEDTIME (Patient not taking: Reported on 11/11/2020) 90 tablet 2  . aspirin 81 MG tablet Take 1 tablet (81 mg total) by mouth daily.    . pregabalin (LYRICA) 25 MG capsule Take 1 capsule (25 mg total) by mouth 2 (two) times daily. 180 capsule 3  . Saxagliptin-Metformin 2.11-998 MG TB24 Take 1  tablet by mouth 2 (two) times daily. Reported on 08/21/2015     No current facility-administered medications for this visit.    Allergies:   Codeine, Ace inhibitors, Clarithromycin, Crestor [rosuvastatin], Hydroxychloroquine sulfate, Iodine, Lipitor [atorvastatin], Other, and Avandamet [rosiglitazone-metformin]    ROS:  Please see the history of present illness.   Otherwise, review of systems are positive for none.   All other systems are reviewed and negative.    PHYSICAL EXAM: VS:  BP (!) 155/80   Pulse 70   Ht 5' 1"  (1.549 m)   Wt 118 lb 9.6 oz (53.8 kg)   BMI 22.41 kg/m  ,  BMI Body mass index is 22.41 kg/m. GENERAL:  Well appearing NECK:  No jugular venous distention, waveform within normal limits, carotid upstroke brisk and symmetric, no bruits, no thyromegaly LUNGS:  Clear to auscultation bilaterally CHEST:  Well healed sternotomy scar. HEART:  PMI not displaced or sustained,S1 and S2 within normal limits, no S3, no S4, no clicks, no rubs, no murmurs ABD:  Flat, positive bowel sounds normal in frequency in pitch, no bruits, no rebound, no guarding, no midline pulsatile mass, no hepatomegaly, no splenomegaly EXT:  2 plus pulses throughout, no edema, no cyanosis no clubbing   EKG:  EKG is  ordered today.  Sinus rhythm, rate 70, left axis deviation, left anterior fascicular block, poor anterior R wave progression, no acute ST-T wave changes.  Recent Labs: 12/27/2019: BNP 33.4; TSH 1.870 02/02/2020: Magnesium 2.6 05/29/2020: ALT 13; BUN 16; Creatinine 0.99; Hemoglobin 16.0; Platelet Count 247; Potassium 4.7; Sodium 140    Lipid Panel    Component Value Date/Time   CHOL 186 01/27/2020 0149   TRIG 374 (H) 01/27/2020 0149   HDL 33 (L) 01/27/2020 0149   CHOLHDL 5.6 01/27/2020 0149   VLDL 75 (H) 01/27/2020 0149   LDLCALC 78 01/27/2020 0149      Wt Readings from Last 3 Encounters:  11/11/20 118 lb 9.6 oz (53.8 kg)  08/23/20 120 lb (54.4 kg)  07/31/20 119 lb 11.2 oz (54.3  kg)      Other studies Reviewed: Additional studies/ records that were reviewed today include: Labs Review of the above records demonstrates: See elsewher filee   ASSESSMENT AND PLAN:   PREOP:  The patient is now presenting for a high risk procedure.  She has been for high risk physical or historical findings.  She has a high functional level.  Therefore, according to ACC/AHA guidelines the patient is at acceptable risk for the planned surgery without any further testing.      CAD/CABG:      The patient has no new sypmtoms.  No further cardiovascular testing is indicated.  We will continue with aggressive risk reduction.  She can be reduced to 81 mg of aspirin daily.   CHEST PAIN: This seems to be incisional pain.  I am going to renew Lyrica 25 mg twice daily.  DM: A1c was 6.4.  No change in therapy.   DYSLIPIDEMIA:   LDL was up to 125.  She has not tolerated more statin than what she is currently on.  She agrees to consider PCSK9 inhibitor and I will refer her to our Pharm.[facility]. Lipid [facility].    Current medicines are reviewed at length with the patient today.  The patient does not have concerns regarding medicines.  The following changes have been made:  None  Labs/ tests ordered today include: None  Orders Placed This Encounter  Procedures  . EKG 12-Lead     Disposition:   FU with APP in 4 months.      Signed, Minus Breeding, MD  11/11/2020 8:59 AM    [facility]

## 2020-11-11 ENCOUNTER — Other Ambulatory Visit: Payer: Self-pay

## 2020-11-11 ENCOUNTER — Encounter: Payer: Self-pay | Admitting: Cardiology

## 2020-11-11 ENCOUNTER — Ambulatory Visit (INDEPENDENT_AMBULATORY_CARE_PROVIDER_SITE_OTHER): Payer: Medicare Other | Admitting: Cardiology

## 2020-11-11 VITALS — BP 155/80 | HR 70 | Ht 61.0 in | Wt 118.6 lb

## 2020-11-11 DIAGNOSIS — I200 Unstable angina: Secondary | ICD-10-CM | POA: Diagnosis not present

## 2020-11-11 DIAGNOSIS — Z01810 Encounter for preprocedural cardiovascular examination: Secondary | ICD-10-CM

## 2020-11-11 DIAGNOSIS — R072 Precordial pain: Secondary | ICD-10-CM | POA: Diagnosis not present

## 2020-11-11 MED ORDER — PREGABALIN 25 MG PO CAPS: 25.0000 mg | ORAL_CAPSULE | Freq: Two times a day (BID) | ORAL | 3 refills | Status: DC

## 2020-11-11 MED ORDER — ASPIRIN EC 81 MG PO TBEC: 81.0000 mg | DELAYED_RELEASE_TABLET | Freq: Every day | ORAL | Status: DC

## 2020-11-11 NOTE — Patient Instructions (Addendum)
Medication Instructions:   DECREASE  Aspirin 81 mg daily  *If you need a refill on your cardiac medications before your next appointment, please call your pharmacy*  Lab Work: NONE ordered at this time of appointment   If you have labs (blood work) drawn today and your tests are completely normal, you will receive your results only by: Marland Kitchen MyChart Message (if you have MyChart) OR . A paper copy in the mail If you have any lab test that is abnormal or we need to change your treatment, we will call you to review the results.  Testing/Procedures: NONE ordered at this time of appointment   Follow-Up: At [facility], you and your health needs are our priority.  As part of our continuing mission to provide you with exceptional heart care, we have created designated Provider Care Teams.  These Care Teams include your primary Cardiologist (physician) and Advanced Practice Providers (APPs -  Physician Assistants and Nurse Practitioners) who all work together to provide you with the care you need, when you need it.   Your next appointment:   1 available  6 month(s)  The format for your next appointment:   In Person In Person  Provider:   Pharm D Minus Breeding, MD  Other Instructions  Dr. Percival Spanish would like for you to see the Pharmacist about Repatha

## 2020-11-20 NOTE — H&P (Signed)
PREOPERATIVE H&P  Chief Complaint: LEFT SHOULDER BICIPITAL TENDONITIS  HPI: Faith Gaines is a 75 y.o. female who presents with a diagnosis of LEFT SHOULDER BICIPITAL TENDONITIS. She says symptoms started about 9 months ago around the time she had open heart surgery. The pain radiates from the neck down to the left elbow at times. She also has periscapular pain. Symptoms are rated as moderate to severe, and have been worsening.  This is significantly impairing activities of daily living.  She has elected for surgical management.   Past Medical History:  Diagnosis Date  . Anxiety    no meds  . Breast cancer ([place]) 2015   left breast   . CKD (chronic kidney disease), stage III (HCC)    no meds, blood draws q 30mo [place] . Depression    no meds  . Diabetes mellitus without complication ([place]    type 2  . GERD (gastroesophageal reflux disease)    occ  . Hyperlipidemia    diet controlled, no meds  . Hypertension   . Iron deficiency anemia   . Osteopenia   . Peripheral vascular disease ([facility]    rt carotid stenosis  . Pneumonia 12/2011, 05/2012   hx x2 in 2013  . PONV (postoperative nausea and vomiting)   . Trigger finger    resolved with cortisone injections   Past Surgical History:  Procedure Laterality Date  . ANTERIOR AND POSTERIOR REPAIR N/A 10/22/2014   Procedure: CYSTO, REPAIR CYSTOCELE/RECTOCELE;  Surgeon: SBjorn Loser MD;  [facility];  Service: Urology;  Laterality: N/A;  . APPENDECTOMY    . BLADDER SURGERY  05/1981   bladder tack  . BREAST LUMPECTOMY Left 2015  . CHOLECYSTECTOMY  1995  . COLONOSCOPY    . CORONARY ARTERY BYPASS GRAFT N/A 02/01/2020   Procedure: CORONARY ARTERY BYPASS GRAFTING (CABG) using LIMA to LAD; Endovein Harvested Greater Saphenous Vein: SVG to Diag1; SVG to OM1.;  Surgeon: LLajuana Matte MD;  [place]  Service: Open Heart Surgery;  Laterality: N/A;  . ENDARTERECTOMY Right 06/29/2013    Procedure: ENDARTERECTOMY CAROTID-RIGHT, WITH DACRON PATCH ANGIOPLASTY;  Surgeon: JMal Misty MD;  [facility]  Service: Vascular;  Laterality: Right;  . ENDOVEIN HARVEST OF GREATER SAPHENOUS VEIN Left 02/01/2020   Procedure: ENDOVEIN HARVEST OF GREATER SAPHENOUS VEIN;  Surgeon: LLajuana Matte MD;  [place]  Service: Open Heart Surgery;  Laterality: Left;  . LAPAROSCOPIC ASSISTED VAGINAL HYSTERECTOMY N/A 10/22/2014   Procedure: LAPAROSCOPIC ASSISTED VAGINAL HYSTERECTOMY;  Surgeon: GMarylynn Pearson MD;  [facility];  Service: Gynecology;  Laterality: N/A;  Pam from Dr. MM[facility]worthoffice will call and add additional surgery for this patient in our block time.  .Marland KitchenLEFT HEART CATH AND CORONARY ANGIOGRAPHY N/A 01/26/2020   Procedure: LEFT HEART CATH AND CORONARY ANGIOGRAPHY;  Surgeon: ENelva Bush MD;  [facility];  Service: Cardiovascular;  Laterality: N/A;  . MELANOMA EXCISION  12/1980   left leg,  . PARTIAL MASTECTOMY WITH NEEDLE LOCALIZATION Left 08/22/2013   Procedure: PARTIAL MASTECTOMY WITH NEEDLE LOCALIZATION;  Surgeon: FStark Klein MD;  [place]  Service: General;  Laterality: Left;  . SALPINGOOPHORECTOMY Bilateral 10/22/2014   Procedure: SALPINGO OOPHORECTOMY;  Surgeon: GMarylynn Pearson MD;  [place];  Service: Gynecology;  Laterality: Bilateral;  . 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 TEE WITHOUT CARDIOVERSION N/A 02/01/2020   Procedure: TRANSESOPHAGEAL ECHOCARDIOGRAM (  TEE);  Surgeon: Lajuana Matte, MD;  Location: [place];  Service: Open Heart Surgery;  Laterality: N/A;  . TUBAL LIGATION    . VAGINAL PROLAPSE REPAIR N/A 10/22/2014   Procedure: VAULT PROLAPSE WITH GRAFT;  Surgeon: Bjorn Loser, MD;  Location: [place] [facility];  Service: Urology;  Laterality: N/A;  . WISDOM TOOTH EXTRACTION     Social History   Socioeconomic History  . Marital status: Widowed    Spouse name: Not on file  . Number of children: Not  on file  . Years of education: Not on file  . 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 Smoking status: Former Smoker    Packs/day: 0.25    Years: 3.00    Pack years: 0.75    Types: Cigarettes    Quit date: 07/20/2010    Years since quitting: 10.3  . Smokeless tobacco: Never Used  Vaping Use  . Vaping Use: Never used  Substance and Sexual Activity  . Alcohol use: No    Alcohol/week: 0.0 standard drinks  . Drug use: No  . Sexual activity: Yes    Birth control/protection: Post-menopausal  Other Topics Concern  . Not on file  Social History Narrative   Widow.  Lives alone.  Three children and 9 grands.     Social Determinants of Health   Financial Resource Strain: Not on file  Food Insecurity: Not on file  Transportation Needs: Not on file  Physical Activity: Not on file  Stress: Not on file  Social Connections: Not on file   Family History  Problem Relation Age of Onset  . Cancer Mother        breast, uterus, lung  . Heart attack Mother 16  . Stroke Mother   . Heart disease Mother        Open Heart Surgery  . Hypertension Mother   . Varicose Veins Mother   . Heart disease Father   . Heart attack Father 55       Died of MI age 62  . Kidney disease Father   . Hypertension Father   . Diabetes Father   . Cancer Father   . Asthma Father   . Heart attack Sister 50       ICD  . Heart disease Sister   . Healthy Daughter   . Healthy Daughter   . Healthy Son    Allergies  Allergen Reactions  . Codeine Nausea And Vomiting and Other (See Comments)    Headache and vomiting  . Ace Inhibitors     Drop in GFR  . Clarithromycin     Unable to sleep   . Crestor [Rosuvastatin]     Body aches  . Hydroxychloroquine Sulfate Other (See Comments)  . Iodine     Other reaction(s): fungus?  . Lipitor [Atorvastatin]     Body aches  . Avandamet [Rosiglitazone-Metformin] Rash and  Other (See Comments)    Makes her hyper   Prior to Admission medications   Medication Sig Start Date End Date Taking? Authorizing Provider  amitriptyline (ELAVIL) 25 MG tablet TAKE 1 TABLET BY MOUTH EVERYDAY AT BEDTIME Patient not taking: Reported on 11/11/2020 06/20/20 09/18/20  Derwood Kaplan, MD  amLODipine (NORVASC) 5 MG tablet 1 tablet    [provider]  aspirin 81 MG tablet Take 1 tablet (81 mg total) by mouth daily. 11/11/20  Minus Breeding, MD  Blood Glucose Monitoring Suppl (Calcium) w/Device KIT See admin instructions. 11/17/16   [provider]  canagliflozin (INVOKANA) 300 MG TABS tablet Take 300 mg by mouth daily.     [provider]  famotidine (PEPCID) 40 MG tablet 1 tablet    [provider]  Multiple Vitamins-Minerals (PRESERVISION AREDS 2 PO) Take 1 tablet by mouth 2 (two) times daily.    [provider]  nebivolol (BYSTOLIC) 5 MG tablet Take 5 mg by mouth daily.    [provider]  Nebivolol HCl (BYSTOLIC) 20 MG TABS Take 1 tablet by mouth daily.    [provider]  Omega-3 Fatty Acids (FISH OIL) 1000 MG CAPS Take 1,000 mg by mouth 2 (two) times daily.    [provider]  pantoprazole (PROTONIX) 40 MG tablet Take 40 mg by mouth daily.    [provider]  Pitavastatin Calcium (LIVALO) 2 MG TABS Take 2 mg by mouth 2 (two) times a week.    [provider]  pregabalin (LYRICA) 25 MG capsule Take 1 capsule (25 mg total) by mouth 2 (two) times daily. 11/11/20 11/06/21  Minus Breeding, MD  Saxagliptin-Metformin 2.11-998 MG TB24 Take 1 tablet by mouth 2 (two) times daily. Reported on 08/21/2015    [provider]  VITAMIN D PO Take 1,000 Units by mouth daily.    [provider]  vitamin E 1000 UNIT capsule Take 1,000 Units by mouth daily.    [provider]     Positive ROS: All other systems have been reviewed and were otherwise negative with the  exception of those mentioned in the HPI and as above.  Physical Exam: General: Alert, no acute distress Cardiovascular: No pedal edema Respiratory: No cyanosis, no use of accessory musculature GI: No organomegaly, abdomen is soft and non-tender Skin: No lesions in the area of chief complaint Neurologic: Sensation intact distally Psychiatric: Patient is competent for consent with normal mood and affect Lymphatic: No axillary or cervical lymphadenopathy  MUSCULOSKELETAL: TTP left biceps tendon, limited left shoulder ROM due to pain, + O'Brien's   Assessment: LEFT SHOULDER BICIPITAL TENDONITIS  Plan: Plan for Procedure(s): ARTHROSCOPY SHOULDER BICEPS TENODESIS  The risks benefits and alternatives were discussed with the patient including but not limited to the risks of nonoperative treatment, versus surgical intervention including infection, bleeding, nerve injury,  blood clots, cardiopulmonary complications, morbidity, mortality, among others, and they were willing to proceed.    After surgery Weightbearing: NWB LUE Orthopedic devices: sling Showering: POD 3, keep incisions dry Dressing: Keep on until follow up. Reinforce as needed. Medicines: Norco 10, Tylenol, Diclofenac, Robaxin, Zofran  Discharge: home Follow up: 2 weeks   Alisa Graff Office [phone] 11/20/2020 5:58 PM

## 2020-11-21 ENCOUNTER — Encounter (HOSPITAL_BASED_OUTPATIENT_CLINIC_OR_DEPARTMENT_OTHER): Payer: Self-pay | Admitting: Orthopedic Surgery

## 2020-11-21 ENCOUNTER — Other Ambulatory Visit: Payer: Self-pay

## 2020-11-21 NOTE — Progress Notes (Addendum)
Spoke w/ via phone for pre-op interview--- PT Lab needs dos----  Istat             Lab results------ current ekg in epic/ chart COVID test ------ 11-25-2020 @ 1050  Arrive at ------- 0930 on 11-26-2020 NPO after MN NO Solid Food.  Clear liquids from MN until---  0830 Med rec completed Medications to take morning of surgery ----- Lyrica, Bystolic, Norvasc, Protonix Diabetic medication ----- do not take Invokana and saxagliptin--metformin morning of surgery   Patient instructed to bring photo id and insurance card day of surgery Patient aware to have Driver (ride ) / caregiver    for 24 hours after surgery-- per pt one of her children will be with her dos and caregiver, unsure which one yet   Patient Special Instructions ----- n/a Pre-Op special Istructions ----- pt has cardiac clearance office note by Dr Percival Spanish dated 11-11-2020 in epic/ chart Patient verbalized understanding of instructions that were given at this phone interview. Patient denies shortness of breath, chest pain, fever, cough at this phone interview.   Anesthesia Review:  HTN;  CAD s/p CABG x3 07/ 2021; s/p right CEA 2014; CKD 3; DM2.  Pt denies any cardiac s&s, no peripheral swelling, does have DOE.  PCP: Dr Lenard Simmer Cardiologist : Dr Percival Spanish [facility] 11-11-2020 epic) Vascular:  Dr Kellie Simmering (lov 07-15-2020 epic) Chest x-ray :  Chest CT 07-26-2020 epic EKG : 11-11-2020 Echo : 01-27-2020  Stress test: no Cardiac Cath :  01-26-2020 epic Activity level: DOE Sleep Study/ CPAP : NO Fasting Blood Sugar : 80--150     / Checks Blood Sugar -- times a day:  Daily in am  Blood Thinner/ Instructions /Last Dose: no ASA / Instructions/ Last Dose : ASA 325mg /  Pt stated has been given any instructions whether to stop asa or not.  Advised pt to called Dr Percell Miller for instruction.

## 2020-11-25 ENCOUNTER — Other Ambulatory Visit (HOSPITAL_COMMUNITY)
Admission: RE | Admit: 2020-11-25 | Discharge: 2020-11-25 | Disposition: A | Discharge status: Home / Self Care | Payer: Medicare Other | Source: Ambulatory Visit | Attending: Orthopedic Surgery | Admitting: Orthopedic Surgery

## 2020-11-25 DIAGNOSIS — Z01812 Encounter for preprocedural laboratory examination: Secondary | ICD-10-CM | POA: Diagnosis not present

## 2020-11-25 DIAGNOSIS — Z20822 Contact with and (suspected) exposure to covid-19: Secondary | ICD-10-CM | POA: Insufficient documentation

## 2020-11-25 LAB — SARS CORONAVIRUS 2 (TAT 6-24 HRS): SARS Coronavirus 2: NEGATIVE

## 2020-11-26 ENCOUNTER — Ambulatory Visit (HOSPITAL_BASED_OUTPATIENT_CLINIC_OR_DEPARTMENT_OTHER)
Admission: RE | Admit: 2020-11-26 | Discharge: 2020-11-26 | Disposition: A | Discharge status: Home / Self Care | Payer: Medicare Other | Attending: Orthopedic Surgery | Admitting: Orthopedic Surgery

## 2020-11-26 ENCOUNTER — Encounter (HOSPITAL_BASED_OUTPATIENT_CLINIC_OR_DEPARTMENT_OTHER): Payer: Self-pay | Admitting: Orthopedic Surgery

## 2020-11-26 ENCOUNTER — Encounter (HOSPITAL_BASED_OUTPATIENT_CLINIC_OR_DEPARTMENT_OTHER)
Admission: RE | Disposition: A | Discharge status: Home / Self Care | Payer: Self-pay | Source: Home / Self Care | Attending: Orthopedic Surgery

## 2020-11-26 ENCOUNTER — Ambulatory Visit: Payer: Medicare Other | Admitting: Cardiology

## 2020-11-26 ENCOUNTER — Ambulatory Visit (HOSPITAL_BASED_OUTPATIENT_CLINIC_OR_DEPARTMENT_OTHER): Payer: Medicare Other | Admitting: Anesthesiology

## 2020-11-26 ENCOUNTER — Other Ambulatory Visit: Payer: Self-pay

## 2020-11-26 DIAGNOSIS — E785 Hyperlipidemia, unspecified: Secondary | ICD-10-CM | POA: Diagnosis not present

## 2020-11-26 DIAGNOSIS — Z951 Presence of aortocoronary bypass graft: Secondary | ICD-10-CM | POA: Diagnosis not present

## 2020-11-26 DIAGNOSIS — Z853 Personal history of malignant neoplasm of breast: Secondary | ICD-10-CM | POA: Insufficient documentation

## 2020-11-26 DIAGNOSIS — Z881 Allergy status to other antibiotic agents status: Secondary | ICD-10-CM | POA: Insufficient documentation

## 2020-11-26 DIAGNOSIS — Z833 Family history of diabetes mellitus: Secondary | ICD-10-CM | POA: Diagnosis not present

## 2020-11-26 DIAGNOSIS — Z841 Family history of disorders of kidney and ureter: Secondary | ICD-10-CM | POA: Diagnosis not present

## 2020-11-26 DIAGNOSIS — Z801 Family history of malignant neoplasm of trachea, bronchus and lung: Secondary | ICD-10-CM | POA: Diagnosis not present

## 2020-11-26 DIAGNOSIS — D500 Iron deficiency anemia secondary to blood loss (chronic): Secondary | ICD-10-CM | POA: Diagnosis not present

## 2020-11-26 DIAGNOSIS — E1122 Type 2 diabetes mellitus with diabetic chronic kidney disease: Secondary | ICD-10-CM | POA: Diagnosis not present

## 2020-11-26 DIAGNOSIS — Z7984 Long term (current) use of oral hypoglycemic drugs: Secondary | ICD-10-CM | POA: Diagnosis not present

## 2020-11-26 DIAGNOSIS — Z888 Allergy status to other drugs, medicaments and biological substances status: Secondary | ICD-10-CM | POA: Insufficient documentation

## 2020-11-26 DIAGNOSIS — Z7982 Long term (current) use of aspirin: Secondary | ICD-10-CM | POA: Diagnosis not present

## 2020-11-26 DIAGNOSIS — N183 Chronic kidney disease, stage 3 unspecified: Secondary | ICD-10-CM | POA: Insufficient documentation

## 2020-11-26 DIAGNOSIS — Z79899 Other long term (current) drug therapy: Secondary | ICD-10-CM | POA: Insufficient documentation

## 2020-11-26 DIAGNOSIS — M7522 Bicipital tendinitis, left shoulder: Secondary | ICD-10-CM | POA: Diagnosis not present

## 2020-11-26 DIAGNOSIS — Z8249 Family history of ischemic heart disease and other diseases of the circulatory system: Secondary | ICD-10-CM | POA: Insufficient documentation

## 2020-11-26 DIAGNOSIS — Z823 Family history of stroke: Secondary | ICD-10-CM | POA: Insufficient documentation

## 2020-11-26 DIAGNOSIS — Z87891 Personal history of nicotine dependence: Secondary | ICD-10-CM | POA: Insufficient documentation

## 2020-11-26 DIAGNOSIS — G8918 Other acute postprocedural pain: Secondary | ICD-10-CM | POA: Diagnosis not present

## 2020-11-26 DIAGNOSIS — K219 Gastro-esophageal reflux disease without esophagitis: Secondary | ICD-10-CM | POA: Insufficient documentation

## 2020-11-26 DIAGNOSIS — I129 Hypertensive chronic kidney disease with stage 1 through stage 4 chronic kidney disease, or unspecified chronic kidney disease: Secondary | ICD-10-CM | POA: Diagnosis not present

## 2020-11-26 DIAGNOSIS — Z885 Allergy status to narcotic agent status: Secondary | ICD-10-CM | POA: Insufficient documentation

## 2020-11-26 DIAGNOSIS — M24112 Other articular cartilage disorders, left shoulder: Secondary | ICD-10-CM | POA: Diagnosis not present

## 2020-11-26 HISTORY — DX: Type 2 diabetes mellitus without complications: E11.9

## 2020-11-26 HISTORY — DX: Occlusion and stenosis of bilateral carotid arteries: I65.23

## 2020-11-26 HISTORY — DX: Personal history of malignant neoplasm of breast: Z85.3

## 2020-11-26 HISTORY — DX: Personal history of other malignant neoplasm of skin: Z85.828

## 2020-11-26 HISTORY — DX: Unspecified osteoarthritis, unspecified site: M19.90

## 2020-11-26 HISTORY — DX: Atherosclerotic heart disease of native coronary artery without angina pectoris: I25.10

## 2020-11-26 HISTORY — DX: Bicipital tendinitis, left shoulder: M75.22

## 2020-11-26 HISTORY — PX: SHOULDER ARTHROSCOPY: SHX128

## 2020-11-26 HISTORY — DX: Dyspnea, unspecified: R06.00

## 2020-11-26 HISTORY — DX: Other forms of dyspnea: R06.09

## 2020-11-26 LAB — POCT I-STAT, CHEM 8
BUN: 18 mg/dL (ref 8–23)
Calcium, Ion: 1.53 mmol/L (ref 1.15–1.40)
Chloride: 105 mmol/L (ref 98–111)
Creatinine, Ser: 0.8 mg/dL (ref 0.44–1.00)
Glucose, Bld: 152 mg/dL — ABNORMAL HIGH (ref 70–99)
HCT: 49 % — ABNORMAL HIGH (ref 36.0–46.0)
Hemoglobin: 16.7 g/dL — ABNORMAL HIGH (ref 12.0–15.0)
Potassium: 4.4 mmol/L (ref 3.5–5.1)
Sodium: 140 mmol/L (ref 135–145)
TCO2: 23 mmol/L (ref 22–32)

## 2020-11-26 LAB — GLUCOSE, CAPILLARY: Glucose-Capillary: 147 mg/dL — ABNORMAL HIGH (ref 70–99)

## 2020-11-26 SURGERY — ARTHROSCOPY, SHOULDER
Anesthesia: General | Site: Shoulder | Laterality: Left

## 2020-11-26 MED ORDER — SUGAMMADEX SODIUM 200 MG/2ML IV SOLN
INTRAVENOUS | Status: DC | PRN
  Administered 2020-11-26: 120 mg via INTRAVENOUS

## 2020-11-26 MED ORDER — ROCURONIUM BROMIDE 10 MG/ML (PF) SYRINGE
PREFILLED_SYRINGE | INTRAVENOUS | Status: AC
  Filled 2020-11-26: qty 10

## 2020-11-26 MED ORDER — PHENYLEPHRINE HCL-NACL 10-0.9 MG/250ML-% IV SOLN
INTRAVENOUS | Status: DC | PRN
  Administered 2020-11-26: 50 ug/min via INTRAVENOUS

## 2020-11-26 MED ORDER — PROPOFOL 10 MG/ML IV BOLUS
INTRAVENOUS | Status: DC | PRN
  Administered 2020-11-26: 100 mg via INTRAVENOUS

## 2020-11-26 MED ORDER — ACETAMINOPHEN 500 MG PO TABS: 1000.0000 mg | ORAL_TABLET | Freq: Four times a day (QID) | ORAL | 0 refills | Status: DC | PRN

## 2020-11-26 MED ORDER — LIDOCAINE 2% (20 MG/ML) 5 ML SYRINGE
INTRAMUSCULAR | Status: AC
  Filled 2020-11-26: qty 5

## 2020-11-26 MED ORDER — ONDANSETRON HCL 4 MG/2ML IJ SOLN
INTRAMUSCULAR | Status: AC
  Filled 2020-11-26: qty 2

## 2020-11-26 MED ORDER — PHENYLEPHRINE HCL (PRESSORS) 10 MG/ML IV SOLN
INTRAVENOUS | Status: AC
  Filled 2020-11-26: qty 1

## 2020-11-26 MED ORDER — ROCURONIUM BROMIDE 10 MG/ML (PF) SYRINGE
PREFILLED_SYRINGE | INTRAVENOUS | Status: DC | PRN
  Administered 2020-11-26: 40 mg via INTRAVENOUS

## 2020-11-26 MED ORDER — SODIUM CHLORIDE 0.9 % IR SOLN
Status: DC | PRN
  Administered 2020-11-26: 6000 mL

## 2020-11-26 MED ORDER — MIDAZOLAM HCL 2 MG/2ML IJ SOLN
INTRAMUSCULAR | Status: AC
  Filled 2020-11-26: qty 2

## 2020-11-26 MED ORDER — PHENYLEPHRINE 40 MCG/ML (10ML) SYRINGE FOR IV PUSH (FOR BLOOD PRESSURE SUPPORT)
PREFILLED_SYRINGE | INTRAVENOUS | Status: AC
  Filled 2020-11-26: qty 10

## 2020-11-26 MED ORDER — CEFAZOLIN SODIUM-DEXTROSE 2-4 GM/100ML-% IV SOLN
INTRAVENOUS | Status: AC
  Filled 2020-11-26: qty 100

## 2020-11-26 MED ORDER — HYDROCODONE-ACETAMINOPHEN 10-325 MG PO TABS: 1.0000 | ORAL_TABLET | Freq: Four times a day (QID) | ORAL | 0 refills | Status: DC | PRN

## 2020-11-26 MED ORDER — PROPOFOL 10 MG/ML IV BOLUS
INTRAVENOUS | Status: AC
  Filled 2020-11-26: qty 20

## 2020-11-26 MED ORDER — LIDOCAINE 2% (20 MG/ML) 5 ML SYRINGE
INTRAMUSCULAR | Status: DC | PRN
  Administered 2020-11-26: 60 mg via INTRAVENOUS

## 2020-11-26 MED ORDER — FENTANYL CITRATE (PF) 100 MCG/2ML IJ SOLN
INTRAMUSCULAR | Status: DC | PRN
  Administered 2020-11-26: 50 ug via INTRAVENOUS

## 2020-11-26 MED ORDER — DEXAMETHASONE SODIUM PHOSPHATE 10 MG/ML IJ SOLN: 8.0000 mg | Freq: Once | INTRAMUSCULAR | Status: DC

## 2020-11-26 MED ORDER — ONDANSETRON HCL 4 MG/2ML IJ SOLN
INTRAMUSCULAR | Status: DC | PRN
  Administered 2020-11-26: 4 mg via INTRAVENOUS

## 2020-11-26 MED ORDER — SODIUM CHLORIDE 0.9 % IV SOLN: INTRAVENOUS | Status: DC

## 2020-11-26 MED ORDER — FENTANYL CITRATE (PF) 100 MCG/2ML IJ SOLN
INTRAMUSCULAR | Status: AC
  Filled 2020-11-26: qty 2

## 2020-11-26 MED ORDER — PHENYLEPHRINE 40 MCG/ML (10ML) SYRINGE FOR IV PUSH (FOR BLOOD PRESSURE SUPPORT)
PREFILLED_SYRINGE | INTRAVENOUS | Status: DC | PRN
  Administered 2020-11-26 (×2): 120 ug via INTRAVENOUS

## 2020-11-26 MED ORDER — ACETAMINOPHEN 500 MG PO TABS
1000.0000 mg | ORAL_TABLET | Freq: Once | ORAL | Status: AC
  Administered 2020-11-26: 1000 mg via ORAL

## 2020-11-26 MED ORDER — ACETAMINOPHEN 500 MG PO TABS
ORAL_TABLET | ORAL | Status: AC
  Filled 2020-11-26: qty 2

## 2020-11-26 MED ORDER — FENTANYL CITRATE (PF) 100 MCG/2ML IJ SOLN
25.0000 ug | INTRAMUSCULAR | Status: DC | PRN
  Administered 2020-11-26: 50 ug via INTRAVENOUS

## 2020-11-26 MED ORDER — CEFAZOLIN SODIUM-DEXTROSE 2-4 GM/100ML-% IV SOLN
2.0000 g | INTRAVENOUS | Status: AC
  Administered 2020-11-26: 2 g via INTRAVENOUS

## 2020-11-26 MED ORDER — MIDAZOLAM HCL 2 MG/2ML IJ SOLN
1.0000 mg | INTRAMUSCULAR | Status: DC | PRN
  Administered 2020-11-26: 1 mg via INTRAVENOUS

## 2020-11-26 MED ORDER — METHOCARBAMOL 500 MG PO TABS: 500.0000 mg | ORAL_TABLET | Freq: Three times a day (TID) | ORAL | 0 refills | Status: DC | PRN

## 2020-11-26 MED ORDER — BUPIVACAINE LIPOSOME 1.3 % IJ SUSP
INTRAMUSCULAR | Status: DC | PRN
  Administered 2020-11-26: 10 mL via PERINEURAL

## 2020-11-26 MED ORDER — ONDANSETRON HCL 4 MG PO TABS: 4.0000 mg | ORAL_TABLET | Freq: Every day | ORAL | 0 refills | Status: DC | PRN

## 2020-11-26 MED ORDER — BUPIVACAINE-EPINEPHRINE (PF) 0.5% -1:200000 IJ SOLN
INTRAMUSCULAR | Status: DC | PRN
  Administered 2020-11-26: 15 mL via PERINEURAL

## 2020-11-26 SURGICAL SUPPLY — 79 items
AID PSTN UNV HD RSTRNT DISP (MISCELLANEOUS) ×1
ANCH SUT 2.9 PUSHLOCK ANCH (Orthopedic Implant) ×1 IMPLANT
APL PRP STRL LF DISP 70% ISPRP (MISCELLANEOUS) ×1
BLADE SURG 15 STRL LF DISP TIS (BLADE) IMPLANT
BLADE SURG 15 STRL SS (BLADE)
BURR CLEARCUT OVAL 5.5X13 (MISCELLANEOUS) IMPLANT
BURR OVAL 12 FL 5.5X13 (MISCELLANEOUS)
CANNULA 5.75X71 LONG (CANNULA) IMPLANT
CANNULA TWIST IN 8.25X7CM (CANNULA) ×1 IMPLANT
CHLORAPREP W/TINT 26 (MISCELLANEOUS) ×2 IMPLANT
CLSR STERI-STRIP ANTIMIC 1/2X4 (GAUZE/BANDAGES/DRESSINGS) ×2 IMPLANT
COVER WAND RF STERILE (DRAPES) ×2 IMPLANT
DISSECTOR 4.0MM X 13CM (MISCELLANEOUS) ×1 IMPLANT
DRAPE IMP U-DRAPE 54X76 (DRAPES) ×2 IMPLANT
DRAPE INCISE IOBAN 66X45 STRL (DRAPES) ×1 IMPLANT
DRAPE ORTHO SPLIT 77X108 STRL (DRAPES) ×4
DRAPE SHOULDER BEACH CHAIR (DRAPES) ×2 IMPLANT
DRAPE STERI 35X30 U-POUCH (DRAPES) ×2 IMPLANT
DRAPE SURG ORHT 6 SPLT 77X108 (DRAPES) ×2 IMPLANT
DRAPE U-SHAPE 47X51 STRL (DRAPES) ×2 IMPLANT
DRSG EMULSION OIL 3X3 NADH (GAUZE/BANDAGES/DRESSINGS) ×1 IMPLANT
DRSG PAD ABDOMINAL 8X10 ST (GAUZE/BANDAGES/DRESSINGS) ×2 IMPLANT
ELECT REM PT RETURN 9FT ADLT (ELECTROSURGICAL)
ELECTRODE REM PT RTRN 9FT ADLT (ELECTROSURGICAL) IMPLANT
GAUZE SPONGE 4X4 12PLY STRL (GAUZE/BANDAGES/DRESSINGS) ×3 IMPLANT
GLOVE SRG 8 PF TXTR STRL LF DI (GLOVE) ×2 IMPLANT
GLOVE SURG ENC MOIS LTX SZ6.5 (GLOVE) ×2 IMPLANT
GLOVE SURG ENC MOIS LTX SZ7.5 (GLOVE) ×5 IMPLANT
GLOVE SURG UNDER POLY LF SZ6.5 (GLOVE) ×4 IMPLANT
GLOVE SURG UNDER POLY LF SZ7.5 (GLOVE) ×2 IMPLANT
GLOVE SURG UNDER POLY LF SZ8 (GLOVE) ×4
GOWN STRL REUS W/ TWL LRG LVL3 (GOWN DISPOSABLE) IMPLANT
GOWN STRL REUS W/TWL LRG LVL3 (GOWN DISPOSABLE) ×6 IMPLANT
IV NS IRRIG 3000ML ARTHROMATIC (IV SOLUTION) ×6 IMPLANT
KIT PUSHLOCK 2.9 HIP (KITS) IMPLANT
KIT TURNOVER CYSTO (KITS) ×2 IMPLANT
MANIFOLD NEPTUNE II (INSTRUMENTS) ×2 IMPLANT
NDL SCORPION MULTI FIRE (NEEDLE) IMPLANT
NDL SUT 6 .5 CRC .975X.05 MAYO (NEEDLE) IMPLANT
NEEDLE MAYO TAPER (NEEDLE)
NEEDLE SCORPION MULTI FIRE (NEEDLE) IMPLANT
NS IRRIG 1000ML POUR BTL (IV SOLUTION) IMPLANT
PACK ARTHROSCOPY DSU (CUSTOM PROCEDURE TRAY) ×2 IMPLANT
PACK BASIN DAY SURGERY FS (CUSTOM PROCEDURE TRAY) ×2 IMPLANT
PASSER SUT SWIFTSTITCH HIP CRT (INSTRUMENTS) ×2 IMPLANT
PENCIL SMOKE EVACUATOR (MISCELLANEOUS) IMPLANT
PORT APPOLLO RF 90DEGREE MULTI (SURGICAL WAND) IMPLANT
RESTRAINT HEAD UNIVERSAL NS (MISCELLANEOUS) ×1 IMPLANT
SLEEVE SCD COMPRESS KNEE MED (STOCKING) ×2 IMPLANT
SLING ARM FOAM STRAP LRG (SOFTGOODS) IMPLANT
SLING ARM FOAM STRAP MED (SOFTGOODS) ×1 IMPLANT
SLING ARM FOAM STRAP XLG (SOFTGOODS) IMPLANT
SLING ARM IMMOBILIZER LRG (SOFTGOODS) IMPLANT
SLING ARM IMMOBILIZER MED (SOFTGOODS) IMPLANT
STRIP CLOSURE SKIN 1/2X4 (GAUZE/BANDAGES/DRESSINGS) IMPLANT
SUCTION FRAZIER HANDLE 10FR (MISCELLANEOUS)
SUCTION TUBE FRAZIER 10FR DISP (MISCELLANEOUS) IMPLANT
SUT ETHIBOND 2 OS 4 DA (SUTURE) IMPLANT
SUT ETHILON 2 0 FS 18 (SUTURE) IMPLANT
SUT ETHILON 3 0 PS 1 (SUTURE) ×2 IMPLANT
SUT FIBERWIRE #2 38 T-5 BLUE (SUTURE)
SUT MNCRL AB 4-0 PS2 18 (SUTURE) IMPLANT
SUT TIGER TAPE 7 IN WHITE (SUTURE) IMPLANT
SUT VIC AB 0 CT1 27 (SUTURE)
SUT VIC AB 0 CT1 27XBRD ANBCTR (SUTURE) IMPLANT
SUT VIC AB 2-0 SH 27 (SUTURE)
SUT VIC AB 2-0 SH 27XBRD (SUTURE) IMPLANT
SUT VIC AB 3-0 FS2 27 (SUTURE) IMPLANT
SUTURE FIBERWR #2 38 T-5 BLUE (SUTURE) IMPLANT
SUTURE TAPE TIGERLINK 1.3MM BL (SUTURE) IMPLANT
SUTURETAPE TIGERLINK 1.3MM BL (SUTURE)
SYSTEM IMPL TENODESIS LNT 2.9 (Orthopedic Implant) ×1 IMPLANT
TAPE CLOTH SURG 6X10 WHT LF (GAUZE/BANDAGES/DRESSINGS) ×1 IMPLANT
TAPE FIBER 2MM 7IN #2 BLUE (SUTURE) IMPLANT
TOWEL OR 17X26 10 PK STRL BLUE (TOWEL DISPOSABLE) ×2 IMPLANT
TUBING ARTHROSCOPY IRRIG 16FT (MISCELLANEOUS) ×2 IMPLANT
Universal Head Restraint ×1 IMPLANT
WATER STERILE IRR 1000ML POUR (IV SOLUTION) IMPLANT
YANKAUER SUCT BULB TIP NO VENT (SUCTIONS) IMPLANT

## 2020-11-26 NOTE — Interval H&P Note (Signed)
History and Physical Interval Note:  11/26/2020 9:07 AM  [place]  has presented today for surgery, with the diagnosis of LEFT SHOULDER BICIPITAL TENDONITIS.  The various methods of treatment have been discussed with the patient and family. After consideration of risks, benefits and other options for treatment, the patient has consented to  Procedure(s): ARTHROSCOPY SHOULDER BICEPS TENODESIS (Left) as a surgical intervention.  The patient's history has been reviewed, patient examined, no change in status, stable for surgery.  I have reviewed the patient's chart and labs.  Questions were answered to the patient's satisfaction.     Renette Butters

## 2020-11-26 NOTE — Anesthesia Postprocedure Evaluation (Signed)
Anesthesia Post Note  Patient: Faith Gaines  Procedure(s) Performed: ARTHROSCOPY SHOULDER BICEPS TENODESIS (Left Shoulder)     Patient location during evaluation: PACU Anesthesia Type: General Level of consciousness: awake and alert and oriented Pain management: pain level controlled Vital Signs Assessment: post-procedure vital signs reviewed and stable Respiratory status: spontaneous breathing, nonlabored ventilation and respiratory function stable Cardiovascular status: blood pressure returned to baseline Postop Assessment: no apparent nausea or vomiting Anesthetic complications: no   No complications documented.  Last Vitals:  Vitals:   11/26/20 1300 11/26/20 1315  BP: 129/60   Pulse: 62   Resp: 17   Temp:  36.5 C  SpO2: 96%     Last Pain:  Vitals:   11/26/20 1315  TempSrc:   PainSc: 0-No pain                 Brennan Bailey

## 2020-11-26 NOTE — Transfer of Care (Signed)
Immediate Anesthesia Transfer of Care Note  Patient: Faith Gaines  Procedure(s) Performed: ARTHROSCOPY SHOULDER BICEPS TENODESIS (Left Shoulder)  Patient Location: PACU  Anesthesia Type:General  Level of Consciousness: awake, alert , oriented and patient cooperative  Airway & Oxygen Therapy: Patient Spontanous Breathing and Patient connected to nasal cannula oxygen  Post-op Assessment: Report given to RN and Post -op Vital signs reviewed and stable  Post vital signs: Reviewed and stable  Last Vitals:  Vitals Value Taken Time  BP 121/70 11/26/20 1253  Temp    Pulse 63 11/26/20 1255  Resp 19 11/26/20 1255  SpO2 96 % 11/26/20 1255  Vitals shown include unvalidated device data.  Last Pain:  Vitals:   11/26/20 0929  TempSrc: Oral  PainSc: 0-No pain         Complications: No complications documented.

## 2020-11-26 NOTE — Anesthesia Procedure Notes (Signed)
Anesthesia Regional Block: Interscalene brachial plexus block   Pre-Anesthetic Checklist: ,, timeout performed, Correct Patient, Correct Site, Correct Laterality, Correct Procedure, Correct Position, site marked, Risks and benefits discussed, pre-op evaluation,  At surgeon's request and post-op pain management  Laterality: Left  Prep: Maximum Sterile Barrier Precautions used, chloraprep       Needles:  Injection technique: Single-shot  Needle Type: Echogenic Stimulator Needle     Needle Length: 4cm  Needle Gauge: 22     Additional Needles:   Procedures:,,,, ultrasound used (permanent image in chart),,,,  Narrative:  Start time: 11/26/2020 11:11 AM End time: 11/26/2020 11:13 AM Injection made incrementally with aspirations every 5 mL.  Performed by: Personally  Anesthesiologist: Brennan Bailey, MD  Additional Notes: Risks, benefits, and alternative discussed. Patient gave consent for procedure. Patient prepped and draped in sterile fashion. Sedation administered, patient remains easily responsive to voice. Relevant anatomy identified with ultrasound guidance. Local anesthetic given in 5cc increments with no signs or symptoms of intravascular injection. No pain or paraesthesias with injection. Patient monitored throughout procedure with signs of LAST or immediate complications. Tolerated well. Ultrasound image placed in chart.  Tawny Asal, MD

## 2020-11-26 NOTE — Op Note (Signed)
11/26/2020  9:12 AM  PATIENT:  Faith Gaines    PRE-OPERATIVE DIAGNOSIS:  LEFT SHOULDER BICIPITAL TENDONITIS  POST-OPERATIVE DIAGNOSIS:  Same  PROCEDURE:  ARTHROSCOPY SHOULDER BICEPS TENODESIS  SURGEON:  Renette Butters, MD  ASSISTANT: Aggie Moats, PA-C, he was present and scrubbed throughout the case, critical for completion in a timely fashion, and for retraction, instrumentation, and closure.   ANESTHESIA:   General  PREOPERATIVE INDICATIONS:  Faith Gaines is a  75 y.o. female with a diagnosis of LEFT SHOULDER BICIPITAL TENDONITIS who failed conservative measures and elected for surgical management.    The risks benefits and alternatives were discussed with the patient preoperatively including but not limited to the risks of infection, bleeding, nerve injury, cardiopulmonary complications, the need for revision surgery, among others, and the patient was willing to proceed.  OPERATIVE IMPLANTS: arthrex anchor 2.9 pushlock  OPERATIVE FINDINGS: bi tendonosis  BLOOD LOSS: minimal  COMPLICATIONS: none  OPERATIVE PROCEDURE:  Patient was identified in the preoperative holding area and site was marked by me He was transported to the operating theater and placed on the table in beach chair position taking care to pad all bony prominences. After a preincinduction time out anesthesia was induced. The left upper extremity was prepped and draped in normal sterile fashion and a pre-incision timeout was performed. Faith Gaines received ancef for preoperative antibiotics.   Initially made a posterior arthroscopic portal and inserted the arthroscope into the glenohumeral joint. tour of the joint demonstrated the above operative findings  I created an anterior portal just lateral to the coracoid under direct visualization using a spinal needle.  I performed an extensive debridement of the scarred synovial tissue and superior labrum.   I performed a loop  and tack biceps tenodesis with a 2.9 pushlock.   Next I removed all arthroscopic equipment expressed all fluid and closed the portals with a nylon stitch. A sterile dressing was applied the patient was taken the PACU in stable condition.  POST OPERATIVE PLAN: The patient will be in a sling full-time and keep the dressings clean dry and intact. DVT prophylaxis will consist of early ambulation

## 2020-11-26 NOTE — Anesthesia Preprocedure Evaluation (Addendum)
Anesthesia Evaluation  Patient identified by MRN, date of birth, ID band Patient awake    Reviewed: Allergy & Precautions, NPO status , Patient's Chart, lab work & pertinent test results, reviewed documented beta blocker date and time   History of Anesthesia Complications Negative for: history of anesthetic complications  Airway Mallampati: II  TM Distance: >3 FB Neck ROM: Full    Dental no notable dental hx.    Pulmonary neg pulmonary ROS, former smoker,    Pulmonary exam normal        Cardiovascular hypertension, Pt. on medications and Pt. on home beta blockers + CAD, + CABG (02/01/20) and + Peripheral Vascular Disease  Normal cardiovascular exam  Normal EF, moderate TR   Neuro/Psych  Headaches, Anxiety Depression    GI/Hepatic Neg liver ROS, GERD  Medicated and Controlled,  Endo/Other  diabetes, Type 2, Oral Hypoglycemic Agents  Renal/GU Renal InsufficiencyRenal disease  negative genitourinary   Musculoskeletal  (+) Arthritis ,   Abdominal   Peds  Hematology negative hematology ROS (+)   Anesthesia Other Findings Day of surgery medications reviewed with patient.  Reproductive/Obstetrics negative OB ROS                            Anesthesia Physical Anesthesia Plan  ASA: III  Anesthesia Plan: General   Post-op Pain Management: GA combined w/ Regional for post-op pain   Induction: Intravenous  PONV Risk Score and Plan: 3 and Treatment may vary due to age or medical condition, Ondansetron, Dexamethasone and Midazolam  Airway Management Planned: Oral ETT  Additional Equipment: None  Intra-op Plan:   Post-operative Plan: Extubation in OR  Informed Consent: I have reviewed the patients History and Physical, chart, labs and discussed the procedure including the risks, benefits and alternatives for the proposed anesthesia with the patient or authorized representative who has  indicated his/her understanding and acceptance.     Dental advisory given  Plan Discussed with: CRNA  Anesthesia Plan Comments:        Anesthesia Quick Evaluation

## 2020-11-26 NOTE — Anesthesia Procedure Notes (Signed)
Procedure Name: Intubation Date/Time: 11/26/2020 11:53 AM Performed by: Bonney Aid, CRNA Pre-anesthesia Checklist: Patient identified, Emergency Drugs available, Suction available and Patient being monitored Patient Re-evaluated:Patient Re-evaluated prior to induction Oxygen Delivery Method: Circle system utilized Preoxygenation: Pre-oxygenation with 100% oxygen Induction Type: IV induction Ventilation: Mask ventilation without difficulty Laryngoscope Size: Mac and 3 Grade View: Grade I Tube type: Oral Tube size: 7.0 mm Number of attempts: 1 Airway Equipment and Method: Stylet and Oral airway Placement Confirmation: ETT inserted through vocal cords under direct vision,  positive ETCO2 and breath sounds checked- equal and bilateral Secured at: 19 cm Tube secured with: Tape Dental Injury: Teeth and Oropharynx as per pre-operative assessment

## 2020-11-26 NOTE — Progress Notes (Signed)
Assisted Dr. Daiva Huge with left, ultrasound guided, interscalene  block. Side rails up, monitors on throughout procedure. See vital signs in flow sheet. Tolerated Procedure well.

## 2020-11-26 NOTE — Discharge Instructions (Signed)
Regional Anesthesia Blocks  1. Numbness or the inability to move the "blocked" extremity may last from 3-48 hours after placement. The length of time depends on the medication injected and your individual response to the medication. If the numbness is not going away after 48 hours, call your surgeon.  2. The extremity that is blocked will need to be protected until the numbness is gone and the  Strength has returned. Because you cannot feel it, you will need to take extra care to avoid injury. Because it may be weak, you may have difficulty moving it or using it. You may not know what position it is in without looking at it while the block is in effect.  3. For blocks in the legs and feet, returning to weight bearing and walking needs to be done carefully. You will need to wait until the numbness is entirely gone and the strength has returned. You should be able to move your leg and foot normally before you try and bear weight or walk. You will need someone to be with you when you first try to ensure you do not fall and possibly risk injury.  4. Bruising and tenderness at the needle site are common side effects and will resolve in a few days.  5. Persistent numbness or new problems with movement should be communicated to the surgeon or the [place] [phone] [place] [phone]).  Information for Discharge Teaching: EXPAREL (bupivacaine liposome injectable suspension)   Your surgeon or anesthesiologist gave you EXPAREL(bupivacaine) to help control your pain after surgery.   EXPAREL is a local anesthetic that provides pain relief by numbing the tissue around the surgical site.  EXPAREL is designed to release pain medication over time and can control pain for up to 72 hours.  Depending on how you respond to EXPAREL, you may require less pain medication during your recovery.  Possible side effects:  Temporary loss of sensation or ability to move in the  area where bupivacaine was injected.  Nausea, vomiting, constipation  Rarely, numbness and tingling in your mouth or lips, lightheadedness, or anxiety may occur.  Call your doctor right away if you think you may be experiencing any of these sensations, or if you have other questions regarding possible side effects.  Follow all other discharge instructions given to you by your surgeon or nurse. Eat a healthy diet and drink plenty of water or other fluids.  If you return to the hospital for any reason within 96 hours following the administration of EXPAREL, it is important for health care providers to know that you have received this anesthetic. A teal colored band has been placed on your arm with the date, time and amount of EXPAREL you have received in order to alert and inform your health care providers. Please leave this armband in place for the full 96 hours following administration, and then you may remove the band.  Post Anesthesia Home Care Instructions  Activity: Get plenty of rest for the remainder of the day. A responsible individual must stay with you for 24 hours following the procedure.  For the next 24 hours, DO NOT: -Drive a car -Paediatric nurse -Drink alcoholic beverages -Take any medication unless instructed by your physician -Make any legal decisions or sign important papers.  Meals: Start with liquid foods such as gelatin or soup. Progress to regular foods as tolerated. Avoid greasy, spicy, heavy foods. If nausea and/or vomiting occur, drink only clear liquids until the nausea  and/or vomiting subsides. Call your physician if vomiting continues.  Special Instructions/Symptoms: Your throat may feel dry or sore from the anesthesia or the breathing tube placed in your throat during surgery. If this causes discomfort, gargle with warm salt water. The discomfort should disappear within 24 hours.   POST-OPERATIVE OPIOID TAPER INSTRUCTIONS: . It is important to wean off of  your opioid medication as soon as possible. If you do not need pain medication after your surgery it is ok to stop day one. Marland Kitchen Opioids include: o Codeine, Hydrocodone(Norco, Vicodin), Oxycodone(Percocet, oxycontin) and hydromorphone amongst others.  . Long term and even short term use of opiods can cause: o Increased pain response o Dependence o Constipation o Depression o Respiratory depression o And more.  . Withdrawal symptoms can include o Flu like symptoms o Nausea, vomiting o And more . Techniques to manage these symptoms o Hydrate well o Eat regular healthy meals o Stay active o Use relaxation techniques(deep breathing, meditating, yoga) . Do Not substitute Alcohol to help with tapering . If you have been on opioids for less than two weeks and do not have pain than it is ok to stop all together.  . Plan to wean off of opioids o This plan should start within one week post op of your joint replacement. o Maintain the same interval or time between taking each dose and first decrease the dose.  o Cut the total daily intake of opioids by one tablet each day o Next start to increase the time between doses. o The last dose that should be eliminated is the evening dose.

## 2020-11-27 ENCOUNTER — Encounter (HOSPITAL_BASED_OUTPATIENT_CLINIC_OR_DEPARTMENT_OTHER): Payer: Self-pay | Admitting: Orthopedic Surgery

## 2020-12-06 DIAGNOSIS — M24112 Other articular cartilage disorders, left shoulder: Secondary | ICD-10-CM | POA: Diagnosis not present

## 2020-12-12 DIAGNOSIS — M24112 Other articular cartilage disorders, left shoulder: Secondary | ICD-10-CM | POA: Diagnosis not present

## 2020-12-12 DIAGNOSIS — R293 Abnormal posture: Secondary | ICD-10-CM | POA: Diagnosis not present

## 2020-12-12 DIAGNOSIS — R0789 Other chest pain: Secondary | ICD-10-CM | POA: Diagnosis not present

## 2020-12-12 DIAGNOSIS — M7522 Bicipital tendinitis, left shoulder: Secondary | ICD-10-CM | POA: Diagnosis not present

## 2020-12-12 DIAGNOSIS — M2560 Stiffness of unspecified joint, not elsewhere classified: Secondary | ICD-10-CM | POA: Diagnosis not present

## 2020-12-12 DIAGNOSIS — M25622 Stiffness of left elbow, not elsewhere classified: Secondary | ICD-10-CM | POA: Diagnosis not present

## 2020-12-12 DIAGNOSIS — M25612 Stiffness of left shoulder, not elsewhere classified: Secondary | ICD-10-CM | POA: Diagnosis not present

## 2020-12-12 DIAGNOSIS — M25512 Pain in left shoulder: Secondary | ICD-10-CM | POA: Diagnosis not present

## 2020-12-12 DIAGNOSIS — M79602 Pain in left arm: Secondary | ICD-10-CM | POA: Diagnosis not present

## 2020-12-12 DIAGNOSIS — M542 Cervicalgia: Secondary | ICD-10-CM | POA: Diagnosis not present

## 2020-12-17 DIAGNOSIS — M542 Cervicalgia: Secondary | ICD-10-CM | POA: Diagnosis not present

## 2020-12-17 DIAGNOSIS — M7522 Bicipital tendinitis, left shoulder: Secondary | ICD-10-CM | POA: Diagnosis not present

## 2020-12-17 DIAGNOSIS — M24112 Other articular cartilage disorders, left shoulder: Secondary | ICD-10-CM | POA: Diagnosis not present

## 2020-12-17 DIAGNOSIS — M25512 Pain in left shoulder: Secondary | ICD-10-CM | POA: Diagnosis not present

## 2020-12-17 DIAGNOSIS — R0789 Other chest pain: Secondary | ICD-10-CM | POA: Diagnosis not present

## 2020-12-17 DIAGNOSIS — M79602 Pain in left arm: Secondary | ICD-10-CM | POA: Diagnosis not present

## 2020-12-18 ENCOUNTER — Ambulatory Visit (INDEPENDENT_AMBULATORY_CARE_PROVIDER_SITE_OTHER): Payer: Medicare Other | Admitting: Pharmacist Clinician (PhC)/ Clinical Pharmacy Specialist

## 2020-12-18 ENCOUNTER — Other Ambulatory Visit: Payer: Self-pay

## 2020-12-18 DIAGNOSIS — E782 Mixed hyperlipidemia: Secondary | ICD-10-CM | POA: Diagnosis not present

## 2020-12-18 NOTE — Patient Instructions (Addendum)
Your Results:             Your most recent labs Goal  Total Cholesterol 186 < 200  Triglycerides 374 < 150  HDL (happy/good cholesterol) 33 > 40  LDL (lousy/bad cholesterol 78 < 50   Medication changes:  We will start the paperwork to get Repatha approved by [employer].  Once approved take the Repatha (or Praluent) every 14 days.   Lab orders:  Repeat labs after 4-5 doses.     Thank you for choosing [facility]

## 2020-12-18 NOTE — Assessment & Plan Note (Addendum)
Patient with history of hyperlipidemia as well as ASCVD.  Has been able to tolerate pitavastatin 2 mg just twice weekly.  Reviewed options for lowering LDL cholesterol, including ezetimibe, PCSK-9 inhibitors, bempedoic acid and inclisiran.  Discussed mechanisms of action, dosing, side effects and potential decreases in LDL cholesterol.  Answered all patient questions.  Based on this information, patient would prefer to start PCSK-9 inhibitor.  She actually would prefer to try bempedoic acid, however she has recently had surgery to repair a torn shoulder tendon, and bempedoic acid has concerns for ruptured tendons in research.  Once PCSK-9 inhibitor is approved, will have patient repeat labs after 4-5 doses.    Her triglycerides are also elevated on most recent labs, advised that we will continue to monitor and should they remain elevated will consider prescription omega 3.

## 2020-12-18 NOTE — Progress Notes (Signed)
12/18/2020 Faith Gaines 03-05-1946 [id number]   HPI:  Faith Gaines is a 75 y.o. female patient of Dr Percival Spanish, who presents today for a lipid clinic evaluation.  See pertinent past medical history below.  Patient is currently on pitavastatin 2 mg and has been unable to tolerate higher doses or other statin drugs.    Past Medical History: ASCVD Carotid stenosis, CABG x 3 (7/21), PAD  hypertension On amlodipine 5 qd, nebivolol 25 mg qd  DM2 A1c 6.4 - on saxagliptin/metformin 2.5/100 bid, canagliflozin 300 mg qd  Breast cancer Lumpectomy 2015    Current Medications: pitavastatin 2 mg twice weekly (Sunday and Wednesday)  Cholesterol Goals: LDL < 70  Family history: father died from MI at 21, mother had MI at 110, died at 72, sister had MI in her 26's, doing well now; 3 children, not sure if any issues yet, but 2 are overweight  Diet: mostly home cooking; admits to eating "a lot of junk food" (crackers and chips); eats out about once weekly; also snacks on vegetables   Exercise:  Therapy for shoulder surgery, was walking prior to surgery, would like to get back  Labs: 7/21: TC 186, TG 374, HDL 33, LDL 78 (on pitavastatin 2 mg)   Current Outpatient Medications  Medication Sig Dispense Refill  . acetaminophen (TYLENOL) 500 MG tablet Take 2 tablets (1,000 mg total) by mouth every 6 (six) hours as needed for mild pain or moderate pain. 80 tablet 0  . amitriptyline (ELAVIL) 25 MG tablet TAKE 1 TABLET BY MOUTH EVERYDAY AT BEDTIME (Patient taking differently: Take by mouth at bedtime.) 90 tablet 2  . amLODipine (NORVASC) 5 MG tablet Take 5 mg by mouth daily.    . Biotin 5000 MCG CAPS Take 2 capsules by mouth daily.    . Blood Glucose Monitoring Suppl (FREESTYLE INSULINX SYSTEM) w/Device KIT See admin instructions.    . canagliflozin (INVOKANA) 300 MG TABS tablet Take 300 mg by mouth daily.    . famotidine (PEPCID) 40 MG tablet Take 40 mg by mouth at bedtime.    Marland Kitchen  HYDROcodone-acetaminophen (NORCO) 10-325 MG tablet Take 1 tablet by mouth every 6 (six) hours as needed for severe pain. 20 tablet 0  . methocarbamol (ROBAXIN) 500 MG tablet Take 1 tablet (500 mg total) by mouth every 8 (eight) hours as needed for muscle spasms. 20 tablet 0  . Multiple Vitamins-Minerals (PRESERVISION AREDS 2 PO) Take 1 tablet by mouth daily.    . nebivolol (BYSTOLIC) 5 MG tablet Take 5 mg by mouth daily. Takes with 56m to total 257m   . Nebivolol HCl (BYSTOLIC) 20 MG TABS Take 1 tablet by mouth daily. Takes with 5 mg to total 25 mg    . Omega-3 Fatty Acids (FISH OIL) 1000 MG CAPS Take 1,000 mg by mouth 2 (two) times daily.    . ondansetron (ZOFRAN) 4 MG tablet Take 1 tablet (4 mg total) by mouth daily as needed for nausea or vomiting. 10 tablet 0  . pantoprazole (PROTONIX) 40 MG tablet Take 40 mg by mouth daily.    . Pitavastatin Calcium (LIVALO) 2 MG TABS Take 2 mg by mouth 2 (two) times a week.    . pregabalin (LYRICA) 25 MG capsule Take 1 capsule (25 mg total) by mouth 2 (two) times daily. 180 capsule 3  . Saxagliptin-Metformin 2.11-998 MG TB24 Take 1 tablet by mouth 2 (two) times daily.    . Marland KitchenITAMIN D PO Take 1,000 Units  by mouth daily.    . vitamin E 1000 UNIT capsule Take 1,000 Units by mouth daily.    Marland Kitchen aspirin 81 MG tablet Take 1 tablet (81 mg total) by mouth daily. (Patient not taking: Reported on 11/21/2020)     No current facility-administered medications for this visit.    Allergies  Allergen Reactions  . Codeine Nausea And Vomiting and Other (See Comments)    And Headache   . Ace Inhibitors     Drop in GFR  . Clarithromycin     Unable to sleep   . Crestor [Rosuvastatin]     Body aches  . Hydroxychloroquine Sulfate Other (See Comments)    Per pt is has been so long ago, unsure reaction  . Lipitor [Atorvastatin]     Body aches  . Avandamet [Rosiglitazone-Metformin] Rash and Other (See Comments)    Makes her hyper  . Betadine [Povidone Iodine] Rash     Past Medical History:  Diagnosis Date  . Anxiety   . Bicipital tendonitis of left shoulder   . Bilateral carotid artery stenosis without cerebral infarction    vascular--- dr Kellie Simmering---- s/p right CEA 12/ 2014;  last duplex in epic 07-15-2020  post right CEA with 1-39% stenosis and left ICA 1-39%  . CKD (chronic kidney disease), stage III ([place])    followed by pcp  . Coronary artery disease cardiologist--- dr Warren Lacy   02-01-2020 CABG x3  . Depression   . DOE (dyspnea on exertion)    per pt still healing from CABG 07/ 2021  . GERD (gastroesophageal reflux disease)   . History of left breast cancer oncologist--- dr c. Hinton Rao ([facility])   dx 2015;   08-22-2013 s/p left partial masectomy, Stage 0, low grade DCIS,  no chemo/ radiation  . History of malignant melanoma 1982   excision left leg, per pt localized  . History of skin cancer    per pt multiple excision's of BCC, SCC, and non-malignant melanoma  . Hyperlipidemia   . Hypertension   . Iron deficiency anemia    hemotology/ oncology---- dr Hinton Rao----   has had iron infusions  . OA (osteoarthritis)   . Osteopenia   . Peripheral vascular disease ([place])    vascular--- dr Kellie Simmering  . 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 Type 2 diabetes mellitus ([place])    followed by pcp--- (11-21-2020  per pt check blood sugar daily in am,  fasting sugar-- 80--150)    Blood pressure 134/82, pulse 76, resp. rate 15, height 5' 1" (1.549 m), weight 118 lb (53.5 kg), SpO2 97 %.   Hyperlipidemia Patient with history of hyperlipidemia as well as ASCVD.  Has been able to tolerate pitavastatin 2 mg just twice weekly.  Reviewed options for lowering LDL cholesterol, including ezetimibe, PCSK-9 inhibitors, bempedoic acid and inclisiran.  Discussed mechanisms of action, dosing, side effects and potential decreases in LDL cholesterol.  Answered all patient questions.  Based on this information, patient  would prefer to start PCSK-9 inhibitor.  She actually would prefer to try bempedoic acid, however she has recently had surgery to repair a torn shoulder tendon, and bempedoic acid has concerns for ruptured tendons in research.  Once PCSK-9 inhibitor is approved, will have patient repeat labs after 4-5 doses.    Her triglycerides are also elevated on most recent labs, advised that we will continue to monitor and should they remain  elevated will consider prescription omega 3.     Tommy Medal PharmD CPP [facility] [address] [place], [place] [address] [phone]

## 2020-12-20 DIAGNOSIS — M25512 Pain in left shoulder: Secondary | ICD-10-CM | POA: Diagnosis not present

## 2020-12-20 DIAGNOSIS — M542 Cervicalgia: Secondary | ICD-10-CM | POA: Diagnosis not present

## 2020-12-20 DIAGNOSIS — M24112 Other articular cartilage disorders, left shoulder: Secondary | ICD-10-CM | POA: Diagnosis not present

## 2020-12-20 DIAGNOSIS — M79602 Pain in left arm: Secondary | ICD-10-CM | POA: Diagnosis not present

## 2020-12-20 DIAGNOSIS — M25612 Stiffness of left shoulder, not elsewhere classified: Secondary | ICD-10-CM | POA: Diagnosis not present

## 2020-12-20 DIAGNOSIS — M6281 Muscle weakness (generalized): Secondary | ICD-10-CM | POA: Diagnosis not present

## 2020-12-20 DIAGNOSIS — R0789 Other chest pain: Secondary | ICD-10-CM | POA: Diagnosis not present

## 2020-12-20 DIAGNOSIS — M2560 Stiffness of unspecified joint, not elsewhere classified: Secondary | ICD-10-CM | POA: Diagnosis not present

## 2020-12-20 DIAGNOSIS — M7522 Bicipital tendinitis, left shoulder: Secondary | ICD-10-CM | POA: Diagnosis not present

## 2020-12-20 DIAGNOSIS — M25622 Stiffness of left elbow, not elsewhere classified: Secondary | ICD-10-CM | POA: Diagnosis not present

## 2020-12-20 DIAGNOSIS — R293 Abnormal posture: Secondary | ICD-10-CM | POA: Diagnosis not present

## 2020-12-23 ENCOUNTER — Telehealth: Payer: Self-pay

## 2020-12-23 DIAGNOSIS — E782 Mixed hyperlipidemia: Secondary | ICD-10-CM

## 2020-12-23 MED ORDER — REPATHA SURECLICK 140 MG/ML ~~LOC~~ SOAJ: 140.0000 mg | SUBCUTANEOUS | 3 refills | Status: DC

## 2020-12-23 NOTE — Telephone Encounter (Signed)
Called and spoke w/pt, stated that they were approved for repatha [employer], rx sent, pt instructed to complete fasting labs post 4th dose, pt voiced undersanding

## 2020-12-24 DIAGNOSIS — M7522 Bicipital tendinitis, left shoulder: Secondary | ICD-10-CM | POA: Diagnosis not present

## 2020-12-24 DIAGNOSIS — M79602 Pain in left arm: Secondary | ICD-10-CM | POA: Diagnosis not present

## 2020-12-24 DIAGNOSIS — M24112 Other articular cartilage disorders, left shoulder: Secondary | ICD-10-CM | POA: Diagnosis not present

## 2020-12-24 DIAGNOSIS — M542 Cervicalgia: Secondary | ICD-10-CM | POA: Diagnosis not present

## 2020-12-24 DIAGNOSIS — M25512 Pain in left shoulder: Secondary | ICD-10-CM | POA: Diagnosis not present

## 2020-12-24 DIAGNOSIS — R0789 Other chest pain: Secondary | ICD-10-CM | POA: Diagnosis not present

## 2020-12-27 DIAGNOSIS — M542 Cervicalgia: Secondary | ICD-10-CM | POA: Diagnosis not present

## 2020-12-27 DIAGNOSIS — R0789 Other chest pain: Secondary | ICD-10-CM | POA: Diagnosis not present

## 2020-12-27 DIAGNOSIS — M79602 Pain in left arm: Secondary | ICD-10-CM | POA: Diagnosis not present

## 2020-12-27 DIAGNOSIS — M7522 Bicipital tendinitis, left shoulder: Secondary | ICD-10-CM | POA: Diagnosis not present

## 2020-12-27 DIAGNOSIS — M25512 Pain in left shoulder: Secondary | ICD-10-CM | POA: Diagnosis not present

## 2020-12-27 DIAGNOSIS — M24112 Other articular cartilage disorders, left shoulder: Secondary | ICD-10-CM | POA: Diagnosis not present

## 2021-01-01 DIAGNOSIS — M7522 Bicipital tendinitis, left shoulder: Secondary | ICD-10-CM | POA: Diagnosis not present

## 2021-01-01 DIAGNOSIS — M24112 Other articular cartilage disorders, left shoulder: Secondary | ICD-10-CM | POA: Diagnosis not present

## 2021-01-01 DIAGNOSIS — M79602 Pain in left arm: Secondary | ICD-10-CM | POA: Diagnosis not present

## 2021-01-01 DIAGNOSIS — M25512 Pain in left shoulder: Secondary | ICD-10-CM | POA: Diagnosis not present

## 2021-01-01 DIAGNOSIS — R0789 Other chest pain: Secondary | ICD-10-CM | POA: Diagnosis not present

## 2021-01-01 DIAGNOSIS — M542 Cervicalgia: Secondary | ICD-10-CM | POA: Diagnosis not present

## 2021-01-03 DIAGNOSIS — M7522 Bicipital tendinitis, left shoulder: Secondary | ICD-10-CM | POA: Diagnosis not present

## 2021-01-03 DIAGNOSIS — M79602 Pain in left arm: Secondary | ICD-10-CM | POA: Diagnosis not present

## 2021-01-03 DIAGNOSIS — M24112 Other articular cartilage disorders, left shoulder: Secondary | ICD-10-CM | POA: Diagnosis not present

## 2021-01-03 DIAGNOSIS — R0789 Other chest pain: Secondary | ICD-10-CM | POA: Diagnosis not present

## 2021-01-03 DIAGNOSIS — M25512 Pain in left shoulder: Secondary | ICD-10-CM | POA: Diagnosis not present

## 2021-01-03 DIAGNOSIS — M542 Cervicalgia: Secondary | ICD-10-CM | POA: Diagnosis not present

## 2021-01-07 DIAGNOSIS — M542 Cervicalgia: Secondary | ICD-10-CM | POA: Diagnosis not present

## 2021-01-07 DIAGNOSIS — M7522 Bicipital tendinitis, left shoulder: Secondary | ICD-10-CM | POA: Diagnosis not present

## 2021-01-07 DIAGNOSIS — M24112 Other articular cartilage disorders, left shoulder: Secondary | ICD-10-CM | POA: Diagnosis not present

## 2021-01-07 DIAGNOSIS — M79602 Pain in left arm: Secondary | ICD-10-CM | POA: Diagnosis not present

## 2021-01-07 DIAGNOSIS — M25512 Pain in left shoulder: Secondary | ICD-10-CM | POA: Diagnosis not present

## 2021-01-07 DIAGNOSIS — R0789 Other chest pain: Secondary | ICD-10-CM | POA: Diagnosis not present

## 2021-01-09 DIAGNOSIS — M24112 Other articular cartilage disorders, left shoulder: Secondary | ICD-10-CM | POA: Diagnosis not present

## 2021-01-09 DIAGNOSIS — M79602 Pain in left arm: Secondary | ICD-10-CM | POA: Diagnosis not present

## 2021-01-09 DIAGNOSIS — M25512 Pain in left shoulder: Secondary | ICD-10-CM | POA: Diagnosis not present

## 2021-01-09 DIAGNOSIS — M542 Cervicalgia: Secondary | ICD-10-CM | POA: Diagnosis not present

## 2021-01-09 DIAGNOSIS — R0789 Other chest pain: Secondary | ICD-10-CM | POA: Diagnosis not present

## 2021-01-09 DIAGNOSIS — M7522 Bicipital tendinitis, left shoulder: Secondary | ICD-10-CM | POA: Diagnosis not present

## 2021-01-14 DIAGNOSIS — M79602 Pain in left arm: Secondary | ICD-10-CM | POA: Diagnosis not present

## 2021-01-14 DIAGNOSIS — M25512 Pain in left shoulder: Secondary | ICD-10-CM | POA: Diagnosis not present

## 2021-01-14 DIAGNOSIS — R0789 Other chest pain: Secondary | ICD-10-CM | POA: Diagnosis not present

## 2021-01-14 DIAGNOSIS — M24112 Other articular cartilage disorders, left shoulder: Secondary | ICD-10-CM | POA: Diagnosis not present

## 2021-01-14 DIAGNOSIS — M7522 Bicipital tendinitis, left shoulder: Secondary | ICD-10-CM | POA: Diagnosis not present

## 2021-01-14 DIAGNOSIS — M542 Cervicalgia: Secondary | ICD-10-CM | POA: Diagnosis not present

## 2021-01-17 DIAGNOSIS — M25622 Stiffness of left elbow, not elsewhere classified: Secondary | ICD-10-CM | POA: Diagnosis not present

## 2021-01-17 DIAGNOSIS — R0789 Other chest pain: Secondary | ICD-10-CM | POA: Diagnosis not present

## 2021-01-17 DIAGNOSIS — M542 Cervicalgia: Secondary | ICD-10-CM | POA: Diagnosis not present

## 2021-01-17 DIAGNOSIS — M24112 Other articular cartilage disorders, left shoulder: Secondary | ICD-10-CM | POA: Diagnosis not present

## 2021-01-17 DIAGNOSIS — R293 Abnormal posture: Secondary | ICD-10-CM | POA: Diagnosis not present

## 2021-01-17 DIAGNOSIS — M25612 Stiffness of left shoulder, not elsewhere classified: Secondary | ICD-10-CM | POA: Diagnosis not present

## 2021-01-17 DIAGNOSIS — M25512 Pain in left shoulder: Secondary | ICD-10-CM | POA: Diagnosis not present

## 2021-01-17 DIAGNOSIS — M2560 Stiffness of unspecified joint, not elsewhere classified: Secondary | ICD-10-CM | POA: Diagnosis not present

## 2021-01-17 DIAGNOSIS — M79602 Pain in left arm: Secondary | ICD-10-CM | POA: Diagnosis not present

## 2021-01-17 DIAGNOSIS — M7522 Bicipital tendinitis, left shoulder: Secondary | ICD-10-CM | POA: Diagnosis not present

## 2021-01-17 DIAGNOSIS — M6281 Muscle weakness (generalized): Secondary | ICD-10-CM | POA: Diagnosis not present

## 2021-01-21 DIAGNOSIS — M25612 Stiffness of left shoulder, not elsewhere classified: Secondary | ICD-10-CM | POA: Diagnosis not present

## 2021-01-21 DIAGNOSIS — M542 Cervicalgia: Secondary | ICD-10-CM | POA: Diagnosis not present

## 2021-01-21 DIAGNOSIS — M25512 Pain in left shoulder: Secondary | ICD-10-CM | POA: Diagnosis not present

## 2021-01-21 DIAGNOSIS — M24112 Other articular cartilage disorders, left shoulder: Secondary | ICD-10-CM | POA: Diagnosis not present

## 2021-01-21 DIAGNOSIS — M7522 Bicipital tendinitis, left shoulder: Secondary | ICD-10-CM | POA: Diagnosis not present

## 2021-01-21 DIAGNOSIS — M79602 Pain in left arm: Secondary | ICD-10-CM | POA: Diagnosis not present

## 2021-01-23 DIAGNOSIS — M25512 Pain in left shoulder: Secondary | ICD-10-CM | POA: Diagnosis not present

## 2021-01-23 DIAGNOSIS — M24112 Other articular cartilage disorders, left shoulder: Secondary | ICD-10-CM | POA: Diagnosis not present

## 2021-01-23 DIAGNOSIS — M7522 Bicipital tendinitis, left shoulder: Secondary | ICD-10-CM | POA: Diagnosis not present

## 2021-01-23 DIAGNOSIS — M79602 Pain in left arm: Secondary | ICD-10-CM | POA: Diagnosis not present

## 2021-01-23 DIAGNOSIS — M25612 Stiffness of left shoulder, not elsewhere classified: Secondary | ICD-10-CM | POA: Diagnosis not present

## 2021-01-23 DIAGNOSIS — M542 Cervicalgia: Secondary | ICD-10-CM | POA: Diagnosis not present

## 2021-01-28 ENCOUNTER — Inpatient Hospital Stay: Payer: Medicare Other | Attending: Oncology | Admitting: Oncology

## 2021-01-28 ENCOUNTER — Other Ambulatory Visit: Payer: Self-pay | Admitting: Oncology

## 2021-01-28 ENCOUNTER — Telehealth: Payer: Self-pay | Admitting: Oncology

## 2021-01-28 ENCOUNTER — Encounter: Payer: Self-pay | Admitting: Oncology

## 2021-01-28 ENCOUNTER — Other Ambulatory Visit: Payer: Self-pay

## 2021-01-28 VITALS — BP 152/67 | HR 69 | Temp 98.1°F | Resp 18 | Ht 61.0 in | Wt 119.4 lb

## 2021-01-28 DIAGNOSIS — M25612 Stiffness of left shoulder, not elsewhere classified: Secondary | ICD-10-CM | POA: Diagnosis not present

## 2021-01-28 DIAGNOSIS — G8912 Acute post-thoracotomy pain: Secondary | ICD-10-CM

## 2021-01-28 DIAGNOSIS — Z1239 Encounter for other screening for malignant neoplasm of breast: Secondary | ICD-10-CM

## 2021-01-28 DIAGNOSIS — Z170 Estrogen receptor positive status [ER+]: Secondary | ICD-10-CM | POA: Diagnosis not present

## 2021-01-28 DIAGNOSIS — M25512 Pain in left shoulder: Secondary | ICD-10-CM | POA: Diagnosis not present

## 2021-01-28 DIAGNOSIS — Z1231 Encounter for screening mammogram for malignant neoplasm of breast: Secondary | ICD-10-CM

## 2021-01-28 DIAGNOSIS — M7522 Bicipital tendinitis, left shoulder: Secondary | ICD-10-CM | POA: Diagnosis not present

## 2021-01-28 DIAGNOSIS — C50412 Malignant neoplasm of upper-outer quadrant of left female breast: Secondary | ICD-10-CM

## 2021-01-28 DIAGNOSIS — M24112 Other articular cartilage disorders, left shoulder: Secondary | ICD-10-CM | POA: Diagnosis not present

## 2021-01-28 DIAGNOSIS — M79602 Pain in left arm: Secondary | ICD-10-CM | POA: Diagnosis not present

## 2021-01-28 DIAGNOSIS — M542 Cervicalgia: Secondary | ICD-10-CM | POA: Diagnosis not present

## 2021-01-28 MED ORDER — AMITRIPTYLINE HCL 50 MG PO TABS: 50.0000 mg | ORAL_TABLET | Freq: Every day | ORAL | 5 refills | Status: DC

## 2021-01-28 NOTE — Telephone Encounter (Signed)
Per 7/12 los next appt scheduled and given to patient

## 2021-01-28 NOTE — Progress Notes (Deleted)
[facility]  [address] [place],  [place]  [address] [phone]  Clinic Day:  01/28/2021  Referring physician: Maury Dus, MD   This document serves as a record of services personally performed by Hosie Poisson, MD. It was created on their behalf by Curry,Lauren E, a trained medical scribe. The creation of this record is based on the scribe's personal observations and the provider's statements to them.   CHIEF COMPLAINT:  CC: History of stage 0 left breast cancer  Current Treatment:  Surveillance   HISTORY OF PRESENT ILLNESS:  Faith Gaines is a 75 y.o. female with a history of stage 0 left breast cancer diagnosed in November of 2014 and treated with a lumpectomy in February of 2015. 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  This measured 0.1 cm for a Tis N0 N0.  She was offered chemoprevention but declined because of many other comorbidities.  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  She does have osteopenia and her last bone density scan was done in January of 2015. She gets these done through her gynecologist in [place].  In April of 2016, she had a total abdominal hysterectomy and bilateral salpingo-oophorectomy.  She did have a iron deficiency anemia in the past, but this corrected.  She tells me that she met with her primary care physician in December of 2016, and she was anemic again with a hemoglobin down to 11.  She was found to have iron deficiency.  She did complain of occasional soreness of her tongue, dizziness, and craving for ice.  She was placed on slow release iron after she was not able to tolerate ferrous sulfate.  She was fatigued and weak with dyspnea on exertion, and had daily headaches as well.  She had severe bleeding from the rectum which she attributes to hemorrhoids. Studies still showed evidence of iron  deficiency and so she was given 2 injections of Feraheme at the end of December  2016, 1 week apart.  She has had endoscopy and colonoscopy through Dr. Collene Mares in [place] several years ago, so she was referred back, and had these repeated in January of 2017.  EGD revealed diffuse moderate gastritis and colonoscopy revealed hemorrhoids.  She was told to avoid all nonsteroidal anti-inflammatory medications.    She has been diagnosed with lichen planopilaris and is using a liquid steroid from the dermatologist in [place], and topical Rogaine.  They referred her to [facility], and they felt this was immune mediated and placed her on hydroxychloroquine 200 mg twice daily.  She feels this is helping some.  She had a biopsy in the past of her right upper arm by a dermatologist in [place], Dr. Renda Rolls, and was told this was "pre melanoma".  She had a bone density scan 2 years ago, which apparently was not as good as previous.  She underwent triple bypass surgery in June 2021 and her hemoglobin was 9.0 at the last check with her cardiologist.  They took veins from her left leg and left chest.  Since her surgery, she has had pain within that left breast.  When she was seen in September 2021, she was still having a lot of post-thoracotomy pain and so I placed her on a low dose of Elavil.  INTERVAL HISTORY:  Faith Gaines is here for routine follow up and continues to have intermittent pain  of her central sternal chest wall and left breast which she rates as a 9/10 today.  We had tried her on Elavil 25 mg at bedtime, but she states that this made her drowsy.  Dr. Kipp Brood has placed her on Lyrica 25 mg twice daily as of last week, and so I advised that she discontinue the Elavil.  She reports fatigue.  CT chest from January 7th revealed mild emphysematous changes but no acute pulmonary findings or worrisome pulmonary lesions.  No mediastinal or hilar mass or adenopathy was observed.  We reviewed these images  today.  She had blood work today at Dr. Noland Fordyce office.  Her  appetite is good, and she has gained 7 pounds since her last visit.  She denies fever, chills or other signs of infection.  She denies nausea, vomiting, bowel issues, or abdominal pain.  She denies sore throat, cough, dyspnea, or chest pain.  REVIEW OF SYSTEMS:  Review of Systems  Constitutional:  Positive for fatigue.  HENT:  Negative.    Eyes: Negative.   Respiratory: Negative.    Cardiovascular: Negative.   Gastrointestinal: Negative.   Endocrine: Negative.   Genitourinary: Negative.    Musculoskeletal:  Positive for arthralgias.       Persistent pain of the central sternal chest wall and left breast, intermittent  Skin: Negative.   Neurological: Negative.   Hematological: Negative.   Psychiatric/Behavioral: Negative.      VITALS:  There were no vitals taken for this visit.  Wt Readings from Last 3 Encounters:  12/18/20 118 lb (53.5 kg)  11/26/20 115 lb 14.4 oz (52.6 kg)  11/11/20 118 lb 9.6 oz (53.8 kg)    There is no height or weight on file to calculate BMI.  Performance status (ECOG): 1 - Symptomatic but completely ambulatory  PHYSICAL EXAM:  Physical Exam Constitutional:      General: She is not in acute distress.    Appearance: Normal appearance. She is normal weight.  HENT:     Head: Normocephalic and atraumatic.  Eyes:     General: No scleral icterus.    Extraocular Movements: Extraocular movements intact.     Conjunctiva/sclera: Conjunctivae normal.     Pupils: Pupils are equal, round, and reactive to light.  Cardiovascular:     Rate and Rhythm: Normal rate and regular rhythm.     Pulses: Normal pulses.     Heart sounds: Normal heart sounds. No murmur heard.   No friction rub. No gallop.  Pulmonary:     Effort: Pulmonary effort is normal. No respiratory distress.     Breath sounds: Normal breath sounds.  Chest:     Comments: Both breasts are without masses.  She does have tenderness in the  upper inner quadrant of the left breast.  The sternotomy incision is well healed. Abdominal:     General: Bowel sounds are normal. There is no distension.     Palpations: Abdomen is soft. There is no mass.     Tenderness: There is no abdominal tenderness.  Musculoskeletal:        General: Normal range of motion.     Cervical back: Normal range of motion and neck supple.     Right lower leg: No edema.     Left lower leg: No edema.  Lymphadenopathy:     Cervical: No cervical adenopathy.  Skin:    General: Skin is warm and dry.  Neurological:     General: No focal deficit present.  Mental Status: She is alert and oriented to person, place, and time. Mental status is at baseline.  Psychiatric:        Mood and Affect: Mood normal.        Behavior: Behavior normal.        Thought Content: Thought content normal.        Judgment: Judgment normal.    LABS:   CBC Latest Ref Rng & Units 11/26/2020 05/29/2020 02/04/2020  WBC 4.0 - 10.5 K/uL - 10.0 10.7(H)  Hemoglobin 12.0 - 15.0 g/dL 16.7(H) 16.0(H) 9.1(L)  Hematocrit 36.0 - 46.0 % 49.0(H) 49.5(H) 30.2(L)  Platelets 150 - 400 K/uL - 247 149(L)   CMP Latest Ref Rng & Units 11/26/2020 05/29/2020 02/04/2020  Glucose 70 - 99 mg/dL 152(H) 143(H) 134(H)  BUN 8 - 23 mg/dL $Remove'18 16 12  'ivLbXch$ Creatinine 0.44 - 1.00 mg/dL 0.80 0.99 0.74  Sodium 135 - 145 mmol/L 140 140 139  Potassium 3.5 - 5.1 mmol/L 4.4 4.7 4.7  Chloride 98 - 111 mmol/L 105 106 108  CO2 22 - 32 mmol/L - 24 23  Calcium 8.9 - 10.3 mg/dL - 10.5(H) 9.9  Total Protein 6.5 - 8.1 g/dL - 7.5 -  Total Bilirubin 0.3 - 1.2 mg/dL - 0.6 -  Alkaline Phos 38 - 126 U/L - 68 -  AST 15 - 41 U/L - 15 -  ALT 0 - 44 U/L - 13 -    Lab Results  Component Value Date   TIBC 372 05/29/2020   FERRITIN 99 05/29/2020   IRONPCTSAT 20 05/29/2020     STUDIES:  No results found.    Allergies:  Allergies  Allergen Reactions   Codeine Nausea And Vomiting and Other (See Comments)    And Headache     Ace Inhibitors     Drop in GFR   Clarithromycin     Unable to sleep    Crestor [Rosuvastatin]     Body aches   Hydroxychloroquine Sulfate Other (See Comments)    Per pt is has been so long ago, unsure reaction   Lipitor [Atorvastatin]     Body aches   Avandamet [Rosiglitazone-Metformin] Rash and Other (See Comments)    Makes her hyper   Betadine [Povidone Iodine] Rash    Current Medications: Current Outpatient Medications  Medication Sig Dispense Refill   acetaminophen (TYLENOL) 500 MG tablet Take 2 tablets (1,000 mg total) by mouth every 6 (six) hours as needed for mild pain or moderate pain. 80 tablet 0   amitriptyline (ELAVIL) 25 MG tablet TAKE 1 TABLET BY MOUTH EVERYDAY AT BEDTIME (Patient taking differently: Take by mouth at bedtime.) 90 tablet 2   amLODipine (NORVASC) 5 MG tablet Take 5 mg by mouth daily.     aspirin 81 MG tablet Take 1 tablet (81 mg total) by mouth daily. (Patient not taking: Reported on 11/21/2020)     Biotin 5000 MCG CAPS Take 2 capsules by mouth daily.     Blood Glucose Monitoring Suppl (FREESTYLE INSULINX SYSTEM) w/Device KIT See admin instructions.     canagliflozin (INVOKANA) 300 MG TABS tablet Take 300 mg by mouth daily.     Evolocumab (REPATHA SURECLICK) 412 MG/ML SOAJ Inject 140 mg into the skin every 14 (fourteen) days. 6 mL 3   famotidine (PEPCID) 40 MG tablet Take 40 mg by mouth at bedtime.     HYDROcodone-acetaminophen (NORCO) 10-325 MG tablet Take 1 tablet by mouth every 6 (six) hours as needed for severe pain. 20 tablet  0   methocarbamol (ROBAXIN) 500 MG tablet Take 1 tablet (500 mg total) by mouth every 8 (eight) hours as needed for muscle spasms. 20 tablet 0   Multiple Vitamins-Minerals (PRESERVISION AREDS 2 PO) Take 1 tablet by mouth daily.     nebivolol (BYSTOLIC) 5 MG tablet Take 5 mg by mouth daily. Takes with $RemoveBe'20mg'omoPnSMbu$  to total $Remove'25mg'LtDOwqF$      Nebivolol HCl (BYSTOLIC) 20 MG TABS Take 1 tablet by mouth daily. Takes with 5 mg to total 25 mg     Omega-3  Fatty Acids (FISH OIL) 1000 MG CAPS Take 1,000 mg by mouth 2 (two) times daily.     ondansetron (ZOFRAN) 4 MG tablet Take 1 tablet (4 mg total) by mouth daily as needed for nausea or vomiting. 10 tablet 0   pantoprazole (PROTONIX) 40 MG tablet Take 40 mg by mouth daily.     Pitavastatin Calcium (LIVALO) 2 MG TABS Take 2 mg by mouth 2 (two) times a week.     pregabalin (LYRICA) 25 MG capsule Take 1 capsule (25 mg total) by mouth 2 (two) times daily. 180 capsule 3   Saxagliptin-Metformin 2.11-998 MG TB24 Take 1 tablet by mouth 2 (two) times daily.     VITAMIN D PO Take 1,000 Units by mouth daily.     vitamin E 1000 UNIT capsule Take 1,000 Units by mouth daily.     No current facility-administered medications for this visit.     ASSESSMENT & PLAN:   Assessment:  1. Stage 0 breast cancer November 2014, treated with lumpectomy.  She has no evidence of disease.  2. Severe iron deficiency in the past, treated with intravenous supplement.  She had iron deficiency again in September 2021, treated with IV iron.  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  4. Hypercalcemia, stable, with normal vitamin-D level.   5. History of malignant melanoma resected from her left leg at age 47. No evidence of disease.  6. History of "pre melanoma" of her right upper arm resected.  The pathology revealed an inflamed junctional nevus with moderate atypia.  7. Osteopenia.  8. History of total abdominal hysterectomy and bilateral salpingo-oophorectomy.  9.  Triple bypass surgery in June 2021.  She completed cardiac rehab.  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  I tried her on Elavil 25 mg at bedtime, but this made her drowsy, so it will be stopped.  She now has been placed on Lyrica 25 mg twice daily and it is too soon to tell, but I think it will help her, she may need higher dosing.  Plan: As she is now on Lyrica 25 mg BID for her chest  wall and left breast pain, I advised that she discontinue Elavil 25 mg at bedtime.  We will hold off on mammography for now with her pain as she recently had CT chest imaging and this was clear.   I reviewed these images with her today.  We will plan to see her back in 6 months with bilateral mammogram for reexamiantion.  She will contact us if she feels she cannot tolerate mammography at that time.  I will be glad to see her back if problems arise regarding her breast cancer or her iron deficiency.  She understands and agrees with this plan of care.   I provided 30 minutes of face-to-face time during this this encounter and > 50% was spent counseling as documented under my assessment and plan.  Derwood Kaplan, MD [facility] AT [facility] [address] [place] [address] Dept: [phone] Dept Fax: [phone]   I, Rita Ohara, am acting as scribe for Derwood Kaplan, MD  I have reviewed this report as typed by the medical scribe, and it is complete and accurate.

## 2021-01-28 NOTE — Progress Notes (Signed)
[facility]  [address] [place],  [place]  [address] [phone]  Clinic Day:  01/28/2021  Referring physician: Maury Dus, MD   This document serves as a record of services personally performed by Hosie Poisson, MD. It was created on their behalf by Curry,Lauren E, a trained medical scribe. The creation of this record is based on the scribe's personal observations and the provider's statements to them.  CHIEF COMPLAINT:  CC: History of stage 0 left breast cancer  Current Treatment:  Surveillance  HISTORY OF PRESENT ILLNESS:  Faith Gaines is a 75 y.o. female with a history of stage 0 left breast cancer diagnosed in November of 2014 and treated with a lumpectomy in February of 2015. 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  This measured 0.1 cm for a Tis N0 N0.  She was offered chemoprevention but declined because of many other comorbidities.  She also has a history of a melanoma of the left leg resected at age 56.  She does have osteopenia and her last bone density scan was done in January of 2015. She gets these done through her gynecologist in [place].  In April of 2016, she had a total abdominal hysterectomy and bilateral salpingo-oophorectomy.  She did have a iron deficiency anemia in the past, but this corrected.  She tells me that she met with her primary care physician in December of 2016, and she was anemic again with a hemoglobin down to 11.  She was found to have iron deficiency.  She did complain of occasional soreness of her tongue, dizziness, and craving for ice.  She was placed on slow release iron after she was not able to tolerate ferrous sulfate.  She was fatigued and weak with dyspnea on exertion, and had daily headaches as well.  She had severe bleeding from the rectum which she attributes to hemorrhoids. Studies still showed evidence of iron deficiency  and so she was given 2 injections of Feraheme at the end of December  2016, 1 week apart.  She has had endoscopy and colonoscopy through Dr. Collene Mares in [place] several years ago, so she was referred back, and had these repeated in January of 2017.  EGD revealed diffuse moderate gastritis and colonoscopy revealed hemorrhoids.  She was told to avoid all nonsteroidal anti-inflammatory medications.    She has been diagnosed with lichen planopilaris and is using a liquid steroid from the dermatologist in [place], and topical Rogaine.  They referred her to [facility], and they felt this was immune mediated and placed her on hydroxychloroquine 200 mg twice daily.  She feels this is helping some.  She had a biopsy in the past of her right upper arm by a dermatologist in [place], Dr. Renda Rolls, and was told this was "pre melanoma".  She had a bone density scan 2 years ago, which apparently was not as good as previous.    She underwent triple bypass surgery in June 2021 and her hemoglobin was 9.0 at the last check with her cardiologist.  They took veins from her left leg and left chest.  Since her surgery, she has had pain within that left breast and along the incision.  When she was seen in September 2021, she was still having a lot of post-thoracotomy pain and so I placed her on a low dose of Elavil 25 mg.  She reported drowsiness.  Dr. Kipp Brood has placed her on  Lyrica 25 mg BID.  CT chest from January 2022 revealed mild emphysematous changes but no acute pulmonary findings or worrisome pulmonary lesions.  No mediastinal or hilar mass or adenopathy was observed.   INTERVAL HISTORY:  Faith Gaines is here for routine follow up and states that she has been fairly well overall.  She did have left shoulder surgery in May for a torn tendon, and continues rehab twice weekly.  She rates her pain as a 4/10 today of the left shoulder.  She continues to have intermittent chest wall pain of the sternotomy incision  despite Lyrica 25 mg BID and Elavil 25 mg at bedtime.  I will increase the Elavil to 50 mg.  She has been using mainly Tylenol, and then ibuprofen when her pain is severe.  She has been advised to massage around the scar.  With her recent surgery and her chest wall pain, she does not feel she can tolerate mammography at this time.  Her last one was in November 2020.  She continues routine blood work with her primary care physician, and is scheduled next week.  Her  appetite is good, and she has gained 1 and 1/2 pounds since her last visit.  She denies fever, chills or other signs of infection.  She denies nausea, vomiting, bowel issues, or abdominal pain.  She denies sore throat, cough, dyspnea, or chest pain.  REVIEW OF SYSTEMS:  Review of Systems  Constitutional: Negative.  Negative for appetite change, chills, fatigue, fever and unexpected weight change.  HENT:  Negative.    Eyes: Negative.   Respiratory: Negative.  Negative for chest tightness, cough, hemoptysis, shortness of breath and wheezing.   Cardiovascular:  Negative for chest pain, leg swelling and palpitations.  Gastrointestinal: Negative.  Negative for abdominal distention, abdominal pain, blood in stool, constipation, diarrhea, nausea and vomiting.  Endocrine: Negative.   Genitourinary: Negative.  Negative for difficulty urinating, dysuria, frequency and hematuria.   Musculoskeletal: Negative.  Negative for arthralgias, back pain, flank pain, gait problem and myalgias.       Chest wall pain from sternotomy incision; Left shoulder pain   Skin: Negative.   Neurological: Negative.  Negative for dizziness, extremity weakness, gait problem, headaches, light-headedness, numbness, seizures and speech difficulty.  Hematological: Negative.   Psychiatric/Behavioral: Negative.  Negative for depression and sleep disturbance. The patient is not nervous/anxious.     VITALS:  Blood pressure (!) 152/67, pulse 69, temperature 98.1 F (36.7 C),  temperature source Oral, resp. rate 18, height 5' 1"  (1.549 m), weight 119 lb 6.4 oz (54.2 kg), SpO2 97 %.  Wt Readings from Last 3 Encounters:  01/28/21 119 lb 6.4 oz (54.2 kg)  12/18/20 118 lb (53.5 kg)  11/26/20 115 lb 14.4 oz (52.6 kg)    Body mass index is 22.56 kg/m.  Performance status (ECOG): 1 - Symptomatic but completely ambulatory  PHYSICAL EXAM:  Physical Exam Constitutional:      General: She is not in acute distress.    Appearance: Normal appearance. She is normal weight.  HENT:     Head: Normocephalic and atraumatic.  Eyes:     General: No scleral icterus.    Extraocular Movements: Extraocular movements intact.     Conjunctiva/sclera: Conjunctivae normal.     Pupils: Pupils are equal, round, and reactive to light.  Cardiovascular:     Rate and Rhythm: Normal rate and regular rhythm.     Pulses: Normal pulses.     Heart sounds: Normal heart sounds. No murmur  heard.   No friction rub. No gallop.  Pulmonary:     Effort: Pulmonary effort is normal. No respiratory distress.     Breath sounds: Normal breath sounds.  Chest:  Breasts:    Right: Normal.     Left: Normal.     Comments: Bilateral breasts are without masses Abdominal:     General: Bowel sounds are normal. There is no distension.     Palpations: Abdomen is soft. There is no hepatomegaly, splenomegaly or mass.     Tenderness: There is no abdominal tenderness.  Musculoskeletal:        General: Normal range of motion.     Cervical back: Normal range of motion and neck supple.     Right lower leg: No edema.     Left lower leg: No edema.  Lymphadenopathy:     Cervical: No cervical adenopathy.  Skin:    General: Skin is warm and dry.  Neurological:     General: No focal deficit present.     Mental Status: She is alert and oriented to person, place, and time. Mental status is at baseline.  Psychiatric:        Mood and Affect: Mood normal.        Behavior: Behavior normal.        Thought Content:  Thought content normal.        Judgment: Judgment normal.    LABS:   CBC Latest Ref Rng & Units 11/26/2020 05/29/2020 02/04/2020  WBC 4.0 - 10.5 K/uL - 10.0 10.7(H)  Hemoglobin 12.0 - 15.0 g/dL 16.7(H) 16.0(H) 9.1(L)  Hematocrit 36.0 - 46.0 % 49.0(H) 49.5(H) 30.2(L)  Platelets 150 - 400 K/uL - 247 149(L)   CMP Latest Ref Rng & Units 11/26/2020 05/29/2020 02/04/2020  Glucose 70 - 99 mg/dL 152(H) 143(H) 134(H)  BUN 8 - 23 mg/dL 18 16 12   Creatinine 0.44 - 1.00 mg/dL 0.80 0.99 0.74  Sodium 135 - 145 mmol/L 140 140 139  Potassium 3.5 - 5.1 mmol/L 4.4 4.7 4.7  Chloride 98 - 111 mmol/L 105 106 108  CO2 22 - 32 mmol/L - 24 23  Calcium 8.9 - 10.3 mg/dL - 10.5(H) 9.9  Total Protein 6.5 - 8.1 g/dL - 7.5 -  Total Bilirubin 0.3 - 1.2 mg/dL - 0.6 -  Alkaline Phos 38 - 126 U/L - 68 -  AST 15 - 41 U/L - 15 -  ALT 0 - 44 U/L - 13 -    Lab Results  Component Value Date   TIBC 372 05/29/2020   FERRITIN 99 05/29/2020   IRONPCTSAT 20 05/29/2020     STUDIES:  No results found.    Allergies:  Allergies  Allergen Reactions   Codeine Nausea And Vomiting and Other (See Comments)    And Headache    Ace Inhibitors     Drop in GFR   Clarithromycin     Unable to sleep    Crestor [Rosuvastatin]     Body aches   Hydroxychloroquine Sulfate Other (See Comments)    Per pt is has been so long ago, unsure reaction   Lipitor [Atorvastatin]     Body aches   Avandamet [Rosiglitazone-Metformin] Rash and Other (See Comments)    Makes her hyper   Betadine [Povidone Iodine] Rash    Current Medications: Current Outpatient Medications  Medication Sig Dispense Refill   acetaminophen (TYLENOL) 500 MG tablet Take 2 tablets (1,000 mg total) by mouth every 6 (six) hours as needed for mild pain  or moderate pain. 80 tablet 0   amitriptyline (ELAVIL) 25 MG tablet TAKE 1 TABLET BY MOUTH EVERYDAY AT BEDTIME (Patient taking differently: Take by mouth at bedtime.) 90 tablet 2   amLODipine (NORVASC) 5 MG tablet  Take 5 mg by mouth daily.     aspirin 81 MG tablet Take 1 tablet (81 mg total) by mouth daily.     Biotin 5000 MCG CAPS Take 2 capsules by mouth daily.     Blood Glucose Monitoring Suppl (FREESTYLE INSULINX SYSTEM) w/Device KIT See admin instructions.     canagliflozin (INVOKANA) 300 MG TABS tablet Take 300 mg by mouth daily.     Evolocumab (REPATHA SURECLICK) 553 MG/ML SOAJ Inject 140 mg into the skin every 14 (fourteen) days. 6 mL 3   famotidine (PEPCID) 40 MG tablet Take 40 mg by mouth at bedtime.     HYDROcodone-acetaminophen (NORCO) 10-325 MG tablet Take 1 tablet by mouth every 6 (six) hours as needed for severe pain. 20 tablet 0   methocarbamol (ROBAXIN) 500 MG tablet Take 1 tablet (500 mg total) by mouth every 8 (eight) hours as needed for muscle spasms. 20 tablet 0   Multiple Vitamins-Minerals (PRESERVISION AREDS 2 PO) Take 1 tablet by mouth daily.     nebivolol (BYSTOLIC) 5 MG tablet Take 5 mg by mouth daily. Takes with 58m to total 234m    Nebivolol HCl (BYSTOLIC) 20 MG TABS Take 1 tablet by mouth daily. Takes with 5 mg to total 25 mg     Omega-3 Fatty Acids (FISH OIL) 1000 MG CAPS Take 1,000 mg by mouth 2 (two) times daily.     ondansetron (ZOFRAN) 4 MG tablet Take 1 tablet (4 mg total) by mouth daily as needed for nausea or vomiting. 10 tablet 0   pantoprazole (PROTONIX) 40 MG tablet Take 40 mg by mouth daily.     Pitavastatin Calcium (LIVALO) 2 MG TABS Take 2 mg by mouth 2 (two) times a week.     pregabalin (LYRICA) 25 MG capsule Take 1 capsule (25 mg total) by mouth 2 (two) times daily. 180 capsule 3   Saxagliptin-Metformin 2.11-998 MG TB24 Take 1 tablet by mouth 2 (two) times daily.     VITAMIN D PO Take 1,000 Units by mouth daily.     vitamin E 1000 UNIT capsule Take 1,000 Units by mouth daily.     No current facility-administered medications for this visit.     ASSESSMENT & PLAN:   Assessment:  1. Stage 0 breast cancer November 2014, treated with lumpectomy.  She has no  evidence of disease.  2. Severe iron deficiency in the past, treated with intravenous supplement.  She had iron deficiency again in September 2021, treated with IV iron.  This has now resolved.  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  4. Hypercalcemia, stable, with normal vitamin-D level.   5. History of malignant melanoma resected from her left leg at age [age]No evidence of disease.  6. History of "pre melanoma" of her right upper arm resected.  The pathology revealed an inflamed junctional nevus with moderate atypia.  7. Osteopenia.  8. History of total abdominal hysterectomy and bilateral salpingo-oophorectomy.  9.  Triple bypass surgery in June 2021.  She completed cardiac rehab.  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  I tried her on Elavil 25 mg at bedtime, and she was placed on Lyrica 25 mg twice daily through Dr. LiKipp Brood She continues  to have pain and so I will increase the Elavil to 50 mg at bedtime.  10.  Left shoulder surgery for a torn tendon, May 2022.  She continues rehab twice a week.  Plan: We will hold off on mammography for now with her pain and recent surgery.  I will increase her Elavil dose to 50 mg at bedtime to try and improve her neuropathic pain and she will continue Lyrica 25 mg BID.  We will plan to see her back in 5 months with possible bilateral mammogram for reexamiantion.  She will contact us if she feels she cannot tolerate mammography at that time.  I will be glad to see her back if problems arise regarding her breast cancer.  She understands and agrees with this plan of care.   I provided 25 minutes of face-to-face time during this this encounter and > 50% was spent counseling as documented under my assessment and plan.    Derwood Kaplan, MD [facility] [address] [place] [address] Dept:  [phone] Dept Fax: [phone]   I, Rita Ohara, am acting as scribe for Derwood Kaplan, MD  I have reviewed this report as typed by the medical scribe, and it is complete and accurate.

## 2021-01-30 DIAGNOSIS — M25612 Stiffness of left shoulder, not elsewhere classified: Secondary | ICD-10-CM | POA: Diagnosis not present

## 2021-01-30 DIAGNOSIS — M542 Cervicalgia: Secondary | ICD-10-CM | POA: Diagnosis not present

## 2021-01-30 DIAGNOSIS — M7522 Bicipital tendinitis, left shoulder: Secondary | ICD-10-CM | POA: Diagnosis not present

## 2021-01-30 DIAGNOSIS — M79602 Pain in left arm: Secondary | ICD-10-CM | POA: Diagnosis not present

## 2021-01-30 DIAGNOSIS — M24112 Other articular cartilage disorders, left shoulder: Secondary | ICD-10-CM | POA: Diagnosis not present

## 2021-01-30 DIAGNOSIS — M25512 Pain in left shoulder: Secondary | ICD-10-CM | POA: Diagnosis not present

## 2021-02-03 DIAGNOSIS — M25612 Stiffness of left shoulder, not elsewhere classified: Secondary | ICD-10-CM | POA: Diagnosis not present

## 2021-02-03 DIAGNOSIS — M25512 Pain in left shoulder: Secondary | ICD-10-CM | POA: Diagnosis not present

## 2021-02-03 DIAGNOSIS — M7522 Bicipital tendinitis, left shoulder: Secondary | ICD-10-CM | POA: Diagnosis not present

## 2021-02-03 DIAGNOSIS — M542 Cervicalgia: Secondary | ICD-10-CM | POA: Diagnosis not present

## 2021-02-03 DIAGNOSIS — M24112 Other articular cartilage disorders, left shoulder: Secondary | ICD-10-CM | POA: Diagnosis not present

## 2021-02-03 DIAGNOSIS — M79602 Pain in left arm: Secondary | ICD-10-CM | POA: Diagnosis not present

## 2021-02-04 DIAGNOSIS — E782 Mixed hyperlipidemia: Secondary | ICD-10-CM | POA: Diagnosis not present

## 2021-02-04 DIAGNOSIS — I129 Hypertensive chronic kidney disease with stage 1 through stage 4 chronic kidney disease, or unspecified chronic kidney disease: Secondary | ICD-10-CM | POA: Diagnosis not present

## 2021-02-04 DIAGNOSIS — J439 Emphysema, unspecified: Secondary | ICD-10-CM | POA: Diagnosis not present

## 2021-02-04 DIAGNOSIS — K219 Gastro-esophageal reflux disease without esophagitis: Secondary | ICD-10-CM | POA: Diagnosis not present

## 2021-02-04 DIAGNOSIS — N1831 Chronic kidney disease, stage 3a: Secondary | ICD-10-CM | POA: Diagnosis not present

## 2021-02-04 DIAGNOSIS — D509 Iron deficiency anemia, unspecified: Secondary | ICD-10-CM | POA: Diagnosis not present

## 2021-02-04 DIAGNOSIS — I739 Peripheral vascular disease, unspecified: Secondary | ICD-10-CM | POA: Diagnosis not present

## 2021-02-04 DIAGNOSIS — E1122 Type 2 diabetes mellitus with diabetic chronic kidney disease: Secondary | ICD-10-CM | POA: Diagnosis not present

## 2021-02-04 DIAGNOSIS — G629 Polyneuropathy, unspecified: Secondary | ICD-10-CM | POA: Diagnosis not present

## 2021-02-04 DIAGNOSIS — M545 Low back pain, unspecified: Secondary | ICD-10-CM | POA: Diagnosis not present

## 2021-02-04 DIAGNOSIS — Z6822 Body mass index (BMI) 22.0-22.9, adult: Secondary | ICD-10-CM | POA: Diagnosis not present

## 2021-02-04 DIAGNOSIS — M25512 Pain in left shoulder: Secondary | ICD-10-CM | POA: Diagnosis not present

## 2021-02-04 LAB — HEPATIC FUNCTION PANEL
ALT: 13 (ref 7–35)
AST: 14 (ref 13–35)
Alkaline Phosphatase: 68 (ref 25–125)
Bilirubin, Total: 0.8

## 2021-02-04 LAB — COMPREHENSIVE METABOLIC PANEL
Albumin: 4.7 (ref 3.5–5.0)
Calcium: 11.4 — AB (ref 8.7–10.7)

## 2021-02-04 LAB — CBC: RBC: 5.27 — AB (ref 3.87–5.11)

## 2021-02-04 LAB — BASIC METABOLIC PANEL
BUN: 16 (ref 4–21)
CO2: 29 — AB (ref 13–22)
Chloride: 101 (ref 99–108)
Creatinine: 1 (ref 0.5–1.1)
Glucose: 151
Potassium: 4.9 (ref 3.4–5.3)
Sodium: 141 (ref 137–147)

## 2021-02-04 LAB — CBC AND DIFFERENTIAL
HCT: 48 — AB (ref 36–46)
Hemoglobin: 16.5 — AB (ref 12.0–16.0)
Neutrophils Absolute: 7.1
Platelets: 218 (ref 150–399)
WBC: 10.7

## 2021-02-04 LAB — HEMOGLOBIN A1C: Hemoglobin A1C: 6.5

## 2021-02-06 DIAGNOSIS — M542 Cervicalgia: Secondary | ICD-10-CM | POA: Diagnosis not present

## 2021-02-06 DIAGNOSIS — M79602 Pain in left arm: Secondary | ICD-10-CM | POA: Diagnosis not present

## 2021-02-06 DIAGNOSIS — M7522 Bicipital tendinitis, left shoulder: Secondary | ICD-10-CM | POA: Diagnosis not present

## 2021-02-06 DIAGNOSIS — M25612 Stiffness of left shoulder, not elsewhere classified: Secondary | ICD-10-CM | POA: Diagnosis not present

## 2021-02-06 DIAGNOSIS — M24112 Other articular cartilage disorders, left shoulder: Secondary | ICD-10-CM | POA: Diagnosis not present

## 2021-02-06 DIAGNOSIS — M25512 Pain in left shoulder: Secondary | ICD-10-CM | POA: Diagnosis not present

## 2021-02-07 ENCOUNTER — Encounter: Payer: Self-pay | Admitting: Oncology

## 2021-02-10 DIAGNOSIS — M542 Cervicalgia: Secondary | ICD-10-CM | POA: Diagnosis not present

## 2021-02-10 DIAGNOSIS — M24112 Other articular cartilage disorders, left shoulder: Secondary | ICD-10-CM | POA: Diagnosis not present

## 2021-02-10 DIAGNOSIS — M79602 Pain in left arm: Secondary | ICD-10-CM | POA: Diagnosis not present

## 2021-02-10 DIAGNOSIS — M7522 Bicipital tendinitis, left shoulder: Secondary | ICD-10-CM | POA: Diagnosis not present

## 2021-02-10 DIAGNOSIS — M25512 Pain in left shoulder: Secondary | ICD-10-CM | POA: Diagnosis not present

## 2021-02-10 DIAGNOSIS — M25612 Stiffness of left shoulder, not elsewhere classified: Secondary | ICD-10-CM | POA: Diagnosis not present

## 2021-02-13 DIAGNOSIS — M79602 Pain in left arm: Secondary | ICD-10-CM | POA: Diagnosis not present

## 2021-02-13 DIAGNOSIS — M542 Cervicalgia: Secondary | ICD-10-CM | POA: Diagnosis not present

## 2021-02-13 DIAGNOSIS — M25512 Pain in left shoulder: Secondary | ICD-10-CM | POA: Diagnosis not present

## 2021-02-13 DIAGNOSIS — M7522 Bicipital tendinitis, left shoulder: Secondary | ICD-10-CM | POA: Diagnosis not present

## 2021-02-13 DIAGNOSIS — M25612 Stiffness of left shoulder, not elsewhere classified: Secondary | ICD-10-CM | POA: Diagnosis not present

## 2021-02-13 DIAGNOSIS — M24112 Other articular cartilage disorders, left shoulder: Secondary | ICD-10-CM | POA: Diagnosis not present

## 2021-02-14 DIAGNOSIS — M24112 Other articular cartilage disorders, left shoulder: Secondary | ICD-10-CM | POA: Diagnosis not present

## 2021-02-17 DIAGNOSIS — M25512 Pain in left shoulder: Secondary | ICD-10-CM | POA: Diagnosis not present

## 2021-02-17 DIAGNOSIS — M542 Cervicalgia: Secondary | ICD-10-CM | POA: Diagnosis not present

## 2021-02-17 DIAGNOSIS — M2560 Stiffness of unspecified joint, not elsewhere classified: Secondary | ICD-10-CM | POA: Diagnosis not present

## 2021-02-17 DIAGNOSIS — R0789 Other chest pain: Secondary | ICD-10-CM | POA: Diagnosis not present

## 2021-02-17 DIAGNOSIS — M79602 Pain in left arm: Secondary | ICD-10-CM | POA: Diagnosis not present

## 2021-02-17 DIAGNOSIS — M7522 Bicipital tendinitis, left shoulder: Secondary | ICD-10-CM | POA: Diagnosis not present

## 2021-02-17 DIAGNOSIS — M25612 Stiffness of left shoulder, not elsewhere classified: Secondary | ICD-10-CM | POA: Diagnosis not present

## 2021-02-17 DIAGNOSIS — R293 Abnormal posture: Secondary | ICD-10-CM | POA: Diagnosis not present

## 2021-02-17 DIAGNOSIS — M25622 Stiffness of left elbow, not elsewhere classified: Secondary | ICD-10-CM | POA: Diagnosis not present

## 2021-02-17 DIAGNOSIS — M6281 Muscle weakness (generalized): Secondary | ICD-10-CM | POA: Diagnosis not present

## 2021-02-17 DIAGNOSIS — M24112 Other articular cartilage disorders, left shoulder: Secondary | ICD-10-CM | POA: Diagnosis not present

## 2021-02-20 DIAGNOSIS — M25512 Pain in left shoulder: Secondary | ICD-10-CM | POA: Diagnosis not present

## 2021-02-20 DIAGNOSIS — M24112 Other articular cartilage disorders, left shoulder: Secondary | ICD-10-CM | POA: Diagnosis not present

## 2021-02-20 DIAGNOSIS — R0789 Other chest pain: Secondary | ICD-10-CM | POA: Diagnosis not present

## 2021-02-20 DIAGNOSIS — M79602 Pain in left arm: Secondary | ICD-10-CM | POA: Diagnosis not present

## 2021-02-20 DIAGNOSIS — M7522 Bicipital tendinitis, left shoulder: Secondary | ICD-10-CM | POA: Diagnosis not present

## 2021-02-20 DIAGNOSIS — M542 Cervicalgia: Secondary | ICD-10-CM | POA: Diagnosis not present

## 2021-02-21 ENCOUNTER — Other Ambulatory Visit: Payer: Self-pay | Admitting: Oncology

## 2021-02-21 DIAGNOSIS — G8912 Acute post-thoracotomy pain: Secondary | ICD-10-CM

## 2021-02-24 DIAGNOSIS — M79602 Pain in left arm: Secondary | ICD-10-CM | POA: Diagnosis not present

## 2021-02-24 DIAGNOSIS — M24112 Other articular cartilage disorders, left shoulder: Secondary | ICD-10-CM | POA: Diagnosis not present

## 2021-02-24 DIAGNOSIS — R0789 Other chest pain: Secondary | ICD-10-CM | POA: Diagnosis not present

## 2021-02-24 DIAGNOSIS — M25512 Pain in left shoulder: Secondary | ICD-10-CM | POA: Diagnosis not present

## 2021-02-24 DIAGNOSIS — M7522 Bicipital tendinitis, left shoulder: Secondary | ICD-10-CM | POA: Diagnosis not present

## 2021-02-24 DIAGNOSIS — M542 Cervicalgia: Secondary | ICD-10-CM | POA: Diagnosis not present

## 2021-02-27 DIAGNOSIS — M7522 Bicipital tendinitis, left shoulder: Secondary | ICD-10-CM | POA: Diagnosis not present

## 2021-02-27 DIAGNOSIS — R0789 Other chest pain: Secondary | ICD-10-CM | POA: Diagnosis not present

## 2021-02-27 DIAGNOSIS — M79602 Pain in left arm: Secondary | ICD-10-CM | POA: Diagnosis not present

## 2021-02-27 DIAGNOSIS — M25512 Pain in left shoulder: Secondary | ICD-10-CM | POA: Diagnosis not present

## 2021-02-27 DIAGNOSIS — M542 Cervicalgia: Secondary | ICD-10-CM | POA: Diagnosis not present

## 2021-02-27 DIAGNOSIS — M24112 Other articular cartilage disorders, left shoulder: Secondary | ICD-10-CM | POA: Diagnosis not present

## 2021-03-06 DIAGNOSIS — M25512 Pain in left shoulder: Secondary | ICD-10-CM | POA: Diagnosis not present

## 2021-03-06 DIAGNOSIS — M79602 Pain in left arm: Secondary | ICD-10-CM | POA: Diagnosis not present

## 2021-03-06 DIAGNOSIS — M24112 Other articular cartilage disorders, left shoulder: Secondary | ICD-10-CM | POA: Diagnosis not present

## 2021-03-06 DIAGNOSIS — R0789 Other chest pain: Secondary | ICD-10-CM | POA: Diagnosis not present

## 2021-03-06 DIAGNOSIS — M542 Cervicalgia: Secondary | ICD-10-CM | POA: Diagnosis not present

## 2021-03-06 DIAGNOSIS — M7522 Bicipital tendinitis, left shoulder: Secondary | ICD-10-CM | POA: Diagnosis not present

## 2021-03-10 DIAGNOSIS — M24112 Other articular cartilage disorders, left shoulder: Secondary | ICD-10-CM | POA: Diagnosis not present

## 2021-03-11 DIAGNOSIS — E782 Mixed hyperlipidemia: Secondary | ICD-10-CM | POA: Diagnosis not present

## 2021-03-12 ENCOUNTER — Other Ambulatory Visit: Payer: Self-pay

## 2021-03-12 DIAGNOSIS — E782 Mixed hyperlipidemia: Secondary | ICD-10-CM

## 2021-03-12 LAB — LIPID PANEL
Chol/HDL Ratio: 2.2 ratio (ref 0.0–4.4)
Cholesterol, Total: 96 mg/dL — ABNORMAL LOW (ref 100–199)
HDL: 44 mg/dL (ref >39)
LDL Chol Calc (NIH): 14 mg/dL (ref 0–99)
Triglycerides: 257 mg/dL — ABNORMAL HIGH (ref 0–149)
VLDL Cholesterol Cal: 38 mg/dL (ref 5–40)

## 2021-03-12 LAB — HEPATIC FUNCTION PANEL
ALT: 11 IU/L (ref 0–32)
AST: 12 IU/L (ref 0–40)
Albumin: 4.7 g/dL (ref 3.7–4.7)
Alkaline Phosphatase: 89 IU/L (ref 44–121)
Bilirubin Total: 0.4 mg/dL (ref 0.0–1.2)
Bilirubin, Direct: 0.14 mg/dL (ref 0.00–0.40)
Total Protein: 6.8 g/dL (ref 6.0–8.5)

## 2021-03-13 DIAGNOSIS — M25512 Pain in left shoulder: Secondary | ICD-10-CM | POA: Diagnosis not present

## 2021-03-13 DIAGNOSIS — M542 Cervicalgia: Secondary | ICD-10-CM | POA: Diagnosis not present

## 2021-03-13 DIAGNOSIS — M7522 Bicipital tendinitis, left shoulder: Secondary | ICD-10-CM | POA: Diagnosis not present

## 2021-03-13 DIAGNOSIS — M79602 Pain in left arm: Secondary | ICD-10-CM | POA: Diagnosis not present

## 2021-03-13 DIAGNOSIS — M24112 Other articular cartilage disorders, left shoulder: Secondary | ICD-10-CM | POA: Diagnosis not present

## 2021-03-13 DIAGNOSIS — R0789 Other chest pain: Secondary | ICD-10-CM | POA: Diagnosis not present

## 2021-03-19 DIAGNOSIS — R0789 Other chest pain: Secondary | ICD-10-CM | POA: Diagnosis not present

## 2021-03-19 DIAGNOSIS — M79602 Pain in left arm: Secondary | ICD-10-CM | POA: Diagnosis not present

## 2021-03-19 DIAGNOSIS — M542 Cervicalgia: Secondary | ICD-10-CM | POA: Diagnosis not present

## 2021-03-19 DIAGNOSIS — M24112 Other articular cartilage disorders, left shoulder: Secondary | ICD-10-CM | POA: Diagnosis not present

## 2021-03-19 DIAGNOSIS — M25512 Pain in left shoulder: Secondary | ICD-10-CM | POA: Diagnosis not present

## 2021-03-19 DIAGNOSIS — M7522 Bicipital tendinitis, left shoulder: Secondary | ICD-10-CM | POA: Diagnosis not present

## 2021-03-27 DIAGNOSIS — M79602 Pain in left arm: Secondary | ICD-10-CM | POA: Diagnosis not present

## 2021-03-27 DIAGNOSIS — M542 Cervicalgia: Secondary | ICD-10-CM | POA: Diagnosis not present

## 2021-03-27 DIAGNOSIS — M6281 Muscle weakness (generalized): Secondary | ICD-10-CM | POA: Diagnosis not present

## 2021-03-27 DIAGNOSIS — M25622 Stiffness of left elbow, not elsewhere classified: Secondary | ICD-10-CM | POA: Diagnosis not present

## 2021-03-27 DIAGNOSIS — R079 Chest pain, unspecified: Secondary | ICD-10-CM | POA: Diagnosis not present

## 2021-03-27 DIAGNOSIS — R293 Abnormal posture: Secondary | ICD-10-CM | POA: Diagnosis not present

## 2021-03-27 DIAGNOSIS — M7522 Bicipital tendinitis, left shoulder: Secondary | ICD-10-CM | POA: Diagnosis not present

## 2021-03-27 DIAGNOSIS — M25512 Pain in left shoulder: Secondary | ICD-10-CM | POA: Diagnosis not present

## 2021-03-27 DIAGNOSIS — M24112 Other articular cartilage disorders, left shoulder: Secondary | ICD-10-CM | POA: Diagnosis not present

## 2021-03-27 DIAGNOSIS — M2560 Stiffness of unspecified joint, not elsewhere classified: Secondary | ICD-10-CM | POA: Diagnosis not present

## 2021-03-27 DIAGNOSIS — M25612 Stiffness of left shoulder, not elsewhere classified: Secondary | ICD-10-CM | POA: Diagnosis not present

## 2021-03-31 DIAGNOSIS — M25512 Pain in left shoulder: Secondary | ICD-10-CM | POA: Diagnosis not present

## 2021-03-31 DIAGNOSIS — M79602 Pain in left arm: Secondary | ICD-10-CM | POA: Diagnosis not present

## 2021-03-31 DIAGNOSIS — M542 Cervicalgia: Secondary | ICD-10-CM | POA: Diagnosis not present

## 2021-03-31 DIAGNOSIS — M7522 Bicipital tendinitis, left shoulder: Secondary | ICD-10-CM | POA: Diagnosis not present

## 2021-03-31 DIAGNOSIS — M24112 Other articular cartilage disorders, left shoulder: Secondary | ICD-10-CM | POA: Diagnosis not present

## 2021-03-31 DIAGNOSIS — R079 Chest pain, unspecified: Secondary | ICD-10-CM | POA: Diagnosis not present

## 2021-04-03 DIAGNOSIS — M7522 Bicipital tendinitis, left shoulder: Secondary | ICD-10-CM | POA: Diagnosis not present

## 2021-04-03 DIAGNOSIS — R079 Chest pain, unspecified: Secondary | ICD-10-CM | POA: Diagnosis not present

## 2021-04-03 DIAGNOSIS — M542 Cervicalgia: Secondary | ICD-10-CM | POA: Diagnosis not present

## 2021-04-03 DIAGNOSIS — M24112 Other articular cartilage disorders, left shoulder: Secondary | ICD-10-CM | POA: Diagnosis not present

## 2021-04-03 DIAGNOSIS — M79602 Pain in left arm: Secondary | ICD-10-CM | POA: Diagnosis not present

## 2021-04-03 DIAGNOSIS — M25512 Pain in left shoulder: Secondary | ICD-10-CM | POA: Diagnosis not present

## 2021-04-07 DIAGNOSIS — M7522 Bicipital tendinitis, left shoulder: Secondary | ICD-10-CM | POA: Diagnosis not present

## 2021-04-07 DIAGNOSIS — M24112 Other articular cartilage disorders, left shoulder: Secondary | ICD-10-CM | POA: Diagnosis not present

## 2021-04-07 DIAGNOSIS — M542 Cervicalgia: Secondary | ICD-10-CM | POA: Diagnosis not present

## 2021-04-07 DIAGNOSIS — M25512 Pain in left shoulder: Secondary | ICD-10-CM | POA: Diagnosis not present

## 2021-04-07 DIAGNOSIS — M79602 Pain in left arm: Secondary | ICD-10-CM | POA: Diagnosis not present

## 2021-04-07 DIAGNOSIS — R079 Chest pain, unspecified: Secondary | ICD-10-CM | POA: Diagnosis not present

## 2021-04-10 DIAGNOSIS — R079 Chest pain, unspecified: Secondary | ICD-10-CM | POA: Diagnosis not present

## 2021-04-10 DIAGNOSIS — M542 Cervicalgia: Secondary | ICD-10-CM | POA: Diagnosis not present

## 2021-04-10 DIAGNOSIS — M7522 Bicipital tendinitis, left shoulder: Secondary | ICD-10-CM | POA: Diagnosis not present

## 2021-04-10 DIAGNOSIS — M79602 Pain in left arm: Secondary | ICD-10-CM | POA: Diagnosis not present

## 2021-04-10 DIAGNOSIS — M25512 Pain in left shoulder: Secondary | ICD-10-CM | POA: Diagnosis not present

## 2021-04-10 DIAGNOSIS — M24112 Other articular cartilage disorders, left shoulder: Secondary | ICD-10-CM | POA: Diagnosis not present

## 2021-04-14 DIAGNOSIS — M542 Cervicalgia: Secondary | ICD-10-CM | POA: Diagnosis not present

## 2021-04-14 DIAGNOSIS — R079 Chest pain, unspecified: Secondary | ICD-10-CM | POA: Diagnosis not present

## 2021-04-14 DIAGNOSIS — M25512 Pain in left shoulder: Secondary | ICD-10-CM | POA: Diagnosis not present

## 2021-04-14 DIAGNOSIS — M79602 Pain in left arm: Secondary | ICD-10-CM | POA: Diagnosis not present

## 2021-04-14 DIAGNOSIS — M7522 Bicipital tendinitis, left shoulder: Secondary | ICD-10-CM | POA: Diagnosis not present

## 2021-04-14 DIAGNOSIS — M24112 Other articular cartilage disorders, left shoulder: Secondary | ICD-10-CM | POA: Diagnosis not present

## 2021-04-17 DIAGNOSIS — M25512 Pain in left shoulder: Secondary | ICD-10-CM | POA: Diagnosis not present

## 2021-04-17 DIAGNOSIS — M7522 Bicipital tendinitis, left shoulder: Secondary | ICD-10-CM | POA: Diagnosis not present

## 2021-04-17 DIAGNOSIS — M542 Cervicalgia: Secondary | ICD-10-CM | POA: Diagnosis not present

## 2021-04-17 DIAGNOSIS — M79602 Pain in left arm: Secondary | ICD-10-CM | POA: Diagnosis not present

## 2021-04-17 DIAGNOSIS — M24112 Other articular cartilage disorders, left shoulder: Secondary | ICD-10-CM | POA: Diagnosis not present

## 2021-04-17 DIAGNOSIS — R079 Chest pain, unspecified: Secondary | ICD-10-CM | POA: Diagnosis not present

## 2021-04-21 DIAGNOSIS — M79602 Pain in left arm: Secondary | ICD-10-CM | POA: Diagnosis not present

## 2021-04-21 DIAGNOSIS — R293 Abnormal posture: Secondary | ICD-10-CM | POA: Diagnosis not present

## 2021-04-21 DIAGNOSIS — M25622 Stiffness of left elbow, not elsewhere classified: Secondary | ICD-10-CM | POA: Diagnosis not present

## 2021-04-21 DIAGNOSIS — M542 Cervicalgia: Secondary | ICD-10-CM | POA: Diagnosis not present

## 2021-04-21 DIAGNOSIS — M24112 Other articular cartilage disorders, left shoulder: Secondary | ICD-10-CM | POA: Diagnosis not present

## 2021-04-21 DIAGNOSIS — M6281 Muscle weakness (generalized): Secondary | ICD-10-CM | POA: Diagnosis not present

## 2021-04-21 DIAGNOSIS — M2560 Stiffness of unspecified joint, not elsewhere classified: Secondary | ICD-10-CM | POA: Diagnosis not present

## 2021-04-21 DIAGNOSIS — M25512 Pain in left shoulder: Secondary | ICD-10-CM | POA: Diagnosis not present

## 2021-04-21 DIAGNOSIS — R0789 Other chest pain: Secondary | ICD-10-CM | POA: Diagnosis not present

## 2021-04-21 DIAGNOSIS — M25612 Stiffness of left shoulder, not elsewhere classified: Secondary | ICD-10-CM | POA: Diagnosis not present

## 2021-04-21 DIAGNOSIS — M7522 Bicipital tendinitis, left shoulder: Secondary | ICD-10-CM | POA: Diagnosis not present

## 2021-04-23 DIAGNOSIS — M24112 Other articular cartilage disorders, left shoulder: Secondary | ICD-10-CM | POA: Diagnosis not present

## 2021-04-24 DIAGNOSIS — M79602 Pain in left arm: Secondary | ICD-10-CM | POA: Diagnosis not present

## 2021-04-24 DIAGNOSIS — M25512 Pain in left shoulder: Secondary | ICD-10-CM | POA: Diagnosis not present

## 2021-04-24 DIAGNOSIS — R0789 Other chest pain: Secondary | ICD-10-CM | POA: Diagnosis not present

## 2021-04-24 DIAGNOSIS — M542 Cervicalgia: Secondary | ICD-10-CM | POA: Diagnosis not present

## 2021-04-24 DIAGNOSIS — M24112 Other articular cartilage disorders, left shoulder: Secondary | ICD-10-CM | POA: Diagnosis not present

## 2021-04-24 DIAGNOSIS — M7522 Bicipital tendinitis, left shoulder: Secondary | ICD-10-CM | POA: Diagnosis not present

## 2021-04-28 DIAGNOSIS — R0789 Other chest pain: Secondary | ICD-10-CM | POA: Diagnosis not present

## 2021-04-28 DIAGNOSIS — M24112 Other articular cartilage disorders, left shoulder: Secondary | ICD-10-CM | POA: Diagnosis not present

## 2021-04-28 DIAGNOSIS — M7522 Bicipital tendinitis, left shoulder: Secondary | ICD-10-CM | POA: Diagnosis not present

## 2021-04-28 DIAGNOSIS — M79602 Pain in left arm: Secondary | ICD-10-CM | POA: Diagnosis not present

## 2021-04-28 DIAGNOSIS — M542 Cervicalgia: Secondary | ICD-10-CM | POA: Diagnosis not present

## 2021-04-28 DIAGNOSIS — M25512 Pain in left shoulder: Secondary | ICD-10-CM | POA: Diagnosis not present

## 2021-05-01 DIAGNOSIS — M24112 Other articular cartilage disorders, left shoulder: Secondary | ICD-10-CM | POA: Diagnosis not present

## 2021-05-01 DIAGNOSIS — M7522 Bicipital tendinitis, left shoulder: Secondary | ICD-10-CM | POA: Diagnosis not present

## 2021-05-01 DIAGNOSIS — M542 Cervicalgia: Secondary | ICD-10-CM | POA: Diagnosis not present

## 2021-05-01 DIAGNOSIS — M25512 Pain in left shoulder: Secondary | ICD-10-CM | POA: Diagnosis not present

## 2021-05-01 DIAGNOSIS — M79602 Pain in left arm: Secondary | ICD-10-CM | POA: Diagnosis not present

## 2021-05-01 DIAGNOSIS — R0789 Other chest pain: Secondary | ICD-10-CM | POA: Diagnosis not present

## 2021-05-05 DIAGNOSIS — M542 Cervicalgia: Secondary | ICD-10-CM | POA: Diagnosis not present

## 2021-05-05 DIAGNOSIS — M79602 Pain in left arm: Secondary | ICD-10-CM | POA: Diagnosis not present

## 2021-05-05 DIAGNOSIS — M25512 Pain in left shoulder: Secondary | ICD-10-CM | POA: Diagnosis not present

## 2021-05-05 DIAGNOSIS — M24112 Other articular cartilage disorders, left shoulder: Secondary | ICD-10-CM | POA: Diagnosis not present

## 2021-05-05 DIAGNOSIS — M7522 Bicipital tendinitis, left shoulder: Secondary | ICD-10-CM | POA: Diagnosis not present

## 2021-05-05 DIAGNOSIS — R0789 Other chest pain: Secondary | ICD-10-CM | POA: Diagnosis not present

## 2021-05-08 DIAGNOSIS — M542 Cervicalgia: Secondary | ICD-10-CM | POA: Diagnosis not present

## 2021-05-08 DIAGNOSIS — M7522 Bicipital tendinitis, left shoulder: Secondary | ICD-10-CM | POA: Diagnosis not present

## 2021-05-08 DIAGNOSIS — M79602 Pain in left arm: Secondary | ICD-10-CM | POA: Diagnosis not present

## 2021-05-08 DIAGNOSIS — R0789 Other chest pain: Secondary | ICD-10-CM | POA: Diagnosis not present

## 2021-05-08 DIAGNOSIS — M24112 Other articular cartilage disorders, left shoulder: Secondary | ICD-10-CM | POA: Diagnosis not present

## 2021-05-08 DIAGNOSIS — M25512 Pain in left shoulder: Secondary | ICD-10-CM | POA: Diagnosis not present

## 2021-05-11 DIAGNOSIS — I2510 Atherosclerotic heart disease of native coronary artery without angina pectoris: Secondary | ICD-10-CM | POA: Insufficient documentation

## 2021-05-11 NOTE — Progress Notes (Signed)
Cardiology Office Note   Date:  05/12/2021   ID:  Faith Gaines, DOB 02/22/1946, MRN [id number]  PCP:  Maury Dus, MD  Cardiologist:   Minus Breeding, MD  Chief Complaint  Patient presents with   Chest Pain       History of Present Illness: Faith Gaines is a 75 y.o. female who is referred for evaluation of CAD/CABG.  Since she was last seen she has continued to have sternal incisional pain.  She says it syncope.  She does have a bit of a keloid there.  She previously had CT scanning and did not have anything observed by Dr.Lighfoot.  She was treated with Lyrica without improvement.  She is not having any neck or arm discomfort.  She has some chronic shoulder discomfort and actually did have surgery.  She is not having any new shortness of breath, PND or orthopnea.  She had no weight gain or edema.  Past Medical History:  Diagnosis Date   Anxiety    Bicipital tendonitis of left shoulder    Bilateral carotid artery stenosis without cerebral infarction    vascular--- dr Kellie Simmering---- s/p right CEA 12/ 2014;  last duplex in epic 07-15-2020  post right CEA with 1-39% stenosis and left ICA 1-39%   CKD (chronic kidney disease), stage III ([place])    followed by pcp   Coronary artery disease cardiologist--- dr Warren Lacy   02-01-2020 CABG x3   Depression    DOE (dyspnea on exertion)    per pt still healing from CABG 07/ 2021   GERD (gastroesophageal reflux disease)    History of left breast cancer oncologist--- dr c. 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   LIMA -- LAD,  SVG -- D1,  SVG -- OM1   Type 2 diabetes mellitus ([place])    followed by pcp--- (11-21-2020  per pt check blood sugar daily in am,  fasting sugar-- 80--150)    Past Surgical History:  Procedure Laterality Date   ANTERIOR AND POSTERIOR REPAIR N/A 10/22/2014   Procedure: CYSTO, REPAIR CYSTOCELE/RECTOCELE;  Surgeon: Bjorn Loser, MD;  Location: [facility];  Service: Urology;  Laterality: N/A;   BLADDER SUSPENSION  05/1981   ABDOMINAL MARSHALL-MARCHETTI- KRANTZ PROCEDURE, per pt   COLONOSCOPY     CORONARY ARTERY BYPASS GRAFT N/A 02/01/2020   Procedure: CORONARY ARTERY BYPASS GRAFTING (CABG) using LIMA to LAD; Endovein Harvested Greater Saphenous Vein: SVG to Diag1; SVG to OM1.;  Surgeon: Lajuana Matte, MD;  Location: [facility];  Service: Open Heart Surgery;  Laterality: N/A;   ENDARTERECTOMY Right 06/29/2013   Procedure: ENDARTERECTOMY CAROTID-RIGHT, WITH DACRON PATCH ANGIOPLASTY;  Surgeon: Mal Misty, MD;  Location: [facility];  Service: Vascular;  Laterality: Right;   ENDOVEIN HARVEST OF GREATER SAPHENOUS VEIN Left 02/01/2020   Procedure: ENDOVEIN HARVEST OF GREATER SAPHENOUS VEIN;  Surgeon: Lajuana Matte, MD;  Location: [facility];  Service: Open Heart Surgery;  Laterality: Left;   LAPAROSCOPIC ASSISTED VAGINAL HYSTERECTOMY N/A 10/22/2014   Procedure: LAPAROSCOPIC ASSISTED VAGINAL HYSTERECTOMY;  Surgeon: Marylynn Pearson, MD;  Location: [facility];  Service: Gynecology;  Laterality: N/A;  Pam from Dr. Mikle Bosworth office will call and add additional surgery for this patient in our block time.   LAPAROSCOPIC CHOLECYSTECTOMY  1985   W/  APPENDECTOMY   LEFT HEART CATH AND CORONARY ANGIOGRAPHY N/A 01/26/2020   Procedure: LEFT HEART CATH AND CORONARY ANGIOGRAPHY;  Surgeon: [name], MD;  Location: [facility];  Service: Cardiovascular;  Laterality: N/A;    MELANOMA EXCISION  12/1980   left leg,   PARTIAL MASTECTOMY WITH NEEDLE LOCALIZATION Left 08/22/2013   Procedure: PARTIAL MASTECTOMY WITH NEEDLE LOCALIZATION;  Surgeon: [place] Klein, MD;  Location: [facility];  Service: General;  Laterality: Left;   SALPINGOOPHORECTOMY Bilateral 10/22/2014   Procedure: SALPINGO OOPHORECTOMY;  Surgeon: Marylynn Pearson, MD;  Location: [facility];  Service: Gynecology;  Laterality: Bilateral;   SHOULDER ARTHROSCOPY Left 11/26/2020   Procedure: ARTHROSCOPY SHOULDER BICEPS TENODESIS;  Surgeon: Renette Butters, MD;  Location[facility];  Service: Orthopedics;  Laterality: Left;   TEE WITHOUT CARDIOVERSION N/A 02/01/2020   Procedure: TRANSESOPHAGEAL ECHOCARDIOGRAM (TEE);  Surgeon: Lajuana Matte, MD;  Location: [facility];  Service: Open Heart Surgery;  Laterality: N/A;   TUBAL LIGATION     VAGINAL PROLAPSE REPAIR N/A 10/22/2014   Procedure: VAULT PROLAPSE WITH GRAFT;  Surgeon: Bjorn Loser, MD;  Location: [facility];  Service: Urology;  Laterality: N/A;   WISDOM TOOTH EXTRACTION       Current Outpatient Medications  Medication Sig Dispense Refill   acetaminophen (TYLENOL) 500 MG tablet Take 2 tablets (1,000 mg total) by mouth every 6 (six) hours as needed for mild pain or moderate pain. 80 tablet 0   amitriptyline (ELAVIL) 50 MG tablet TAKE 1 TABLET BY MOUTH EVERYDAY AT BEDTIME 90 tablet 2   amLODipine (NORVASC) 5 MG tablet Take 5 mg by mouth daily.     aspirin 81 MG tablet Take 1 tablet (81 mg total) by mouth daily.     Biotin 5000 MCG CAPS Take 2 capsules by mouth daily.     Blood Glucose Monitoring Suppl (FREESTYLE INSULINX SYSTEM) w/Device KIT See admin instructions.     canagliflozin (INVOKANA) 300 MG TABS tablet Take 300 mg by mouth daily.     famotidine (PEPCID) 40 MG tablet Take 40 mg by mouth at bedtime.     gabapentin (NEURONTIN) 100 MG capsule Take 1 capsule (100 mg total) by mouth 2 (two) times daily. 60 capsule 1   HYDROcodone-acetaminophen  (NORCO) 10-325 MG tablet Take 1 tablet by mouth every 6 (six) hours as needed for severe pain. 20 tablet 0   methocarbamol (ROBAXIN) 500 MG tablet Take 1 tablet (500 mg total) by mouth every 8 (eight) hours as needed for muscle spasms. 20 tablet 0   Multiple Vitamins-Minerals (PRESERVISION AREDS 2 PO) Take 1 tablet by mouth daily.     nebivolol (BYSTOLIC) 5 MG tablet Take 5 mg by mouth daily. Takes with $RemoveBe'20mg'IpMWYoEGR$  to total $Remove'25mg'WClYuFq$      Nebivolol HCl (BYSTOLIC) 20 MG TABS Take 1 tablet by mouth daily. Takes with 5 mg to total 25 mg     Omega-3 Fatty Acids (FISH OIL) 1000 MG CAPS Take 1,000 mg by mouth 2 (two) times daily.     ondansetron (ZOFRAN) 4  MG tablet Take 1 tablet (4 mg total) by mouth daily as needed for nausea or vomiting. 10 tablet 0   pantoprazole (PROTONIX) 40 MG tablet Take 40 mg by mouth daily.     Pitavastatin Calcium (LIVALO) 2 MG TABS Take 2 mg by mouth 2 (two) times a week.     Saxagliptin-Metformin 2.11-998 MG TB24 Take 1 tablet by mouth 2 (two) times daily.     VITAMIN D PO Take 1,000 Units by mouth daily.     vitamin E 1000 UNIT capsule Take 1,000 Units by mouth daily.     Evolocumab (REPATHA SURECLICK) 096 MG/ML SOAJ Inject 140 mg into the skin every 14 (fourteen) days. 6 mL 3   No current facility-administered medications for this visit.    Allergies:   Codeine, Ace inhibitors, Clarithromycin, Crestor [rosuvastatin], Hydroxychloroquine sulfate, Lipitor [atorvastatin], Avandamet [rosiglitazone-metformin], and Betadine [povidone iodine]    ROS:  Please see the history of present illness.   Otherwise, review of systems are positive for none.   All other systems are reviewed and negative.    PHYSICAL EXAM: VS:  BP 126/82   Pulse 76   Ht $R'5\' 1"'Kr$  (1.549 m)   Wt 116 lb (52.6 kg)   SpO2 97%   BMI 21.92 kg/m  , BMI Body mass index is 21.92 kg/m. GENERAL:  Well appearing NECK:  No jugular venous distention, waveform within normal limits, carotid upstroke brisk and symmetric, no  bruits, no thyromegaly LUNGS:  Clear to auscultation bilaterally CHEST: Keloid with mild tenderness HEART:  PMI not displaced or sustained,S1 and S2 within normal limits, no S3, no S4, no clicks, no rubs, no murmurs ABD:  Flat, positive bowel sounds normal in frequency in pitch, no bruits, no rebound, no guarding, no midline pulsatile mass, no hepatomegaly, no splenomegaly EXT:  2 plus pulses throughout, no edema, no cyanosis no clubbing  EKG:  EKG is not ordered today.  Sinus rhythm, rate  Recent Labs: 02/04/2021: BUN 16; Creatinine 1.0; Hemoglobin 16.5; Platelets 218; Potassium 4.9; Sodium 141 03/11/2021: ALT 11    Lipid Panel    Component Value Date/Time   CHOL 96 (L) 03/11/2021 0810   TRIG 257 (H) 03/11/2021 0810   HDL 44 03/11/2021 0810   CHOLHDL 2.2 03/11/2021 0810   CHOLHDL 5.6 01/27/2020 0149   VLDL 75 (H) 01/27/2020 0149   LDLCALC 14 03/11/2021 0810      Wt Readings from Last 3 Encounters:  05/12/21 116 lb (52.6 kg)  01/28/21 119 lb 6.4 oz (54.2 kg)  12/18/20 118 lb (53.5 kg)      Other studies Reviewed: Additional studies/ records that were reviewed today include: Labs Review of the above records demonstrate: See elsewhere   ASSESSMENT AND PLAN:    CAD/CABG:     The patient has no new sypmtoms.  No further cardiovascular testing is indicated.  We will continue with aggressive risk reduction and meds as listed.  CHEST PAIN: I have asked her to talk with her dermatologist to see if there is any potential treatment of the keloid.  I am going to give her a trial of gabapentin 100 mg twice daily for 2 months to see if this would help.   DM: A1c was 6.5.  No change in therapy.   DYSLIPIDEMIA:   LDL was 14 with an HDL of 44 and a total of 96.  She is tolerating Repatha.  No change in therapy.    Current medicines are reviewed at length with the  patient today.  The patient does not have concerns regarding medicines.  The following changes have been made: As  above  Labs/ tests ordered today include: None  No orders of the defined types were placed in this encounter.    Disposition:   FU with me in 6 months  Signed, Minus Breeding, MD  05/12/2021 12:20 PM    [facility]

## 2021-05-12 ENCOUNTER — Encounter: Payer: Self-pay | Admitting: Cardiology

## 2021-05-12 ENCOUNTER — Ambulatory Visit (INDEPENDENT_AMBULATORY_CARE_PROVIDER_SITE_OTHER): Payer: Medicare Other | Admitting: Cardiology

## 2021-05-12 ENCOUNTER — Other Ambulatory Visit: Payer: Self-pay

## 2021-05-12 VITALS — BP 126/82 | HR 76 | Ht 61.0 in | Wt 116.0 lb

## 2021-05-12 DIAGNOSIS — I200 Unstable angina: Secondary | ICD-10-CM | POA: Diagnosis not present

## 2021-05-12 DIAGNOSIS — E785 Hyperlipidemia, unspecified: Secondary | ICD-10-CM | POA: Diagnosis not present

## 2021-05-12 DIAGNOSIS — E1151 Type 2 diabetes mellitus with diabetic peripheral angiopathy without gangrene: Secondary | ICD-10-CM

## 2021-05-12 DIAGNOSIS — R072 Precordial pain: Secondary | ICD-10-CM | POA: Diagnosis not present

## 2021-05-12 DIAGNOSIS — I2510 Atherosclerotic heart disease of native coronary artery without angina pectoris: Secondary | ICD-10-CM | POA: Diagnosis not present

## 2021-05-12 MED ORDER — GABAPENTIN 100 MG PO CAPS: 100.0000 mg | ORAL_CAPSULE | Freq: Two times a day (BID) | ORAL | 1 refills | Status: DC

## 2021-05-12 MED ORDER — REPATHA SURECLICK 140 MG/ML ~~LOC~~ SOAJ
140.0000 mg | SUBCUTANEOUS | 3 refills | Status: DC
Start: 2021-05-12 — End: 2021-11-14

## 2021-05-12 NOTE — Patient Instructions (Signed)
Medication Instructions:  START GABAPENTIN 100 MG TWICE A DAY   *If you need a refill on your cardiac medications before your next appointment, please call your pharmacy*  Lab Work: NONE   Testing/Procedures: NONE   Follow-Up: At [facility], you and your health needs are our priority.  As part of our continuing mission to provide you with exceptional heart care, we have created designated Provider Care Teams.  These Care Teams include your primary Cardiologist (physician) and Advanced Practice Providers (APPs -  Physician Assistants and Nurse Practitioners) who all work together to provide you with the care you need, when you need it.  We recommend signing up for the patient portal called "MyChart".  Sign up information is provided on this After Visit Summary.  MyChart is used to connect with patients for Virtual Visits (Telemedicine).  Patients are able to view lab/test results, encounter notes, upcoming appointments, etc.  Non-urgent messages can be sent to your provider as well.   To learn more about what you can do with MyChart, go to [email or web address].    Your next appointment:   6 month(s)  The format for your next appointment:   In Person  Provider:   You may see Minus Breeding, MD or one of the following Advanced Practice Providers on your designated Care Team:   Rosaria Ferries, PA-C Caron Presume, PA-C Jory Sims, DNP, ANP

## 2021-05-13 DIAGNOSIS — M24112 Other articular cartilage disorders, left shoulder: Secondary | ICD-10-CM | POA: Diagnosis not present

## 2021-05-13 DIAGNOSIS — M25512 Pain in left shoulder: Secondary | ICD-10-CM | POA: Diagnosis not present

## 2021-05-13 DIAGNOSIS — M79602 Pain in left arm: Secondary | ICD-10-CM | POA: Diagnosis not present

## 2021-05-13 DIAGNOSIS — M7522 Bicipital tendinitis, left shoulder: Secondary | ICD-10-CM | POA: Diagnosis not present

## 2021-05-13 DIAGNOSIS — M542 Cervicalgia: Secondary | ICD-10-CM | POA: Diagnosis not present

## 2021-05-13 DIAGNOSIS — R0789 Other chest pain: Secondary | ICD-10-CM | POA: Diagnosis not present

## 2021-05-15 DIAGNOSIS — M7522 Bicipital tendinitis, left shoulder: Secondary | ICD-10-CM | POA: Diagnosis not present

## 2021-05-15 DIAGNOSIS — R0789 Other chest pain: Secondary | ICD-10-CM | POA: Diagnosis not present

## 2021-05-15 DIAGNOSIS — M24112 Other articular cartilage disorders, left shoulder: Secondary | ICD-10-CM | POA: Diagnosis not present

## 2021-05-15 DIAGNOSIS — M25512 Pain in left shoulder: Secondary | ICD-10-CM | POA: Diagnosis not present

## 2021-05-15 DIAGNOSIS — M79602 Pain in left arm: Secondary | ICD-10-CM | POA: Diagnosis not present

## 2021-05-15 DIAGNOSIS — M542 Cervicalgia: Secondary | ICD-10-CM | POA: Diagnosis not present

## 2021-05-19 DIAGNOSIS — M542 Cervicalgia: Secondary | ICD-10-CM | POA: Diagnosis not present

## 2021-05-19 DIAGNOSIS — M79602 Pain in left arm: Secondary | ICD-10-CM | POA: Diagnosis not present

## 2021-05-19 DIAGNOSIS — M25512 Pain in left shoulder: Secondary | ICD-10-CM | POA: Diagnosis not present

## 2021-05-19 DIAGNOSIS — M7522 Bicipital tendinitis, left shoulder: Secondary | ICD-10-CM | POA: Diagnosis not present

## 2021-05-19 DIAGNOSIS — M24112 Other articular cartilage disorders, left shoulder: Secondary | ICD-10-CM | POA: Diagnosis not present

## 2021-05-19 DIAGNOSIS — R0789 Other chest pain: Secondary | ICD-10-CM | POA: Diagnosis not present

## 2021-05-20 ENCOUNTER — Ambulatory Visit
Admission: RE | Admit: 2021-05-20 | Discharge: 2021-05-20 | Disposition: A | Discharge status: Home / Self Care | Payer: Medicare Other | Source: Ambulatory Visit | Attending: Oncology | Admitting: Oncology

## 2021-05-20 ENCOUNTER — Other Ambulatory Visit: Payer: Self-pay

## 2021-05-20 DIAGNOSIS — Z1231 Encounter for screening mammogram for malignant neoplasm of breast: Secondary | ICD-10-CM | POA: Diagnosis not present

## 2021-05-20 DIAGNOSIS — Z1239 Encounter for other screening for malignant neoplasm of breast: Secondary | ICD-10-CM

## 2021-05-21 DIAGNOSIS — M25512 Pain in left shoulder: Secondary | ICD-10-CM | POA: Diagnosis not present

## 2021-05-21 DIAGNOSIS — M7522 Bicipital tendinitis, left shoulder: Secondary | ICD-10-CM | POA: Diagnosis not present

## 2021-05-21 DIAGNOSIS — M24112 Other articular cartilage disorders, left shoulder: Secondary | ICD-10-CM | POA: Diagnosis not present

## 2021-05-21 DIAGNOSIS — R293 Abnormal posture: Secondary | ICD-10-CM | POA: Diagnosis not present

## 2021-05-21 DIAGNOSIS — M6281 Muscle weakness (generalized): Secondary | ICD-10-CM | POA: Diagnosis not present

## 2021-05-21 DIAGNOSIS — M2560 Stiffness of unspecified joint, not elsewhere classified: Secondary | ICD-10-CM | POA: Diagnosis not present

## 2021-05-21 DIAGNOSIS — R0789 Other chest pain: Secondary | ICD-10-CM | POA: Diagnosis not present

## 2021-05-21 DIAGNOSIS — M542 Cervicalgia: Secondary | ICD-10-CM | POA: Diagnosis not present

## 2021-05-21 DIAGNOSIS — M79602 Pain in left arm: Secondary | ICD-10-CM | POA: Diagnosis not present

## 2021-05-21 DIAGNOSIS — M25612 Stiffness of left shoulder, not elsewhere classified: Secondary | ICD-10-CM | POA: Diagnosis not present

## 2021-05-21 DIAGNOSIS — M25622 Stiffness of left elbow, not elsewhere classified: Secondary | ICD-10-CM | POA: Diagnosis not present

## 2021-05-26 DIAGNOSIS — R0789 Other chest pain: Secondary | ICD-10-CM | POA: Diagnosis not present

## 2021-05-26 DIAGNOSIS — M79602 Pain in left arm: Secondary | ICD-10-CM | POA: Diagnosis not present

## 2021-05-26 DIAGNOSIS — M25512 Pain in left shoulder: Secondary | ICD-10-CM | POA: Diagnosis not present

## 2021-05-26 DIAGNOSIS — M7522 Bicipital tendinitis, left shoulder: Secondary | ICD-10-CM | POA: Diagnosis not present

## 2021-05-26 DIAGNOSIS — M24112 Other articular cartilage disorders, left shoulder: Secondary | ICD-10-CM | POA: Diagnosis not present

## 2021-05-26 DIAGNOSIS — M542 Cervicalgia: Secondary | ICD-10-CM | POA: Diagnosis not present

## 2021-05-30 DIAGNOSIS — M542 Cervicalgia: Secondary | ICD-10-CM | POA: Diagnosis not present

## 2021-05-30 DIAGNOSIS — R0789 Other chest pain: Secondary | ICD-10-CM | POA: Diagnosis not present

## 2021-05-30 DIAGNOSIS — M79602 Pain in left arm: Secondary | ICD-10-CM | POA: Diagnosis not present

## 2021-05-30 DIAGNOSIS — M24112 Other articular cartilage disorders, left shoulder: Secondary | ICD-10-CM | POA: Diagnosis not present

## 2021-05-30 DIAGNOSIS — M25512 Pain in left shoulder: Secondary | ICD-10-CM | POA: Diagnosis not present

## 2021-05-30 DIAGNOSIS — M7522 Bicipital tendinitis, left shoulder: Secondary | ICD-10-CM | POA: Diagnosis not present

## 2021-06-23 NOTE — Progress Notes (Signed)
[facility]  [address] [place],  [place]  [address] [phone]  Clinic Day:  06/30/2021  Referring physician: Maury Dus, MD   This document serves as a record of services personally performed by Hosie Poisson, MD. It was created on their behalf by Curry,Lauren E, a trained medical scribe. The creation of this record is based on the scribe's personal observations and the provider's statements to them.  CHIEF COMPLAINT:  CC: History of stage 0 left breast cancer  Current Treatment:  Surveillance  HISTORY OF PRESENT ILLNESS:  Faith Gaines is a 75 y.o. female with a history of stage 0 left breast cancer diagnosed in November of 2014 and treated with a lumpectomy in February of 2015. 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  This measured 0.1 cm for a Tis N0 N0.  She was offered chemoprevention but declined because of many other comorbidities.  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  She does have osteopenia and her last bone density scan was done in January of 2015. She gets these done through her gynecologist in [place].  In April of 2016, she had a total abdominal hysterectomy and bilateral salpingo-oophorectomy.  She did have a iron deficiency anemia in the past, but this corrected.  She tells me that she met with her primary care physician in December of 2016, and she was anemic again with a hemoglobin down to 11.  She was found to have iron deficiency.  She did complain of occasional soreness of her tongue, dizziness, and craving for ice.  She was placed on slow release iron after she was not able to tolerate ferrous sulfate.  She was fatigued and weak with dyspnea on exertion, and had daily headaches as well.  She had severe bleeding from the rectum which she attributes to hemorrhoids. Studies still showed evidence of iron deficiency  and so she was given 2 injections of Feraheme at the end of December  2016, 1 week apart.  She has had endoscopy and colonoscopy through Dr. Collene Mares in [place] several years ago, so she was referred back, and had these repeated in January of 2017.  EGD revealed diffuse moderate gastritis and colonoscopy revealed hemorrhoids.  She was told to avoid all nonsteroidal anti-inflammatory medications.    She has been diagnosed with lichen planopilaris and is using a liquid steroid from the dermatologist in [place], and topical Rogaine.  They referred her to [facility], and they felt this was immune mediated and placed her on hydroxychloroquine 200 mg twice daily.  She feels this is helping some.  She had a biopsy in the past of her right upper arm by a dermatologist in [place], Dr. Renda Rolls, and was told this was "pre melanoma".  She had a bone density scan 2 years ago, which apparently was not as good as previous.    She underwent triple bypass surgery in June 2021 and her hemoglobin was 9.0 at the last check with her cardiologist.  They took veins from her left leg and left chest.  Since her surgery, she has had pain within that left breast and along the incision.  When she was seen in September 2021, she was still having a lot of post-thoracotomy pain and so I placed her on a low dose of Elavil 25 mg.  She reported drowsiness.  Dr. Kipp Brood has placed her on  Lyrica 25 mg BID.  CT chest from January 2022 revealed mild emphysematous changes but no acute pulmonary findings or worrisome pulmonary lesions.  No mediastinal or hilar mass or adenopathy was observed.   INTERVAL HISTORY:  Faith Gaines is here for routine follow up and continues to have intermittent persistent severe pain of the central chest from her prior open heart surgery. She has been tried on gabapentin 100 mg BID, but states that she has not had improvement, so did not refill this when it ran out. I think she would have better relief  at a higher dose. We will offer her palliative care and work with the dosing of gabapentin. Other options would be methadone or referral to a pain management clinic. Annual bilateral mammogram from November was clear. Her  appetite is good, and her weight is stable since her last visit.  She denies fever, chills or other signs of infection.  She denies nausea, vomiting, bowel issues, or abdominal pain.  She denies sore throat, cough, dyspnea, or chest pain.  REVIEW OF SYSTEMS:  Review of Systems  Constitutional: Negative.  Negative for appetite change, chills, fatigue, fever and unexpected weight change.  HENT:  Negative.    Eyes: Negative.   Respiratory: Negative.  Negative for chest tightness, cough, hemoptysis, shortness of breath and wheezing.   Cardiovascular:  Positive for chest pain (chest wall pain from sternotomy incision). Negative for leg swelling and palpitations.  Gastrointestinal: Negative.  Negative for abdominal distention, abdominal pain, blood in stool, constipation, diarrhea, nausea and vomiting.  Endocrine: Negative.   Genitourinary: Negative.  Negative for difficulty urinating, dysuria, frequency and hematuria.   Musculoskeletal: Negative.  Negative for arthralgias, back pain, flank pain, gait problem and myalgias.  Skin: Negative.   Neurological: Negative.  Negative for dizziness, extremity weakness, gait problem, headaches, light-headedness, numbness, seizures and speech difficulty.  Hematological: Negative.   Psychiatric/Behavioral: Negative.  Negative for depression and sleep disturbance. The patient is not nervous/anxious.     VITALS:  Blood pressure (!) 161/72, pulse 62, temperature 98.4 F (36.9 C), temperature source Oral, resp. rate 18, height 5' 1"  (1.549 m), weight 119 lb 12.8 oz (54.3 kg), SpO2 100 %.  Wt Readings from Last 3 Encounters:  06/30/21 119 lb 12.8 oz (54.3 kg)  05/12/21 116 lb (52.6 kg)  01/28/21 119 lb 6.4 oz (54.2 kg)    Body mass index is  22.64 kg/m.  Performance status (ECOG): 1 - Symptomatic but completely ambulatory  PHYSICAL EXAM:  Physical Exam Constitutional:      General: She is not in acute distress.    Appearance: Normal appearance. She is normal weight.  HENT:     Head: Normocephalic and atraumatic.  Eyes:     General: No scleral icterus.    Extraocular Movements: Extraocular movements intact.     Conjunctiva/sclera: Conjunctivae normal.     Pupils: Pupils are equal, round, and reactive to light.  Cardiovascular:     Rate and Rhythm: Normal rate and regular rhythm.     Pulses: Normal pulses.     Heart sounds: Normal heart sounds. No murmur heard.   No friction rub. No gallop.  Pulmonary:     Effort: Pulmonary effort is normal. No respiratory distress.     Breath sounds: Normal breath sounds.  Chest:  Breasts:    Right: Normal.     Left: Normal.     Comments: Fibrocystic changes throughout both breasts, left greater than right. Well healed scar in the lateral nipple areolar  complex of the left breast. Bilateral breasts are without masses. She has a thickened mid sternal scar which has some keloid formation. This is tender to palpation.  Abdominal:     General: Bowel sounds are normal. There is no distension.     Palpations: Abdomen is soft. There is no hepatomegaly, splenomegaly or mass.     Tenderness: There is no abdominal tenderness.  Musculoskeletal:        General: Normal range of motion.     Cervical back: Normal range of motion and neck supple.     Right lower leg: No edema.     Left lower leg: No edema.  Lymphadenopathy:     Cervical: No cervical adenopathy.  Skin:    General: Skin is warm and dry.  Neurological:     General: No focal deficit present.     Mental Status: She is alert and oriented to person, place, and time. Mental status is at baseline.  Psychiatric:        Mood and Affect: Mood normal.        Behavior: Behavior normal.        Thought Content: Thought content normal.         Judgment: Judgment normal.    LABS:   CBC Latest Ref Rng & Units 02/04/2021 11/26/2020 05/29/2020  WBC - 10.7 - 10.0  Hemoglobin 12.0 - 16.0 16.5(A) 16.7(H) 16.0(H)  Hematocrit 36 - 46 48(A) 49.0(H) 49.5(H)  Platelets 150 - 399 218 - 247   CMP Latest Ref Rng & Units 03/11/2021 02/04/2021 11/26/2020  Glucose 70 - 99 mg/dL - - 152(H)  BUN 4 - 21 - 16 18  Creatinine 0.5 - 1.1 - 1.0 0.80  Sodium 137 - 147 - 141 140  Potassium 3.4 - 5.3 - 4.9 4.4  Chloride 99 - 108 - 101 105  CO2 13 - 22 - 29(A) -  Calcium 8.7 - 10.7 - 11.4(A) -  Total Protein 6.0 - 8.5 g/dL 6.8 - -  Total Bilirubin 0.0 - 1.2 mg/dL 0.4 - -  Alkaline Phos 44 - 121 IU/L 89 68 -  AST 0 - 40 IU/L 12 14 -  ALT 0 - 32 IU/L 11 13 -    Lab Results  Component Value Date   TIBC 372 05/29/2020   FERRITIN 99 05/29/2020   IRONPCTSAT 20 05/29/2020     STUDIES:  No results found.   EXAM: 05/20/2021 DIGITAL SCREENING BILATERAL MAMMOGRAM WITH TOMOSYNTHESIS AND CAD   TECHNIQUE: Bilateral screening digital craniocaudal and mediolateral oblique mammograms were obtained. Bilateral screening digital breast tomosynthesis was performed. The images were evaluated with computer-aided detection.   COMPARISON:  Previous exam(s).   ACR Breast Density Category c: The breast tissue is heterogeneously dense, which may obscure small masses.   FINDINGS: There are no findings suspicious for malignancy.   IMPRESSION: No mammographic evidence of malignancy Allergies:  Allergies  Allergen Reactions   Codeine Nausea And Vomiting and Other (See Comments)    And Headache  Other reaction(s): Unknown   Ace Inhibitors     Drop in GFR Other reaction(s): drop in GFR   Atorvastatin     Body aches Other reaction(s): body aches   Clarithromycin     Unable to sleep  Other reaction(s): unable to sleep   Crestor [Rosuvastatin]     Body aches   Hydroxychloroquine Sulfate Other (See Comments)    Per pt is has been so long ago,  unsure reaction Other reaction(s): Itching  Iodine     Other reaction(s): fungus? Other reaction(s): fungus?   Avandamet [Rosiglitazone-Metformin] Rash and Other (See Comments)    Makes her hyper   Betadine [Povidone Iodine] Rash   Other Rash    Other reaction(s): body aches Other reaction(s): rash    Current Medications: Current Outpatient Medications  Medication Sig Dispense Refill   acetaminophen (TYLENOL) 500 MG tablet Take 2 tablets (1,000 mg total) by mouth every 6 (six) hours as needed for mild pain or moderate pain. 80 tablet 0   amitriptyline (ELAVIL) 50 MG tablet TAKE 1 TABLET BY MOUTH EVERYDAY AT BEDTIME 90 tablet 2   amLODipine (NORVASC) 5 MG tablet Take 5 mg by mouth daily.     aspirin 81 MG tablet Take 1 tablet (81 mg total) by mouth daily.     Biotin 5000 MCG CAPS Take 2 capsules by mouth daily.     Blood Glucose Monitoring Suppl (FREESTYLE INSULINX SYSTEM) w/Device KIT See admin instructions.     canagliflozin (INVOKANA) 300 MG TABS tablet Take 300 mg by mouth daily.     Evolocumab (REPATHA SURECLICK) 400 MG/ML SOAJ Inject 140 mg into the skin every 14 (fourteen) days. 6 mL 3   famotidine (PEPCID) 40 MG tablet Take 40 mg by mouth at bedtime.     HYDROcodone-acetaminophen (NORCO) 10-325 MG tablet Take 1 tablet by mouth every 6 (six) hours as needed for severe pain. 20 tablet 0   methocarbamol (ROBAXIN) 500 MG tablet Take 1 tablet (500 mg total) by mouth every 8 (eight) hours as needed for muscle spasms. 20 tablet 0   Multiple Vitamins-Minerals (PRESERVISION AREDS 2 PO) Take 1 tablet by mouth daily.     nebivolol (BYSTOLIC) 5 MG tablet Take 5 mg by mouth daily. Takes with 80m to total 271m    Nebivolol HCl (BYSTOLIC) 20 MG TABS Take 1 tablet by mouth daily. Takes with 5 mg to total 25 mg     Omega-3 Fatty Acids (FISH OIL) 1000 MG CAPS Take 1,000 mg by mouth 2 (two) times daily.     ondansetron (ZOFRAN) 4 MG tablet Take 1 tablet (4 mg total) by mouth daily as needed for  nausea or vomiting. 10 tablet 0   pantoprazole (PROTONIX) 40 MG tablet Take 40 mg by mouth daily.     Pitavastatin Calcium (LIVALO) 2 MG TABS Take 2 mg by mouth 2 (two) times a week.     Saxagliptin-Metformin 2.11-998 MG TB24 Take 1 tablet by mouth 2 (two) times daily.     VITAMIN D PO Take 1,000 Units by mouth daily.     vitamin E 1000 UNIT capsule Take 1,000 Units by mouth daily.     No current facility-administered medications for this visit.     ASSESSMENT & PLAN:   Assessment:  1. Stage 0 breast cancer November 2014, treated with lumpectomy.  She has no evidence of disease.  2. Severe iron deficiency in the past, treated with intravenous supplement.  She had iron deficiency again in September 2021, treated with IV iron.  This has now resolved.  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  4. Hypercalcemia, stable, with normal vitamin-D level.   5. History of malignant melanoma resected from her left leg at age [age]No evidence of disease.  6. History of "pre melanoma" of her right upper arm resected.  The pathology revealed an inflamed junctional nevus with moderate atypia.  7. Osteopenia. She is taking oral vitamin D.  8. History of total  abdominal hysterectomy and bilateral salpingo-oophorectomy.  9.  Triple bypass surgery in June 2021.  She completed cardiac rehab.  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  She had pain despite Elavil 50 mg at bedtime, and Lyrica 25 mg twice daily, so these have been discontinued. She has been tried on gabapentin 100 mg BID, without much improvement, but I feel she would benefit from a higher dose. I will send in a prescription for gabapentin 300 mg at bedtime, and after 5 days advised that she increase the dose to 300 mg BID and even TID if she tolerates it. We will titrate her dose slowly.  10.  Left shoulder surgery for a torn tendon, May 2022.  She underwent  rehab.  Plan: As she continues to have pain despite gabapentin 200 mg daily, I will increase the dose to 300 mg at bedtime. After 5 days, she will increase the dose to 300 mg BID and TID if she tolerates this. Otherwise, we will plan to see her back in 3-4 months for palliative care assessment and management of her pain.  She understands and agrees with this plan of care.   I provided 15 minutes of face-to-face time during this this encounter and > 50% was spent counseling as documented under my assessment and plan.    Derwood Kaplan, MD [facility] [address] [place] [address] Dept: [phone] Dept Fax: [phone]   I, Rita Ohara, am acting as scribe for Derwood Kaplan, MD  I have reviewed this report as typed by the medical scribe, and it is complete and accurate.

## 2021-06-30 ENCOUNTER — Encounter: Payer: Self-pay | Admitting: Oncology

## 2021-06-30 ENCOUNTER — Inpatient Hospital Stay: Payer: Medicare Other | Attending: Oncology | Admitting: Oncology

## 2021-06-30 ENCOUNTER — Telehealth: Payer: Self-pay | Admitting: Oncology

## 2021-06-30 VITALS — BP 161/72 | HR 62 | Temp 98.4°F | Resp 18 | Ht 61.0 in | Wt 119.8 lb

## 2021-06-30 DIAGNOSIS — C50412 Malignant neoplasm of upper-outer quadrant of left female breast: Secondary | ICD-10-CM | POA: Diagnosis not present

## 2021-06-30 DIAGNOSIS — C4372 Malignant melanoma of left lower limb, including hip: Secondary | ICD-10-CM | POA: Diagnosis not present

## 2021-06-30 DIAGNOSIS — G8912 Acute post-thoracotomy pain: Secondary | ICD-10-CM

## 2021-06-30 DIAGNOSIS — Z170 Estrogen receptor positive status [ER+]: Secondary | ICD-10-CM

## 2021-06-30 NOTE — Telephone Encounter (Signed)
Per 12/12 los next appt scheduled and given to patient 

## 2021-07-04 ENCOUNTER — Other Ambulatory Visit: Payer: Self-pay | Admitting: Hematology and Oncology

## 2021-07-04 MED ORDER — GABAPENTIN 300 MG PO CAPS: 300.0000 mg | ORAL_CAPSULE | Freq: Three times a day (TID) | ORAL | 2 refills | Status: DC

## 2021-07-09 DIAGNOSIS — G8912 Acute post-thoracotomy pain: Secondary | ICD-10-CM | POA: Insufficient documentation

## 2021-08-18 DIAGNOSIS — E782 Mixed hyperlipidemia: Secondary | ICD-10-CM | POA: Diagnosis not present

## 2021-08-18 DIAGNOSIS — D509 Iron deficiency anemia, unspecified: Secondary | ICD-10-CM | POA: Diagnosis not present

## 2021-08-18 DIAGNOSIS — G629 Polyneuropathy, unspecified: Secondary | ICD-10-CM | POA: Diagnosis not present

## 2021-08-18 DIAGNOSIS — I129 Hypertensive chronic kidney disease with stage 1 through stage 4 chronic kidney disease, or unspecified chronic kidney disease: Secondary | ICD-10-CM | POA: Diagnosis not present

## 2021-08-18 DIAGNOSIS — I700 Atherosclerosis of aorta: Secondary | ICD-10-CM | POA: Diagnosis not present

## 2021-08-18 DIAGNOSIS — Z1389 Encounter for screening for other disorder: Secondary | ICD-10-CM | POA: Diagnosis not present

## 2021-08-18 DIAGNOSIS — N1831 Chronic kidney disease, stage 3a: Secondary | ICD-10-CM | POA: Diagnosis not present

## 2021-08-18 DIAGNOSIS — J439 Emphysema, unspecified: Secondary | ICD-10-CM | POA: Diagnosis not present

## 2021-08-18 DIAGNOSIS — K219 Gastro-esophageal reflux disease without esophagitis: Secondary | ICD-10-CM | POA: Diagnosis not present

## 2021-08-18 DIAGNOSIS — I739 Peripheral vascular disease, unspecified: Secondary | ICD-10-CM | POA: Diagnosis not present

## 2021-08-18 DIAGNOSIS — E1122 Type 2 diabetes mellitus with diabetic chronic kidney disease: Secondary | ICD-10-CM | POA: Diagnosis not present

## 2021-08-18 DIAGNOSIS — Z0000 Encounter for general adult medical examination without abnormal findings: Secondary | ICD-10-CM | POA: Diagnosis not present

## 2021-10-02 ENCOUNTER — Other Ambulatory Visit: Payer: Self-pay | Admitting: Hematology and Oncology

## 2021-10-02 DIAGNOSIS — M792 Neuralgia and neuritis, unspecified: Secondary | ICD-10-CM

## 2021-10-06 DIAGNOSIS — L720 Epidermal cyst: Secondary | ICD-10-CM | POA: Diagnosis not present

## 2021-10-08 ENCOUNTER — Other Ambulatory Visit: Payer: Self-pay

## 2021-10-08 ENCOUNTER — Inpatient Hospital Stay: Payer: Medicare Other | Attending: Oncology | Admitting: Oncology

## 2021-10-08 ENCOUNTER — Other Ambulatory Visit: Payer: Self-pay | Admitting: Oncology

## 2021-10-08 ENCOUNTER — Encounter: Payer: Self-pay | Admitting: Oncology

## 2021-10-08 VITALS — BP 140/74 | HR 72 | Temp 98.2°F | Resp 18 | Ht 61.0 in | Wt 121.8 lb

## 2021-10-08 DIAGNOSIS — C50412 Malignant neoplasm of upper-outer quadrant of left female breast: Secondary | ICD-10-CM

## 2021-10-08 DIAGNOSIS — Z170 Estrogen receptor positive status [ER+]: Secondary | ICD-10-CM | POA: Diagnosis not present

## 2021-10-08 DIAGNOSIS — Z1231 Encounter for screening mammogram for malignant neoplasm of breast: Secondary | ICD-10-CM

## 2021-10-08 DIAGNOSIS — G8912 Acute post-thoracotomy pain: Secondary | ICD-10-CM | POA: Diagnosis not present

## 2021-10-08 NOTE — Progress Notes (Signed)
?[facility]  ?[address] ?[place],  [place]  [address] ?[phone] ? ?Clinic Day:  10/08/21 ? ?Referring physician: Maury Dus, MD  ? ?CHIEF COMPLAINT:  ?CC: History of stage 0 left breast cancer ? ?Current Treatment:  Surveillance ? ?HISTORY OF PRESENT ILLNESS:  ?Faith Gaines is a 76 y.o. female with a history of stage 0 left breast cancer diagnosed in November of 2014 and treated with a lumpectomy in February of 2015. 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  This measured 0.1 cm for a Tis N0 N0.  She was offered chemoprevention but declined because of many other comorbidities.  She also has a history of a melanoma of the left leg resected at age 89.  She does have osteopenia and her last bone density scan was done in January of 2015. She gets these done through her gynecologist in [place].  In April of 2016, she had a total abdominal hysterectomy and bilateral salpingo-oophorectomy.  She did have a iron deficiency anemia in the past, but this corrected.  She tells me that she met with her primary care physician in December of 2016, and she was anemic again with a hemoglobin down to 11.  She was found to have iron deficiency.  She did complain of occasional soreness of her tongue, dizziness, and craving for ice.  She was placed on slow release iron after she was not able to tolerate ferrous sulfate.  She was fatigued and weak with dyspnea on exertion, and had daily headaches as well.  She had severe bleeding from the rectum which she attributes to hemorrhoids. Studies still showed evidence of iron deficiency and so she was given 2 injections of Feraheme at the end of December  2016, 1 week apart.  She has had endoscopy and colonoscopy through Dr. Collene Mares in [place] several years ago, so she was referred back, and had these repeated in January of 2017.  EGD revealed diffuse moderate gastritis  and colonoscopy revealed hemorrhoids.  She was told to avoid all nonsteroidal anti-inflammatory medications.    She has been diagnosed with lichen planopilaris and is using a liquid steroid from the dermatologist in [place], and topical Rogaine.  They referred her to [facility], and they felt this was immune mediated and placed her on hydroxychloroquine 200 mg twice daily.  She feels this is helping some.  She had a biopsy in the past of her right upper arm by a dermatologist in [place], Dr. Renda Rolls, and was told this was ?pre melanoma?.  She had a bone density scan 2 years ago, which apparently was not as good as previous.   ? ?She underwent triple bypass surgery in June 2021 and her hemoglobin was 9.0 postop.  They took veins from her left leg and left chest.  Since her surgery, she has had pain within that left breast and along the sternal incision.  When she was seen in September 2021, she was still having a lot of post-thoracotomy pain and so I placed her on a low dose of Elavil 25 mg.  She reported drowsiness.  Dr. Kipp Brood placed her on Lyrica 25 mg BID.  CT chest from January 2022 revealed mild emphysematous changes but no acute pulmonary findings or worrisome pulmonary lesions.  No mediastinal or hilar mass or adenopathy was observed.  I saw her in December 2022 and she was on gabapentin 100 mg twice daily without benefit.  I therefore recommended a slow increase in the dose and she is now up to 300 mg twice daily with 3 pills at bedtime. ? ?INTERVAL HISTORY:  ?Faith Gaines is here for follow up of her postthoracotomy pain syndrome and this is doing better with the current dose of gabapentin, although she still has pain in the chest at times.  Annual bilateral mammogram from November of 2022 was clear.  She has been on Invokana and metformin 500 mg twice daily, which is causing a lot of stomach upset.  She describes severe dyspepsia and regurgitation.  She has had severe diarrhea for a  long time and resulting hemorrhoids.  Dr. Collene Mares is her gastroenterologist and she has had colonoscopy twice but many years ago. Her  appetite is good, and her weight is up 2-1/2 pounds since her last visit.  She denies fever, chills or other signs of infection.  She denies nausea, vomiting, or abdominal pain.  She denies sore throat, cough, dyspnea, or hemoptysis. ? ?REVIEW OF SYSTEMS:  ?Review of Systems  ?Constitutional: Negative.  Negative for appetite change, chills, fatigue, fever and unexpected weight change.  ?HENT:  Negative.    ?Eyes: Negative.   ?Respiratory: Negative.  Negative for chest tightness, cough, hemoptysis, shortness of breath and wheezing.   ?Cardiovascular:  Positive for chest pain (chest wall pain from sternotomy incision). Negative for leg swelling and palpitations.  ?Gastrointestinal:  Positive for diarrhea. Negative for abdominal distention, abdominal pain, blood in stool, constipation, nausea and vomiting.  ?Endocrine: Negative.   ?Genitourinary: Negative.  Negative for difficulty urinating, dysuria, frequency and hematuria.   ?Musculoskeletal: Negative.  Negative for arthralgias, back pain, flank pain, gait problem and myalgias.  ?Skin: Negative.   ?Neurological: Negative.  Negative for dizziness, extremity weakness, gait problem, headaches, light-headedness, numbness, seizures and speech difficulty.  ?Hematological: Negative.   ?Psychiatric/Behavioral: Negative.  Negative for depression and sleep disturbance. The patient is not nervous/anxious.    ? ?VITALS:  ?Blood pressure 140/74, pulse 72, temperature 98.2 ?F (36.8 ?C), temperature source Oral, resp. rate 18, height _0  (1.549 m), weight 121 lb 12.8 oz (55.2 kg), SpO2 98 %.  ?Wt Readings from Last 3 Encounters:  ?10/08/21 121 lb 12.8 oz (55.2 kg)  ?06/30/21 119 lb 12.8 oz (54.3 kg)  ?05/12/21 116 lb (52.6 kg)  ?  ?Body mass index is 23.01 kg/m?. ? ?Performance status (ECOG): 1 - Symptomatic but completely ambulatory ? ?PHYSICAL  EXAM:  ?Physical Exam ?Constitutional:   ?   General: She is not in acute distress. ?   Appearance: Normal appearance. She is normal weight.  ?HENT:  ?   Head: Normocephalic and atraumatic.  ?Eyes:  ?   General: No scleral icterus. ?   Extraocular Movements: Extraocular movements intact.  ?   Conjunctiva/sclera: Conjunctivae normal.  ?   Pupils: Pupils are equal, round, and reactive to light.  ?Cardiovascular:  ?   Rate and Rhythm: Normal rate and regular rhythm.  ?   Pulses: Normal pulses.  ?   Heart sounds: Normal heart sounds. No murmur heard. ?  No friction rub. No gallop.  ?Pulmonary:  ?   Effort: Pulmonary effort is normal. No respiratory distress.  ?   Breath sounds: Normal breath sounds.  ?Chest:  ?Breasts: ?   Right: Normal.  ?   Left: Normal.  ?   Comments: Fibrocystic changes throughout both breasts, left greater than right. Well healed scar in the lateral nipple areolar complex of the left breast. Bilateral breasts  are without masses. She has a thickened mid sternal scar which has some keloid formation. This is tender to palpation.  ?Abdominal:  ?   General: Bowel sounds are normal. There is no distension.  ?   Palpations: Abdomen is soft. There is no hepatomegaly, splenomegaly or mass.  ?   Tenderness: There is no abdominal tenderness.  ?Musculoskeletal:     ?   General: Normal range of motion.  ?   Cervical back: Normal range of motion and neck supple.  ?   Right lower leg: No edema.  ?   Left lower leg: No edema.  ?Lymphadenopathy:  ?   Cervical: No cervical adenopathy.  ?Skin: ?   General: Skin is warm and dry.  ?Neurological:  ?   General: No focal deficit present.  ?   Mental Status: She is alert and oriented to person, place, and time. Mental status is at baseline.  ?Psychiatric:     ?   Mood and Affect: Mood normal.     ?   Behavior: Behavior normal.     ?   Thought Content: Thought content normal.     ?   Judgment: Judgment normal.  ? ? ?LABS:  ? ? ?  Latest Ref Rng & Units 02/04/2021  ? 12:00 AM  11/26/2020  ?  9:33 AM 05/29/2020  ?  1:59 PM  ?CBC  ?WBC  10.7    10.0    ?Hemoglobin 12.0 - 16.0 16.5   16.7   16.0    ?Hematocrit 36 - 46 48   49.0   49.5    ?Platelets 150 - 399 218    247    ? ? ?  Late

## 2021-10-09 ENCOUNTER — Telehealth: Payer: Self-pay

## 2021-10-09 NOTE — Telephone Encounter (Signed)
-----   Message from Derwood Kaplan, MD sent at 10/08/2021  1:07 PM EDT ----- ?Regarding: refer ?Pls refer Dr. Collene Mares, GI in [place], she has seen before. Severe epigastric tenderness, dyspepsia, regurgitation, chronic diarrhea, prob needs scope both ends ? ?

## 2021-10-09 NOTE — Telephone Encounter (Signed)
Referral faxed to Dr. Lorie Apley office in [place]. ?

## 2021-10-14 DIAGNOSIS — D485 Neoplasm of uncertain behavior of skin: Secondary | ICD-10-CM | POA: Diagnosis not present

## 2021-10-14 DIAGNOSIS — L814 Other melanin hyperpigmentation: Secondary | ICD-10-CM | POA: Diagnosis not present

## 2021-10-14 DIAGNOSIS — Z23 Encounter for immunization: Secondary | ICD-10-CM | POA: Diagnosis not present

## 2021-10-14 DIAGNOSIS — D225 Melanocytic nevi of trunk: Secondary | ICD-10-CM | POA: Diagnosis not present

## 2021-10-14 DIAGNOSIS — L905 Scar conditions and fibrosis of skin: Secondary | ICD-10-CM | POA: Diagnosis not present

## 2021-10-14 DIAGNOSIS — C44329 Squamous cell carcinoma of skin of other parts of face: Secondary | ICD-10-CM | POA: Diagnosis not present

## 2021-10-14 DIAGNOSIS — Z86018 Personal history of other benign neoplasm: Secondary | ICD-10-CM | POA: Diagnosis not present

## 2021-10-14 DIAGNOSIS — Z85828 Personal history of other malignant neoplasm of skin: Secondary | ICD-10-CM | POA: Diagnosis not present

## 2021-10-14 DIAGNOSIS — L578 Other skin changes due to chronic exposure to nonionizing radiation: Secondary | ICD-10-CM | POA: Diagnosis not present

## 2021-10-14 DIAGNOSIS — L821 Other seborrheic keratosis: Secondary | ICD-10-CM | POA: Diagnosis not present

## 2021-10-14 DIAGNOSIS — Z85820 Personal history of malignant melanoma of skin: Secondary | ICD-10-CM | POA: Diagnosis not present

## 2021-10-21 DIAGNOSIS — R1310 Dysphagia, unspecified: Secondary | ICD-10-CM | POA: Diagnosis not present

## 2021-10-21 DIAGNOSIS — K9089 Other intestinal malabsorption: Secondary | ICD-10-CM | POA: Diagnosis not present

## 2021-10-21 DIAGNOSIS — R1013 Epigastric pain: Secondary | ICD-10-CM | POA: Diagnosis not present

## 2021-10-21 DIAGNOSIS — K219 Gastro-esophageal reflux disease without esophagitis: Secondary | ICD-10-CM | POA: Diagnosis not present

## 2021-10-21 DIAGNOSIS — K760 Fatty (change of) liver, not elsewhere classified: Secondary | ICD-10-CM | POA: Diagnosis not present

## 2021-10-22 ENCOUNTER — Ambulatory Visit (INDEPENDENT_AMBULATORY_CARE_PROVIDER_SITE_OTHER): Payer: Medicare Other | Admitting: Nurse Practitioner

## 2021-10-22 ENCOUNTER — Telehealth: Payer: Self-pay | Admitting: Cardiology

## 2021-10-22 DIAGNOSIS — R1310 Dysphagia, unspecified: Secondary | ICD-10-CM | POA: Diagnosis not present

## 2021-10-22 DIAGNOSIS — Z01810 Encounter for preprocedural cardiovascular examination: Secondary | ICD-10-CM

## 2021-10-22 DIAGNOSIS — R1013 Epigastric pain: Secondary | ICD-10-CM | POA: Diagnosis not present

## 2021-10-22 DIAGNOSIS — K319 Disease of stomach and duodenum, unspecified: Secondary | ICD-10-CM | POA: Diagnosis not present

## 2021-10-22 DIAGNOSIS — K219 Gastro-esophageal reflux disease without esophagitis: Secondary | ICD-10-CM | POA: Diagnosis not present

## 2021-10-22 DIAGNOSIS — K449 Diaphragmatic hernia without obstruction or gangrene: Secondary | ICD-10-CM | POA: Diagnosis not present

## 2021-10-22 DIAGNOSIS — K2970 Gastritis, unspecified, without bleeding: Secondary | ICD-10-CM | POA: Diagnosis not present

## 2021-10-22 NOTE — Telephone Encounter (Signed)
Urgent add on tele pre op appt today per Faith Gaines, Clinic Supervisor. Med rec will be done by provider.. consent is done.  ? ?  ?Patient Consent for Virtual Visit  ? ? ?   ? ?Faith Gaines has provided verbal consent on 10/22/2021 for a virtual visit (video or telephone). ? ? ?CONSENT FOR VIRTUAL VISIT FOR:  Faith Gaines  ?By participating in this virtual visit I agree to the following: ? ?I hereby voluntarily request, consent and authorize [facility] and its employed or contracted physicians, physician assistants, nurse practitioners or other licensed health care professionals (the Practitioner), to provide me with telemedicine health care services (the ?Services") as deemed necessary by the treating Practitioner. I acknowledge and consent to receive the Services by the Practitioner via telemedicine. I understand that the telemedicine visit will involve communicating with the Practitioner through live audiovisual communication technology and the disclosure of certain medical information by electronic transmission. I acknowledge that I have been given the opportunity to request an in-person assessment or other available alternative prior to the telemedicine visit and am voluntarily participating in the telemedicine visit. ? ?I understand that I have the right to withhold or withdraw my consent to the use of telemedicine in the course of my care at any time, without affecting my right to future care or treatment, and that the Practitioner or I may terminate the telemedicine visit at any time. I understand that I have the right to inspect all information obtained and/or recorded in the course of the telemedicine visit and may receive copies of available information for a reasonable fee.  I understand that some of the potential risks of receiving the Services via telemedicine include:  ?Delay or interruption in medical evaluation due to technological equipment failure or  disruption; ?Information transmitted may not be sufficient (e.g. poor resolution of images) to allow for appropriate medical decision making by the Practitioner; and/or  ?In rare instances, security protocols could fail, causing a breach of personal health information. ? ?Furthermore, I acknowledge that it is my responsibility to provide information about my medical history, conditions and care that is complete and accurate to the best of my ability. I acknowledge that Practitioner's advice, recommendations, and/or decision may be based on factors not within their control, such as incomplete or inaccurate data provided by me or distortions of diagnostic images or specimens that may result from electronic transmissions. I understand that the practice of medicine is not an exact science and that Practitioner makes no warranties or guarantees regarding treatment outcomes. I acknowledge that a copy of this consent can be made available to me via my patient portal ([facility]), or I can request a printed copy by calling the office of [facility].   ? ?I understand that my insurance will be billed for this visit.  ? ?I have read or had this consent read to me. ?I understand the contents of this consent, which adequately explains the benefits and risks of the Services being provided via telemedicine.  ?I have been provided ample opportunity to ask questions regarding this consent and the Services and have had my questions answered to my satisfaction. ?I give my informed consent for the services to be provided through the use of telemedicine in my medical care ? ? ? ?

## 2021-10-22 NOTE — Telephone Encounter (Signed)
? ?  Pre-operative Risk Assessment  ?  ?Patient Name: Faith Gaines  ?DOB: 1946-06-21 ?MRN: [id number]  ? ?  ? ?Request for Surgical Clearance   ? ?Procedure:   Endoscopy ? ?Date of Surgery:  Clearance 10/22/21                              ?   ?Surgeon:  Dr. Richardean Sale ?Surgeon's Group or Practice Name:  [facility] ?Phone number:  [phone] ?Fax number:  [phone] ?  ?Type of Clearance Requested:   ?- Medical  ?  ?Type of Anesthesia:   Proposol ?  ?Additional requests/questions:  Please fax a copy of medical clearance to the surgeon's office. ? ?Signed, ?Durel Salts   ?10/22/2021, 10:35 AM   ?

## 2021-10-22 NOTE — Telephone Encounter (Signed)
? ?  Patient Name: Faith Gaines  ?DOB: [birth year] ?MRN: [id number] ? ?Primary Cardiologist: Minus Breeding, MD ? ?Chart reviewed as part of pre-operative protocol coverage.  ? ?Because of [name]'s past medical history and time since last visit, she will require a follow-up visit in order to better assess preoperative cardiovascular risk. ? ?Preoperative team, please contact this patient and set up a phone call appointment for further cardiac evaluation. Thank you for your help. ? ? ?[name], NP ?10/22/2021, 10:54 AM ?  ?

## 2021-10-22 NOTE — Progress Notes (Signed)
? ?Virtual Visit via Telephone Note  ? ?This visit type was conducted due to national recommendations for restrictions regarding the COVID-19 Pandemic (e.g. social distancing) in an effort to limit this patient's exposure and mitigate transmission in our community.  Due to her co-morbid illnesses, this patient is at least at moderate risk for complications without adequate follow up.  This format is felt to be most appropriate for this patient at this time.  The patient did not have access to video technology/had technical difficulties with video requiring transitioning to audio format only (telephone).  All issues noted in this document were discussed and addressed.  No physical exam could be performed with this format.  Please refer to the patient's chart for her  consent to telehealth for [facility] ? ?Evaluation Performed:  Preoperative cardiovascular risk assessment ?_____________  ? ?Date:  10/22/2021  ? ?Patient ID:  Zendayah Hardgrave, DOB 1945-08-17, MRN [id number] ?Patient Location:  ?Home ?Provider location:   ?Office ? ?Primary Care Provider:  Maury Dus, MD ?Primary Cardiologist:  Minus Breeding, MD ? ?Chief Complaint  ?  ?76 y.o. y/o female with a h/o CAD s/p CABG x3 in 2021, carotid artery stenosis, hyperlipidemia, CKD stage III, and type 2 diabetes, who is pending endoscopy, and presents today for telephonic preoperative cardiovascular risk assessment. ? ?Past Medical History  ?  ?Past Medical History:  ?Diagnosis Date  ? Anxiety   ? Bicipital tendonitis of left shoulder   ? Bilateral carotid artery stenosis without cerebral infarction   ? vascular--- dr Kellie Simmering---- s/p right CEA 12/ 2014;  last duplex in epic 07-15-2020  post right CEA with 1-39% stenosis and left ICA 1-39%  ? CKD (chronic kidney disease), stage III ([place])   ? followed by pcp  ? Coronary artery disease cardiologist--- dr Warren Lacy  ? 02-01-2020 CABG x3  ? Depression   ? DOE (dyspnea on exertion)   ? per pt still healing  from CABG 07/ 2021  ? GERD (gastroesophageal reflux disease)   ? History of left breast cancer oncologist--- dr c. Hinton Rao ([facility])  ? dx 2015;   08-22-2013 s/p left partial masectomy, Stage 0, low grade DCIS,  no chemo/ radiation  ? History of malignant melanoma 1982  ? excision left leg, per pt localized  ? History of skin cancer   ? per pt multiple excision's of BCC, SCC, and non-malignant melanoma  ? Hyperlipidemia   ? Hypertension   ? Iron deficiency anemia   ? hemotology/ oncology---- dr Hinton Rao----   has had iron infusions  ? OA (osteoarthritis)   ? Osteopenia   ? Peripheral vascular disease ([place])   ? vascular--- dr Kellie Simmering  ? 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  ? LIMA -- LAD,  SVG -- D1,  SVG -- OM1  ? Type 2 diabetes mellitus ([place])   ? followed by pcp--- (11-21-2020  per pt check blood sugar daily in am,  fasting sugar-- 80--150)  ? ?Past Surgical History:  ?Procedure Laterality Date  ? ANTERIOR AND POSTERIOR REPAIR N/A 10/22/2014  ? Procedure: CYSTO, REPAIR CYSTOCELE/RECTOCELE;  Surgeon: Bjorn Loser, MD;  Location: [facility];  Service: Urology;  Laterality: N/A;  ? BLADDER SUSPENSION  05/1981  ? ABDOMINAL MARSHALL-MARCHETTI- KRANTZ PROCEDURE, per pt  ? COLONOSCOPY    ? CORONARY ARTERY BYPASS GRAFT N/A 02/01/2020  ? Procedure: CORONARY ARTERY BYPASS GRAFTING (CABG) using LIMA to LAD; Endovein Harvested Greater Saphenous Vein: SVG to Diag1; SVG to OM1.;  Surgeon: Kipp Brood,  Lucile Crater, MD;  Location: [facility];  Service: Open Heart Surgery;  Laterality: N/A;  ? ENDARTERECTOMY Right 06/29/2013  ? Procedure: ENDARTERECTOMY CAROTID-RIGHT, WITH DACRON PATCH ANGIOPLASTY;  Surgeon: Mal Misty, MD;  Location: [facility];  Service: Vascular;  Laterality: Right;  ? ENDOVEIN HARVEST OF GREATER SAPHENOUS VEIN Left 02/01/2020  ? Procedure: ENDOVEIN HARVEST OF GREATER SAPHENOUS VEIN;  Surgeon: Lajuana Matte, MD;  Location: [facility];  Service: Open Heart Surgery;  Laterality: Left;  ? LAPAROSCOPIC ASSISTED  VAGINAL HYSTERECTOMY N/A 10/22/2014  ? Procedure: LAPAROSCOPIC ASSISTED VAGINAL HYSTERECTOMY;  Surgeon: Marylynn Pearson, MD;  Location: [facility];  Service: Gynecology;  Laterality: N/A;  Pam from Dr. Mikle Bosworth office will call and add additional surgery for this patient in our block time.  ? [place]  ? W/  APPENDECTOMY  ? LEFT HEART CATH AND CORONARY ANGIOGRAPHY N/A 01/26/2020  ? Procedure: LEFT HEART CATH AND CORONARY ANGIOGRAPHY;  Surgeon: [name], MD;  Location: [facility];  Service: Cardiovascular;  Laterality: N/A;  ? MELANOMA EXCISION  12/1980  ? left leg,  ? PARTIAL MASTECTOMY WITH NEEDLE LOCALIZATION Left 08/22/2013  ? Procedure: PARTIAL MASTECTOMY WITH NEEDLE LOCALIZATION;  Surgeon: Stark Klein, MD;  Location: [facility];  Service: General;  Laterality: Left;  ? SALPINGOOPHORECTOMY Bilateral 10/22/2014  ? Procedure: SALPINGO OOPHORECTOMY;  Surgeon: Marylynn Pearson, MD;  Location: [facility];  Service: Gynecology;  Laterality: Bilateral;  ? SHOULDER ARTHROSCOPY Left 11/26/2020  ? Procedure: ARTHROSCOPY SHOULDER BICEPS TENODESIS;  Surgeon: Renette Butters, MD;  Location: [facility];  Service: Orthopedics;  Laterality: Left;  ? TEE WITHOUT CARDIOVERSION N/A 02/01/2020  ? Procedure: TRANSESOPHAGEAL ECHOCARDIOGRAM (TEE);  Surgeon: Lajuana Matte, MD;  Location: [facility];  Service: Open Heart Surgery;  Laterality: N/A;  ? TUBAL LIGATION    ? VAGINAL PROLAPSE REPAIR N/A 10/22/2014  ? Procedure: VAULT PROLAPSE WITH GRAFT;  Surgeon: Bjorn Loser, MD;  Location: [facility];  Service: Urology;  Laterality: N/A;  ? WISDOM TOOTH EXTRACTION    ? ? ?Allergies ? ?Allergies  ?Allergen Reactions  ? Codeine Nausea And Vomiting and Other (See Comments)  ?  And Headache  ?Other reaction(s): Unknown ?Other reaction(s): Unknown  ? Ace Inhibitors   ?  Drop in GFR ?Other reaction(s): drop in GFR ?Other reaction(s): drop in GFR  ? Atorvastatin   ?  Body aches ?Other reaction(s): body  aches ?Other reaction(s): body aches  ? Clarithromycin   ?  Unable to sleep ? ?Other reaction(s): unable to sleep ?Other reaction(s): unable to sleep  ? Hydroxychloroquine Sulfate Other (See Comments)  ?  Per pt is has been so long ago, unsure reaction ?Other reaction(s): Itching ?Other reaction(s): Itching  ? Iodine   ?  Other reaction(s): fungus? ?Other reaction(s): fungus? ?Other reaction(s): fungus?  ? Rosuvastatin   ?  Body aches ?Other reaction(s): body aches  ? Avandamet [Rosiglitazone-Metformin] Rash and Other (See Comments)  ?  Makes her hyper  ? Betadine [Povidone Iodine] Rash  ? Other Rash  ?  Other reaction(s): body aches ?Other reaction(s): rash ?Other reaction(s): rash  ? ? ?History of Present Illness  ?  ?Sherby Moncayo is a 76 y.o. female who presents via audio/video conferencing for a telehealth visit today.  Pt was last seen in cardiology clinic on 05/12/2021 by Dr. Percival Spanish.  At that time Rayni Nemitz Demetriou was doing well from a cardiac standpoint. She was started on gabapentin ongoing sternal incision pain.  She denied symptoms concerning for angina. The patient is now pending endoscopy scheduled for 10/22/2021 with Dr. Richardean Sale of [facility]. Since her last visit, she is done well from a cardiac standpoint.  She does continue to have some incisional discomfort at the site of her sternotomy, however, this is unchanged. She recently saw a dermatologist and received cortisone injections to the site. She remains active and denies symptoms concerning for angina.  Overall, she reports feeling well and denies any concerns today. ? ?She denies chest pain, palpitations, dyspnea, pnd, orthopnea, n, v, dizziness, syncope, edema, weight gain, or early satiety. All other systems reviewed and are otherwise negative except as noted above.  ? ?Home Medications  ?  ?Prior to Admission medications   ?Medication Sig Start Date End Date Taking? Authorizing Provider  ?acetaminophen  (TYLENOL) 500 MG tablet Take 2 tablets (1,000 mg total) by mouth every 6 (six) hours as needed for mild pain or moderate pain. 11/26/20   Britt Bottom, PA-C  ?amitriptyline (ELAVIL) 50 MG tablet TAKE 1 TABLET BY M

## 2021-11-06 DIAGNOSIS — K760 Fatty (change of) liver, not elsewhere classified: Secondary | ICD-10-CM | POA: Diagnosis not present

## 2021-11-06 DIAGNOSIS — R1013 Epigastric pain: Secondary | ICD-10-CM | POA: Diagnosis not present

## 2021-11-06 DIAGNOSIS — K219 Gastro-esophageal reflux disease without esophagitis: Secondary | ICD-10-CM | POA: Diagnosis not present

## 2021-11-06 DIAGNOSIS — E119 Type 2 diabetes mellitus without complications: Secondary | ICD-10-CM | POA: Diagnosis not present

## 2021-11-14 ENCOUNTER — Other Ambulatory Visit: Payer: Self-pay | Admitting: Pharmacist Clinician (PhC)/ Clinical Pharmacy Specialist

## 2021-11-14 ENCOUNTER — Ambulatory Visit: Payer: Medicare Other | Admitting: Cardiology

## 2021-11-14 ENCOUNTER — Ambulatory Visit (INDEPENDENT_AMBULATORY_CARE_PROVIDER_SITE_OTHER): Payer: Medicare Other | Admitting: Cardiology

## 2021-11-14 ENCOUNTER — Encounter: Payer: Self-pay | Admitting: Cardiology

## 2021-11-14 VITALS — BP 128/62 | HR 64 | Ht 61.0 in | Wt 121.0 lb

## 2021-11-14 DIAGNOSIS — E1151 Type 2 diabetes mellitus with diabetic peripheral angiopathy without gangrene: Secondary | ICD-10-CM

## 2021-11-14 DIAGNOSIS — E785 Hyperlipidemia, unspecified: Secondary | ICD-10-CM | POA: Diagnosis not present

## 2021-11-14 DIAGNOSIS — I2510 Atherosclerotic heart disease of native coronary artery without angina pectoris: Secondary | ICD-10-CM

## 2021-11-14 MED ORDER — REPATHA SURECLICK 140 MG/ML ~~LOC~~ SOAJ: 140.0000 mg | SUBCUTANEOUS | 3 refills | Status: DC

## 2021-11-14 NOTE — Progress Notes (Signed)
?  ?Cardiology Office Note ? ? ?Date:  11/14/2021  ? ?ID:  Faith Gaines, DOB [birth year], MRN [id number] ? ?PCP:  Maury Dus, MD  ?Cardiologist:   Minus Breeding, MD ? ?Chief Complaint  ?Patient presents with  ? Chest Pain  ? ? ? ?  ?History of Present Illness: ?Faith Gaines is a 76 y.o. female who is referred for evaluation of CAD/CABG. she continues to have some incisional chest discomfort.  She did get some steroid injections in her keloid that helped a little bit.  She has aches and pains but she is not getting any new substernal chest pressure, neck or arm discomfort.  She does still have some chronic shortness of breath.  She is not having any PND or orthopnea.  Has had no weight gain or edema. ? ? ?Past Medical History:  ?Diagnosis Date  ? Anxiety   ? Bicipital tendonitis of left shoulder   ? Bilateral carotid artery stenosis without cerebral infarction   ? vascular--- dr Kellie Simmering---- s/p right CEA 12/ 2014;  last duplex in epic 07-15-2020  post right CEA with 1-39% stenosis and left ICA 1-39%  ? CKD (chronic kidney disease), stage III ([place])   ? followed by pcp  ? Coronary artery disease cardiologist--- dr Warren Lacy  ? 02-01-2020 CABG x3  ? Depression   ? DOE (dyspnea on exertion)   ? per pt still healing from CABG 07/ 2021  ? GERD (gastroesophageal reflux disease)   ? History of left breast cancer oncologist--- dr c. Hinton Rao ([facility])  ? dx 2015;   08-22-2013 s/p left partial masectomy, Stage 0, low grade DCIS,  no chemo/ radiation  ? History of malignant melanoma 1982  ? excision left leg, per pt localized  ? History of skin cancer   ? per pt multiple excision's of BCC, SCC, and non-malignant melanoma  ? Hyperlipidemia   ? Hypertension   ? Iron deficiency anemia   ? hemotology/ oncology---- dr Hinton Rao----   has had iron infusions  ? OA (osteoarthritis)   ? Osteopenia   ? Peripheral vascular disease ([place])   ? vascular--- dr Kellie Simmering  ? 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   ? LIMA -- LAD,  SVG -- D1,  SVG -- OM1  ? Type 2 diabetes mellitus ([place])   ? followed by pcp--- (11-21-2020  per pt check blood sugar daily in am,  fasting sugar-- 80--150)  ? ? ?Past Surgical History:  ?Procedure Laterality Date  ? ANTERIOR AND POSTERIOR REPAIR N/A 10/22/2014  ? Procedure: CYSTO, REPAIR CYSTOCELE/RECTOCELE;  Surgeon: Bjorn Loser, MD;  Location: [facility];  Service: Urology;  Laterality: N/A;  ? BLADDER SUSPENSION  05/1981  ? ABDOMINAL MARSHALL-MARCHETTI- KRANTZ PROCEDURE, per pt  ? COLONOSCOPY    ? CORONARY ARTERY BYPASS GRAFT N/A 02/01/2020  ? Procedure: CORONARY ARTERY BYPASS GRAFTING (CABG) using LIMA to LAD; Endovein Harvested Greater Saphenous Vein: SVG to Diag1; SVG to OM1.;  Surgeon: Lajuana Matte, MD;  Location: [facility];  Service: Open Heart Surgery;  Laterality: N/A;  ? ENDARTERECTOMY Right 06/29/2013  ? Procedure: ENDARTERECTOMY CAROTID-RIGHT, WITH DACRON PATCH ANGIOPLASTY;  Surgeon: Mal Misty, MD;  Location: [facility];  Service: Vascular;  Laterality: Right;  ? ENDOVEIN HARVEST OF GREATER SAPHENOUS VEIN Left 02/01/2020  ? Procedure: ENDOVEIN HARVEST OF GREATER SAPHENOUS VEIN;  Surgeon: Lajuana Matte, MD;  Location: [facility];  Service: Open Heart Surgery;  Laterality: Left;  ? LAPAROSCOPIC ASSISTED VAGINAL HYSTERECTOMY  N/A 10/22/2014  ? Procedure: LAPAROSCOPIC ASSISTED VAGINAL HYSTERECTOMY;  Surgeon: Marylynn Pearson, MD;  Location: [facility];  Service: Gynecology;  Laterality: N/A;  Pam from Dr. Mikle Bosworth office will call and add additional surgery for this patient in our block time.  ? [name]  ? W/  APPENDECTOMY  ? LEFT HEART CATH AND CORONARY ANGIOGRAPHY N/A 01/26/2020  ? Procedure: LEFT HEART CATH AND CORONARY ANGIOGRAPHY;  Surgeon: [name], MD;  Location: [facility];  Service: Cardiovascular;  Laterality: N/A;  ? MELANOMA EXCISION  12/1980  ? left leg,  ? PARTIAL MASTECTOMY WITH NEEDLE LOCALIZATION Left 08/22/2013  ? Procedure: PARTIAL  MASTECTOMY WITH NEEDLE LOCALIZATION;  Surgeon: Stark Klein, MD;  Location: [facility];  Service: General;  Laterality: Left;  ? SALPINGOOPHORECTOMY Bilateral 10/22/2014  ? Procedure: SALPINGO OOPHORECTOMY;  Surgeon: Marylynn Pearson, MD;  Location: [facility];  Service: Gynecology;  Laterality: Bilateral;  ? SHOULDER ARTHROSCOPY Left 11/26/2020  ? Procedure: ARTHROSCOPY SHOULDER BICEPS TENODESIS;  Surgeon: Renette Butters, MD;  Location: [facility];  Service: Orthopedics;  Laterality: Left;  ? TEE WITHOUT CARDIOVERSION N/A 02/01/2020  ? Procedure: TRANSESOPHAGEAL ECHOCARDIOGRAM (TEE);  Surgeon: Lajuana Matte, MD;  Location: [facility];  Service: Open Heart Surgery;  Laterality: N/A;  ? TUBAL LIGATION    ? VAGINAL PROLAPSE REPAIR N/A 10/22/2014  ? Procedure: VAULT PROLAPSE WITH GRAFT;  Surgeon: Bjorn Loser, MD;  Location: [facility];  Service: Urology;  Laterality: N/A;  ? WISDOM TOOTH EXTRACTION    ? ? ? ?Current Outpatient Medications  ?Medication Sig Dispense Refill  ? amitriptyline (ELAVIL) 50 MG tablet TAKE 1 TABLET BY MOUTH EVERYDAY AT BEDTIME 90 tablet 2  ? aspirin 81 MG tablet Take 1 tablet (81 mg total) by mouth daily.    ? Biotin 5000 MCG CAPS Take 2 capsules by mouth daily.    ? Blood Glucose Monitoring Suppl (FREESTYLE INSULINX SYSTEM) w/Device KIT See admin instructions.    ? canagliflozin (INVOKANA) 300 MG TABS tablet Take 300 mg by mouth daily.    ? Cholecalciferol (VITAMIN D3) 50 MCG (2000 UT) TABS Take 50 mcg by mouth daily.    ? famotidine (PEPCID) 40 MG tablet Take 40 mg by mouth at bedtime.    ? gabapentin (NEURONTIN) 300 MG capsule Take 1 capsule (300 mg total) by mouth 3 (three) times daily. (Patient taking differently: Take 300 mg by mouth in the morning, at noon, in the evening, and at bedtime.) 270 capsule 2  ? metFORMIN (GLUCOPHAGE-XR) 500 MG 24 hr tablet Take 500 mg by mouth 2 (two) times daily.    ? Multiple Vitamins-Minerals (PRESERVISION AREDS 2 PO) Take 1 tablet by mouth daily.     ? nebivolol (BYSTOLIC) 5 MG tablet Take 5 mg by mouth daily. Takes with 62m to total 248m   ? Nebivolol HCl (BYSTOLIC) 20 MG TABS Take 1 tablet by mouth daily. Takes with 5 mg to total 25 mg    ? Omega-3 Fatty Acids (FISH OIL) 1000 MG CAPS Take 1,000 mg by mouth in the morning, at noon, in the evening, and at bedtime.    ? pantoprazole (PROTONIX) 40 MG tablet Take 40 mg by mouth daily.    ? Semaglutide,0.25 or 0.5MG/DOS, (OZEMPIC, 0.25 OR 0.5 MG/DOSE,) 2 MG/1.5ML SOPN 0.25 MG    ? vitamin B-12 (CYANOCOBALAMIN) 1000 MCG tablet 500 mcg.    ? VITAMIN D PO Take 1,000 Units by mouth daily.    ? vitamin  E 1000 UNIT capsule Take 1,000 Units by mouth daily.    ? Evolocumab (REPATHA SURECLICK) 100 MG/ML SOAJ Inject 140 mg into the skin every 14 (fourteen) days. 6 mL 3  ? ?No current facility-administered medications for this visit.  ? ? ?Allergies:   Codeine, Ace inhibitors, Atorvastatin, Clarithromycin, Hydroxychloroquine sulfate, Iodine, Rosuvastatin, Avandamet [rosiglitazone-metformin], Betadine [povidone iodine], and Other  ? ? ?ROS:  Please see the history of present illness.   Otherwise, review of systems are positive for none.   All other systems are reviewed and negative.  ? ? ?PHYSICAL EXAM: ?VS:  BP 128/62 (BP Location: Left Arm, Patient Position: Sitting, Cuff Size: Normal)   Pulse 64   Ht 5' 1"  (1.549 m)   Wt 121 lb (54.9 kg)   BMI 22.86 kg/m?  , BMI Body mass index is 22.86 kg/m?. ?GENERAL:  Well appearing ?NECK:  No jugular venous distention, waveform within normal limits, carotid upstroke brisk and symmetric, no bruits, no thyromegaly ?LUNGS:  Clear to auscultation bilaterally ?CHEST:  Well healed sternotomy scar. ?HEART:  PMI not displaced or sustained,S1 and S2 within normal limits, no S3, no S4, no clicks, no rubs, no murmurs ?ABD:  Flat, positive bowel sounds normal in frequency in pitch, no bruits, no rebound, no guarding, no midline pulsatile mass, no hepatomegaly, no splenomegaly ?EXT:  2 plus  pulses throughout, no edema, no cyanosis no clubbing ? ? ? ?EKG:  EKG is  ordered today.  ?Sinus rhythm, rate 64, left axis deviation, old anteroseptal infarct.  No acute ST-T wave changes. ? ?Recent Labs:

## 2021-11-14 NOTE — Patient Instructions (Signed)
Medication Instructions:  ?Your Physician recommend you continue on your current medication as directed.   ? ?*If you need a refill on your cardiac medications before your next appointment, please call your pharmacy* ? ? ?Lab Work: ?None ordered today ? ? ?Testing/Procedures: ?None ordered today ? ?Follow-Up: ?At [facility], you and your health needs are our priority.  As part of our continuing mission to provide you with exceptional heart care, we have created designated Provider Care Teams.  These Care Teams include your primary Cardiologist (physician) and Advanced Practice Providers (APPs -  Physician Assistants and Nurse Practitioners) who all work together to provide you with the care you need, when you need it. ? ?We recommend signing up for the patient portal called "MyChart".  Sign up information is provided on this After Visit Summary.  MyChart is used to connect with patients for Virtual Visits (Telemedicine).  Patients are able to view lab/test results, encounter notes, upcoming appointments, etc.  Non-urgent messages can be sent to your provider as well.   ?To learn more about what you can do with MyChart, go to [email or web address].   ? ?Your next appointment:   ?1 year(s) ? ?The format for your next appointment:   ?In Person ? ?Provider:   ?James Hochrein, MD { ? ?Important Information About Sugar ? ? ? ? ? ? ?

## 2021-12-04 DIAGNOSIS — S93602A Unspecified sprain of left foot, initial encounter: Secondary | ICD-10-CM | POA: Diagnosis not present

## 2021-12-19 DIAGNOSIS — M76822 Posterior tibial tendinitis, left leg: Secondary | ICD-10-CM | POA: Diagnosis not present

## 2021-12-25 DIAGNOSIS — C44329 Squamous cell carcinoma of skin of other parts of face: Secondary | ICD-10-CM | POA: Diagnosis not present

## 2022-01-06 DIAGNOSIS — L720 Epidermal cyst: Secondary | ICD-10-CM | POA: Diagnosis not present

## 2022-01-06 DIAGNOSIS — I129 Hypertensive chronic kidney disease with stage 1 through stage 4 chronic kidney disease, or unspecified chronic kidney disease: Secondary | ICD-10-CM | POA: Diagnosis not present

## 2022-01-09 DIAGNOSIS — R110 Nausea: Secondary | ICD-10-CM | POA: Diagnosis not present

## 2022-01-09 DIAGNOSIS — L720 Epidermal cyst: Secondary | ICD-10-CM | POA: Diagnosis not present

## 2022-01-12 DIAGNOSIS — L7621 Postprocedural hemorrhage and hematoma of skin and subcutaneous tissue following a dermatologic procedure: Secondary | ICD-10-CM | POA: Diagnosis not present

## 2022-01-16 DIAGNOSIS — M76822 Posterior tibial tendinitis, left leg: Secondary | ICD-10-CM | POA: Diagnosis not present

## 2022-01-22 ENCOUNTER — Other Ambulatory Visit: Payer: Self-pay | Admitting: *Deleted

## 2022-01-22 DIAGNOSIS — I6523 Occlusion and stenosis of bilateral carotid arteries: Secondary | ICD-10-CM

## 2022-01-28 DIAGNOSIS — M7989 Other specified soft tissue disorders: Secondary | ICD-10-CM | POA: Diagnosis not present

## 2022-01-28 DIAGNOSIS — R600 Localized edema: Secondary | ICD-10-CM | POA: Diagnosis not present

## 2022-01-28 DIAGNOSIS — W19XXXA Unspecified fall, initial encounter: Secondary | ICD-10-CM | POA: Diagnosis not present

## 2022-01-28 DIAGNOSIS — M25572 Pain in left ankle and joints of left foot: Secondary | ICD-10-CM | POA: Diagnosis not present

## 2022-02-10 DIAGNOSIS — M76822 Posterior tibial tendinitis, left leg: Secondary | ICD-10-CM | POA: Diagnosis not present

## 2022-02-17 ENCOUNTER — Ambulatory Visit: Payer: Medicare Other

## 2022-02-17 ENCOUNTER — Encounter (HOSPITAL_COMMUNITY): Payer: Medicare Other

## 2022-03-03 DIAGNOSIS — I700 Atherosclerosis of aorta: Secondary | ICD-10-CM | POA: Diagnosis not present

## 2022-03-03 DIAGNOSIS — I739 Peripheral vascular disease, unspecified: Secondary | ICD-10-CM | POA: Diagnosis not present

## 2022-03-03 DIAGNOSIS — N1831 Chronic kidney disease, stage 3a: Secondary | ICD-10-CM | POA: Diagnosis not present

## 2022-03-03 DIAGNOSIS — M545 Low back pain, unspecified: Secondary | ICD-10-CM | POA: Diagnosis not present

## 2022-03-03 DIAGNOSIS — J439 Emphysema, unspecified: Secondary | ICD-10-CM | POA: Diagnosis not present

## 2022-03-03 DIAGNOSIS — G629 Polyneuropathy, unspecified: Secondary | ICD-10-CM | POA: Diagnosis not present

## 2022-03-03 DIAGNOSIS — I129 Hypertensive chronic kidney disease with stage 1 through stage 4 chronic kidney disease, or unspecified chronic kidney disease: Secondary | ICD-10-CM | POA: Diagnosis not present

## 2022-03-03 DIAGNOSIS — E782 Mixed hyperlipidemia: Secondary | ICD-10-CM | POA: Diagnosis not present

## 2022-03-03 DIAGNOSIS — E1122 Type 2 diabetes mellitus with diabetic chronic kidney disease: Secondary | ICD-10-CM | POA: Diagnosis not present

## 2022-03-03 DIAGNOSIS — M25512 Pain in left shoulder: Secondary | ICD-10-CM | POA: Diagnosis not present

## 2022-03-03 DIAGNOSIS — K219 Gastro-esophageal reflux disease without esophagitis: Secondary | ICD-10-CM | POA: Diagnosis not present

## 2022-03-03 DIAGNOSIS — D509 Iron deficiency anemia, unspecified: Secondary | ICD-10-CM | POA: Diagnosis not present

## 2022-03-17 ENCOUNTER — Ambulatory Visit (HOSPITAL_COMMUNITY)
Admission: RE | Admit: 2022-03-17 | Discharge: 2022-03-17 | Disposition: A | Discharge status: Home / Self Care | Payer: Medicare Other | Source: Ambulatory Visit | Attending: Physician Assistant | Admitting: Physician Assistant

## 2022-03-17 ENCOUNTER — Ambulatory Visit (INDEPENDENT_AMBULATORY_CARE_PROVIDER_SITE_OTHER): Payer: Medicare Other | Admitting: Physician Assistant

## 2022-03-17 VITALS — BP 135/84 | HR 70 | Temp 97.7°F | Ht 61.0 in | Wt 115.0 lb

## 2022-03-17 DIAGNOSIS — I6523 Occlusion and stenosis of bilateral carotid arteries: Secondary | ICD-10-CM | POA: Diagnosis not present

## 2022-03-17 NOTE — Progress Notes (Signed)
Office Note     CC:  follow up Requesting Provider:  Maury Dus, MD  HPI: Faith Gaines is a 76 y.o. (08/01/1945) female who presents for surveillance of carotid artery stenosis.  Surgical history significant for right carotid endarterectomy by Dr. Kellie Simmering in 2014 due to asymptomatic high-grade stenosis.  She denies any TIA or CVA since last office visit.  She also denies any strokelike symptoms including slurring speech, changes in vision, or one-sided weakness.  She is a former smoker.  She is on an aspirin daily.   Past Medical History:  Diagnosis Date   Anxiety    Bicipital tendonitis of left shoulder    Bilateral carotid artery stenosis without cerebral infarction    vascular--- dr Kellie Simmering---- s/p right CEA 12/ 2014;  last duplex in epic 07-15-2020  post right CEA with 1-39% stenosis and left ICA 1-39%   CKD (chronic kidney disease), stage III ([place])    followed by pcp   Coronary artery disease cardiologist--- dr Warren Lacy   02-01-2020 CABG x3   Depression    DOE (dyspnea on exertion)    per pt still healing from CABG 07/ 2021   GERD (gastroesophageal reflux disease)    History of left breast cancer oncologist--- dr c. 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   LIMA -- LAD,  SVG -- D1,  SVG -- OM1   Type 2 diabetes mellitus ([place])    followed by pcp--- (11-21-2020  per pt check blood sugar daily in am,  fasting sugar-- 80--150)    Past  Surgical History:  Procedure Laterality Date   ANTERIOR AND POSTERIOR REPAIR N/A 10/22/2014   Procedure: CYSTO, REPAIR CYSTOCELE/RECTOCELE;  Surgeon: Bjorn Loser, MD;  Location: [facility];  Service: Urology;  Laterality: N/A;   BLADDER SUSPENSION  05/1981   ABDOMINAL MARSHALL-MARCHETTI- KRANTZ PROCEDURE, per pt   COLONOSCOPY     CORONARY ARTERY BYPASS GRAFT N/A 02/01/2020   Procedure: CORONARY ARTERY BYPASS GRAFTING (CABG) using LIMA to LAD; Endovein Harvested Greater Saphenous Vein: SVG to Diag1; SVG to OM1.;  Surgeon: Lajuana Matte, MD;  Location: [facility];  Service: Open Heart Surgery;  Laterality: N/A;   ENDARTERECTOMY Right 06/29/2013   Procedure: ENDARTERECTOMY CAROTID-RIGHT, WITH DACRON PATCH ANGIOPLASTY;  Surgeon: Mal Misty, MD;  Location: [facility];  Service: Vascular;  Laterality: Right;   ENDOVEIN HARVEST OF GREATER SAPHENOUS VEIN Left 02/01/2020   Procedure: ENDOVEIN HARVEST OF GREATER SAPHENOUS VEIN;  Surgeon: Lajuana Matte, MD;  Location: [facility];  Service: Open Heart Surgery;  Laterality: Left;   LAPAROSCOPIC ASSISTED VAGINAL HYSTERECTOMY N/A 10/22/2014   Procedure: LAPAROSCOPIC ASSISTED VAGINAL HYSTERECTOMY;  Surgeon: Marylynn Pearson, MD;  Location: [facility];  Service: Gynecology;  Laterality: N/A;  Pam from Dr. Mikle Bosworth office will call and add additional surgery for this patient in our block time.   LAPAROSCOPIC CHOLECYSTECTOMY  [place]ATH AND CORONARY ANGIOGRAPHY N/A 01/26/2020   Procedure: LEFT HEART CATH AND CORONARY ANGIOGRAPHY;  Surgeon: [name], MD;  Location: [facility];  Service: Cardiovascular;  Laterality: N/A;   MELANOMA EXCISION  12/1980   left leg,   PARTIAL MASTECTOMY WITH NEEDLE LOCALIZATION Left 08/22/2013   Procedure: PARTIAL MASTECTOMY WITH NEEDLE LOCALIZATION;  Surgeon: Stark Klein, MD;  Location: [place];  Service: General;  Laterality: Left;   SALPINGOOPHORECTOMY Bilateral 10/22/2014   Procedure: SALPINGO  OOPHORECTOMY;  Surgeon: Marylynn Pearson, MD;  Location: [facility];  Service: Gynecology;  Laterality: Bilateral;   SHOULDER ARTHROSCOPY Left 11/26/2020   Procedure: ARTHROSCOPY SHOULDER BICEPS TENODESIS;  Surgeon: Renette Butters, MD;  Location: [facility];  Service: Orthopedics;  Laterality: Left;   TEE WITHOUT CARDIOVERSION N/A 02/01/2020   Procedure: TRANSESOPHAGEAL ECHOCARDIOGRAM (TEE);  Surgeon: Lajuana Matte, MD;  Location: [facility];  Service: Open Heart Surgery;  Laterality: N/A;   TUBAL LIGATION     VAGINAL PROLAPSE REPAIR N/A 10/22/2014   Procedure: VAULT PROLAPSE WITH GRAFT;  Surgeon: Bjorn Loser, MD;  Location: [facility];  Service: Urology;  Laterality: N/A;   WISDOM TOOTH EXTRACTION      Social History   Socioeconomic History   Marital status: Widowed    Spouse name: Not on file   Number of children: Not on file   Years of education: Not on file   Highest education level: Not on file  Occupational History    Employer: [employer]     Comment: [employer]  Tobacco Use   Smoking status: Former    Packs/day: 0.25    Years: 3.00    Total pack years: 0.75    Types: Cigarettes    Quit date: 07/20/2010    Years since quitting: 11.6   Smokeless tobacco: Never  Vaping Use   Vaping Use: Never used  Substance and Sexual Activity   Alcohol use: No    Alcohol/week: 0.0 standard drinks of alcohol   Drug use: Never   Sexual activity: Yes    Birth control/protection: Post-menopausal, Surgical  Other Topics Concern   Not on file  Social History Narrative   Widow.  Lives alone.  Three children and 9 grands.     Social Determinants of Health   Financial Resource Strain: Not on file  Food Insecurity: Not on file  Transportation Needs: Not on file  Physical Activity: Not on file  Stress: Not on file  Social Connections: Not on file  Intimate Partner Violence: Not on file    Family History  Problem Relation Age of Onset   Cancer Mother         breast, uterus, lung   Heart attack Mother 66   Stroke Mother    Heart disease Mother        Open Heart Surgery   Hypertension Mother    Varicose Veins Mother    Heart disease Father    Heart attack Father 74       Died of MI age 77   Kidney disease Father    Hypertension Father    Diabetes Father    Cancer Father    Asthma Father    Heart attack Sister 62       ICD   Heart disease Sister    Healthy Daughter    Healthy Daughter    Healthy Son     Current Outpatient Medications  Medication Sig Dispense Refill   amitriptyline (ELAVIL) 50 MG tablet TAKE 1 TABLET BY MOUTH EVERYDAY AT BEDTIME 90 tablet 2   aspirin 81 MG tablet Take 1 tablet (81 mg total) by mouth daily.     Biotin 5000 MCG CAPS Take 2 capsules by mouth daily.     Blood Glucose Monitoring Suppl (FREESTYLE INSULINX SYSTEM) w/Device KIT See admin instructions.     canagliflozin (INVOKANA) 300 MG TABS tablet Take 300 mg by mouth daily.     Cholecalciferol (VITAMIN D3) 50 MCG (2000 UT) TABS Take 50 mcg by mouth daily.     Evolocumab (REPATHA SURECLICK) 893 MG/ML SOAJ Inject 140 mg into the skin every 14 (fourteen) days. 6 mL 3   famotidine (PEPCID) 40 MG tablet Take 40 mg by mouth at bedtime.     gabapentin (NEURONTIN) 300 MG capsule Take 1 capsule (300 mg total) by mouth 3 (three) times daily. (Patient taking differently: Take 300 mg by mouth in the morning, at noon, in the evening, and at bedtime.) 270 capsule 2   metFORMIN (GLUCOPHAGE-XR) 500 MG 24 hr tablet Take 500 mg by mouth 2 (two) times daily.     Multiple Vitamins-Minerals (PRESERVISION AREDS 2 PO) Take 1 tablet by mouth daily.     nebivolol (BYSTOLIC) 5 MG tablet Take 5 mg by mouth daily. Takes with 17m to total 219m    Nebivolol HCl (BYSTOLIC) 20 MG TABS Take 1 tablet by mouth daily. Takes with 5 mg to total 25 mg     Omega-3 Fatty Acids (FISH OIL) 1000 MG CAPS Take 1,000 mg by mouth in the morning, at noon, in the evening, and at bedtime.     pantoprazole  (PROTONIX) 40 MG tablet Take 40 mg by mouth daily.     Semaglutide,0.25 or 0.5MG/DOS, (OZEMPIC, 0.25 OR 0.5 MG/DOSE,) 2 MG/1.5ML SOPN 0.25 MG     vitamin B-12 (CYANOCOBALAMIN) 1000 MCG tablet 500 mcg.     VITAMIN D PO Take 1,000 Units by mouth daily.     vitamin E 1000 UNIT capsule Take 1,000 Units by mouth daily.     No current facility-administered medications for this visit.    Allergies  Allergen Reactions   Codeine Nausea And Vomiting and Other (See Comments)    And Headache  Other reaction(s): Unknown Other reaction(s): Unknown   Ace Inhibitors     Drop in GFR Other reaction(s): drop in GFR Other reaction(s): drop in GFR   Atorvastatin     Body aches Other reaction(s): body aches Other reaction(s): body aches   Clarithromycin     Unable to sleep  Other reaction(s): unable to sleep Other reaction(s): unable to sleep   Hydroxychloroquine Sulfate Other (See Comments)    Per pt is has been so long ago, unsure reaction Other reaction(s): Itching Other reaction(s): Itching   Iodine     Other reaction(s): fungus? Other reaction(s): fungus? Other reaction(s): fungus?   Rosuvastatin     Body aches Other reaction(s): body aches   Avandamet [Rosiglitazone-Metformin] Rash and Other (See Comments)    Makes her hyper   Betadine [Povidone Iodine] Rash   Other Rash    Other reaction(s): body aches Other reaction(s): rash Other reaction(s): rash     REVIEW OF SYSTEMS:   [X] denotes positive finding, [ ] denotes negative finding Cardiac  Comments:  Chest pain or chest pressure:    Shortness of breath upon exertion:    Short of breath when lying flat:  Irregular heart rhythm:        Vascular    Pain in calf, thigh, or hip brought on by ambulation:    Pain in feet at night that wakes you up from your sleep:     Blood clot in your veins:    Leg swelling:         Pulmonary    Oxygen at home:    Productive cough:     Wheezing:         Neurologic    Sudden  weakness in arms or legs:     Sudden numbness in arms or legs:     Sudden onset of difficulty speaking or slurred speech:    Temporary loss of vision in one eye:     Problems with dizziness:         Gastrointestinal    Blood in stool:     Vomited blood:         Genitourinary    Burning when urinating:     Blood in urine:        Psychiatric    Major depression:         Hematologic    Bleeding problems:    Problems with blood clotting too easily:        Skin    Rashes or ulcers:        Constitutional    Fever or chills:      PHYSICAL EXAMINATION:  Vitals:   03/17/22 1438 03/17/22 1440  BP: (!) 148/84 135/84  Pulse: 70   Temp: 97.7 F (36.5 C)   TempSrc: Temporal   SpO2: 99%   Weight: 115 lb (52.2 kg)   Height: 5' 1" (1.549 m)     General:  WDWN in NAD; vital signs documented above Gait: Not observed HENT: WNL, normocephalic Pulmonary: normal non-labored breathing , without Rales, rhonchi,  wheezing Cardiac: regular HR Abdomen: soft, NT, no masses Skin: without rashes Vascular Exam/Pulses:  Right Left  Radial 2+ (normal) 2+ (normal)   Extremities: without ischemic changes, without Gangrene , without cellulitis; without open wounds;  Musculoskeletal: no muscle wasting or atrophy  Neurologic: A&O X 3;  CN grossly intact Psychiatric:  The pt has Normal affect.   Non-Invasive Vascular Imaging:   Bilateral internal carotid arteries 1 to 39% stenosis    ASSESSMENT/PLAN:: 76 y.o. female here for follow up for surveillance of carotid artery stenosis   -Subjectively the patient has not experienced any neurological symptoms since last office visit -Carotid duplex is also stable.  Her right carotid endarterectomy site is widely patent without any recurrent stenosis.  Left ICA is estimated to have 1 to 39% stenosis. -Continue aspirin daily -Recheck carotid duplex in 2 years   Dagoberto Ligas, PA-C [facility] [phone]  Clinic MD:    Stanford Breed

## 2022-03-25 DIAGNOSIS — E1169 Type 2 diabetes mellitus with other specified complication: Secondary | ICD-10-CM | POA: Diagnosis not present

## 2022-03-25 DIAGNOSIS — E782 Mixed hyperlipidemia: Secondary | ICD-10-CM | POA: Diagnosis not present

## 2022-03-25 DIAGNOSIS — I2510 Atherosclerotic heart disease of native coronary artery without angina pectoris: Secondary | ICD-10-CM | POA: Diagnosis not present

## 2022-03-25 DIAGNOSIS — Z6821 Body mass index (BMI) 21.0-21.9, adult: Secondary | ICD-10-CM | POA: Diagnosis not present

## 2022-04-20 DIAGNOSIS — D485 Neoplasm of uncertain behavior of skin: Secondary | ICD-10-CM | POA: Diagnosis not present

## 2022-04-20 DIAGNOSIS — L820 Inflamed seborrheic keratosis: Secondary | ICD-10-CM | POA: Diagnosis not present

## 2022-04-20 DIAGNOSIS — B079 Viral wart, unspecified: Secondary | ICD-10-CM | POA: Diagnosis not present

## 2022-05-15 DIAGNOSIS — M542 Cervicalgia: Secondary | ICD-10-CM | POA: Diagnosis not present

## 2022-05-15 DIAGNOSIS — M25512 Pain in left shoulder: Secondary | ICD-10-CM | POA: Diagnosis not present

## 2022-05-20 DIAGNOSIS — M25512 Pain in left shoulder: Secondary | ICD-10-CM | POA: Diagnosis not present

## 2022-05-22 ENCOUNTER — Ambulatory Visit
Admission: RE | Admit: 2022-05-22 | Discharge: 2022-05-22 | Disposition: A | Discharge status: Home / Self Care | Payer: Medicare Other | Source: Ambulatory Visit | Attending: Oncology | Admitting: Oncology

## 2022-05-22 ENCOUNTER — Ambulatory Visit: Payer: Medicare Other

## 2022-05-22 DIAGNOSIS — C50412 Malignant neoplasm of upper-outer quadrant of left female breast: Secondary | ICD-10-CM

## 2022-05-22 DIAGNOSIS — Z1231 Encounter for screening mammogram for malignant neoplasm of breast: Secondary | ICD-10-CM | POA: Diagnosis not present

## 2022-05-25 DIAGNOSIS — M25512 Pain in left shoulder: Secondary | ICD-10-CM | POA: Diagnosis not present

## 2022-05-27 ENCOUNTER — Other Ambulatory Visit: Payer: Self-pay | Admitting: Oncology

## 2022-05-27 ENCOUNTER — Encounter: Payer: Self-pay | Admitting: Oncology

## 2022-05-27 ENCOUNTER — Telehealth: Payer: Self-pay | Admitting: *Deleted

## 2022-05-27 ENCOUNTER — Inpatient Hospital Stay: Payer: Medicare Other | Attending: Oncology | Admitting: Oncology

## 2022-05-27 ENCOUNTER — Inpatient Hospital Stay: Payer: Medicare Other

## 2022-05-27 VITALS — BP 150/76 | HR 71 | Temp 98.1°F | Resp 18 | Ht 61.0 in | Wt 113.4 lb

## 2022-05-27 DIAGNOSIS — C50412 Malignant neoplasm of upper-outer quadrant of left female breast: Secondary | ICD-10-CM | POA: Diagnosis not present

## 2022-05-27 DIAGNOSIS — D751 Secondary polycythemia: Secondary | ICD-10-CM | POA: Insufficient documentation

## 2022-05-27 DIAGNOSIS — M8580 Other specified disorders of bone density and structure, unspecified site: Secondary | ICD-10-CM | POA: Insufficient documentation

## 2022-05-27 DIAGNOSIS — Z90710 Acquired absence of both cervix and uterus: Secondary | ICD-10-CM | POA: Insufficient documentation

## 2022-05-27 DIAGNOSIS — Z90722 Acquired absence of ovaries, bilateral: Secondary | ICD-10-CM | POA: Diagnosis not present

## 2022-05-27 DIAGNOSIS — Z170 Estrogen receptor positive status [ER+]: Secondary | ICD-10-CM

## 2022-05-27 DIAGNOSIS — L639 Alopecia areata, unspecified: Secondary | ICD-10-CM | POA: Insufficient documentation

## 2022-05-27 DIAGNOSIS — Z79899 Other long term (current) drug therapy: Secondary | ICD-10-CM | POA: Diagnosis not present

## 2022-05-27 DIAGNOSIS — Z85820 Personal history of malignant melanoma of skin: Secondary | ICD-10-CM | POA: Insufficient documentation

## 2022-05-27 DIAGNOSIS — Z9884 Bariatric surgery status: Secondary | ICD-10-CM | POA: Insufficient documentation

## 2022-05-27 DIAGNOSIS — Z853 Personal history of malignant neoplasm of breast: Secondary | ICD-10-CM | POA: Diagnosis not present

## 2022-05-27 DIAGNOSIS — G8912 Acute post-thoracotomy pain: Secondary | ICD-10-CM | POA: Diagnosis not present

## 2022-05-27 DIAGNOSIS — Z1231 Encounter for screening mammogram for malignant neoplasm of breast: Secondary | ICD-10-CM

## 2022-05-27 LAB — CMP (CANCER CENTER ONLY)
ALT: 13 U/L (ref 0–44)
AST: 14 U/L — ABNORMAL LOW (ref 15–41)
Albumin: 4.1 g/dL (ref 3.5–5.0)
Alkaline Phosphatase: 88 U/L (ref 38–126)
Anion gap: 12 (ref 5–15)
BUN: 32 mg/dL — ABNORMAL HIGH (ref 8–23)
CO2: 25 mmol/L (ref 22–32)
Calcium: 11.5 mg/dL — ABNORMAL HIGH (ref 8.9–10.3)
Chloride: 105 mmol/L (ref 98–111)
Creatinine: 1.08 mg/dL — ABNORMAL HIGH (ref 0.44–1.00)
GFR, Estimated: 54 mL/min — ABNORMAL LOW (ref >60)
Glucose, Bld: 207 mg/dL — ABNORMAL HIGH (ref 70–99)
Potassium: 5.2 mmol/L — ABNORMAL HIGH (ref 3.5–5.1)
Sodium: 142 mmol/L (ref 135–145)
Total Bilirubin: 0.6 mg/dL (ref 0.3–1.2)
Total Protein: 7.5 g/dL (ref 6.5–8.1)

## 2022-05-27 LAB — CBC WITH DIFFERENTIAL (CANCER CENTER ONLY)
Abs Immature Granulocytes: 0.17 10*3/uL — ABNORMAL HIGH (ref 0.00–0.07)
Basophils Absolute: 0.1 10*3/uL (ref 0.0–0.1)
Basophils Relative: 0 %
Eosinophils Absolute: 0 10*3/uL (ref 0.0–0.5)
Eosinophils Relative: 0 %
HCT: 49.1 % — ABNORMAL HIGH (ref 36.0–46.0)
Hemoglobin: 16.2 g/dL — ABNORMAL HIGH (ref 12.0–15.0)
Immature Granulocytes: 1 %
Lymphocytes Relative: 16 %
Lymphs Abs: 2.5 10*3/uL (ref 0.7–4.0)
MCH: 29.9 pg (ref 26.0–34.0)
MCHC: 33 g/dL (ref 30.0–36.0)
MCV: 90.8 fL (ref 80.0–100.0)
Monocytes Absolute: 1.1 10*3/uL — ABNORMAL HIGH (ref 0.1–1.0)
Monocytes Relative: 7 %
Neutro Abs: 12.3 10*3/uL — ABNORMAL HIGH (ref 1.7–7.7)
Neutrophils Relative %: 76 %
Platelet Count: 288 10*3/uL (ref 150–400)
RBC: 5.41 MIL/uL — ABNORMAL HIGH (ref 3.87–5.11)
RDW: 13 % (ref 11.5–15.5)
WBC Count: 16.1 10*3/uL — ABNORMAL HIGH (ref 4.0–10.5)
nRBC: 0 % (ref 0.0–0.2)

## 2022-05-27 NOTE — Telephone Encounter (Signed)
05/27/22 next appt scheduled and confirmed with patient

## 2022-05-27 NOTE — Progress Notes (Signed)
[facility]  [address] [place],  [place]  [address] [phone]  Clinic Day:05/27/22  Referring physician: Maury Dus, MD   CHIEF COMPLAINT:  CC: History of stage 0 left breast cancer  Current Treatment:  Surveillance  HISTORY OF PRESENT ILLNESS:  Faith Gaines is a 76 y.o. female with a history of stage 0 left breast cancer diagnosed in November of 2014 and treated with a lumpectomy in February of 2015. 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  This measured 0.1 cm for a Tis N0 N0.  She was offered chemoprevention but declined because of many other comorbidities.  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  She does have osteopenia and her last bone density scan was done in January of 2015. She gets these done through her gynecologist in [place].  In April of 2016, she had a total abdominal hysterectomy and bilateral salpingo-oophorectomy.  She did have a iron deficiency anemia in the past, but this corrected.  She tells me that she met with her primary care physician in December of 2016, and she was anemic again with a hemoglobin down to 11.  She was found to have iron deficiency.  She did complain of occasional soreness of her tongue, dizziness, and craving for ice.  She was placed on slow release iron after she was not able to tolerate ferrous sulfate.  She was fatigued and weak with dyspnea on exertion, and had daily headaches as well.  She had severe bleeding from the rectum which she attributes to hemorrhoids. Studies still showed evidence of iron deficiency and so she was given 2 injections of Feraheme at the end of December  2016, 1 week apart.  She has had endoscopy and colonoscopy through Dr. Collene Mares in [place] several years ago, so she was referred back, and had these repeated in January of 2017.  EGD revealed diffuse moderate gastritis  and colonoscopy revealed hemorrhoids.  She was told to avoid all nonsteroidal anti-inflammatory medications.    She has been diagnosed with lichen planopilaris and is using a liquid steroid from the dermatologist in [place], and topical Rogaine.  They referred her to [facility], and they felt this was immune mediated and placed her on hydroxychloroquine 200 mg twice daily.  She feels this is helping some.  She had a biopsy in the past of her right upper arm by a dermatologist in [place], Dr. Renda Rolls, and was told this was "pre melanoma".  She had a bone density scan 2 years ago, which apparently was not as good as previous.    She underwent triple bypass surgery in June 2021 and her hemoglobin was 9.0 postop.  They took veins from her left leg and left chest.  Since her surgery, she has had pain within that left breast and along the sternal incision.  When she was seen in September 2021, she was still having a lot of post-thoracotomy pain and so I placed her on a low dose of Elavil 25 mg.  She reported drowsiness.  Dr. Kipp Brood placed her on Lyrica 25 mg BID.  CT chest from January 2022 revealed mild emphysematous changes but no acute pulmonary findings or worrisome pulmonary lesions.  No mediastinal or hilar mass or adenopathy was observed.  I saw her in December 2022 and she was on gabapentin 100 mg twice daily without benefit.  I therefore  recommended a slow increase in the dose and she is now up to 300 mg twice daily with 3 pills at bedtime.  INTERVAL HISTORY:  Faith Gaines is here for follow up of her postthoracotomy pain syndrome and history of breast cancer. Her pain is well controlled at this time on Gabapentin. She received a series of cortisone injections into her sternotomy incision. This was very painful but she had good relief. She reports temporary pain relief with receiving an US guided cortisone injection to her left shoulder. Her pain intermittently radiates from her neck down  to her hand. Her shoulder pain is worsened when lying down on her left side. She notes that her RTC was intact on a recent left shoulder MRI but she has not yet had her neck evaluated. She states that she has not had labs since September 2023. She states that her CBC was WNL to her knowledge but she would like to repeat a CMP and CBC today. She recently changed primary care providers to Dr. Humphrey Rolls. Annual bilateral mammogram from November of 2023 was clear. She is seeing her cardiologist routinely and states that she is doing well from a CV perspective. She continues to have hair loss. 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 Her appetite is good, and her weight is down 2 pounds since 03/17/22. She denies fever, chills or other signs of infection. She denies nausea, vomiting, or abdominal pain. She denies sore throat, cough, dyspnea, or hemoptysis.   REVIEW OF SYSTEMS:  Review of Systems  Constitutional:  Negative for appetite change, chills, fever and unexpected weight change.  HENT:  Negative.         + hair loss  Eyes: Negative.   Respiratory: Negative.  Negative for chest tightness, cough, hemoptysis, shortness of breath and wheezing.   Cardiovascular: Negative.  Negative for chest pain, leg swelling and palpitations.  Gastrointestinal: Negative.  Negative for abdominal distention, abdominal pain, blood in stool, constipation, diarrhea, nausea and vomiting.  Endocrine: Negative.   Genitourinary: Negative.  Negative for difficulty urinating, dysuria, frequency and hematuria.   Musculoskeletal:  Positive for arthralgias (left shoulder), myalgias and neck pain. Negative for back pain, flank pain and gait problem.  Skin: Negative.   Neurological:  Negative for dizziness, extremity weakness, gait problem, headaches, light-headedness, numbness, seizures and speech difficulty.  Hematological: Negative.   Psychiatric/Behavioral: Negative.   Negative for depression and sleep disturbance. The patient is not nervous/anxious.      VITALS:  Blood pressure (!) 150/76, pulse 71, temperature 98.1 F (36.7 C), resp. rate 18, height _0  (1.549 m), weight 113 lb 6.4 oz (51.4 kg), SpO2 95 %.  Wt Readings from Last 3 Encounters:  05/27/22 113 lb 6.4 oz (51.4 kg)  03/17/22 115 lb (52.2 kg)  11/14/21 121 lb (54.9 kg)    Body mass index is 21.43 kg/m.  Performance status (ECOG): 1 - Symptomatic but completely ambulatory  PHYSICAL EXAM:  Physical Exam Exam conducted with a chaperone present.  Constitutional:      General: She is not in acute distress.    Appearance: Normal appearance. She is normal weight.  HENT:     Head: Normocephalic and atraumatic.     Comments: alopecia areata  Eyes:     General: No scleral icterus.    Extraocular Movements: Extraocular movements intact.     Conjunctiva/sclera: Conjunctivae normal.     Pupils: Pupils are equal, round, and reactive to  light.  Cardiovascular:     Rate and Rhythm: Normal rate and regular rhythm.     Pulses: Normal pulses.     Heart sounds: Normal heart sounds. No murmur heard.    No friction rub. No gallop.  Pulmonary:     Effort: Pulmonary effort is normal. No respiratory distress.     Breath sounds: Normal breath sounds.  Chest:  Breasts:    Right: Normal.     Left: Normal.     Comments: Sternotomy incision with a well healed thick scar. Nodular area at inferior aspect.  Well-healed scar over right axilla. Abdominal:     General: Bowel sounds are normal. There is no distension.     Palpations: Abdomen is soft. There is no hepatomegaly, splenomegaly or mass.     Tenderness: There is no abdominal tenderness.  Musculoskeletal:        General: Normal range of motion.     Cervical back: Normal range of motion and neck supple.     Right lower leg: No edema.     Left lower leg: No edema.  Lymphadenopathy:     Cervical: No cervical adenopathy.  Skin:    General:  Skin is warm and dry.  Neurological:     General: No focal deficit present.     Mental Status: She is alert and oriented to person, place, and time. Mental status is at baseline.  Psychiatric:        Mood and Affect: Mood normal.        Behavior: Behavior normal.        Thought Content: Thought content normal.        Judgment: Judgment normal.     LABS:      Latest Ref Rng & Units 05/27/2022    9:47 AM 02/04/2021   12:00 AM 11/26/2020    9:33 AM  CBC  WBC 4.0 - 10.5 K/uL 16.1  10.7    Hemoglobin 12.0 - 15.0 g/dL 16.2  16.5  16.7   Hematocrit 36.0 - 46.0 % 49.1  48  49.0   Platelets 150 - 400 K/uL 288  218        Latest Ref Rng & Units 05/27/2022    9:47 AM 03/11/2021    8:15 AM 02/04/2021   12:00 AM  CMP  Glucose 70 - 99 mg/dL 207     BUN 8 - 23 mg/dL 32   16   Creatinine 0.44 - 1.00 mg/dL 1.08   1.0   Sodium 135 - 145 mmol/L 142   141   Potassium 3.5 - 5.1 mmol/L 5.2   4.9   Chloride 98 - 111 mmol/L 105   101   CO2 22 - 32 mmol/L 25   29   Calcium 8.9 - 10.3 mg/dL 11.5   11.4   Total Protein 6.5 - 8.1 g/dL 7.5  6.8    Total Bilirubin 0.3 - 1.2 mg/dL 0.6  0.4    Alkaline Phos 38 - 126 U/L 88  89  68   AST 15 - 41 U/L _0 ALT 0 - 44 U/L _1 Lab Results  Component Value Date   TIBC 372 05/29/2020   FERRITIN 99 05/29/2020   IRONPCTSAT 20 05/29/2020   Lab Results  Component Value Date   HGBA1C 6.5 02/04/2021    STUDIES:   MM 3D SCREEN BREAST BILATERAL  Result Date: 05/25/2022 CLINICAL DATA:  Screening. EXAM: DIGITAL SCREENING BILATERAL MAMMOGRAM WITH TOMOSYNTHESIS AND CAD TECHNIQUE: Bilateral screening digital craniocaudal and mediolateral oblique mammograms were obtained. Bilateral screening digital breast tomosynthesis was performed. The images were evaluated with computer-aided detection. COMPARISON:  Previous exam(s). ACR Breast Density Category c: The breast tissue is heterogeneously dense, which may obscure small masses. FINDINGS: There are  no findings suspicious for malignancy. IMPRESSION: No mammographic evidence of malignancy. A result letter of this screening mammogram will be mailed directly to the patient. RECOMMENDATION: Screening mammogram in one year. (Code:SM-B-01Y) BI-RADS CATEGORY  1: Negative. Electronically Signed   By: Lillia Mountain M.D.   On: 05/25/2022 15:37     MM 3D SCREEN BREAST BILATERAL Result Date: 05/25/2022 CLINICAL DATA:  Screening. EXAM: DIGITAL SCREENING BILATERAL MAMMOGRAM WITH TOMOSYNTHESIS AND CAD TECHNIQUE: Bilateral screening digital craniocaudal and mediolateral oblique mammograms were obtained. Bilateral screening digital breast tomosynthesis was performed. The images were evaluated with computer-aided detection. COMPARISON:  Previous exam(s). ACR Breast Density Category c: The breast tissue is heterogeneously dense, which may obscure small masses. FINDINGS: There are no findings suspicious for malignancy. IMPRESSION: No mammographic evidence of malignancy. A result letter of this screening mammogram will be mailed directly to the patient. RECOMMENDATION: Screening mammogram in one year. (Code:SM-B-01Y) BI-RADS CATEGORY  1: Negative. Electronically Signed   By: Lillia Mountain M.D.   On: 05/25/2022 15:37     FINDINGS: There are no findings suspicious for malignancy.   IMPRESSION: No mammographic evidence of malignancy  Allergies:  Allergies  Allergen Reactions   Codeine Nausea And Vomiting and Other (See Comments)    And Headache   Other reaction(s): Unknown   Ace Inhibitors     Drop in GFR Other reaction(s): drop in GFR Other reaction(s): drop in GFR   Atorvastatin     Body aches Other reaction(s): body aches Other reaction(s): body aches   Clarithromycin     Unable to sleep  Other reaction(s): unable to sleep Other reaction(s): unable to sleep   Hydroxychloroquine Sulfate Other (See Comments)    Per pt is has been so long ago, unsure reaction Other reaction(s): Itching Other reaction(s):  Itching   Iodine     Other reaction(s): fungus? Other reaction(s): fungus? Other reaction(s): fungus?   Rosuvastatin     Body aches Other reaction(s): body aches   Avandamet [Rosiglitazone-Metformin] Rash and Other (See Comments)    Makes her hyper   Betadine [Povidone Iodine] Rash   Other Rash    Other reaction(s): body aches Other reaction(s): rash Other reaction(s): rash    Current Medications: Current Outpatient Medications  Medication Sig Dispense Refill   amitriptyline (ELAVIL) 50 MG tablet TAKE 1 TABLET BY MOUTH EVERYDAY AT BEDTIME 90 tablet 2   aspirin 81 MG tablet Take 1 tablet (81 mg total) by mouth daily.     Biotin 5000 MCG CAPS Take 2 capsules by mouth daily.     Blood Glucose Monitoring Suppl (FREESTYLE INSULINX SYSTEM) w/Device KIT See admin instructions.     canagliflozin (INVOKANA) 300 MG TABS tablet Take 300 mg by mouth daily.     Cholecalciferol (VITAMIN D3) 50 MCG (2000 UT) TABS Take 50 mcg by mouth daily.     Evolocumab (REPATHA SURECLICK) 673 MG/ML SOAJ Inject 140 mg into the skin every 14 (fourteen) days. 6 mL 3   famotidine (PEPCID) 40 MG tablet Take 40 mg by mouth at bedtime.     gabapentin (NEURONTIN) 300 MG capsule Take 1 capsule (300 mg total) by mouth 3 (three)  times daily. (Patient taking differently: Take 300 mg by mouth in the morning, at noon, in the evening, and at bedtime.) 270 capsule 2   metFORMIN (GLUCOPHAGE-XR) 500 MG 24 hr tablet Take 500 mg by mouth 2 (two) times daily.     Multiple Vitamins-Minerals (PRESERVISION AREDS 2 PO) Take 1 tablet by mouth daily.     nebivolol (BYSTOLIC) 5 MG tablet Take 5 mg by mouth daily. Takes with 85m to total 244m    Nebivolol HCl (BYSTOLIC) 20 MG TABS Take 1 tablet by mouth daily. Takes with 5 mg to total 25 mg     Omega-3 Fatty Acids (FISH OIL) 1000 MG CAPS Take 1,000 mg by mouth in the morning, at noon, in the evening, and at bedtime.     pantoprazole (PROTONIX) 40 MG tablet Take 40 mg by mouth daily.      pregabalin (LYRICA) 25 MG capsule Take 1 capsule by mouth 2 (two) times daily.     Semaglutide,0.25 or 0.5MG/DOS, (OZEMPIC, 0.25 OR 0.5 MG/DOSE,) 2 MG/1.5ML SOPN 0.25 MG     vitamin B-12 (CYANOCOBALAMIN) 1000 MCG tablet 500 mcg.     VITAMIN D PO Take 1,000 Units by mouth daily.     vitamin E 1000 UNIT capsule Take 1,000 Units by mouth daily.     No current facility-administered medications for this visit.     ASSESSMENT & PLAN:   Assessment:  1. Stage 0 breast cancer November 2014, treated with lumpectomy.  She has no evidence of disease.  2. 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 Resuming her Rogaine.  3. Hypercalcemia, stable, with normal vitamin-D level.   4. History of malignant melanoma resected from her left leg at age [age]No evidence of disease.  5. History of "pre melanoma" of her right upper arm resected.  The pathology revealed an inflamed junctional nevus with moderate atypia.  6. Osteopenia. She is taking oral vitamin D.  7. History of total abdominal hysterectomy and bilateral salpingo-oophorectomy.  8.  Triple bypass surgery in June 2021.  She completed cardiac rehab.  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  She is now on gabapentin at 300 mg twice daily and 3 pills at bedtime with fair relief.  9.  Left shoulder surgery for a torn tendon, May 2022.  She underwent rehab. Continues to have pain in the left shoulder. Had a recent left shoulder MRI which was unremarkable.  10. Severe pain of the left upper extremity. We suspect that this radiculopathy from the cervical spine. She will follow up with her surgeon.  Plan: She will continue the gabapentin for her postthoracotomy pain syndrome. Annual bilateral mammogram was benign. Will repeat bilateral screening mammogram prior to her annual visit with me. She understands and agrees with this plan of care.   I provided 20 minutes of face-to-face time  during this this encounter and > 50% was spent counseling as documented under my assessment and plan.    ChDerwood KaplanMD [facility] LLCT AS[facility] Center[address]ept: [phone] Fax: [phone]   I,Alexis Herring,acting as a scribe for ChDerwood KaplanMD.,have documented all relevant documentation on the behalf of ChDerwood KaplanMD,as directed by  ChDerwood KaplanMD while in the presence of ChDerwood KaplanMD.

## 2022-05-28 ENCOUNTER — Other Ambulatory Visit: Payer: Self-pay

## 2022-05-28 DIAGNOSIS — M542 Cervicalgia: Secondary | ICD-10-CM | POA: Diagnosis not present

## 2022-05-28 DIAGNOSIS — M5412 Radiculopathy, cervical region: Secondary | ICD-10-CM | POA: Diagnosis not present

## 2022-05-28 DIAGNOSIS — Z853 Personal history of malignant neoplasm of breast: Secondary | ICD-10-CM | POA: Diagnosis not present

## 2022-05-28 DIAGNOSIS — Z90710 Acquired absence of both cervix and uterus: Secondary | ICD-10-CM | POA: Diagnosis not present

## 2022-05-28 DIAGNOSIS — M25512 Pain in left shoulder: Secondary | ICD-10-CM | POA: Diagnosis not present

## 2022-05-28 DIAGNOSIS — Z85820 Personal history of malignant melanoma of skin: Secondary | ICD-10-CM | POA: Diagnosis not present

## 2022-05-28 DIAGNOSIS — M8580 Other specified disorders of bone density and structure, unspecified site: Secondary | ICD-10-CM | POA: Diagnosis not present

## 2022-05-28 DIAGNOSIS — L639 Alopecia areata, unspecified: Secondary | ICD-10-CM | POA: Diagnosis not present

## 2022-06-01 DIAGNOSIS — M5412 Radiculopathy, cervical region: Secondary | ICD-10-CM | POA: Diagnosis not present

## 2022-06-01 DIAGNOSIS — M25512 Pain in left shoulder: Secondary | ICD-10-CM | POA: Diagnosis not present

## 2022-06-01 DIAGNOSIS — M542 Cervicalgia: Secondary | ICD-10-CM | POA: Diagnosis not present

## 2022-06-03 DIAGNOSIS — M25512 Pain in left shoulder: Secondary | ICD-10-CM | POA: Diagnosis not present

## 2022-06-03 DIAGNOSIS — M5412 Radiculopathy, cervical region: Secondary | ICD-10-CM | POA: Diagnosis not present

## 2022-06-03 DIAGNOSIS — M542 Cervicalgia: Secondary | ICD-10-CM | POA: Diagnosis not present

## 2022-06-07 LAB — VITAMIN D 1,25 DIHYDROXY
Vitamin D 1, 25 (OH)2 Total: 37 pg/mL
Vitamin D2 1, 25 (OH)2: <10 pg/mL
Vitamin D3 1, 25 (OH)2: 34 pg/mL

## 2022-06-08 DIAGNOSIS — M25512 Pain in left shoulder: Secondary | ICD-10-CM | POA: Diagnosis not present

## 2022-06-08 DIAGNOSIS — M542 Cervicalgia: Secondary | ICD-10-CM | POA: Diagnosis not present

## 2022-06-08 DIAGNOSIS — M5412 Radiculopathy, cervical region: Secondary | ICD-10-CM | POA: Diagnosis not present

## 2022-06-15 DIAGNOSIS — M542 Cervicalgia: Secondary | ICD-10-CM | POA: Diagnosis not present

## 2022-06-15 DIAGNOSIS — M25512 Pain in left shoulder: Secondary | ICD-10-CM | POA: Diagnosis not present

## 2022-06-15 DIAGNOSIS — M5412 Radiculopathy, cervical region: Secondary | ICD-10-CM | POA: Diagnosis not present

## 2022-06-16 ENCOUNTER — Telehealth: Payer: Self-pay

## 2022-06-16 NOTE — Telephone Encounter (Signed)
-----   Message from Derwood Kaplan, MD sent at 06/16/2022  6:59 AM EST ----- Regarding: call Tell her BS was 207, and calcium was high, as usual. But she is more dehydrated and needs to drink more fluids. Her K is a little high, decrease in her diet. Rest is good. Cc to her PCP

## 2022-06-17 DIAGNOSIS — M542 Cervicalgia: Secondary | ICD-10-CM | POA: Diagnosis not present

## 2022-06-17 DIAGNOSIS — M5412 Radiculopathy, cervical region: Secondary | ICD-10-CM | POA: Diagnosis not present

## 2022-06-17 DIAGNOSIS — M25512 Pain in left shoulder: Secondary | ICD-10-CM | POA: Diagnosis not present

## 2022-06-18 ENCOUNTER — Telehealth: Payer: Self-pay

## 2022-06-18 DIAGNOSIS — M542 Cervicalgia: Secondary | ICD-10-CM | POA: Diagnosis not present

## 2022-06-18 NOTE — Telephone Encounter (Signed)
Attempted to contact patient. No answer and no VM.  

## 2022-06-18 NOTE — Telephone Encounter (Signed)
-----   Message from Derwood Kaplan, MD sent at 06/16/2022  6:59 AM EST ----- Regarding: call Tell her BS was 207, and calcium was high, as usual. But she is more dehydrated and needs to drink more fluids. Her K is a little high, decrease in her diet. Rest is good. Cc to her PCP

## 2022-06-22 DIAGNOSIS — M542 Cervicalgia: Secondary | ICD-10-CM | POA: Diagnosis not present

## 2022-06-22 DIAGNOSIS — M25512 Pain in left shoulder: Secondary | ICD-10-CM | POA: Diagnosis not present

## 2022-06-22 DIAGNOSIS — M5412 Radiculopathy, cervical region: Secondary | ICD-10-CM | POA: Diagnosis not present

## 2022-06-24 DIAGNOSIS — M542 Cervicalgia: Secondary | ICD-10-CM | POA: Diagnosis not present

## 2022-06-24 DIAGNOSIS — M25512 Pain in left shoulder: Secondary | ICD-10-CM | POA: Diagnosis not present

## 2022-06-25 DIAGNOSIS — M5022 Other cervical disc displacement, mid-cervical region, unspecified level: Secondary | ICD-10-CM | POA: Diagnosis not present

## 2022-06-25 DIAGNOSIS — M5412 Radiculopathy, cervical region: Secondary | ICD-10-CM | POA: Diagnosis not present

## 2022-07-10 DIAGNOSIS — M5412 Radiculopathy, cervical region: Secondary | ICD-10-CM | POA: Diagnosis not present

## 2022-07-10 DIAGNOSIS — M5022 Other cervical disc displacement, mid-cervical region, unspecified level: Secondary | ICD-10-CM | POA: Diagnosis not present

## 2022-07-29 DIAGNOSIS — M5412 Radiculopathy, cervical region: Secondary | ICD-10-CM | POA: Diagnosis not present

## 2022-07-31 DIAGNOSIS — M5412 Radiculopathy, cervical region: Secondary | ICD-10-CM | POA: Diagnosis not present

## 2022-08-05 ENCOUNTER — Other Ambulatory Visit: Payer: Self-pay | Admitting: Orthopedic Surgery

## 2022-08-08 DIAGNOSIS — H00019 Hordeolum externum unspecified eye, unspecified eyelid: Secondary | ICD-10-CM | POA: Diagnosis not present

## 2022-08-13 ENCOUNTER — Telehealth: Payer: Self-pay | Admitting: *Deleted

## 2022-08-13 NOTE — Telephone Encounter (Signed)
   Pre-operative Risk Assessment    Patient Name: Faith Gaines  DOB: [birth year] MRN: [id number]      Request for Surgical Clearance    Procedure:   ANTERIOR CERVICAL DECOMPRESSION FUSION C4-5, C5-6, C6-7 WITH INSTRUMENTATION AND ALLOGRAFT  Date of Surgery:  Clearance 08/27/22                                 Surgeon:  DR. MARK DUMONSKI Surgeon's Group or Practice Name:  [facility] Phone number:  [phone] Fax number:  [phone] ATTN: Neita Garnet   Type of Clearance Requested:   - Medical ; ASA    Type of Anesthesia:  General    Additional requests/questions:    Jiles Prows   08/13/2022, 2:31 PM

## 2022-08-13 NOTE — Telephone Encounter (Signed)
Pt scheduled for tele pre op appt 08/18/22, due to procedure date and med hold.  Med rec and consent are done.    Patient Consent for Virtual Visit        Faith Gaines has provided verbal consent on 08/13/2022 for a virtual visit (video or telephone).   CONSENT FOR VIRTUAL VISIT FOR:  Faith Gaines  By participating in this virtual visit I agree to the following:  I hereby voluntarily request, consent and authorize [facility] and its employed or contracted physicians, physician assistants, nurse practitioners or other licensed health care professionals (the Practitioner), to provide me with telemedicine health care services (the "Services") as deemed necessary by the treating Practitioner. I acknowledge and consent to receive the Services by the Practitioner via telemedicine. I understand that the telemedicine visit will involve communicating with the Practitioner through live audiovisual communication technology and the disclosure of certain medical information by electronic transmission. I acknowledge that I have been given the opportunity to request an in-person assessment or other available alternative prior to the telemedicine visit and am voluntarily participating in the telemedicine visit.  I understand that I have the right to withhold or withdraw my consent to the use of telemedicine in the course of my care at any time, without affecting my right to future care or treatment, and that the Practitioner or I may terminate the telemedicine visit at any time. I understand that I have the right to inspect all information obtained and/or recorded in the course of the telemedicine visit and may receive copies of available information for a reasonable fee.  I understand that some of the potential risks of receiving the Services via telemedicine include:  Delay or interruption in medical evaluation due to technological equipment failure or disruption; Information  transmitted may not be sufficient (e.g. poor resolution of images) to allow for appropriate medical decision making by the Practitioner; and/or  In rare instances, security protocols could fail, causing a breach of personal health information.  Furthermore, I acknowledge that it is my responsibility to provide information about my medical history, conditions and care that is complete and accurate to the best of my ability. I acknowledge that Practitioner's advice, recommendations, and/or decision may be based on factors not within their control, such as incomplete or inaccurate data provided by me or distortions of diagnostic images or specimens that may result from electronic transmissions. I understand that the practice of medicine is not an exact science and that Practitioner makes no warranties or guarantees regarding treatment outcomes. I acknowledge that a copy of this consent can be made available to me via my patient portal ([facility]), or I can request a printed copy by calling the office of [facility].    I understand that my insurance will be billed for this visit.   I have read or had this consent read to me. I understand the contents of this consent, which adequately explains the benefits and risks of the Services being provided via telemedicine.  I have been provided ample opportunity to ask questions regarding this consent and the Services and have had my questions answered to my satisfaction. I give my informed consent for the services to be provided through the use of telemedicine in my medical care

## 2022-08-13 NOTE — Telephone Encounter (Signed)
Pt scheduled for tele pre op appt 08/18/22, due to procedure date and med hold.  Med rec and consent are done.

## 2022-08-13 NOTE — Telephone Encounter (Signed)
   Name: Kellyjo Edgren  DOB: 11-12-1945  MRN: [id number]  Primary Cardiologist: Minus Breeding, MD  Chart reviewed as part of pre-operative protocol coverage. Because of Lezette Kitts Baeten's past medical history and time since last visit, she will require a follow-up telephone visit in order to better assess preoperative cardiovascular risk.  Pre-op covering staff: - Please schedule appointment and call patient to inform them. If patient already had an upcoming appointment within acceptable timeframe, please add "pre-op clearance" to the appointment notes so provider is aware. - Please contact requesting surgeon's office via preferred method (i.e, phone, fax) to inform them of need for appointment prior to surgery.  Okay to hold ASA x 7 days if needed for upcoming surgery. She is on ASA for Hx of CABG.   Elgie Collard, PA-C  08/13/2022, 3:28 PM

## 2022-08-14 ENCOUNTER — Other Ambulatory Visit (HOSPITAL_COMMUNITY): Payer: Self-pay

## 2022-08-17 NOTE — Progress Notes (Unsigned)
Virtual Visit via Telephone Note   Because of Faith Gaines's co-morbid illnesses, she is at least at moderate risk for complications without adequate follow up.  This format is felt to be most appropriate for this patient at this time.  The patient did not have access to video technology/had technical difficulties with video requiring transitioning to audio format only (telephone).  All issues noted in this document were discussed and addressed.  No physical exam could be performed with this format.  Please refer to the patient's chart for her consent to telehealth for [facility].  Evaluation Performed:  Preoperative cardiovascular risk assessment _____________   Date:  08/18/2022   Patient ID:  Faith Gaines, DOB May 04, 1946, MRN [id number] Patient Location:  Home Provider location:   Office  Primary Care Provider:  [name], MD Primary Cardiologist:  Minus Breeding, MD  Chief Complaint / Patient Profile   77 y.o. y/o female with a h/o CAD s/p CABG x 3 in 2021, type 2 DM, carotid artery stenosis, dyslipidemia,  who is pending anterior cervical decompression fusion C4-5, C5-6, C6-7 with instrumentation and allograft and presents today for telephonic preoperative cardiovascular risk assessment.  History of Present Illness    Faith Gaines is a 77 y.o. female who presents via audio/video conferencing for a telehealth visit today.  Pt was last seen in cardiology clinic on 11/14/21 by Dr. Percival Spanish.  At that time Linnae Rasool Currier was doing well.  The patient is now pending procedure as outlined above. Since her last visit, she  denies chest pain, shortness of breath, lower extremity edema, fatigue, palpitations, melena, hematuria, hemoptysis, diaphoresis, weakness, presyncope, syncope, orthopnea, and PND. She remains active with house work and yard work and is able to achieve > 4 METS activity without concerning cardiac symptoms.    Past Medical History    Past Medical History:  Diagnosis Date   Anxiety    Bicipital tendonitis of left shoulder    Bilateral carotid artery stenosis without cerebral infarction    vascular--- dr Kellie Simmering---- s/p right CEA 12/ 2014;  last duplex in epic 07-15-2020  post right CEA with 1-39% stenosis and left ICA 1-39%   CKD (chronic kidney disease), stage III ([place])    followed by pcp   Coronary artery disease cardiologist--- dr Warren Lacy   02-01-2020 CABG x3   Depression    DOE (dyspnea on exertion)    per pt still healing from CABG 07/ 2021   GERD (gastroesophageal reflux disease)    History of left breast cancer oncologist--- dr c. Hinton Rao ([facility])   dx 2015;   08-22-2013 s/p left partial masectomy, Stage 0, low grade DCIS,  no chemo/ radiation   History of malignant melanoma 1982   excision left leg, per pt localized   History of skin cancer    per pt multiple excision's of BCC, SCC, and non-malignant melanoma   Hyperlipidemia    Hypertension    Iron deficiency anemia    hemotology/ oncology---- dr Hinton Rao----   has had iron infusions   OA (osteoarthritis)    Osteopenia    Peripheral vascular disease ([place])    vascular--- dr Kellie Simmering   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   LIMA -- LAD,  SVG -- D1,  SVG -- OM1   Type 2 diabetes mellitus ([place])    followed by pcp--- (11-21-2020  per pt check blood sugar daily in am,  fasting sugar-- 80--150)  Past Surgical History:  Procedure Laterality Date   ANTERIOR AND POSTERIOR REPAIR N/A 10/22/2014   Procedure: CYSTO, REPAIR CYSTOCELE/RECTOCELE;  Surgeon: Bjorn Loser, MD;  Location: [facility];  Service: Urology;  Laterality: N/A;   BLADDER SUSPENSION  05/1981   ABDOMINAL MARSHALL-MARCHETTI- KRANTZ PROCEDURE, per pt   COLONOSCOPY     CORONARY ARTERY BYPASS GRAFT N/A 02/01/2020   Procedure: CORONARY ARTERY BYPASS GRAFTING (CABG) using LIMA to LAD; Endovein Harvested Greater Saphenous Vein: SVG to Diag1; SVG to OM1.;   Surgeon: Lajuana Matte, MD;  Location: [place];  Service: Open Heart Surgery;  Laterality: N/A;   ENDARTERECTOMY Right 06/29/2013   Procedure: ENDARTERECTOMY CAROTID-RIGHT, WITH DACRON PATCH ANGIOPLASTY;  Surgeon: Mal Misty, MD;  Location[facility];  Service: Vascular;  Laterality: Right;   ENDOVEIN HARVEST OF GREATER SAPHENOUS VEIN Left 02/01/2020   Procedure: ENDOVEIN HARVEST OF GREATER SAPHENOUS VEIN;  Surgeon: Lajuana Matte, MD;  Location: [place];  Service: Open Heart Surgery;  Laterality: Left;   LAPAROSCOPIC ASSISTED VAGINAL HYSTERECTOMY N/A 10/22/2014   Procedure: LAPAROSCOPIC ASSISTED VAGINAL HYSTERECTOMY;  Surgeon: Marylynn Pearson, MD;  Location: [facility];  Service: Gynecology;  Laterality: N/A;  Pam from Dr. Mikle Bosworth office will call and add additional surgery for this patient in our block time.   LAPAROSCOPIC CHOLECYSTECTOMY  1985   W/  APPENDECTOMY   LEFT HEART CATH AND CORONARY ANGIOGRAPHY N/A 01/26/2020   Procedure: LEFT HEART CATH AND CORONARY ANGIOGRAPHY;  Surgeon: [name], MD;  Location: [place];  Service: Cardiovascular;  Laterality: N/A;   MELANOMA EXCISION  12/1980   left leg,   PARTIAL MASTECTOMY WITH NEEDLE LOCALIZATION Left 08/22/2013   Procedure: PARTIAL MASTECTOMY WITH NEEDLE LOCALIZATION;  Surgeon: Stark Klein, MD;  Location: [place];  Service: General;  Laterality: Left;   SALPINGOOPHORECTOMY Bilateral 10/22/2014   Procedure: SALPINGO OOPHORECTOMY;  Surgeon: Marylynn Pearson, MD;  Location: [facility];  Service: Gynecology;  Laterality: Bilateral;   SHOULDER ARTHROSCOPY Left 11/26/2020   Procedure: ARTHROSCOPY SHOULDER BICEPS TENODESIS;  Surgeon: Renette Butters, MD;  Location: [facility];  Service: Orthopedics;  Laterality: Left;   TEE WITHOUT CARDIOVERSION N/A 02/01/2020   Procedure: TRANSESOPHAGEAL ECHOCARDIOGRAM (TEE);  Surgeon: Lajuana Matte, MD;  Location: [place];  Service: Open Heart Surgery;  Laterality: N/A;   TUBAL  LIGATION     VAGINAL PROLAPSE REPAIR N/A 10/22/2014   Procedure: VAULT PROLAPSE WITH GRAFT;  Surgeon: Bjorn Loser, MD;  Location: [facility];  Service: Urology;  Laterality: N/A;   WISDOM TOOTH EXTRACTION      Allergies  Allergies  Allergen Reactions   Codeine Nausea And Vomiting and Other (See Comments)    And Headache   Other reaction(s): Unknown   Ace Inhibitors     Drop in GFR Other reaction(s): drop in GFR Other reaction(s): drop in GFR   Atorvastatin     Body aches Other reaction(s): body aches Other reaction(s): body aches   Clarithromycin     Unable to sleep  Other reaction(s): unable to sleep Other reaction(s): unable to sleep   Hydroxychloroquine Sulfate Other (See Comments)    Per pt is has been so long ago, unsure reaction Other reaction(s): Itching Other reaction(s): Itching   Iodine     Other reaction(s): fungus? Other reaction(s): fungus? Other reaction(s): fungus?   Rosuvastatin     Body aches Other reaction(s): body aches   Avandamet [Rosiglitazone-Metformin] Rash and Other (See Comments)    Makes her hyper  Betadine [Povidone Iodine] Rash   Other Rash    Other reaction(s): body aches Other reaction(s): rash Other reaction(s): rash    Home Medications    Prior to Admission medications   Medication Sig Start Date End Date Taking? Authorizing Provider  amitriptyline (ELAVIL) 50 MG tablet TAKE 1 TABLET BY MOUTH EVERYDAY AT BEDTIME 02/21/21   Derwood Kaplan, MD  aspirin 81 MG tablet Take 1 tablet (81 mg total) by mouth daily. 11/11/20   Minus Breeding, MD  Biotin 5000 MCG CAPS Take 2 capsules by mouth daily.    [provider]  Blood Glucose Monitoring Suppl ([place]) w/Device KIT See admin instructions. 11/17/16   [provider]  canagliflozin (INVOKANA) 300 MG TABS tablet Take 300 mg by mouth daily.    [provider]  Cholecalciferol (VITAMIN D3) 50 MCG (2000 UT) TABS Take 50 mcg by mouth daily.     [provider]  Evolocumab (REPATHA SURECLICK) 431 MG/ML SOAJ Inject 140 mg into the skin every 14 (fourteen) days. 11/14/21   Minus Breeding, MD  famotidine (PEPCID) 40 MG tablet Take 40 mg by mouth at bedtime.    [provider]  gabapentin (NEURONTIN) 300 MG capsule Take 1 capsule (300 mg total) by mouth 3 (three) times daily. Patient taking differently: Take 300 mg by mouth in the morning, at noon, in the evening, and at bedtime. 10/03/21   Derwood Kaplan, MD  metFORMIN (GLUCOPHAGE-XR) 500 MG 24 hr tablet Take 500 mg by mouth 2 (two) times daily. 09/15/21   [provider]  Multiple Vitamins-Minerals (PRESERVISION AREDS 2 PO) Take 1 tablet by mouth daily.    [provider]  nebivolol (BYSTOLIC) 5 MG tablet Take 5 mg by mouth daily. Takes with '20mg'$  to total '25mg'$     [provider]  Nebivolol HCl (BYSTOLIC) 20 MG TABS Take 1 tablet by mouth daily. Takes with 5 mg to total 25 mg    [provider]  Omega-3 Fatty Acids (FISH OIL) 1000 MG CAPS Take 1,000 mg by mouth in the morning, at noon, in the evening, and at bedtime.    [provider]  pantoprazole (PROTONIX) 40 MG tablet Take 40 mg by mouth daily.    [provider]  pregabalin (LYRICA) 25 MG capsule Take 1 capsule by mouth 2 (two) times daily.    [provider]  Semaglutide,0.25 or 0.'5MG'$ /DOS, (OZEMPIC, 0.25 OR 0.5 MG/DOSE,) 2 MG/1.5ML SOPN 0.25 MG 09/10/21   [provider]  vitamin B-12 (CYANOCOBALAMIN) 1000 MCG tablet 500 mcg.    [provider]  VITAMIN D PO Take 1,000 Units by mouth daily.    [provider]  vitamin E 1000 UNIT capsule Take 1,000 Units by mouth daily.    [provider]    Physical Exam    Vital Signs:  Jerzey Komperda Runion does not have vital signs available for review today.  Given telephonic nature of communication, physical exam is limited. AAOx3. NAD. Normal affect.  Speech and  respirations are unlabored.  Accessory Clinical Findings    None  Assessment & Plan    1.  Preoperative Cardiovascular Risk Assessment: The patient is doing well from a cardiac perspective. Therefore, based on ACC/AHA guidelines, the patient would be at acceptable risk for the planned procedure without further cardiovascular testing. According to the Revised Cardiac Risk Index (RCRI), her Perioperative Risk of Major Cardiac Event is (%): 6.6 Her Functional Capacity in METs is: 6.91  according to the [place] Activity Status Index (DASI).  The patient was advised that if she develops new symptoms prior to surgery to contact our office to arrange for a follow-up visit, and she verbalized understanding.  She may hold aspirin for 7 days prior to procedure and should resume as soon as hemodynamically stable postoperatively.  A copy of this note will be routed to requesting surgeon.  Time:   Today, I have spent 10 minutes with the patient with telehealth technology discussing medical history, symptoms, and management plan.    Emmaline Life, NP-C  08/18/2022, 9:24 AM [address], [address] Office [phone] Fax [phone]

## 2022-08-18 ENCOUNTER — Encounter: Payer: Self-pay | Admitting: Nurse Practitioner

## 2022-08-18 ENCOUNTER — Ambulatory Visit: Payer: Medicare Other | Attending: Internal Medicine | Admitting: Nurse Practitioner

## 2022-08-18 DIAGNOSIS — Z01810 Encounter for preprocedural cardiovascular examination: Secondary | ICD-10-CM | POA: Diagnosis not present

## 2022-08-19 NOTE — Pre-Procedure Instructions (Signed)
Surgical Instructions    Your procedure is scheduled on Thursday, February 8.  Report to [facility] Main Entrance "A" at 7:45 A.M., then check in with the Admitting office.  Call this number if you have problems the morning of surgery:  [phone]   If you have any questions prior to your surgery date call [phone]: Open Monday-Friday 8am-4pm If you experience any cold or flu symptoms such as cough, fever, chills, shortness of breath, etc. between now and your scheduled surgery, please notify Faith Gaines at the above number     Remember:  Do not eat after midnight the night before your surgery  You may drink clear liquids until 7:45AM the morning of your surgery.   Clear liquids allowed are: Water, Non-Citrus Juices (without pulp), Carbonated Beverages, Clear Tea, Black Coffee ONLY (NO MILK, CREAM OR POWDERED CREAMER of any kind), and Gatorade    Take these medicines the morning of surgery with A SIP OF WATER:  gabapentin (NEURONTIN)  nebivolol (BYSTOLIC)  pantoprazole (PROTONIX)   Follow your surgeon's instructions on when to stop Aspirin.  If no instructions were given by your surgeon then you will need to call the office to get those instructions.    As of today, STOP taking any Aleve, Naproxen, Ibuprofen, Motrin, Advil, Goody's, BC's, all herbal medications, fish oil, and all vitamins.          WHAT DO I DO ABOUT MY DIABETES MEDICATION?   Do not take oral diabetes medicines (pills) the morning of surgery. DO NOT TAKE canagliflozin (INVOKANA) or metFORMIN (GLUCOPHAGE-XR) the day of surgery.   DO NOT TAKE canagliflozin (INVOKANA) for 72 hours prior to surgery. Your last dose should be on 08/23/22.  The day of surgery, do not take other diabetes injectables, including Byetta (exenatide), Bydureon (exenatide ER), Victoza (liraglutide), or Trulicity (dulaglutide). DO NOT TAKE Semaglutide for 7 days prior to surgery. Do not take after 08/19/22.  If your CBG is greater than 220 mg/dL,  you may take  of your sliding scale (correction) dose of insulin.   HOW TO MANAGE YOUR DIABETES BEFORE AND AFTER SURGERY  Why is it important to control my blood sugar before and after surgery? Improving blood sugar levels before and after surgery helps healing and can limit problems. A way of improving blood sugar control is eating a healthy diet by:  Eating less sugar and carbohydrates  Increasing activity/exercise  Talking with your doctor about reaching your blood sugar goals High blood sugars (greater than 180 mg/dL) can raise your risk of infections and slow your recovery, so you will need to focus on controlling your diabetes during the weeks before surgery. Make sure that the doctor who takes care of your diabetes knows about your planned surgery including the date and location.  How do I manage my blood sugar before surgery? Check your blood sugar at least 4 times a day, starting 2 days before surgery, to make sure that the level is not too high or low.  Check your blood sugar the morning of your surgery when you wake up and every 2 hours until you get to the Short Stay unit.  If your blood sugar is less than 70 mg/dL, you will need to treat for low blood sugar: Do not take insulin. Treat a low blood sugar (less than 70 mg/dL) with  cup of clear juice (cranberry or apple), 4 glucose tablets, OR glucose gel. Recheck blood sugar in 15 minutes after treatment (to make sure it is greater  than 70 mg/dL). If your blood sugar is not greater than 70 mg/dL on recheck, call [phone] for further instructions. Report your blood sugar to the short stay nurse when you get to Short Stay.  If you are admitted to the hospital after surgery: Your blood sugar will be checked by the staff and you will probably be given insulin after surgery (instead of oral diabetes medicines) to make sure you have good blood sugar levels. The goal for blood sugar control after surgery is 80-180  mg/dL.    [facility] is not responsible for any belongings or valuables.    Do NOT Smoke (Tobacco/Vaping)  24 hours prior to your procedure  If you use a CPAP at night, you may bring your mask for your overnight stay.   Contacts, glasses, hearing aids, dentures or partials may not be worn into surgery, please bring cases for these belongings   For patients admitted to the hospital, discharge time will be determined by your treatment team.   Patients discharged the day of surgery will not be allowed to drive home, and someone needs to stay with them for 24 hours.   SURGICAL WAITING ROOM VISITATION Patients having surgery or a procedure may have no more than 2 support people in the waiting area - these visitors may rotate.   Children under the age of 15 must have an adult with them who is not the patient. If the patient needs to stay at the hospital during part of their recovery, the visitor guidelines for inpatient rooms apply. Pre-op nurse will coordinate an appropriate time for 1 support person to accompany patient in pre-op.  This support person may not rotate.   Please refer to [email or web address] for the visitor guidelines for Inpatients (after your surgery is over and you are in a regular room).    Special instructions:    Oral Hygiene is also important to reduce your risk of infection.  Remember - BRUSH YOUR TEETH THE MORNING OF SURGERY WITH YOUR REGULAR TOOTHPASTE   [facility]- Preparing For Surgery  Before surgery, you can play an important role. Because skin is not sterile, your skin needs to be as free of germs as possible. You can reduce the number of germs on your skin by washing with CHG (chlorahexidine gluconate) Soap before surgery.  CHG is an antiseptic cleaner which kills germs and bonds with the skin to continue killing germs even after washing.     Please do not use if you have an allergy to CHG or  antibacterial soaps. If your skin becomes reddened/irritated stop using the CHG.  Do not shave (including legs and underarms) for at least 48 hours prior to first CHG shower. It is OK to shave your face.  Please follow these instructions carefully.     Shower the NIGHT BEFORE SURGERY and the MORNING OF SURGERY with CHG Soap.   If you chose to wash your hair, wash your hair first as usual with your normal shampoo. After you shampoo, rinse your hair and body thoroughly to remove the shampoo.  Then [employer] and genitals (private parts) with your normal soap and rinse thoroughly to remove soap.  After that Use CHG Soap as you would any other liquid soap. You can apply CHG directly to the skin and wash gently with a scrungie or a clean washcloth.   Apply the CHG Soap to your body ONLY FROM THE NECK DOWN.  Do not use on open wounds or open sores. Avoid contact  with your eyes, ears, mouth and genitals (private parts). Wash Face and genitals (private parts)  with your normal soap.   Wash thoroughly, paying special attention to the area where your surgery will be performed.  Thoroughly rinse your body with warm water from the neck down.  DO NOT shower/wash with your normal soap after using and rinsing off the CHG Soap.  Pat yourself dry with a CLEAN TOWEL.  Wear CLEAN PAJAMAS to bed the night before surgery  Place CLEAN SHEETS on your bed the night before your surgery  DO NOT SLEEP WITH PETS.   Day of Surgery:  Take a shower with CHG soap. Wear Clean/Comfortable clothing the morning of surgery Do not wear jewelry or makeup. Do not wear lotions, powders, perfumes/cologne or deodorant. Do not shave 48 hours prior to surgery.  Men may shave face and neck. Do not bring valuables to the hospital. Do not wear nail polish, gel polish, artificial nails, or any other type of covering on natural nails (fingers and toes) If you have artificial nails or gel coating that need to be removed by a nail  salon, please have this removed prior to surgery. Artificial nails or gel coating may interfere with anesthesia's ability to adequately monitor your vital signs. Remember to brush your teeth WITH YOUR REGULAR TOOTHPASTE.    If you received a COVID test during your pre-op visit, it is requested that you wear a mask when out in public, stay away from anyone that may not be feeling well, and notify your surgeon if you develop symptoms. If you have been in contact with anyone that has tested positive in the last 10 days, please notify your surgeon.    Please read over the following fact sheets that you were given.

## 2022-08-20 ENCOUNTER — Encounter (HOSPITAL_COMMUNITY)
Admission: RE | Admit: 2022-08-20 | Discharge: 2022-08-20 | Disposition: A | Discharge status: Home / Self Care | Payer: Medicare Other | Source: Ambulatory Visit | Attending: Orthopedic Surgery | Admitting: Orthopedic Surgery

## 2022-08-20 ENCOUNTER — Other Ambulatory Visit: Payer: Self-pay

## 2022-08-20 ENCOUNTER — Encounter (HOSPITAL_COMMUNITY): Payer: Self-pay

## 2022-08-20 VITALS — BP 133/78 | HR 67 | Temp 97.5°F | Resp 18 | Ht 61.0 in | Wt 114.3 lb

## 2022-08-20 DIAGNOSIS — E1151 Type 2 diabetes mellitus with diabetic peripheral angiopathy without gangrene: Secondary | ICD-10-CM | POA: Diagnosis not present

## 2022-08-20 DIAGNOSIS — Z951 Presence of aortocoronary bypass graft: Secondary | ICD-10-CM | POA: Diagnosis not present

## 2022-08-20 DIAGNOSIS — Z79899 Other long term (current) drug therapy: Secondary | ICD-10-CM | POA: Insufficient documentation

## 2022-08-20 DIAGNOSIS — Z01812 Encounter for preprocedural laboratory examination: Secondary | ICD-10-CM | POA: Diagnosis not present

## 2022-08-20 DIAGNOSIS — Z01818 Encounter for other preprocedural examination: Secondary | ICD-10-CM

## 2022-08-20 LAB — BASIC METABOLIC PANEL
Anion gap: 6 (ref 5–15)
BUN: 11 mg/dL (ref 8–23)
CO2: 29 mmol/L (ref 22–32)
Calcium: 10.5 mg/dL — ABNORMAL HIGH (ref 8.9–10.3)
Chloride: 106 mmol/L (ref 98–111)
Creatinine, Ser: 0.81 mg/dL (ref 0.44–1.00)
GFR, Estimated: >60 mL/min (ref >60)
Glucose, Bld: 161 mg/dL — ABNORMAL HIGH (ref 70–99)
Potassium: 4.5 mmol/L (ref 3.5–5.1)
Sodium: 141 mmol/L (ref 135–145)

## 2022-08-20 LAB — SURGICAL PCR SCREEN
MRSA, PCR: NEGATIVE
Staphylococcus aureus: NEGATIVE

## 2022-08-20 LAB — CBC
HCT: 47.9 % — ABNORMAL HIGH (ref 36.0–46.0)
Hemoglobin: 15.8 g/dL — ABNORMAL HIGH (ref 12.0–15.0)
MCH: 31.1 pg (ref 26.0–34.0)
MCHC: 33 g/dL (ref 30.0–36.0)
MCV: 94.3 fL (ref 80.0–100.0)
Platelets: 231 10*3/uL (ref 150–400)
RBC: 5.08 MIL/uL (ref 3.87–5.11)
RDW: 13.6 % (ref 11.5–15.5)
WBC: 10.1 10*3/uL (ref 4.0–10.5)
nRBC: 0 % (ref 0.0–0.2)

## 2022-08-20 LAB — HEMOGLOBIN A1C
Hgb A1c MFr Bld: 7.8 % — ABNORMAL HIGH (ref 4.8–5.6)
Mean Plasma Glucose: 177.16 mg/dL

## 2022-08-20 LAB — TYPE AND SCREEN
ABO/RH(D): O POS
Antibody Screen: NEGATIVE

## 2022-08-20 LAB — GLUCOSE, CAPILLARY: Glucose-Capillary: 188 mg/dL — ABNORMAL HIGH (ref 70–99)

## 2022-08-20 NOTE — Progress Notes (Signed)
PCP - Bertram Millard Cardiologist - Minus Breeding  PPM/ICD - denies  Chest x-ray - n/a EKG - 11/14/21 Stress Test - denies ECHO - 02/02/20 Cardiac Cath - 01/26/20  Sleep Study - denies  Fasting Blood Sugar - 120-130s Checks Blood Sugar once a day  Last dose of Ozempic was 08/11/22.  Follow your surgeon's instructions on when to stop Aspirin.  If no instructions were given by your surgeon then you will need to call the office to get those instructions.    Last dose of aspirin was 08/19/22.   As of today, STOP taking any Aleve, Naproxen, Ibuprofen, Motrin, Advil, Goody's, BC's, all herbal medications, fish oil, and all vitamins.  ERAS Protcol -yes PRE-SURGERY Ensure or G2- g2 ordered and given  COVID TEST- not needed   Anesthesia review: yes, cardiac history, cardiac clearance in [facility] 08/13/22.  Patient denies shortness of breath, fever, cough and chest pain at PAT appointment   All instructions explained to the patient, with a verbal understanding of the material. Patient agrees to go over the instructions while at home for a better understanding. Patient also instructed to self quarantine after being tested for COVID-19. The opportunity to ask questions was provided.

## 2022-08-21 NOTE — Anesthesia Preprocedure Evaluation (Signed)
Anesthesia Evaluation  Patient identified by MRN, date of birth, ID band Patient awake    Reviewed: Allergy & Precautions, NPO status , Patient's Chart, lab work & pertinent test results  Airway Mallampati: II  TM Distance: >3 FB Neck ROM: Full    Dental  (+) Dental Advisory Given   Pulmonary former smoker   breath sounds clear to auscultation       Cardiovascular Exercise Tolerance: Good hypertension, Pt. on medications and Pt. on home beta blockers + CAD, + CABG and + Peripheral Vascular Disease   Rhythm:Regular Rate:Normal     Neuro/Psych  Neuromuscular disease    GI/Hepatic Neg liver ROS,GERD  Medicated,,  Endo/Other  diabetes, Type 2, Oral Hypoglycemic Agents    Renal/GU Renal disease     Musculoskeletal  (+) Arthritis ,    Abdominal   Peds  Hematology negative hematology ROS (+)   Anesthesia Other Findings   Reproductive/Obstetrics                             Anesthesia Physical Anesthesia Plan  ASA: 3  Anesthesia Plan: General   Post-op Pain Management: Tylenol PO (pre-op)*   Induction: Intravenous  PONV Risk Score and Plan: 3 and Dexamethasone, Ondansetron and Treatment may vary due to age or medical condition  Airway Management Planned: Oral ETT and Video Laryngoscope Planned  Additional Equipment:   Intra-op Plan:   Post-operative Plan: Extubation in OR  Informed Consent: I have reviewed the patients History and Physical, chart, labs and discussed the procedure including the risks, benefits and alternatives for the proposed anesthesia with the patient or authorized representative who has indicated his/her understanding and acceptance.     Dental advisory given  Plan Discussed with: CRNA  Anesthesia Plan Comments: (PAT note by Karoline Caldwell, PA-C:  Follows with cardiology for history of CAD s/p CABG x 3 in 2021, type 2 DM, carotid artery stenosis/P right CEA  2014, dyslipidemia.  Seen by Christen Bame, NP on 08/18/2022 via televisit for preop evaluation.  Per note, "Preoperative Cardiovascular Risk Assessment: The patient is doing well from a cardiac perspective. Therefore, based on ACC/AHA guidelines, the patient would be at acceptable risk for the planned procedure without further cardiovascular testing.According to the Revised Cardiac Risk Index (RCRI),herPerioperative Risk of Major Cardiac Event is (%): 6.6. HerFunctional Capacity in METs is: 6.91according to the Duke Activity Status Index (DASI). The patient was advised that ifshedevelops new symptoms prior to surgery to contact our office to arrange for a follow-up visit, and sheverbalized understanding. She may hold aspirin for 7 days prior to procedure and should resume as soon as hemodynamically stable postoperatively."  Non-insulin-dependent DM2, A1c 7.8 on preop labs.  She is on once weekly GLP-1 agonist Ozempic.  Last dose reported 08/11/2022.  Preop labs reviewed, unremarkable.  EKG 11/14/2021: NSR.  Rate 64.  LAD.  Anterior infarct, age undetermined.  Carotid duplex 03/17/2022: Summary:  Right Carotid: Velocities in the right ICA are consistent with a 1-39% stenosis.   Left Carotid: Velocities in the left ICA are consistent with a 1-39% stenosis.   Vertebrals:Bilateral vertebral arteries demonstrate antegrade flow.  Subclavians: Normal flow hemodynamics were seen in bilateral subclavian arteries.   Intraoperative TEE 02/01/2020: POST-OP IMPRESSIONS  - Aorta: The aorta appears unchanged from pre-bypass.  - Aortic Valve: The aortic valve appears unchanged from pre-bypass.  - Mitral Valve: The mitral valve appears unchanged from pre-bypass.  - Tricuspid Valve: The tricuspid  valve appears unchanged from pre-bypass.  There is moderate regurgitation.   TTE 01/27/2020: 1. Left ventricular ejection fraction, by estimation, is 60 to 65%. The  left ventricle has normal function. The  left ventricle has no regional  wall motion abnormalities. Left ventricular diastolic parameters were  normal.  2. Right ventricular systolic function is normal. The right ventricular  size is normal. There is normal pulmonary artery systolic pressure.  3. The mitral valve is normal in structure. Trivial mitral valve  regurgitation. No evidence of mitral stenosis.  4. The aortic valve is tricuspid. Aortic valve regurgitation is not  visualized. Mild aortic valve sclerosis is present, with no evidence of  aortic valve stenosis.  5. The inferior vena cava is normal in size with greater than 50%  respiratory variability, suggesting right atrial pressure of 3 mmHg.    )        Anesthesia Quick Evaluation

## 2022-08-21 NOTE — Progress Notes (Signed)
Anesthesia Chart Review:  Follows with cardiology for history of CAD s/p CABG x 3 in 2021, type 2 DM, carotid artery stenosis/P right CEA 2014, dyslipidemia.  Seen by Christen Bame, NP on 08/18/2022 via televisit for preop evaluation.  Per note, "Preoperative Cardiovascular Risk Assessment: The patient is doing well from a cardiac perspective. Therefore, based on ACC/AHA guidelines, the patient would be at acceptable risk for the planned procedure without further cardiovascular testing. According to the Revised Cardiac Risk Index (RCRI), her Perioperative Risk of Major Cardiac Event is (%): 6.6. Her Functional Capacity in METs is: 6.91 according to the Duke Activity Status Index (DASI). The patient was advised that if she develops new symptoms prior to surgery to contact our office to arrange for a follow-up visit, and she verbalized understanding. She may hold aspirin for 7 days prior to procedure and should resume as soon as hemodynamically stable postoperatively."  Non-insulin-dependent DM2, A1c 7.8 on preop labs.  She is on once weekly GLP-1 agonist Ozempic.  Last dose reported 08/11/2022.  Preop labs reviewed, unremarkable.  EKG 11/14/2021: NSR.  Rate 64.  LAD.  Anterior infarct, age undetermined.  Carotid duplex 03/17/2022: Summary:  Right Carotid: Velocities in the right ICA are consistent with a 1-39% stenosis.   Left Carotid: Velocities in the left ICA are consistent with a 1-39% stenosis.   Vertebrals: Bilateral vertebral arteries demonstrate antegrade flow.  Subclavians: Normal flow hemodynamics were seen in bilateral subclavian arteries.   Intraoperative TEE 02/01/2020: POST-OP IMPRESSIONS  - Aorta: The aorta appears unchanged from pre-bypass.  - Aortic Valve: The aortic valve appears unchanged from pre-bypass.  - Mitral Valve: The mitral valve appears unchanged from pre-bypass.  - Tricuspid Valve: The tricuspid valve appears unchanged from pre-bypass.  There is moderate  regurgitation.   TTE 01/27/2020:  1. Left ventricular ejection fraction, by estimation, is 60 to 65%. The  left ventricle has normal function. The left ventricle has no regional  wall motion abnormalities. Left ventricular diastolic parameters were  normal.   2. Right ventricular systolic function is normal. The right ventricular  size is normal. There is normal pulmonary artery systolic pressure.   3. The mitral valve is normal in structure. Trivial mitral valve  regurgitation. No evidence of mitral stenosis.   4. The aortic valve is tricuspid. Aortic valve regurgitation is not  visualized. Mild aortic valve sclerosis is present, with no evidence of  aortic valve stenosis.   5. The inferior vena cava is normal in size with greater than 50%  respiratory variability, suggesting right atrial pressure of 3 mmHg.     Wynonia Musty [facility] Short Stay Center/Anesthesiology Phone [phone] 08/21/2022 3:16 PM

## 2022-08-27 ENCOUNTER — Encounter (HOSPITAL_COMMUNITY)
Admission: RE | Disposition: A | Discharge status: Home / Self Care | Payer: Self-pay | Source: Home / Self Care | Attending: Orthopedic Surgery

## 2022-08-27 ENCOUNTER — Other Ambulatory Visit: Payer: Self-pay

## 2022-08-27 ENCOUNTER — Inpatient Hospital Stay (HOSPITAL_COMMUNITY): Payer: Medicare Other

## 2022-08-27 ENCOUNTER — Encounter (HOSPITAL_COMMUNITY): Payer: Self-pay | Admitting: Orthopedic Surgery

## 2022-08-27 ENCOUNTER — Inpatient Hospital Stay (HOSPITAL_COMMUNITY): Payer: Medicare Other | Admitting: Physician Assistant

## 2022-08-27 ENCOUNTER — Inpatient Hospital Stay (HOSPITAL_COMMUNITY)
Admission: RE | Admit: 2022-08-27 | Discharge: 2022-08-27 | DRG: 472 | Disposition: A | Discharge status: Home / Self Care | Payer: Medicare Other | Attending: Orthopedic Surgery | Admitting: Orthopedic Surgery

## 2022-08-27 DIAGNOSIS — Z85820 Personal history of malignant melanoma of skin: Secondary | ICD-10-CM

## 2022-08-27 DIAGNOSIS — M8580 Other specified disorders of bone density and structure, unspecified site: Secondary | ICD-10-CM | POA: Diagnosis present

## 2022-08-27 DIAGNOSIS — I129 Hypertensive chronic kidney disease with stage 1 through stage 4 chronic kidney disease, or unspecified chronic kidney disease: Secondary | ICD-10-CM | POA: Diagnosis present

## 2022-08-27 DIAGNOSIS — Z85828 Personal history of other malignant neoplasm of skin: Secondary | ICD-10-CM

## 2022-08-27 DIAGNOSIS — Z853 Personal history of malignant neoplasm of breast: Secondary | ICD-10-CM | POA: Diagnosis not present

## 2022-08-27 DIAGNOSIS — E1151 Type 2 diabetes mellitus with diabetic peripheral angiopathy without gangrene: Secondary | ICD-10-CM | POA: Diagnosis present

## 2022-08-27 DIAGNOSIS — M5412 Radiculopathy, cervical region: Secondary | ICD-10-CM | POA: Diagnosis present

## 2022-08-27 DIAGNOSIS — Z823 Family history of stroke: Secondary | ICD-10-CM

## 2022-08-27 DIAGNOSIS — G992 Myelopathy in diseases classified elsewhere: Secondary | ICD-10-CM | POA: Diagnosis present

## 2022-08-27 DIAGNOSIS — Z87891 Personal history of nicotine dependence: Secondary | ICD-10-CM

## 2022-08-27 DIAGNOSIS — Z801 Family history of malignant neoplasm of trachea, bronchus and lung: Secondary | ICD-10-CM | POA: Diagnosis not present

## 2022-08-27 DIAGNOSIS — I2510 Atherosclerotic heart disease of native coronary artery without angina pectoris: Secondary | ICD-10-CM | POA: Diagnosis present

## 2022-08-27 DIAGNOSIS — M4802 Spinal stenosis, cervical region: Principal | ICD-10-CM | POA: Diagnosis present

## 2022-08-27 DIAGNOSIS — Z7984 Long term (current) use of oral hypoglycemic drugs: Secondary | ICD-10-CM | POA: Diagnosis not present

## 2022-08-27 DIAGNOSIS — E1122 Type 2 diabetes mellitus with diabetic chronic kidney disease: Secondary | ICD-10-CM | POA: Diagnosis present

## 2022-08-27 DIAGNOSIS — M4322 Fusion of spine, cervical region: Secondary | ICD-10-CM | POA: Diagnosis not present

## 2022-08-27 DIAGNOSIS — E785 Hyperlipidemia, unspecified: Secondary | ICD-10-CM | POA: Diagnosis present

## 2022-08-27 DIAGNOSIS — K219 Gastro-esophageal reflux disease without esophagitis: Secondary | ICD-10-CM | POA: Diagnosis present

## 2022-08-27 DIAGNOSIS — I10 Essential (primary) hypertension: Secondary | ICD-10-CM | POA: Diagnosis not present

## 2022-08-27 DIAGNOSIS — F32A Depression, unspecified: Secondary | ICD-10-CM | POA: Diagnosis present

## 2022-08-27 DIAGNOSIS — G709 Myoneural disorder, unspecified: Secondary | ICD-10-CM | POA: Diagnosis present

## 2022-08-27 DIAGNOSIS — Z951 Presence of aortocoronary bypass graft: Secondary | ICD-10-CM

## 2022-08-27 DIAGNOSIS — E1149 Type 2 diabetes mellitus with other diabetic neurological complication: Secondary | ICD-10-CM | POA: Diagnosis not present

## 2022-08-27 DIAGNOSIS — F419 Anxiety disorder, unspecified: Secondary | ICD-10-CM | POA: Diagnosis present

## 2022-08-27 DIAGNOSIS — M9971 Connective tissue and disc stenosis of intervertebral foramina of cervical region: Secondary | ICD-10-CM | POA: Diagnosis not present

## 2022-08-27 DIAGNOSIS — Z8049 Family history of malignant neoplasm of other genital organs: Secondary | ICD-10-CM

## 2022-08-27 DIAGNOSIS — Z79899 Other long term (current) drug therapy: Secondary | ICD-10-CM

## 2022-08-27 DIAGNOSIS — Z803 Family history of malignant neoplasm of breast: Secondary | ICD-10-CM | POA: Diagnosis not present

## 2022-08-27 DIAGNOSIS — Z7982 Long term (current) use of aspirin: Secondary | ICD-10-CM

## 2022-08-27 DIAGNOSIS — Z841 Family history of disorders of kidney and ureter: Secondary | ICD-10-CM | POA: Diagnosis not present

## 2022-08-27 DIAGNOSIS — N183 Chronic kidney disease, stage 3 unspecified: Secondary | ICD-10-CM | POA: Diagnosis present

## 2022-08-27 DIAGNOSIS — M4712 Other spondylosis with myelopathy, cervical region: Principal | ICD-10-CM

## 2022-08-27 DIAGNOSIS — Z833 Family history of diabetes mellitus: Secondary | ICD-10-CM

## 2022-08-27 DIAGNOSIS — Z8249 Family history of ischemic heart disease and other diseases of the circulatory system: Secondary | ICD-10-CM

## 2022-08-27 DIAGNOSIS — Z885 Allergy status to narcotic agent status: Secondary | ICD-10-CM

## 2022-08-27 DIAGNOSIS — M1990 Unspecified osteoarthritis, unspecified site: Secondary | ICD-10-CM | POA: Diagnosis present

## 2022-08-27 DIAGNOSIS — G959 Disease of spinal cord, unspecified: Secondary | ICD-10-CM | POA: Diagnosis not present

## 2022-08-27 DIAGNOSIS — Z825 Family history of asthma and other chronic lower respiratory diseases: Secondary | ICD-10-CM | POA: Diagnosis not present

## 2022-08-27 HISTORY — PX: ANTERIOR CERVICAL DECOMPRESSION/DISCECTOMY FUSION 4 LEVELS: SHX5556

## 2022-08-27 LAB — GLUCOSE, CAPILLARY
Glucose-Capillary: 197 mg/dL — ABNORMAL HIGH (ref 70–99)
Glucose-Capillary: 160 mg/dL — ABNORMAL HIGH (ref 70–99)
Glucose-Capillary: 101 mg/dL — ABNORMAL HIGH (ref 70–99)
Glucose-Capillary: 86 mg/dL (ref 70–99)

## 2022-08-27 SURGERY — ANTERIOR CERVICAL DECOMPRESSION/DISCECTOMY FUSION 4 LEVELS
Anesthesia: General

## 2022-08-27 MED ORDER — AMISULPRIDE (ANTIEMETIC) 5 MG/2ML IV SOLN
INTRAVENOUS | Status: AC
  Filled 2022-08-27: qty 4

## 2022-08-27 MED ORDER — LACTATED RINGERS IV SOLN: INTRAVENOUS | Status: DC

## 2022-08-27 MED ORDER — LIDOCAINE 2% (20 MG/ML) 5 ML SYRINGE
INTRAMUSCULAR | Status: DC | PRN
  Administered 2022-08-27 (×2): 20 mg via INTRAVENOUS
  Administered 2022-08-27: 10 mg via INTRAVENOUS

## 2022-08-27 MED ORDER — BUPIVACAINE HCL (PF) 0.25 % IJ SOLN
INTRAMUSCULAR | Status: AC
  Filled 2022-08-27: qty 30

## 2022-08-27 MED ORDER — ORAL CARE MOUTH RINSE: 15.0000 mL | Freq: Once | OROMUCOSAL | Status: AC

## 2022-08-27 MED ORDER — PROPOFOL 10 MG/ML IV BOLUS
INTRAVENOUS | Status: AC
  Filled 2022-08-27: qty 20

## 2022-08-27 MED ORDER — SURGIFLO WITH THROMBIN (HEMOSTATIC MATRIX KIT) OPTIME
TOPICAL | Status: DC | PRN
  Administered 2022-08-27: 1

## 2022-08-27 MED ORDER — HYDROCODONE-ACETAMINOPHEN 5-325 MG PO TABS: 1.0000 | ORAL_TABLET | Freq: Four times a day (QID) | ORAL | 0 refills | Status: AC | PRN

## 2022-08-27 MED ORDER — PHENYLEPHRINE 80 MCG/ML (10ML) SYRINGE FOR IV PUSH (FOR BLOOD PRESSURE SUPPORT)
PREFILLED_SYRINGE | INTRAVENOUS | Status: AC
  Filled 2022-08-27: qty 10

## 2022-08-27 MED ORDER — PROPOFOL 10 MG/ML IV BOLUS
INTRAVENOUS | Status: DC | PRN
  Administered 2022-08-27: 10 mg via INTRAVENOUS
  Administered 2022-08-27: 5 mg via INTRAVENOUS
  Administered 2022-08-27: 50 mg via INTRAVENOUS
  Administered 2022-08-27: 5 mg via INTRAVENOUS

## 2022-08-27 MED ORDER — CHLORHEXIDINE GLUCONATE 0.12 % MT SOLN
OROMUCOSAL | Status: AC
  Administered 2022-08-27: 15 mL via OROMUCOSAL
  Filled 2022-08-27: qty 15

## 2022-08-27 MED ORDER — CEFAZOLIN SODIUM-DEXTROSE 2-4 GM/100ML-% IV SOLN
2.0000 g | INTRAVENOUS | Status: AC
  Administered 2022-08-27: 2 g via INTRAVENOUS

## 2022-08-27 MED ORDER — CHLORHEXIDINE GLUCONATE 0.12 % MT SOLN: 15.0000 mL | Freq: Once | OROMUCOSAL | Status: AC

## 2022-08-27 MED ORDER — PHENYLEPHRINE 80 MCG/ML (10ML) SYRINGE FOR IV PUSH (FOR BLOOD PRESSURE SUPPORT)
PREFILLED_SYRINGE | INTRAVENOUS | Status: DC | PRN
  Administered 2022-08-27 (×2): 80 ug via INTRAVENOUS

## 2022-08-27 MED ORDER — METHOCARBAMOL 750 MG PO TABS: 750.0000 mg | ORAL_TABLET | Freq: Four times a day (QID) | ORAL | 0 refills | Status: AC | PRN

## 2022-08-27 MED ORDER — ROCURONIUM BROMIDE 10 MG/ML (PF) SYRINGE
PREFILLED_SYRINGE | INTRAVENOUS | Status: DC | PRN
  Administered 2022-08-27: 30 mg via INTRAVENOUS
  Administered 2022-08-27: 20 mg via INTRAVENOUS
  Administered 2022-08-27 (×2): 30 mg via INTRAVENOUS

## 2022-08-27 MED ORDER — PHENYLEPHRINE HCL-NACL 20-0.9 MG/250ML-% IV SOLN
INTRAVENOUS | Status: DC | PRN
  Administered 2022-08-27: 25 ug/min via INTRAVENOUS

## 2022-08-27 MED ORDER — HYDROCODONE-ACETAMINOPHEN 5-325 MG PO TABS
1.0000 | ORAL_TABLET | Freq: Once | ORAL | Status: AC
  Administered 2022-08-27: 1 via ORAL

## 2022-08-27 MED ORDER — CEFAZOLIN SODIUM-DEXTROSE 2-4 GM/100ML-% IV SOLN
INTRAVENOUS | Status: AC
  Filled 2022-08-27: qty 100

## 2022-08-27 MED ORDER — ONDANSETRON HCL 4 MG/2ML IJ SOLN
INTRAMUSCULAR | Status: DC | PRN
  Administered 2022-08-27: 4 mg via INTRAVENOUS

## 2022-08-27 MED ORDER — AMISULPRIDE (ANTIEMETIC) 5 MG/2ML IV SOLN
10.0000 mg | Freq: Once | INTRAVENOUS | Status: AC | PRN
  Administered 2022-08-27: 10 mg via INTRAVENOUS

## 2022-08-27 MED ORDER — FENTANYL CITRATE (PF) 250 MCG/5ML IJ SOLN
INTRAMUSCULAR | Status: DC | PRN
  Administered 2022-08-27: 75 ug via INTRAVENOUS
  Administered 2022-08-27: 50 ug via INTRAVENOUS
  Administered 2022-08-27: 25 ug via INTRAVENOUS
  Administered 2022-08-27: 50 ug via INTRAVENOUS

## 2022-08-27 MED ORDER — HYDROCODONE-ACETAMINOPHEN 5-325 MG PO TABS
ORAL_TABLET | ORAL | Status: AC
  Filled 2022-08-27: qty 1

## 2022-08-27 MED ORDER — ACETAMINOPHEN 500 MG PO TABS
1000.0000 mg | ORAL_TABLET | Freq: Once | ORAL | Status: AC
  Administered 2022-08-27: 1000 mg via ORAL
  Filled 2022-08-27: qty 2

## 2022-08-27 MED ORDER — ONDANSETRON HCL 4 MG/2ML IJ SOLN
INTRAMUSCULAR | Status: AC
  Filled 2022-08-27: qty 2

## 2022-08-27 MED ORDER — KETAMINE HCL 50 MG/5ML IJ SOSY
PREFILLED_SYRINGE | INTRAMUSCULAR | Status: AC
  Filled 2022-08-27: qty 5

## 2022-08-27 MED ORDER — LIDOCAINE 2% (20 MG/ML) 5 ML SYRINGE
INTRAMUSCULAR | Status: AC
  Filled 2022-08-27: qty 5

## 2022-08-27 MED ORDER — KETAMINE HCL 10 MG/ML IJ SOLN
INTRAMUSCULAR | Status: DC | PRN
  Administered 2022-08-27: 10 mg via INTRAVENOUS
  Administered 2022-08-27: 15 mg via INTRAVENOUS

## 2022-08-27 MED ORDER — 0.9 % SODIUM CHLORIDE (POUR BTL) OPTIME
TOPICAL | Status: DC | PRN
  Administered 2022-08-27: 1000 mL

## 2022-08-27 MED ORDER — FENTANYL CITRATE (PF) 100 MCG/2ML IJ SOLN
25.0000 ug | INTRAMUSCULAR | Status: DC | PRN
  Administered 2022-08-27: 25 ug via INTRAVENOUS

## 2022-08-27 MED ORDER — THROMBIN 20000 UNITS EX SOLR
CUTANEOUS | Status: DC | PRN
  Administered 2022-08-27: 20 mL

## 2022-08-27 MED ORDER — BUPIVACAINE-EPINEPHRINE 0.25% -1:200000 IJ SOLN
INTRAMUSCULAR | Status: DC | PRN
  Administered 2022-08-27: 7 mL

## 2022-08-27 MED ORDER — FENTANYL CITRATE (PF) 100 MCG/2ML IJ SOLN
INTRAMUSCULAR | Status: AC
  Filled 2022-08-27: qty 2

## 2022-08-27 MED ORDER — SUGAMMADEX SODIUM 200 MG/2ML IV SOLN
INTRAVENOUS | Status: DC | PRN
  Administered 2022-08-27: 360 mg via INTRAVENOUS

## 2022-08-27 MED ORDER — THROMBIN 20000 UNITS EX SOLR
CUTANEOUS | Status: AC
  Filled 2022-08-27: qty 20000

## 2022-08-27 MED ORDER — ONDANSETRON HCL 4 MG PO TABS: 4.0000 mg | ORAL_TABLET | Freq: Three times a day (TID) | ORAL | 0 refills | Status: AC | PRN

## 2022-08-27 MED ORDER — INSULIN ASPART 100 UNIT/ML IJ SOLN
0.0000 [IU] | INTRAMUSCULAR | Status: DC | PRN
  Administered 2022-08-27: 2 [IU] via SUBCUTANEOUS
  Filled 2022-08-27: qty 1

## 2022-08-27 MED ORDER — ROCURONIUM BROMIDE 10 MG/ML (PF) SYRINGE
PREFILLED_SYRINGE | INTRAVENOUS | Status: AC
  Filled 2022-08-27: qty 10

## 2022-08-27 MED ORDER — FENTANYL CITRATE (PF) 250 MCG/5ML IJ SOLN
INTRAMUSCULAR | Status: AC
  Filled 2022-08-27: qty 5

## 2022-08-27 SURGICAL SUPPLY — 79 items
AGENT HMST KT MTR STRL THRMB (HEMOSTASIS) ×1
APL SKNCLS STERI-STRIP NONHPOA (GAUZE/BANDAGES/DRESSINGS) ×1
BAG COUNTER SPONGE SURGICOUNT (BAG) ×1 IMPLANT
BAG SPNG CNTER NS LX DISP (BAG) ×1
BENZOIN TINCTURE PRP APPL 2/3 (GAUZE/BANDAGES/DRESSINGS) IMPLANT
BIT DRILL NEURO 2X3.1 SFT TUCH (MISCELLANEOUS) ×1 IMPLANT
BIT DRILL SRG 14X2.2XFLT CHK (BIT) IMPLANT
BIT DRL SRG 14X2.2XFLT CHK (BIT) ×2
BLADE CLIPPER SURG (BLADE) ×1 IMPLANT
BLADE SURG 15 STRL LF DISP TIS (BLADE) ×1 IMPLANT
BLADE SURG 15 STRL SS (BLADE) ×1
BONE VIVIGEN FORMABLE 1.3CC (Bone Implant) ×2 IMPLANT
BUR MATCHSTICK NEURO 3.0 LAGG (BURR) IMPLANT
COVER SURGICAL LIGHT HANDLE (MISCELLANEOUS) ×1 IMPLANT
DEVICE ENDSKLTN IMPLANT SM 7MM (Cage) IMPLANT
DRAIN JACKSON RD 7FR 3/32 (WOUND CARE) IMPLANT
DRAPE C-ARM 42X72 X-RAY (DRAPES) ×1 IMPLANT
DRAPE POUCH INSTRU U-SHP 10X18 (DRAPES) ×1 IMPLANT
DRAPE SURG 17X23 STRL (DRAPES) ×3 IMPLANT
DRILL BIT SKYLINE 14MM (BIT) ×2
DRILL NEURO 2X3.1 SOFT TOUCH (MISCELLANEOUS) ×1
DURAPREP 26ML APPLICATOR (WOUND CARE) ×1 IMPLANT
ELECT COATED BLADE 2.86 ST (ELECTRODE) ×1 IMPLANT
ELECT REM PT RETURN 9FT ADLT (ELECTROSURGICAL) ×1
ELECTRODE REM PT RTRN 9FT ADLT (ELECTROSURGICAL) ×1 IMPLANT
ENDOSKELETON IMPLANT SM 7MM (Cage) ×1 IMPLANT
EVACUATOR SILICONE 100CC (DRAIN) IMPLANT
GAUZE 4X4 16PLY ~~LOC~~+RFID DBL (SPONGE) ×1 IMPLANT
GAUZE SPONGE 4X4 12PLY STRL (GAUZE/BANDAGES/DRESSINGS) ×1 IMPLANT
GLOVE BIO SURGEON STRL SZ7 (GLOVE) ×1 IMPLANT
GLOVE BIO SURGEON STRL SZ8 (GLOVE) ×1 IMPLANT
GLOVE BIOGEL PI IND STRL 7.0 (GLOVE) ×2 IMPLANT
GLOVE BIOGEL PI IND STRL 8 (GLOVE) ×1 IMPLANT
GLOVE SURG ENC MOIS LTX SZ6.5 (GLOVE) ×1 IMPLANT
GOWN STRL REUS W/ TWL LRG LVL3 (GOWN DISPOSABLE) ×1 IMPLANT
GOWN STRL REUS W/ TWL XL LVL3 (GOWN DISPOSABLE) ×1 IMPLANT
GOWN STRL REUS W/TWL LRG LVL3 (GOWN DISPOSABLE) ×1
GOWN STRL REUS W/TWL XL LVL3 (GOWN DISPOSABLE) ×1
GRAFT BNE MATRIX VG FRMBL SM 1 (Bone Implant) IMPLANT
INTERLOCK LRDTC CRVCL VBR 6MM (Bone Implant) IMPLANT
INTERLOCK LRDTC CRVCL VBR 8MM (Peek) IMPLANT
IV CATH 14GX2 1/4 (CATHETERS) ×1 IMPLANT
KIT BASIN OR (CUSTOM PROCEDURE TRAY) ×1 IMPLANT
KIT TURNOVER KIT B (KITS) ×1 IMPLANT
LORDOTIC CERVICAL VBR 6MM SM (Bone Implant) ×1 IMPLANT
LORDOTIC CERVICAL VBR 8MM SM (Peek) ×1 IMPLANT
MANIFOLD NEPTUNE II (INSTRUMENTS) ×1 IMPLANT
NDL PRECISIONGLIDE 27X1.5 (NEEDLE) ×1 IMPLANT
NDL SPNL 20GX3.5 QUINCKE YW (NEEDLE) ×1 IMPLANT
NEEDLE PRECISIONGLIDE 27X1.5 (NEEDLE) ×1 IMPLANT
NEEDLE SPNL 20GX3.5 QUINCKE YW (NEEDLE) ×1 IMPLANT
NS IRRIG 1000ML POUR BTL (IV SOLUTION) ×1 IMPLANT
PACK ORTHO CERVICAL (CUSTOM PROCEDURE TRAY) ×1 IMPLANT
PAD ARMBOARD 7.5X6 YLW CONV (MISCELLANEOUS) ×2 IMPLANT
PATTIES SURGICAL .5 X.5 (GAUZE/BANDAGES/DRESSINGS) IMPLANT
PIN DISTRACTION 14 (PIN) IMPLANT
PLATE SKYLINE 3LVL 45MM CERV (Plate) IMPLANT
POSITIONER HEAD DONUT 9IN (MISCELLANEOUS) ×1 IMPLANT
PUTTY DBX 1CC (Putty) ×1 IMPLANT
PUTTY DBX 1CC DEPUY (Putty) IMPLANT
SCREW SKYLINE VAR OS 14MM (Screw) IMPLANT
SCREW VAR SELF TAP SKYLINE 14M (Screw) IMPLANT
SPIKE FLUID TRANSFER (MISCELLANEOUS) ×1 IMPLANT
SPONGE INTESTINAL PEANUT (DISPOSABLE) ×2 IMPLANT
SPONGE SURGIFOAM ABS GEL 100 (HEMOSTASIS) ×1 IMPLANT
STRIP CLOSURE SKIN 1/2X4 (GAUZE/BANDAGES/DRESSINGS) IMPLANT
SURGIFLO W/THROMBIN 8M KIT (HEMOSTASIS) IMPLANT
SUT MNCRL AB 4-0 PS2 18 (SUTURE) IMPLANT
SUT VIC AB 2-0 CT2 18 VCP726D (SUTURE) ×1 IMPLANT
SYR BULB IRRIG 60ML STRL (SYRINGE) ×1 IMPLANT
SYR CONTROL 10ML LL (SYRINGE) ×2 IMPLANT
TAPE CLOTH 4X10 WHT NS (GAUZE/BANDAGES/DRESSINGS) ×1 IMPLANT
TAPE CLOTH SURG 6X10 WHT LF (GAUZE/BANDAGES/DRESSINGS) IMPLANT
TAPE UMBILICAL COTTON 1/8X30 (MISCELLANEOUS) ×1 IMPLANT
TOWEL GREEN STERILE (TOWEL DISPOSABLE) ×1 IMPLANT
TOWEL GREEN STERILE FF (TOWEL DISPOSABLE) ×1 IMPLANT
TRAY FOLEY MTR SLVR 16FR STAT (SET/KITS/TRAYS/PACK) ×1 IMPLANT
WATER STERILE IRR 1000ML POUR (IV SOLUTION) ×1 IMPLANT
YANKAUER SUCT BULB TIP NO VENT (SUCTIONS) ×1 IMPLANT

## 2022-08-27 NOTE — Op Note (Signed)
PATIENT NAME: Faith Gaines [facility]   MEDICAL RECORD NO.:   [id number]    DATE OF BIRTH: 01-08-1946   DATE OF PROCEDURE: 08/27/2022                               OPERATIVE REPORT     PREOPERATIVE DIAGNOSES: 1. Left-sided cervical radiculopathy. 2. Spinal stenosis spanning C4-C7.   POSTOPERATIVE DIAGNOSES: 1. Left-sided cervical radiculopathy. 2. Spinal stenosis spanning C4-C7.   PROCEDURE: 1. Anterior cervical decompression and fusion C4/5, C5/6, C6/7. 2. Placement of anterior instrumentation, C4-C7. 3. Insertion of interbody device x 3 (Titan intervertebral spacers). 4. Intraoperative use of fluoroscopy. 5. Use of morselized allograft - ViviGen.   SURGEON:  Phylliss Bob, MD   ASSISTANT:  Pricilla Holm, PA-C.   ANESTHESIA:  General endotracheal anesthesia.   COMPLICATIONS:  None.   DISPOSITION:  Stable.   ESTIMATED BLOOD LOSS:  Minimal.   INDICATIONS FOR SURGERY:  Briefly, Faith Gaines is a pleasant 77 y.o. year- old patient, who did present to me with severe pain in the neck and left arm.  The patient's MRI did reveal the findings noted above.  Given the patient's ongoing rather debilitating pain and lack of improvement with appropriate treatment measures, we did discuss proceeding with the procedure noted above.  The patient was fully aware of the risks and limitations of surgery as outlined in my preoperative note.   OPERATIVE DETAILS:  On 08/27/2022, the patient was brought to surgery and general endotracheal anesthesia was administered.  The patient was placed supine on the hospital bed. The neck was gently extended.  All bony prominences were meticulously padded.  The neck was prepped and draped in the usual sterile fashion.  At this point, I did make a left-sided transverse incision.  The platysma was incised.  A Smith-Robinson approach was used and the anterior spine was identified. A self-retaining retractor was placed.  I then subperiosteally  exposed the vertebral bodies from C4-C7.  Caspar pins were then placed into the C6 and C7 vertebral bodies and distraction was applied.  A thorough and complete C6-7 intervertebral diskectomy was performed.  The posterior longitudinal ligament was identified and entered using a nerve hook.  I then used #1 followed by #2 Kerrison to perform a thorough and complete intervertebral diskectomy.  The spinal canal was thoroughly decompressed, as was the right and left neuroforamen.  The endplates were then prepared and the appropriate-sized intervertebral spacer was then packed with ViviGen and tamped into position in the usual fashion.  The lower Caspar pin was then removed and placed into the C5 vertebral body and once again, distraction was applied across the C5-6 intervertebral space.  I then again performed a thorough and complete diskectomy, thoroughly decompressing the spinal canal and bilateral neuroforamena.  After preparing the endplates, the appropriate-sized intervertebral spacer was packed with ViviGen and tamped into position.  The lower Caspar pin was then removed and placed into the C4 vertebral body and once again, distraction was applied across the C4-5 intervertebral space.  I then again performed a thorough and complete diskectomy, thoroughly decompressing the spinal canal and bilateral neuroforamena.  After preparing the endplates, the appropriate-sized intervertebral spacer was packed with ViviGen and tamped into position.  The Caspar pins then were removed and bone wax was placed in their place.  The appropriate-sized anterior cervical plate was placed over the anterior spine.  14 mm variable angle screws were placed, 2  in each vertebral body from C4-C7 for a total of 8 vertebral body screws.  The screws were then locked to the plate using the Cam locking mechanism.  I was very pleased with the final fluoroscopic images.  The wound was then irrigated.  The wound was then  explored for any undue bleeding and there was no bleeding noted. The wound was then closed in layers using 2-0 Vicryl, followed by 4-0 Monocryl.  Benzoin and Steri-Strips were applied, followed by sterile dressing.  All instrument counts were correct at the termination of the procedure.   Of note, Pricilla Holm, PA-C, was my assistant throughout surgery, and did aid in retraction, suctioning, placement of the hardware, and closure.     Phylliss Bob, MD

## 2022-08-27 NOTE — Anesthesia Procedure Notes (Signed)
Procedure Name: Intubation Date/Time: 08/27/2022 11:11 AM  Performed by: Reeves Dam, CRNAPre-anesthesia Checklist: Patient identified, Patient being monitored, Timeout performed, Emergency Drugs available and Suction available Patient Re-evaluated:Patient Re-evaluated prior to induction Oxygen Delivery Method: Circle system utilized Preoxygenation: Pre-oxygenation with 100% oxygen Induction Type: IV induction Ventilation: Mask ventilation without difficulty Laryngoscope Size: 3 and Glidescope Grade View: Grade I Tube type: Oral Tube size: 7.5 mm Number of attempts: 1 Airway Equipment and Method: Stylet Placement Confirmation: ETT inserted through vocal cords under direct vision, positive ETCO2 and breath sounds checked- equal and bilateral Secured at: 21 cm Tube secured with: Tape Dental Injury: Teeth and Oropharynx as per pre-operative assessment  Comments: Neutral head and neck position maintained

## 2022-08-27 NOTE — Transfer of Care (Signed)
Immediate Anesthesia Transfer of Care Note  Patient: Willye Javier Catoe  Procedure(s) Performed: ANTERIOR CERVICAL DECOMPRESSION FUSION CERVICAL 4- CERVICAL 5, CERVICAL 5- CERVICAL 6, CERVICAL 6- CERVICAL 7 WITH INSTRUMENTATION AND ALLOGRAFT  Patient Location: PACU  Anesthesia Type:General  Level of Consciousness: awake and patient cooperative  Airway & Oxygen Therapy: Patient Spontanous Breathing and Patient connected to nasal cannula oxygen  Post-op Assessment: Report given to RN, Post -op Vital signs reviewed and stable, and Patient moving all extremities X 4  Post vital signs: Reviewed and stable  Last Vitals:  Vitals Value Taken Time  BP 146/71 08/27/22 1416  Temp    Pulse 55 08/27/22 1422  Resp 11 08/27/22 1422  SpO2 100 % 08/27/22 1422  Vitals shown include unvalidated device data.  Last Pain:  Vitals:   08/27/22 0807  PainSc: 8       Patients Stated Pain Goal: 5 (80/04/47 1580)  Complications: No notable events documented.

## 2022-08-27 NOTE — H&P (Signed)
PREOPERATIVE H&P  Chief Complaint: Left arm pain and weakness  HPI: Faith Gaines is a 77 y.o. female who presents with ongoing pain and weakness in the left arm  MRI reveals spinal stenosis spanning C4-C7  Patient has failed multiple forms of conservative care and continues to have pain (see office notes for additional details regarding the patient's full course of treatment)  Past Medical History:  Diagnosis Date   Anxiety    Bicipital tendonitis of left shoulder    Bilateral carotid artery stenosis without cerebral infarction    vascular--- dr Kellie Simmering---- s/p right CEA 12/ 2014;  last duplex in epic 07-15-2020  post right CEA with 1-39% stenosis and left ICA 1-39%   CKD (chronic kidney disease), stage III ([place])    followed by pcp   Coronary artery disease cardiologist--- dr Warren Lacy   02-01-2020 CABG x3   Depression    DOE (dyspnea on exertion)    per pt still healing from CABG 07/ 2021   GERD (gastroesophageal reflux disease)    History of left breast cancer oncologist--- dr c. Hinton Rao ([facility])   dx 2015;   08-22-2013 s/p left partial masectomy, Stage 0, low grade DCIS,  no chemo/ radiation   History of malignant melanoma 1982   excision left leg, per pt localized   History of skin cancer    per pt multiple excision's of BCC, SCC, and non-malignant melanoma   Hyperlipidemia    Hypertension    Iron deficiency anemia    hemotology/ oncology---- dr Hinton Rao----   has had iron infusions   OA (osteoarthritis)    Osteopenia    Peripheral vascular disease ([place])    vascular--- dr Kellie Simmering   Pneumonia    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   LIMA -- LAD,  SVG -- D1,  SVG -- OM1   Type 2 diabetes mellitus ([place])    followed by pcp--- (11-21-2020  per pt check blood sugar daily in am,  fasting sugar-- 80--150)   Past Surgical History:  Procedure Laterality Date   ANTERIOR AND POSTERIOR REPAIR N/A 10/22/2014   Procedure: CYSTO, REPAIR  CYSTOCELE/RECTOCELE;  Surgeon: Bjorn Loser, MD;  Location: [facility];  Service: Urology;  Laterality: N/A;   APPENDECTOMY     BLADDER SUSPENSION  05/1981   ABDOMINAL MARSHALL-MARCHETTI- KRANTZ PROCEDURE, per pt   COLONOSCOPY     CORONARY ARTERY BYPASS GRAFT N/A 02/01/2020   Procedure: CORONARY ARTERY BYPASS GRAFTING (CABG) using LIMA to LAD; Endovein Harvested Greater Saphenous Vein: SVG to Diag1; SVG to OM1.;  Surgeon: Lajuana Matte, MD;  Location: [facility];  Service: Open Heart Surgery;  Laterality: N/A;   ENDARTERECTOMY Right 06/29/2013   Procedure: ENDARTERECTOMY CAROTID-RIGHT, WITH DACRON PATCH ANGIOPLASTY;  Surgeon: Mal Misty, MD;  Location: [facility];  Service: Vascular;  Laterality: Right;   ENDOVEIN HARVEST OF GREATER SAPHENOUS VEIN Left 02/01/2020   Procedure: ENDOVEIN HARVEST OF GREATER SAPHENOUS VEIN;  Surgeon: Lajuana Matte, MD;  Location: [place];  Service: Open Heart Surgery;  Laterality: Left;   LAPAROSCOPIC ASSISTED VAGINAL HYSTERECTOMY N/A 10/22/2014   Procedure: LAPAROSCOPIC ASSISTED VAGINAL HYSTERECTOMY;  Surgeon: Marylynn Pearson, MD;  Location: [facility];  Service: Gynecology;  Laterality: N/A;  Pam from Dr. Mikle Bosworth office will call and add additional surgery for this patient in our block time.   LAPAROSCOPIC CHOLECYSTECTOMY  1985   W/  APPENDECTOMY   LEFT HEART CATH AND CORONARY ANGIOGRAPHY N/A 01/26/2020  Procedure: LEFT HEART CATH AND CORONARY ANGIOGRAPHY;  Surgeon: [name], MD;  Location: [facility];  Service: Cardiovascular;  Laterality: N/A;   MELANOMA EXCISION  12/1980   left leg,   PARTIAL MASTECTOMY WITH NEEDLE LOCALIZATION Left 08/22/2013   Procedure: PARTIAL MASTECTOMY WITH NEEDLE LOCALIZATION;  Surgeon: Stark Klein, MD;  Location: [place];  Service: General;  Laterality: Left;   SALPINGOOPHORECTOMY Bilateral 10/22/2014   Procedure: SALPINGO OOPHORECTOMY;  Surgeon: Marylynn Pearson, MD;  Location: [facility];  Service: Gynecology;   Laterality: Bilateral;   SHOULDER ARTHROSCOPY Left 11/26/2020   Procedure: ARTHROSCOPY SHOULDER BICEPS TENODESIS;  Surgeon: Renette Butters, MD;  Location: [facility];  Service: Orthopedics;  Laterality: Left;   TEE WITHOUT CARDIOVERSION N/A 02/01/2020   Procedure: TRANSESOPHAGEAL ECHOCARDIOGRAM (TEE);  Surgeon: Lajuana Matte, MD;  Location: [facility];  Service: Open Heart Surgery;  Laterality: N/A;   TUBAL LIGATION     VAGINAL PROLAPSE REPAIR N/A 10/22/2014   Procedure: VAULT PROLAPSE WITH GRAFT;  Surgeon: Bjorn Loser, MD;  Location: [facility];  Service: Urology;  Laterality: N/A;   WISDOM TOOTH EXTRACTION     Social History   Socioeconomic History   Marital status: Widowed    Spouse name: Not on file   Number of children: Not on file   Years of education: Not on file   Highest education level: Not on file  Occupational History    Employer: [employer]     Comment: [employer]  Tobacco Use   Smoking status: Former    Packs/day: 0.25    Years: 3.00    Total pack years: 0.75    Types: Cigarettes    Quit date: 07/20/2010    Years since quitting: 12.1   Smokeless tobacco: Never  Vaping Use   Vaping Use: Never used  Substance and Sexual Activity   Alcohol use: No    Alcohol/week: 0.0 standard drinks of alcohol   Drug use: Never   Sexual activity: Yes    Birth control/protection: Post-menopausal, Surgical  Other Topics Concern   Not on file  Social History Narrative   Widow.  Lives alone.  Three children and 9 grands.     Social Determinants of Health   Financial Resource Strain: Not on file  Food Insecurity: Not on file  Transportation Needs: Not on file  Physical Activity: Not on file  Stress: Not on file  Social Connections: Not on file   Family History  Problem Relation Age of Onset   Cancer Mother        breast, uterus, lung   Heart attack Mother 44   Stroke Mother    Heart disease Mother        Open Heart Surgery    Hypertension Mother    Varicose Veins Mother    Heart disease Father    Heart attack Father 16       Died of MI age 70   Kidney disease Father    Hypertension Father    Diabetes Father    Cancer Father    Asthma Father    Heart attack Sister 88       ICD   Heart disease Sister    Healthy Daughter    Healthy Daughter    Healthy Son    Allergies  Allergen Reactions   Codeine Nausea And Vomiting and Other (See Comments)    And Headache   Other reaction(s): Unknown   Ace Inhibitors  Drop in GFR Other reaction(s): drop in GFR Other reaction(s): drop in GFR   Atorvastatin     Body aches Other reaction(s): body aches Other reaction(s): body aches   Clarithromycin     Unable to sleep  Other reaction(s): unable to sleep Other reaction(s): unable to sleep   Hydroxychloroquine Sulfate Other (See Comments)    Per pt is has been so long ago, unsure reaction Other reaction(s): Itching Other reaction(s): Itching   Iodine     Other reaction(s): fungus? Other reaction(s): fungus? Other reaction(s): fungus?   Rosuvastatin     Body aches Other reaction(s): body aches   Avandamet [Rosiglitazone-Metformin] Rash and Other (See Comments)    Makes her hyper   Betadine [Povidone Iodine] Rash   Other Rash    Other reaction(s): body aches Other reaction(s): rash Other reaction(s): rash   Prior to Admission medications   Medication Sig Start Date End Date Taking? Authorizing Provider  aspirin 81 MG tablet Take 1 tablet (81 mg total) by mouth daily. 11/11/20  Yes Minus Breeding, MD  Biotin 5000 MCG CAPS Take 5,000 mcg by mouth in the morning and at bedtime.   Yes [provider]  canagliflozin (INVOKANA) 300 MG TABS tablet Take 300 mg by mouth daily.   Yes [provider]  Cholecalciferol (VITAMIN D3) 50 MCG (2000 UT) TABS Take 50 mcg by mouth daily.   Yes [provider]  Evolocumab (REPATHA SURECLICK) 132 MG/ML SOAJ Inject 140 mg into the skin every 14  (fourteen) days. 11/14/21  Yes Minus Breeding, MD  famotidine (PEPCID) 40 MG tablet Take 40 mg by mouth at bedtime.   Yes [provider]  gabapentin (NEURONTIN) 300 MG capsule Take 1 capsule (300 mg total) by mouth 3 (three) times daily. Patient taking differently: Take 600 mg by mouth 2 (two) times daily. 10/03/21  Yes Derwood Kaplan, MD  metFORMIN (GLUCOPHAGE-XR) 500 MG 24 hr tablet Take 500 mg by mouth at bedtime. 09/15/21  Yes [provider]  Multiple Vitamins-Minerals (PRESERVISION AREDS 2 PO) Take 1 tablet by mouth daily.   Yes [provider]  nebivolol (BYSTOLIC) 5 MG tablet Take 5 mg by mouth daily. Takes with '20mg'$  to total '25mg'$    Yes [provider]  Nebivolol HCl (BYSTOLIC) 20 MG TABS Take 1 tablet by mouth daily. Takes with 5 mg to total 25 mg   Yes [provider]  Omega-3 Fatty Acids (FISH OIL) 1000 MG CAPS Take 2,000 mg by mouth in the morning and at bedtime.   Yes [provider]  pantoprazole (PROTONIX) 40 MG tablet Take 40 mg by mouth daily.   Yes [provider]  vitamin B-12 (CYANOCOBALAMIN) 500 MCG tablet Take 500 mcg by mouth daily.   Yes [provider]  Blood Glucose Monitoring Suppl ([place]) w/Device KIT See admin instructions. 11/17/16   [provider]  Semaglutide,0.25 or 0.'5MG'$ /DOS, (OZEMPIC, 0.25 OR 0.5 MG/DOSE,) 2 MG/1.5ML SOPN 0.25 MG Patient not taking: Reported on 08/18/2022 09/10/21   [provider]     All other systems have been reviewed and were otherwise negative with the exception of those mentioned in the HPI and as above.  Physical Exam: There were no vitals filed for this visit.  There is no height or weight on file to calculate BMI.  General: Alert, no acute distress Cardiovascular: No pedal edema Respiratory: No cyanosis, no use of accessory musculature Skin: No lesions in the area of chief complaint Neurologic: Sensation  intact  distally Psychiatric: Patient is competent for consent with normal mood and affect Lymphatic: No axillary or cervical lymphadenopathy   Assessment/Plan: Ongoing left-sided cervical radiculopathy, manifesting as ongoing left arm pain and weakness, secondary to neuroforaminal narrowing on the left at C4-C5, C5-C6 and C6-C7. Plan for Procedure(s): ANTERIOR CERVICAL DECOMPRESSION FUSION CERVICAL 4- CERVICAL 5, CERVICAL 5- CERVICAL 6, CERVICAL 6- CERVICAL 7 WITH INSTRUMENTATION AND ALLOGRAFT   Norva Karvonen, MD 08/27/2022 6:44 AM

## 2022-08-28 NOTE — Anesthesia Postprocedure Evaluation (Signed)
Anesthesia Post Note  Patient: Faith Gaines  Procedure(s) Performed: ANTERIOR CERVICAL DECOMPRESSION FUSION CERVICAL 4- CERVICAL 5, CERVICAL 5- CERVICAL 6, CERVICAL 6- CERVICAL 7 WITH INSTRUMENTATION AND ALLOGRAFT     Patient location during evaluation: PACU Anesthesia Type: General Level of consciousness: awake and alert Pain management: pain level controlled Vital Signs Assessment: post-procedure vital signs reviewed and stable Respiratory status: spontaneous breathing, nonlabored ventilation, respiratory function stable and patient connected to nasal cannula oxygen Cardiovascular status: blood pressure returned to baseline and stable Postop Assessment: no apparent nausea or vomiting Anesthetic complications: no   No notable events documented.  Last Vitals:  Vitals:   08/27/22 1545 08/27/22 1623  BP: (!) 148/66 (!) 163/71  Pulse: (!) 59 (!) 58  Resp: 13   Temp:  (!) 36.2 C  SpO2: 97% 96%    Last Pain:  Vitals:   08/27/22 1515  PainSc: Asleep                 Tiajuana Amass

## 2022-09-02 DIAGNOSIS — G8918 Other acute postprocedural pain: Secondary | ICD-10-CM | POA: Diagnosis not present

## 2022-09-02 DIAGNOSIS — Z043 Encounter for examination and observation following other accident: Secondary | ICD-10-CM | POA: Diagnosis not present

## 2022-09-02 DIAGNOSIS — R9431 Abnormal electrocardiogram [ECG] [EKG]: Secondary | ICD-10-CM | POA: Diagnosis not present

## 2022-09-02 DIAGNOSIS — I252 Old myocardial infarction: Secondary | ICD-10-CM | POA: Diagnosis not present

## 2022-09-02 DIAGNOSIS — I672 Cerebral atherosclerosis: Secondary | ICD-10-CM | POA: Diagnosis not present

## 2022-09-02 DIAGNOSIS — R112 Nausea with vomiting, unspecified: Secondary | ICD-10-CM | POA: Diagnosis not present

## 2022-09-02 DIAGNOSIS — I444 Left anterior fascicular block: Secondary | ICD-10-CM | POA: Diagnosis not present

## 2022-09-02 DIAGNOSIS — E872 Acidosis, unspecified: Secondary | ICD-10-CM | POA: Diagnosis not present

## 2022-09-02 DIAGNOSIS — S060XAA Concussion with loss of consciousness status unknown, initial encounter: Secondary | ICD-10-CM | POA: Diagnosis not present

## 2022-09-04 ENCOUNTER — Inpatient Hospital Stay (HOSPITAL_COMMUNITY)
Admission: EM | Admit: 2022-09-04 | Discharge: 2022-09-07 | DRG: 638 | Disposition: A | Discharge status: Home Health | Payer: Medicare Other | Attending: Internal Medicine | Admitting: Internal Medicine

## 2022-09-04 ENCOUNTER — Encounter (HOSPITAL_COMMUNITY): Payer: Self-pay | Admitting: Orthopedic Surgery

## 2022-09-04 ENCOUNTER — Emergency Department (HOSPITAL_COMMUNITY): Payer: Medicare Other

## 2022-09-04 ENCOUNTER — Other Ambulatory Visit: Payer: Self-pay

## 2022-09-04 DIAGNOSIS — E876 Hypokalemia: Secondary | ICD-10-CM | POA: Diagnosis not present

## 2022-09-04 DIAGNOSIS — Z85828 Personal history of other malignant neoplasm of skin: Secondary | ICD-10-CM

## 2022-09-04 DIAGNOSIS — C50412 Malignant neoplasm of upper-outer quadrant of left female breast: Secondary | ICD-10-CM | POA: Diagnosis present

## 2022-09-04 DIAGNOSIS — N179 Acute kidney failure, unspecified: Secondary | ICD-10-CM | POA: Insufficient documentation

## 2022-09-04 DIAGNOSIS — E785 Hyperlipidemia, unspecified: Secondary | ICD-10-CM | POA: Diagnosis present

## 2022-09-04 DIAGNOSIS — E1151 Type 2 diabetes mellitus with diabetic peripheral angiopathy without gangrene: Secondary | ICD-10-CM | POA: Diagnosis not present

## 2022-09-04 DIAGNOSIS — Z853 Personal history of malignant neoplasm of breast: Secondary | ICD-10-CM | POA: Diagnosis not present

## 2022-09-04 DIAGNOSIS — Z1152 Encounter for screening for COVID-19: Secondary | ICD-10-CM

## 2022-09-04 DIAGNOSIS — I779 Disorder of arteries and arterioles, unspecified: Secondary | ICD-10-CM | POA: Diagnosis present

## 2022-09-04 DIAGNOSIS — F419 Anxiety disorder, unspecified: Secondary | ICD-10-CM | POA: Diagnosis present

## 2022-09-04 DIAGNOSIS — E1110 Type 2 diabetes mellitus with ketoacidosis without coma: Secondary | ICD-10-CM | POA: Diagnosis not present

## 2022-09-04 DIAGNOSIS — I129 Hypertensive chronic kidney disease with stage 1 through stage 4 chronic kidney disease, or unspecified chronic kidney disease: Secondary | ICD-10-CM | POA: Diagnosis present

## 2022-09-04 DIAGNOSIS — Z91041 Radiographic dye allergy status: Secondary | ICD-10-CM | POA: Diagnosis not present

## 2022-09-04 DIAGNOSIS — I2510 Atherosclerotic heart disease of native coronary artery without angina pectoris: Secondary | ICD-10-CM | POA: Diagnosis present

## 2022-09-04 DIAGNOSIS — Z85820 Personal history of malignant melanoma of skin: Secondary | ICD-10-CM

## 2022-09-04 DIAGNOSIS — Z7984 Long term (current) use of oral hypoglycemic drugs: Secondary | ICD-10-CM

## 2022-09-04 DIAGNOSIS — D72829 Elevated white blood cell count, unspecified: Secondary | ICD-10-CM | POA: Insufficient documentation

## 2022-09-04 DIAGNOSIS — J479 Bronchiectasis, uncomplicated: Secondary | ICD-10-CM | POA: Diagnosis not present

## 2022-09-04 DIAGNOSIS — N1832 Chronic kidney disease, stage 3b: Secondary | ICD-10-CM | POA: Diagnosis not present

## 2022-09-04 DIAGNOSIS — Z885 Allergy status to narcotic agent status: Secondary | ICD-10-CM

## 2022-09-04 DIAGNOSIS — Z833 Family history of diabetes mellitus: Secondary | ICD-10-CM

## 2022-09-04 DIAGNOSIS — J9811 Atelectasis: Secondary | ICD-10-CM | POA: Diagnosis not present

## 2022-09-04 DIAGNOSIS — Z888 Allergy status to other drugs, medicaments and biological substances status: Secondary | ICD-10-CM | POA: Diagnosis not present

## 2022-09-04 DIAGNOSIS — Z951 Presence of aortocoronary bypass graft: Secondary | ICD-10-CM | POA: Diagnosis not present

## 2022-09-04 DIAGNOSIS — Z823 Family history of stroke: Secondary | ICD-10-CM

## 2022-09-04 DIAGNOSIS — K219 Gastro-esophageal reflux disease without esophagitis: Secondary | ICD-10-CM | POA: Diagnosis not present

## 2022-09-04 DIAGNOSIS — I10 Essential (primary) hypertension: Secondary | ICD-10-CM | POA: Diagnosis not present

## 2022-09-04 DIAGNOSIS — F32A Depression, unspecified: Secondary | ICD-10-CM | POA: Diagnosis present

## 2022-09-04 DIAGNOSIS — I739 Peripheral vascular disease, unspecified: Secondary | ICD-10-CM | POA: Diagnosis not present

## 2022-09-04 DIAGNOSIS — Z87891 Personal history of nicotine dependence: Secondary | ICD-10-CM | POA: Diagnosis not present

## 2022-09-04 DIAGNOSIS — Z79899 Other long term (current) drug therapy: Secondary | ICD-10-CM

## 2022-09-04 DIAGNOSIS — R110 Nausea: Secondary | ICD-10-CM | POA: Diagnosis not present

## 2022-09-04 DIAGNOSIS — E782 Mixed hyperlipidemia: Secondary | ICD-10-CM | POA: Diagnosis not present

## 2022-09-04 DIAGNOSIS — E1122 Type 2 diabetes mellitus with diabetic chronic kidney disease: Secondary | ICD-10-CM | POA: Diagnosis present

## 2022-09-04 DIAGNOSIS — R531 Weakness: Secondary | ICD-10-CM | POA: Diagnosis not present

## 2022-09-04 DIAGNOSIS — Z8249 Family history of ischemic heart disease and other diseases of the circulatory system: Secondary | ICD-10-CM | POA: Diagnosis not present

## 2022-09-04 DIAGNOSIS — Z825 Family history of asthma and other chronic lower respiratory diseases: Secondary | ICD-10-CM

## 2022-09-04 DIAGNOSIS — Z809 Family history of malignant neoplasm, unspecified: Secondary | ICD-10-CM

## 2022-09-04 DIAGNOSIS — Z841 Family history of disorders of kidney and ureter: Secondary | ICD-10-CM

## 2022-09-04 LAB — I-STAT VENOUS BLOOD GAS, ED
Acid-base deficit: 20 mmol/L — ABNORMAL HIGH (ref 0.0–2.0)
Bicarbonate: 7.8 mmol/L — ABNORMAL LOW (ref 20.0–28.0)
Calcium, Ion: 1.48 mmol/L — ABNORMAL HIGH (ref 1.15–1.40)
HCT: 45 % (ref 36.0–46.0)
Hemoglobin: 15.3 g/dL — ABNORMAL HIGH (ref 12.0–15.0)
O2 Saturation: 25 %
Potassium: 5 mmol/L (ref 3.5–5.1)
Sodium: 144 mmol/L (ref 135–145)
TCO2: 8 mmol/L — ABNORMAL LOW (ref 22–32)
pCO2, Ven: 23.3 mmHg — ABNORMAL LOW (ref 44–60)
pH, Ven: 7.131 — CL (ref 7.25–7.43)
pO2, Ven: 22 mmHg — CL (ref 32–45)

## 2022-09-04 LAB — URINALYSIS, ROUTINE W REFLEX MICROSCOPIC
Bacteria, UA: NONE SEEN
Bilirubin Urine: NEGATIVE
Glucose, UA: >=500 mg/dL — AB
Hgb urine dipstick: NEGATIVE
Ketones, ur: 80 mg/dL — AB
Leukocytes,Ua: NEGATIVE
Nitrite: NEGATIVE
Protein, ur: NEGATIVE mg/dL
Specific Gravity, Urine: 1.018 (ref 1.005–1.030)
pH: 5 (ref 5.0–8.0)

## 2022-09-04 LAB — BASIC METABOLIC PANEL
Anion gap: 14 (ref 5–15)
Anion gap: 15 (ref 5–15)
Anion gap: 10 (ref 5–15)
BUN: 20 mg/dL (ref 8–23)
BUN: 17 mg/dL (ref 8–23)
BUN: 15 mg/dL (ref 8–23)
CO2: 10 mmol/L — ABNORMAL LOW (ref 22–32)
CO2: 13 mmol/L — ABNORMAL LOW (ref 22–32)
CO2: 15 mmol/L — ABNORMAL LOW (ref 22–32)
Calcium: 9.7 mg/dL (ref 8.9–10.3)
Calcium: 10.2 mg/dL (ref 8.9–10.3)
Calcium: 10.1 mg/dL (ref 8.9–10.3)
Chloride: 118 mmol/L — ABNORMAL HIGH (ref 98–111)
Chloride: 117 mmol/L — ABNORMAL HIGH (ref 98–111)
Chloride: 119 mmol/L — ABNORMAL HIGH (ref 98–111)
Creatinine, Ser: 1.33 mg/dL — ABNORMAL HIGH (ref 0.44–1.00)
Creatinine, Ser: 1.24 mg/dL — ABNORMAL HIGH (ref 0.44–1.00)
Creatinine, Ser: 1.06 mg/dL — ABNORMAL HIGH (ref 0.44–1.00)
GFR, Estimated: 42 mL/min — ABNORMAL LOW (ref >60)
GFR, Estimated: 46 mL/min — ABNORMAL LOW (ref >60)
GFR, Estimated: 55 mL/min — ABNORMAL LOW (ref >60)
Glucose, Bld: 198 mg/dL — ABNORMAL HIGH (ref 70–99)
Glucose, Bld: 191 mg/dL — ABNORMAL HIGH (ref 70–99)
Glucose, Bld: 185 mg/dL — ABNORMAL HIGH (ref 70–99)
Potassium: 4.1 mmol/L (ref 3.5–5.1)
Potassium: 4.2 mmol/L (ref 3.5–5.1)
Potassium: 3.8 mmol/L (ref 3.5–5.1)
Sodium: 142 mmol/L (ref 135–145)
Sodium: 145 mmol/L (ref 135–145)
Sodium: 144 mmol/L (ref 135–145)

## 2022-09-04 LAB — CBG MONITORING, ED
Glucose-Capillary: 229 mg/dL — ABNORMAL HIGH (ref 70–99)
Glucose-Capillary: 179 mg/dL — ABNORMAL HIGH (ref 70–99)
Glucose-Capillary: 197 mg/dL — ABNORMAL HIGH (ref 70–99)
Glucose-Capillary: 178 mg/dL — ABNORMAL HIGH (ref 70–99)

## 2022-09-04 LAB — GLUCOSE, CAPILLARY
Glucose-Capillary: 198 mg/dL — ABNORMAL HIGH (ref 70–99)
Glucose-Capillary: 169 mg/dL — ABNORMAL HIGH (ref 70–99)
Glucose-Capillary: 178 mg/dL — ABNORMAL HIGH (ref 70–99)
Glucose-Capillary: 168 mg/dL — ABNORMAL HIGH (ref 70–99)
Glucose-Capillary: 166 mg/dL — ABNORMAL HIGH (ref 70–99)

## 2022-09-04 LAB — COMPREHENSIVE METABOLIC PANEL
ALT: 13 U/L (ref 0–44)
AST: 23 U/L (ref 15–41)
Albumin: 3.7 g/dL (ref 3.5–5.0)
Alkaline Phosphatase: 93 U/L (ref 38–126)
Anion gap: 26 — ABNORMAL HIGH (ref 5–15)
BUN: 23 mg/dL (ref 8–23)
CO2: 8 mmol/L — ABNORMAL LOW (ref 22–32)
Calcium: 11 mg/dL — ABNORMAL HIGH (ref 8.9–10.3)
Chloride: 110 mmol/L (ref 98–111)
Creatinine, Ser: 1.68 mg/dL — ABNORMAL HIGH (ref 0.44–1.00)
GFR, Estimated: 32 mL/min — ABNORMAL LOW (ref >60)
Glucose, Bld: 275 mg/dL — ABNORMAL HIGH (ref 70–99)
Potassium: 5.1 mmol/L (ref 3.5–5.1)
Sodium: 144 mmol/L (ref 135–145)
Total Bilirubin: 2 mg/dL — ABNORMAL HIGH (ref 0.3–1.2)
Total Protein: 7.5 g/dL (ref 6.5–8.1)

## 2022-09-04 LAB — CBC
HCT: 52.7 % — ABNORMAL HIGH (ref 36.0–46.0)
Hemoglobin: 16.2 g/dL — ABNORMAL HIGH (ref 12.0–15.0)
MCH: 30.7 pg (ref 26.0–34.0)
MCHC: 30.7 g/dL (ref 30.0–36.0)
MCV: 100 fL (ref 80.0–100.0)
Platelets: 416 10*3/uL — ABNORMAL HIGH (ref 150–400)
RBC: 5.27 MIL/uL — ABNORMAL HIGH (ref 3.87–5.11)
RDW: 13.5 % (ref 11.5–15.5)
WBC: 14.5 10*3/uL — ABNORMAL HIGH (ref 4.0–10.5)
nRBC: 0 % (ref 0.0–0.2)

## 2022-09-04 LAB — RESP PANEL BY RT-PCR (RSV, FLU A&B, COVID)  RVPGX2
Influenza A by PCR: NEGATIVE
Influenza B by PCR: NEGATIVE
Resp Syncytial Virus by PCR: NEGATIVE
SARS Coronavirus 2 by RT PCR: NEGATIVE

## 2022-09-04 LAB — BETA-HYDROXYBUTYRIC ACID
Beta-Hydroxybutyric Acid: >8 mmol/L — ABNORMAL HIGH (ref 0.05–0.27)
Beta-Hydroxybutyric Acid: 2.57 mmol/L — ABNORMAL HIGH (ref 0.05–0.27)

## 2022-09-04 LAB — MAGNESIUM: Magnesium: 2.2 mg/dL (ref 1.7–2.4)

## 2022-09-04 LAB — LIPASE, BLOOD: Lipase: 52 U/L — ABNORMAL HIGH (ref 11–51)

## 2022-09-04 MED ORDER — SODIUM CHLORIDE 0.9 % IV BOLUS
500.0000 mL | Freq: Once | INTRAVENOUS | Status: AC
  Administered 2022-09-04: 500 mL via INTRAVENOUS

## 2022-09-04 MED ORDER — PANTOPRAZOLE SODIUM 40 MG PO TBEC
40.0000 mg | DELAYED_RELEASE_TABLET | Freq: Every day | ORAL | Status: DC
  Administered 2022-09-05 – 2022-09-07 (×3): 40 mg via ORAL
  Filled 2022-09-04 (×3): qty 1

## 2022-09-04 MED ORDER — ONDANSETRON HCL 4 MG/2ML IJ SOLN
4.0000 mg | Freq: Once | INTRAMUSCULAR | Status: AC | PRN
  Administered 2022-09-04: 4 mg via INTRAVENOUS
  Filled 2022-09-04: qty 2

## 2022-09-04 MED ORDER — LACTATED RINGERS IV SOLN: INTRAVENOUS | Status: DC

## 2022-09-04 MED ORDER — DEXTROSE IN LACTATED RINGERS 5 % IV SOLN: INTRAVENOUS | Status: DC

## 2022-09-04 MED ORDER — ONDANSETRON HCL 4 MG/2ML IJ SOLN
4.0000 mg | Freq: Four times a day (QID) | INTRAMUSCULAR | Status: DC | PRN
  Administered 2022-09-04: 4 mg via INTRAVENOUS
  Filled 2022-09-04: qty 2

## 2022-09-04 MED ORDER — ENOXAPARIN SODIUM 30 MG/0.3ML IJ SOSY
30.0000 mg | PREFILLED_SYRINGE | INTRAMUSCULAR | Status: DC
  Administered 2022-09-04 – 2022-09-06 (×3): 30 mg via SUBCUTANEOUS
  Filled 2022-09-04 (×3): qty 0.3

## 2022-09-04 MED ORDER — ASPIRIN 81 MG PO TBEC
81.0000 mg | DELAYED_RELEASE_TABLET | Freq: Every day | ORAL | Status: DC
  Administered 2022-09-05 – 2022-09-07 (×3): 81 mg via ORAL
  Filled 2022-09-04 (×3): qty 1

## 2022-09-04 MED ORDER — INSULIN REGULAR(HUMAN) IN NACL 100-0.9 UT/100ML-% IV SOLN
INTRAVENOUS | Status: DC
  Administered 2022-09-04: 3.6 [IU]/h via INTRAVENOUS
  Filled 2022-09-04: qty 100

## 2022-09-04 MED ORDER — GABAPENTIN 300 MG PO CAPS
600.0000 mg | ORAL_CAPSULE | Freq: Two times a day (BID) | ORAL | Status: DC
  Administered 2022-09-04 – 2022-09-07 (×5): 600 mg via ORAL
  Filled 2022-09-04 (×6): qty 2

## 2022-09-04 MED ORDER — DEXTROSE 50 % IV SOLN: 0.0000 mL | INTRAVENOUS | Status: DC | PRN

## 2022-09-04 MED ORDER — LACTATED RINGERS IV BOLUS
20.0000 mL/kg | Freq: Once | INTRAVENOUS | Status: AC
  Administered 2022-09-04: 962 mL via INTRAVENOUS

## 2022-09-04 MED ORDER — FAMOTIDINE 20 MG PO TABS
40.0000 mg | ORAL_TABLET | Freq: Every day | ORAL | Status: DC
  Administered 2022-09-04 – 2022-09-06 (×3): 40 mg via ORAL
  Filled 2022-09-04 (×3): qty 2

## 2022-09-04 NOTE — ED Notes (Signed)
Suella Broad PA shown results of Istat VBG results. ED-Lab.

## 2022-09-04 NOTE — H&P (Signed)
History and Physical   [name] [id number] DOB: 12-28-1945 DOA: 09/04/2022  PCP: [name], MD   Patient coming from: Home  Chief Complaint: Nausea, Vomiting, diarrhea, abdominal pain, body aches  HPI: Faith Gaines is a 77 y.o. female with medical history significant of CAD status post CABG, postthoracotomy pain syndrome, PVD, carotid artery disease, hypertension, hyperlipidemia, anemia, breast cancer status postlumpectomy, CKD 3, GERD, diabetes presenting with nausea vomiting diarrhea abdominal pain and generalized bodyaches.  Patient states that her symptoms have been ongoing for about 4-5 days. Of note, had a anterior cervical decompression and fusion on the eighth of this month and restarted medications the same day nausea began. She has had significant nausea vomiting diarrhea with associated abdominal pain that she feels is related to the recurrent nausea vomiting.  She also has had associated generalized body aches.  She states she was seen at [facility] ED a couple days ago and was prescribed Phenergan but this has not helped.  Unclear what the workup was at that time but she has paperwork with her that states that she did have a metabolic acidosis at that time.  She felt somewhat better after this visit as she received IV fluids there.  She has been unable to tolerate p.o. including medications. She states she has somewhat of a lump sensation in her neck ever since surgery but pain improved after a couple of days.  She denies fever, chills, chest pain, shortness of breath, constipation.  ED Course: Vital signs in ED significant for blood pressure in the [id number] to [id number] systolic, respiratory rate in the 20s.  Lab workup included CMP with potassium 5.1, bicarb 8, gap 26, BUN 23, creatinine elevated to 1.68 from baseline of 1, glucose 275, calcium 11, T. bili 2.  CBC showed leukocytosis to 14.5, hemoglobin stable at 16.2, platelets 416.  Lipase somewhat elevated  at 52.  Respiratory panel for flu COVID RSV pending.  Urinalysis pending.  Chest x-ray showed nodular opacity at the right midlung recommending CT follow-up preferably with contrast for evaluation of possible nodule.  Otherwise mild atelectasis was noted.  Magnesium level normal.  Patient started on insulin drip in the ED received 2 L of IV fluids and was started on continuous fluids.  Also received a dose of Zofran.  Review of Systems: As per HPI otherwise all other systems reviewed and are negative.  Past Medical History:  Diagnosis Date   Anxiety    Bicipital tendonitis of left shoulder    Bilateral carotid artery stenosis without cerebral infarction    vascular--- dr Kellie Simmering---- s/p right CEA 12/ 2014;  last duplex in epic 07-15-2020  post right CEA with 1-39% stenosis and left ICA 1-39%   CKD (chronic kidney disease), stage III ([place])    followed by pcp   Coronary artery disease cardiologist--- dr Warren Lacy   02-01-2020 CABG x3   Depression    DOE (dyspnea on exertion)    per pt still healing from CABG 07/ 2021   GERD (gastroesophageal reflux disease)    History of left breast cancer oncologist--- dr c. Hinton Rao ([facility])   dx 2015;   08-22-2013 s/p left partial masectomy, Stage 0, low grade DCIS,  no chemo/ radiation   History of malignant melanoma 1982   excision left leg, per pt localized   History of skin cancer    per pt multiple excision's of BCC, SCC, and non-malignant melanoma   Hyperlipidemia  Hypertension    Iron deficiency anemia    hemotology/ oncology---- dr Hinton Rao----   has had iron infusions   OA (osteoarthritis)    Osteopenia    Peripheral vascular disease ([place])    vascular--- dr Kellie Simmering   Pneumonia    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   LIMA -- LAD,  SVG -- D1,  SVG -- OM1   Type 2 diabetes mellitus ([place])    followed by pcp--- (11-21-2020  per pt check blood sugar daily in am,  fasting sugar-- 80--150)    Past Surgical History:  Procedure  Laterality Date   ANTERIOR AND POSTERIOR REPAIR N/A 10/22/2014   Procedure: CYSTO, REPAIR CYSTOCELE/RECTOCELE;  Surgeon: Bjorn Loser, MD;  Location: [facility];  Service: Urology;  Laterality: N/A;   APPENDECTOMY     BLADDER SUSPENSION  05/1981   ABDOMINAL MARSHALL-MARCHETTI- KRANTZ PROCEDURE, per pt   COLONOSCOPY     CORONARY ARTERY BYPASS GRAFT N/A 02/01/2020   Procedure: CORONARY ARTERY BYPASS GRAFTING (CABG) using LIMA to LAD; Endovein Harvested Greater Saphenous Vein: SVG to Diag1; SVG to OM1.;  Surgeon: Lajuana Matte, MD;  Location: [place];  Service: Open Heart Surgery;  Laterality: N/A;   ENDARTERECTOMY Right 06/29/2013   Procedure: ENDARTERECTOMY CAROTID-RIGHT, WITH DACRON PATCH ANGIOPLASTY;  Surgeon: Mal Misty, MD;  Location: [facility];  Service: Vascular;  Laterality: Right;   ENDOVEIN HARVEST OF GREATER SAPHENOUS VEIN Left 02/01/2020   Procedure: ENDOVEIN HARVEST OF GREATER SAPHENOUS VEIN;  Surgeon: Lajuana Matte, MD;  Location: [place];  Service: Open Heart Surgery;  Laterality: Left;   LAPAROSCOPIC ASSISTED VAGINAL HYSTERECTOMY N/A 10/22/2014   Procedure: LAPAROSCOPIC ASSISTED VAGINAL HYSTERECTOMY;  Surgeon: Marylynn Pearson, MD;  Location: [facility];  Service: Gynecology;  Laterality: N/A;  Pam from Dr. Mikle Bosworth office will call and add additional surgery for this patient in our block time.   LAPAROSCOPIC CHOLECYSTECTOMY  1985   W/  APPENDECTOMY   LEFT HEART CATH AND CORONARY ANGIOGRAPHY N/A 01/26/2020   Procedure: LEFT HEART CATH AND CORONARY ANGIOGRAPHY;  Surgeon: [name], MD;  Location: [facility];  Service: Cardiovascular;  Laterality: N/A;   MELANOMA EXCISION  12/1980   left leg,   PARTIAL MASTECTOMY WITH NEEDLE LOCALIZATION Left 08/22/2013   Procedure: PARTIAL MASTECTOMY WITH NEEDLE LOCALIZATION;  Surgeon: Stark Klein, MD;  Location: [place];  Service: General;  Laterality: Left;   SALPINGOOPHORECTOMY Bilateral 10/22/2014   Procedure: SALPINGO  OOPHORECTOMY;  Surgeon: Marylynn Pearson, MD;  Location: [facility];  Service: Gynecology;  Laterality: Bilateral;   SHOULDER ARTHROSCOPY Left 11/26/2020   Procedure: ARTHROSCOPY SHOULDER BICEPS TENODESIS;  Surgeon: Renette Butters, MD;  Location: [facility];  Service: Orthopedics;  Laterality: Left;   TEE WITHOUT CARDIOVERSION N/A 02/01/2020   Procedure: TRANSESOPHAGEAL ECHOCARDIOGRAM (TEE);  Surgeon: Lajuana Matte, MD;  Location: [facility];  Service: Open Heart Surgery;  Laterality: N/A;   TUBAL LIGATION     VAGINAL PROLAPSE REPAIR N/A 10/22/2014   Procedure: VAULT PROLAPSE WITH GRAFT;  Surgeon: Bjorn Loser, MD;  Location: [facility];  Service: Urology;  Laterality: N/A;   WISDOM TOOTH EXTRACTION      Social History  reports that she quit smoking about 12 years ago. Her smoking use included cigarettes. She has a 0.75 pack-year smoking history. She has never used smokeless tobacco. She reports that she does not drink alcohol and does not use drugs.  Allergies  Allergen Reactions   Codeine Nausea And Vomiting and Other (See  Comments)    And Headache   Other reaction(s): Unknown   Ace Inhibitors     Drop in GFR Other reaction(s): drop in GFR Other reaction(s): drop in GFR   Atorvastatin     Body aches Other reaction(s): body aches Other reaction(s): body aches   Clarithromycin     Unable to sleep  Other reaction(s): unable to sleep Other reaction(s): unable to sleep   Hydroxychloroquine Sulfate Other (See Comments)    Per pt is has been so long ago, unsure reaction Other reaction(s): Itching Other reaction(s): Itching   Iodine     Other reaction(s): fungus? Other reaction(s): fungus? Other reaction(s): fungus?   Rosuvastatin     Body aches Other reaction(s): body aches   Avandamet [Rosiglitazone-Metformin] Rash and Other (See Comments)    Makes her hyper   Betadine [Povidone Iodine] Rash   Other Rash    Other reaction(s): body aches Other reaction(s):  rash Other reaction(s): rash    Family History  Problem Relation Age of Onset   Cancer Mother        breast, uterus, lung   Heart attack Mother 77   Stroke Mother    Heart disease Mother        Open Heart Surgery   Hypertension Mother    Varicose Veins Mother    Heart disease Father    Heart attack Father 42       Died of MI age 105   Kidney disease Father    Hypertension Father    Diabetes Father    Cancer Father    Asthma Father    Heart attack Sister 38       ICD   Heart disease Sister    Healthy Daughter    Healthy Daughter    Healthy Son   Reviewed on admission  Prior to Admission medications   Medication Sig Start Date End Date Taking? Authorizing Provider  Biotin 5000 MCG CAPS Take 5,000 mcg by mouth in the morning and at bedtime.    [provider]  canagliflozin (INVOKANA) 300 MG TABS tablet Take 300 mg by mouth daily.    [provider]  Cholecalciferol (VITAMIN D3) 50 MCG (2000 UT) TABS Take 50 mcg by mouth daily.    [provider]  Evolocumab (REPATHA SURECLICK) [id number] MG/ML SOAJ Inject 140 mg into the skin every 14 (fourteen) days. 11/14/21   Minus Breeding, MD  famotidine (PEPCID) 40 MG tablet Take 40 mg by mouth at bedtime.    [provider]  gabapentin (NEURONTIN) 300 MG capsule Take 1 capsule (300 mg total) by mouth 3 (three) times daily. Patient taking differently: Take 600 mg by mouth 2 (two) times daily. 10/03/21   Derwood Kaplan, MD  HYDROcodone-acetaminophen (NORCO/VICODIN) 5-325 MG tablet Take 1 tablet by mouth every 6 (six) hours as needed for moderate pain or severe pain. 08/27/22 08/27/23  McKenzie, Lennie Muckle, PA-C  metFORMIN (GLUCOPHAGE-XR) 500 MG 24 hr tablet Take 500 mg by mouth at bedtime. 09/15/21   [provider]  methocarbamol (ROBAXIN) 750 MG tablet Take 1 tablet (750 mg total) by mouth every 6 (six) hours as needed for muscle spasms. 08/27/22   McKenzie, Lennie Muckle, PA-C  Multiple Vitamins-Minerals  (PRESERVISION AREDS 2 PO) Take 1 tablet by mouth daily.    [provider]  nebivolol (BYSTOLIC) 5 MG tablet Take 5 mg by mouth daily. Takes with 42m to total 297m   [provider]  Nebivolol HCl (BYSTOLIC) 20  MG TABS Take 1 tablet by mouth daily. Takes with 5 mg to total 25 mg    [provider]  ondansetron (ZOFRAN) 4 MG tablet Take 1 tablet (4 mg total) by mouth every 8 (eight) hours as needed for nausea or vomiting. 08/27/22   McKenzie, Lennie Muckle, PA-C  pantoprazole (PROTONIX) 40 MG tablet Take 40 mg by mouth daily.    [provider]  vitamin B-12 (CYANOCOBALAMIN) 500 MCG tablet Take 500 mcg by mouth daily.    [provider]    Physical Exam: Vitals:   09/04/22 1145 09/04/22 1200 09/04/22 1215 09/04/22 1230  BP: (!) 154/68 (!) 143/95 137/67 (!) 171/88  Pulse: 87 92 87 95  Resp: (!) 23 (!) 24 (!) 24 (!) 21  Temp:      TempSrc:      SpO2: 93% 100% 100% 100%  Weight:      Height:        Physical Exam Constitutional:      General: She is not in acute distress.    Appearance: Normal appearance.     Comments: Ill appearing, elderly female with C-collar in place  HENT:     Head: Normocephalic and atraumatic.     Mouth/Throat:     Mouth: Mucous membranes are moist.     Pharynx: Oropharynx is clear.  Eyes:     Extraocular Movements: Extraocular movements intact.     Pupils: Pupils are equal, round, and reactive to light.  Cardiovascular:     Rate and Rhythm: Normal rate and regular rhythm.     Pulses: Normal pulses.     Heart sounds: Normal heart sounds.  Pulmonary:     Effort: Pulmonary effort is normal. No respiratory distress.     Breath sounds: Normal breath sounds.  Abdominal:     General: Bowel sounds are normal. There is no distension.     Palpations: Abdomen is soft.     Tenderness: There is no abdominal tenderness.  Musculoskeletal:        General: No swelling or deformity.  Skin:    General: Skin is warm and dry.   Neurological:     General: No focal deficit present.     Mental Status: Mental status is at baseline.    Labs on Admission: I have personally reviewed following labs and imaging studies  CBC: Recent Labs  Lab 09/04/22 0938 09/04/22 1134  WBC 14.5*  --   HGB 16.2* 15.3*  HCT 52.7* 45.0  MCV 100.0  --   PLT 416*  --     Basic Metabolic Panel: Recent Labs  Lab 09/04/22 0938 09/04/22 1134  NA 144 144  K 5.1 5.0  CL 110  --   CO2 8*  --   GLUCOSE 275*  --   BUN 23  --   CREATININE 1.68*  --   CALCIUM 11.0*  --   MG 2.2  --     GFR: Estimated Creatinine Clearance: 22.2 mL/min (A) (by C-G formula based on SCr of 1.68 mg/dL (H)).  Liver Function Tests: Recent Labs  Lab 09/04/22 0938  AST 23  ALT 13  ALKPHOS 93  BILITOT 2.0*  PROT 7.5  ALBUMIN 3.7    Urine analysis:    Component Value Date/Time   COLORURINE STRAW (A) 02/01/2020 0524   APPEARANCEUR CLEAR 02/01/2020 0524   LABSPEC 1.008 02/01/2020 0524   PHURINE 5.0 02/01/2020 0524   GLUCOSEU 50 (A) 02/01/2020 [place] NEGATIVE 02/01/2020 0524  [place] NEGATIVE 02/01/2020 [place] 02/01/2020 0524   PROTEINUR NEGATIVE 02/01/2020 0524   UROBILINOGEN 0.2 06/28/2013 1330   NITRITE NEGATIVE 02/01/2020 0524   LEUKOCYTESUR SMALL (A) 02/01/2020 0524    Radiological Exams on Admission: DG Chest Port 1 View  Result Date: 09/04/2022 CLINICAL DATA:  SHOB EXAM: PORTABLE CHEST 1 VIEW COMPARISON:  Chest x-ray September 02, 2022. FINDINGS: Mild subsegmental left basilar streaky opacities. Nodular opacity in the right midlung. Cardiomediastinal silhouette is within normal limits. CABG and median sternotomy. ACDF. Cholecystectomy clips. IMPRESSION: 1. Nodular opacity in the right midlung. Recommend chest CT (preferably contrast) to further evaluate and exclude pulmonary nodule. 2. Mild subsegmental left basilar atelectasis. Electronically Signed   By: Margaretha Sheffield M.D.   On: 09/04/2022 10:04     EKG: Independently reviewed.  Sinus rhythm at 94 bpm.  Mild baseline wander.  QTc okay at 457.  Assessment/Plan Principal Problem:   DKA (diabetic ketoacidosis) ([place]) Active Problems:   Breast cancer of upper-outer quadrant of left female breast, Tis, 3 oclock   Carotid artery disease (HCC)   Hyperlipidemia   Hypertension   Peripheral vascular disease ([facility])   GERD (gastroesophageal reflux disease)   DM (diabetes mellitus), type 2 with peripheral vascular complications ([facility])   S/P CABG x 3   Coronary artery disease involving native heart without angina pectoris   Leukocytosis   Acute renal failure superimposed on stage 3b chronic kidney disease (HCC)   DKA Diabetes > Patient presenting with ongoing nausea vomiting abdominal pain with some diarrhea and generalized bodyaches. > Known history of diabetes no prior history of DKA.  Did have surgical intervention with anterior cervical decompression and fusion on February 8.  States her vomiting began that same day and had persistent since but has become worse in the last 4 days.  Seen at [facility] ED and prescribed Phenergan but has not helped.  Paperwork for the visit describes a metabolic acidosis but not much else. > Unclear etiology, given her age and prescription for canagliflozin she is at higher risk for euglycemic DKA.  Unclear of timing relation to surgical intervention if this may have increased her risk or if she may have been having anesthesia adverse reaction initially and then developed euglycemic DKA which is why she did not receive further workup at [facility]? > Noted to have bicarb of 8, gap 26, AKI as below, leukocytosis for now presumed reactive as below.  Mildly elevated lipase also likely reactive.  VBG showed pH 7.131 and pCO2 was 23.3.  Beta hydroxybutyric acid and urinalysis are pending. > Started on insulin drip in the ED and given 2 L of IV fluids.  Also given dose of Zofran. - Monitor on progressive unit - Continue  insulin drip - potassium supplementation not needed at this time - LR at 125 mL/hr until CBG less than 250 - Switch to D5-LR when 1 CBG less than 250 - Nothing by mouth  - BMET every 4 hours - CBG Q1H - Once anion gap closed 2, start CM diet and if able to eat, administer Lantus vs SSI - Continue insulin drip for 1-2 more hours, then discontinue and start SSI-S  - DC fluids if eating, drinking, and off insulin drip  AKI on CKD 3 > Creatinine elevated to 1.68 from baseline of 1.  Current creatinine clearance calculated at 22.2. > Received 2 L in the ED as above. - Continue with IV fluids as above - Trend renal function and electrolytes  Leukocytosis > Presumed to be reactive in the setting of 5 to 7 days of nausea vomiting diarrhea and now DKA.  No reported fevers or chills.  No evidence of infection on chest x-ray, urinalysis pending. > No evidence of infection at the site of recent cervical surgical intervention. - Trend fever curve and WBC  CAD Status post CABG x 3 Postthoracotomy pain syndrome > CABG in 2021.  Has had some pain noted in chart review suspected to be postthoracotomy pain syndrome on gabapentin. - Continue home gabapentin - Continue home ASA - Holding home beta-blocker  PVD Carotid artery disease Hyperlipidemia - On Repatha outpatient  Hypertension - Holding home nebivolol  GERD - Continue home PPI and Pepcid  History of breast cancer status postlumpectomy - Noted  Abnormal chest x-ray > Nodular opacity noted on chest x-ray.  Recommendation for contrasted CT follow-up.  With AKI we will hold off but will likely need this at some point.  DVT prophylaxis: Lovenox Code Status:   Full, Discussed at bedside Family Communication:  Updated at bedside  Disposition Plan:   Patient is from:  Home  Anticipated DC to:  Home  Anticipated DC date:  1 to 3 days  Anticipated DC barriers: None  Consults called:  None Admission status:  Observation,  progressive  Severity of Illness: The appropriate patient status for this patient is OBSERVATION. Observation status is judged to be reasonable and necessary in order to provide the required intensity of service to ensure the patient's safety. The patient's presenting symptoms, physical exam findings, and initial radiographic and laboratory data in the context of their medical condition is felt to place them at decreased risk for further clinical deterioration. Furthermore, it is anticipated that the patient will be medically stable for discharge from the hospital within 2 midnights of admission.    Marcelyn Bruins MD [facility]  How to contact the [facility] Attending or Consulting provider [place] or covering provider during after hours [place], for this patient?   Check the care team in [facility] and look for a) attending/consulting [facility] provider listed and b) the [facility] team listed Log into [email or web address] and use [place]'s universal password to access. If you do not have the password, please contact the hospital operator. Locate the [facility] provider you are looking for under [facility] and page to a number that you can be directly reached. If you still have difficulty reaching the provider, please page the [facility] (Director on Call) for the Hospitalists listed on amion for assistance.  09/04/2022, 12:39 PM

## 2022-09-04 NOTE — ED Notes (Signed)
ED TO INPATIENT HANDOFF REPORT  ED Nurse Name and Phone #: Judson Roch [phone]  S Name/Age/Gender Faith Gaines 77 y.o. female Room/Bed: 035C/035C  Code Status   Code Status: Full Code  Home/SNF/Other Home Patient oriented to: self, place, time, and situation Is this baseline? Yes   Triage Complete: Triage complete  Chief Complaint DKA (diabetic ketoacidosis) ([place]) [E11.10]  Triage Note Pt from home for eval of nausea, vomiting, diarrhea, abdominal pain, and generalized body aches since Monday. Recent cervical fusion by Dr. Lynann Bologna on 2/8. Has prescription of phenergan but states has vomited after taking it and is unable to tolerate any PO intake.    Allergies Allergies  Allergen Reactions   Codeine Nausea And Vomiting and Other (See Comments)    Headaches    Ace Inhibitors Other (See Comments)    Drop in GFR    Atorvastatin     Body aches Other reaction(s): body aches Other reaction(s): body aches   Biaxin [Clarithromycin] Other (See Comments)    Insomnia    Crestor [Rosuvastatin] Other (See Comments)    Myalgias    Iodine Other (See Comments)    Fungus    Plaquenil [Hydroxychloroquine Sulfate] Itching   Avandamet [Rosiglitazone-Metformin] Rash and Other (See Comments)    Hyperactivity    Betadine [Povidone Iodine] Rash    Level of Care/Admitting Diagnosis ED Disposition     ED Disposition  Admit   Condition  --   [facility] [[id number]]  Level of Care: Progressive [102]  Admit to Progressive based on following criteria: GI, ENDOCRINE disease patients with GI bleeding, acute liver failure or pancreatitis, stable with diabetic ketoacidosis or thyrotoxicosis (hypothyroid) state.  May place patient in observation at [facility] or [place] if equivalent level of care is available:: No  Covid Evaluation: Asymptomatic - no recent exposure (last 10 days) testing not required  Diagnosis: DKA (diabetic  ketoacidosis) [facility]) [id number]  Admitting Physician: Marcelyn Bruins [id number]  Attending Physician: Marcelyn Bruins [id number]          B Medical/Surgery History Past Medical History:  Diagnosis Date   Anxiety    Bicipital tendonitis of left shoulder    Bilateral carotid artery stenosis without cerebral infarction    vascular--- dr Kellie Simmering---- s/p right CEA 12/ 2014;  last duplex in epic 07-15-2020  post right CEA with 1-39% stenosis and left ICA 1-39%   CKD (chronic kidney disease), stage III ([place])    followed by pcp   Coronary artery disease cardiologist--- dr Warren Lacy   02-01-2020 CABG x3   Depression    DOE (dyspnea on exertion)    per pt still healing from CABG 07/ 2021   GERD (gastroesophageal reflux disease)    History of left breast cancer oncologist--- dr c. 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  LIMA -- LAD,  SVG -- D1,  SVG -- OM1   Type 2 diabetes mellitus ([place])    followed by pcp--- (11-21-2020  per pt check blood sugar daily in am,  fasting sugar-- 80--150)   Past Surgical History:  Procedure Laterality Date   ANTERIOR AND POSTERIOR REPAIR N/A 10/22/2014   Procedure: CYSTO, REPAIR CYSTOCELE/RECTOCELE;  Surgeon: Bjorn Loser, MD;  Location: [facility];  Service: Urology;  Laterality: N/A;   APPENDECTOMY     BLADDER SUSPENSION  05/1981   ABDOMINAL MARSHALL-MARCHETTI- KRANTZ PROCEDURE, per pt   COLONOSCOPY     CORONARY  ARTERY BYPASS GRAFT N/A 02/01/2020   Procedure: CORONARY ARTERY BYPASS GRAFTING (CABG) using LIMA to LAD; Endovein Harvested Greater Saphenous Vein: SVG to Diag1; SVG to OM1.;  Surgeon: Lajuana Matte, MD;  Location: [place];  Service: Open Heart Surgery;  Laterality: N/A;   ENDARTERECTOMY Right 06/29/2013   Procedure: ENDARTERECTOMY CAROTID-RIGHT, WITH DACRON PATCH ANGIOPLASTY;  Surgeon: Mal Misty, MD;  Location: [facility];  Service: Vascular;  Laterality: Right;   ENDOVEIN HARVEST OF GREATER SAPHENOUS VEIN Left 02/01/2020   Procedure: ENDOVEIN HARVEST OF GREATER SAPHENOUS VEIN;  Surgeon: Lajuana Matte, MD;  Location: [place];  Service: Open Heart Surgery;  Laterality: Left;   LAPAROSCOPIC ASSISTED VAGINAL HYSTERECTOMY N/A 10/22/2014   Procedure: LAPAROSCOPIC ASSISTED VAGINAL HYSTERECTOMY;  Surgeon: Marylynn Pearson, MD;  Location: [facility];  Service: Gynecology;  Laterality: N/A;  Pam from Dr. Mikle Bosworth office will call and add additional surgery for this patient in our block time.   LAPAROSCOPIC CHOLECYSTECTOMY  1985   W/  APPENDECTOMY   LEFT HEART CATH AND CORONARY ANGIOGRAPHY N/A 01/26/2020   Procedure: LEFT HEART CATH AND CORONARY ANGIOGRAPHY;  Surgeon: [name], MD;  Location: [facility];  Service: Cardiovascular;  Laterality: N/A;   MELANOMA EXCISION  12/1980   left leg,   PARTIAL MASTECTOMY WITH NEEDLE LOCALIZATION Left 08/22/2013   Procedure: PARTIAL MASTECTOMY WITH NEEDLE LOCALIZATION;  Surgeon: Stark Klein, MD;  Location: [place];  Service: General;  Laterality: Left;   SALPINGOOPHORECTOMY Bilateral 10/22/2014   Procedure: SALPINGO OOPHORECTOMY;  Surgeon: Marylynn Pearson, MD;  Location: [facility];  Service: Gynecology;  Laterality: Bilateral;   SHOULDER ARTHROSCOPY Left 11/26/2020   Procedure: ARTHROSCOPY SHOULDER BICEPS TENODESIS;  Surgeon: Renette Butters, MD;  Location: [facility];  Service: Orthopedics;  Laterality: Left;   TEE WITHOUT  CARDIOVERSION N/A 02/01/2020   Procedure: TRANSESOPHAGEAL ECHOCARDIOGRAM (TEE);  Surgeon: Lajuana Matte, MD;  Location: [place];  Service: Open Heart Surgery;  Laterality: N/A;   TUBAL LIGATION     VAGINAL PROLAPSE REPAIR N/A 10/22/2014   Procedure: VAULT PROLAPSE WITH GRAFT;  Surgeon: Bjorn Loser, MD;  Location: [facility];  Service: Urology;  Laterality: N/A;   WISDOM TOOTH EXTRACTION       A IV Location/Drains/Wounds Patient Lines/Drains/Airways Status     Active Line/Drains/Airways     Name Placement date Placement time Site Days   Peripheral IV 09/04/22 22 G Anterior;Left Forearm 09/04/22  0932  Forearm  less than 1   Peripheral IV 09/04/22 20 G Posterior;Right Forearm 09/04/22  1132  Forearm  less than 1   Incision - 2 Ports Abdomen 1: Umbilicus 2: Lower;Mid [id number]  0750  -- 2874            Intake/Output Last 24 hours  Intake/Output Summary (Last 24 hours) at 09/04/2022 1401 Last data filed at 09/04/2022 1351 Gross per 24 hour  Intake 1962 ml  Output --  Net 1962 ml    Labs/Imaging Results for orders placed or performed during the hospital encounter of 09/04/22 (from the past 48 hour(s))  Lipase, blood     Status: Abnormal   Collection Time: 09/04/22  9:38 AM  Result Value Ref Range   Lipase 52 (H) 11 - 51 U/L    Comment: Performed at [facility] [address], [place], [place] [address]  Comprehensive metabolic panel     Status: Abnormal   Collection Time: 09/04/22  9:38 AM  Result Value Ref Range   Sodium 144 135 - 145 mmol/L   Potassium 5.1 3.5 - 5.1 mmol/L    Comment: HEMOLYSIS AT THIS LEVEL MAY AFFECT RESULT   Chloride 110 98 - 111 mmol/L   CO2 8 (L) 22 - 32 mmol/L   Glucose, Bld 275 (H) 70 - 99 mg/dL    Comment: Glucose reference range applies only to samples taken after fasting for at least 8 hours.   BUN 23 8 - 23 mg/dL   Creatinine, Ser 1.68 (H) 0.44 - 1.00 mg/dL   Calcium 11.0 (H) 8.9 - 10.3 mg/dL   Total Protein 7.5 6.5 - 8.1 g/dL    Albumin 3.7 3.5 - 5.0 g/dL   AST 23 15 - 41 U/L    Comment: HEMOLYSIS AT THIS LEVEL MAY AFFECT RESULT   ALT 13 0 - 44 U/L    Comment: HEMOLYSIS AT THIS LEVEL MAY AFFECT RESULT   Alkaline Phosphatase 93 38 - 126 U/L   Total Bilirubin 2.0 (H) 0.3 - 1.2 mg/dL    Comment: HEMOLYSIS AT THIS LEVEL MAY AFFECT RESULT   GFR, Estimated 32 (L) >60 mL/min    Comment: (NOTE) Calculated using the CKD-EPI Creatinine Equation (2021)    Anion gap 26 (H) 5 - 15    Comment: ELECTROLYTES REPEATED TO VERIFY Performed at [facility], [employer] [address], [place], [place] [address]   CBC     Status: Abnormal   Collection Time: 09/04/22  9:38 AM  Result Value Ref Range   WBC 14.5 (H) 4.0 - 10.5 K/uL   RBC 5.27 (H) 3.87 - 5.11 MIL/uL   Hemoglobin 16.2 (H) 12.0 - 15.0 g/dL   HCT 52.7 (H) 36.0 - 46.0 %   MCV 100.0 80.0 - 100.0 fL   MCH 30.7 26.0 - 34.0 pg   MCHC 30.7 30.0 - 36.0 g/dL   RDW 13.5 11.5 - 15.5 %   Platelets 416 (H) 150 - 400 K/uL   nRBC 0.0 0.0 - 0.2 %    Comment: Performed at [facility], [place] [address] [place], [place] [address]  Magnesium     Status: None   Collection Time: 09/04/22  9:38 AM  Result Value Ref Range   Magnesium 2.2 1.7 - 2.4 mg/dL    Comment: Performed at [facility] [address] [place], [place] [address]  Resp panel by RT-PCR (RSV, Flu A&B, Covid) Anterior Nasal Swab     Status: None   Collection Time: 09/04/22 11:16 AM   Specimen: Anterior Nasal Swab  Result Value Ref Range   SARS Coronavirus 2 by RT PCR NEGATIVE NEGATIVE   Influenza A by PCR NEGATIVE NEGATIVE   Influenza B by PCR NEGATIVE NEGATIVE    Comment: (NOTE) The Xpert Xpress SARS-CoV-2/FLU/RSV plus assay is intended as an aid in the diagnosis of influenza from Nasopharyngeal swab specimens and should not be  used as a sole basis for treatment. Nasal washings and aspirates are unacceptable for Xpert Xpress SARS-CoV-2/FLU/RSV testing.  Fact Sheet for  Patients: EntrepreneurPulse.com.au  Fact Sheet for Healthcare Providers: IncredibleEmployment.be  This test is not yet approved or cleared by the [place] FDA and has been authorized for detection and/or diagnosis of SARS-CoV-2 by FDA under an Emergency Use Authorization (EUA). This EUA will remain in effect (meaning this test can be used) for the duration of the COVID-19 declaration under Section 564(b)(1) of the Act, 21 U.S.C. section 360bbb-3(b)(1), unless the authorization is terminated or revoked.     Resp Syncytial Virus by PCR NEGATIVE NEGATIVE    Comment: (NOTE) Fact Sheet for Patients: EntrepreneurPulse.com.au  Fact Sheet for Healthcare Providers: IncredibleEmployment.be  This test is not yet approved or cleared by the [place] FDA and has been authorized for detection and/or diagnosis of SARS-CoV-2 by FDA under an Emergency Use Authorization (EUA). This EUA will remain in effect (meaning this test can be used) for the duration of the COVID-19 declaration under Section 564(b)(1) of the Act, 21 U.S.C. section 360bbb-3(b)(1), unless the authorization is terminated or revoked.  Performed at [facility], [place] [address], [place], [place] [address]   Beta-hydroxybutyric acid     Status: Abnormal   Collection Time: 09/04/22 11:27 AM  Result Value Ref Range   Beta-Hydroxybutyric Acid >8.00 (H) 0.05 - 0.27 mmol/L    Comment: RESULT CONFIRMED BY MANUAL DILUTION Performed at [facility] [address], [place], [place] [address]   I-Stat venous blood gas, [facility], [facility], [facility])     Status: Abnormal   Collection Time: 09/04/22 11:34 AM  Result Value Ref Range   pH, Ven 7.131 (LL) 7.25 - 7.43   pCO2, Ven 23.3 (L) 44 - 60 mmHg   pO2, Ven 22 (LL) 32 - 45 mmHg   Bicarbonate 7.8 (L) 20.0 - 28.0 mmol/L   TCO2 8 (L) 22 - 32 mmol/L   O2 Saturation 25 %   Acid-base deficit 20.0 (H) 0.0 -  2.0 mmol/L   Sodium 144 135 - 145 mmol/L   Potassium 5.0 3.5 - 5.1 mmol/L   Calcium, Ion 1.48 (H) 1.15 - 1.40 mmol/L   HCT 45.0 36.0 - 46.0 %   Hemoglobin 15.3 (H) 12.0 - 15.0 g/dL   Sample type VENOUS    Comment NOTIFIED PHYSICIAN   CBG monitoring, ED     Status: Abnormal   Collection Time: 09/04/22 12:03 PM  Result Value Ref Range   Glucose-Capillary 229 (H) 70 - 99 mg/dL    Comment: Glucose reference range applies only to samples taken after fasting for at least 8 hours.  CBG monitoring, ED     Status: Abnormal   Collection Time: 09/04/22  1:10 PM  Result Value Ref Range   Glucose-Capillary 179 (H) 70 - 99 mg/dL    Comment: Glucose reference range applies only to samples taken after fasting for at least 8 hours.   DG Chest Port 1 View  Result Date: 09/04/2022 CLINICAL DATA:  SHOB EXAM: PORTABLE CHEST 1 VIEW COMPARISON:  Chest x-ray September 02, 2022. FINDINGS: Mild subsegmental left basilar streaky opacities. Nodular opacity in the right midlung. Cardiomediastinal silhouette is within normal limits. CABG and median sternotomy. ACDF. Cholecystectomy clips. IMPRESSION: 1. Nodular opacity in the right midlung. Recommend chest CT (preferably contrast) to further evaluate and exclude pulmonary nodule. 2. Mild subsegmental left basilar atelectasis. Electronically Signed   By: Jamesetta So.D.  On: 09/04/2022 10:04    Pending Labs Unresulted Labs (From admission, onward)     Start     Ordered   09/11/22 0500  Creatinine, serum  (enoxaparin (LOVENOX)    CrCl < 30 ml/min)  Once,   R       Comments: while on enoxaparin therapy.    09/04/22 1227   09/05/22 0500  CBC  Tomorrow morning,   R        09/04/22 1227   09/04/22 2200  Beta-hydroxybutyric acid  (Diabetes Ketoacidosis (DKA))  Now then every 8 hours,   R (with TIMED occurrences)      09/04/22 1227   09/04/22 [id number]  Basic metabolic panel  Once,   STAT        09/04/22 1410   09/04/22 [id number]  Basic metabolic panel  (Diabetes  Ketoacidosis (DKA))  STAT Now then every 4 hours ,   STAT      09/04/22 1145   09/04/22 0942  Urinalysis, Routine w reflex microscopic -Urine, Clean Catch  Once,   URGENT       Question:  Specimen Source  Answer:  Urine, Clean Catch   09/04/22 0941            Vitals/Pain Today's Vitals   09/04/22 1200 09/04/22 1215 09/04/22 1230 09/04/22 1315  BP: (!) 143/95 137/67 (!) 171/88   Pulse: 92 87 95   Resp: (!) 24 (!) 24 (!) 21   Temp:    98.9 F (37.2 C)  TempSrc:    Oral  SpO2: 100% 100% 100%   Weight:      Height:      PainSc:        Isolation Precautions No active isolations  Medications Medications  insulin regular, human (MYXREDLIN) 100 units/ 100 mL infusion (1.6 Units/hr Intravenous Rate/Dose Change 09/04/22 1313)  lactated ringers infusion ( Intravenous Not Given 09/04/22 1210)  dextrose 5 % in lactated ringers infusion ( Intravenous New Bag/Given 09/04/22 1315)  dextrose 50 % solution 0-50 mL (has no administration in time range)  famotidine (PEPCID) tablet 40 mg (has no administration in time range)  pantoprazole (PROTONIX) EC tablet 40 mg (has no administration in time range)  gabapentin (NEURONTIN) capsule 600 mg (has no administration in time range)  enoxaparin (LOVENOX) injection 30 mg (has no administration in time range)  ondansetron (ZOFRAN) injection 4 mg (has no administration in time range)  aspirin EC tablet 81 mg (has no administration in time range)  ondansetron (ZOFRAN) injection 4 mg (4 mg Intravenous Given 09/04/22 0949)  sodium chloride 0.9 % bolus 500 mL (0 mLs Intravenous Stopped 09/04/22 1027)  sodium chloride 0.9 % bolus 500 mL (0 mLs Intravenous Stopped 09/04/22 1205)  lactated ringers bolus 962 mL (0 mLs Intravenous Stopped 09/04/22 1351)    Mobility walks with person assist     Focused Assessments GI    R Recommendations: See Admitting Provider Note  Report given to:   Additional Notes:

## 2022-09-04 NOTE — Inpatient Diabetes Management (Signed)
Inpatient Diabetes Program Recommendations  AACE/ADA: New Consensus Statement on Inpatient Glycemic Control (2015)  Target Ranges:  Prepandial:   less than 140 mg/dL      Peak postprandial:   less than 180 mg/dL (1-2 hours)      Critically ill patients:  140 - 180 mg/dL   Lab Results  Component Value Date   GLUCAP 197 (H) 09/04/2022   HGBA1C 7.8 (H) 08/20/2022    Review of Glycemic Control  Latest Reference Range & Units 09/04/22 12:03 09/04/22 13:10 09/04/22 14:18  Glucose-Capillary 70 - 99 mg/dL 229 (H) 179 (H) 197 (H)  (H): Data is abnormally high Diabetes history: Type 2 DM Outpatient Diabetes medications: Invokana 300 mg QD, Metformin 500 mg QHS Current orders for Inpatient glycemic control: IV insulin  Inpatient Diabetes Program Recommendations:    In agreement with current plan. Do not recommend Invokana at discharge. Secure chat sent to MD.   Damaris Schooner with patient and daughter regarding DKA and outpatient diabetes management. Verified home medications and that patient was taking Invokana post surgery and had limited oral intake while recovering. Was seen at [facility] and was given Phenergan for "GI virus" per daughter.  Reviewed patient's current A1c of 7.8%. Explained what a A1c is and what it measures. Also reviewed goal A1c with patient, importance of good glucose control @ home, and blood sugar goals. Reviewed patho of DM, DKA, role of pancreas, impact of recent surgery with poor oral intake, SGLT side effects, euglycemic DKA, vascular changes and commorbidities.  Patient has a meter and testing supplies at home.  Encouraged patient to make PCP appointment and to hold on taking Invokana until intake improves and when MD resumes.  Answered questions.   Thanks, Bronson Curb, MSN, RNC-OB Diabetes Coordinator [phone] (8a-5p)

## 2022-09-04 NOTE — Progress Notes (Signed)
Contacted Dr. Earley Favor at [phone] in reference to the [facility] advising that patient glucose is stable and to begin transition orders.  Dr. Trilby Drummer advised patient was not at goal for glucose and to continue Endo[facility] process.

## 2022-09-04 NOTE — ED Triage Notes (Addendum)
Pt from home for eval of nausea, vomiting, diarrhea, abdominal pain, and generalized body aches since Monday. Recent cervical fusion by Dr. Lynann Bologna on 2/8. Has prescription of phenergan but states has vomited after taking it and is unable to tolerate any PO intake.

## 2022-09-04 NOTE — ED Provider Notes (Signed)
[facility] Provider Note   CSN: [id number] Arrival date & time: 09/04/22  [id number]     History  Chief Complaint  Patient presents with   Nausea   Generalized Body Aches    Disaya Kozloff is a 77 y.o. female.  77 year old female brought in by EMS for generalized weakness with nausea and vomiting x 1 week.  Patient with recent anterior cervical decompression and fusion on 08/27/2022.  States that vomiting began same day and has been constant since.  She was seen at [facility] 2 days ago, provided with Phenergan which she has tried taking without improvement.  Reports inability to take anything by mouth.  Reports having voided this morning, has recently moved bowels without difficulty.  States abdominal aching which she suspects is secondary to her nausea and vomiting. Denies fevers, chills, dysuria.      PROCEDURE: 1. Anterior cervical decompression and fusion C4/5, C5/6, C6/7. 2. Placement of anterior instrumentation, C4-C7. 3. Insertion of interbody device x 3 (Titan intervertebral spacers). 4. Intraoperative use of fluoroscopy. 5. Use of morselized allograft - [employer].  Home Medications Prior to Admission medications   Medication Sig Start Date End Date Taking? Authorizing Provider  Biotin 5000 MCG CAPS Take 5,000 mcg by mouth in the morning and at bedtime.    [provider]  canagliflozin (INVOKANA) 300 MG TABS tablet Take 300 mg by mouth daily.    [provider]  Cholecalciferol (VITAMIN D3) 50 MCG (2000 UT) TABS Take 50 mcg by mouth daily.    [provider]  Evolocumab (REPATHA SURECLICK) [id number] MG/ML SOAJ Inject 140 mg into the skin every 14 (fourteen) days. 11/14/21   Minus Breeding, MD  famotidine (PEPCID) 40 MG tablet Take 40 mg by mouth at bedtime.    [provider]  gabapentin (NEURONTIN) 300 MG capsule Take 1 capsule (300 mg total) by mouth 3 (three) times daily. Patient taking  differently: Take 600 mg by mouth 2 (two) times daily. 10/03/21   Derwood Kaplan, MD  HYDROcodone-acetaminophen (NORCO/VICODIN) 5-325 MG tablet Take 1 tablet by mouth every 6 (six) hours as needed for moderate pain or severe pain. 08/27/22 08/27/23  McKenzie, Lennie Muckle, PA-C  metFORMIN (GLUCOPHAGE-XR) 500 MG 24 hr tablet Take 500 mg by mouth at bedtime. 09/15/21   [provider]  methocarbamol (ROBAXIN) 750 MG tablet Take 1 tablet (750 mg total) by mouth every 6 (six) hours as needed for muscle spasms. 08/27/22   McKenzie, Lennie Muckle, PA-C  Multiple Vitamins-Minerals (PRESERVISION AREDS 2 PO) Take 1 tablet by mouth daily.    [provider]  nebivolol (BYSTOLIC) 5 MG tablet Take 5 mg by mouth daily. Takes with 18m to total 28m   [provider]  Nebivolol HCl (BYSTOLIC) 20 MG TABS Take 1 tablet by mouth daily. Takes with 5 mg to total 25 mg    [provider]  ondansetron (ZOFRAN) 4 MG tablet Take 1 tablet (4 mg total) by mouth every 8 (eight) hours as needed for nausea or vomiting. 08/27/22   McKenzie, KaLennie MucklePA-C  pantoprazole (PROTONIX) 40 MG tablet Take 40 mg by mouth daily.    [provider]  vitamin B-12 (CYANOCOBALAMIN) 500 MCG tablet Take 500 mcg by mouth daily.    [provider]      Allergies    Codeine, Ace inhibitors, Atorvastatin, Clarithromycin, Hydroxychloroquine sulfate, Iodine, Rosuvastatin, Avandamet [rosiglitazone-metformin], Betadine [povidone iodine], and Other  Review of Systems   Review of Systems Negative except as per HPI Physical Exam Updated Vital Signs BP (!) 165/75   Pulse 96   Temp 97.9 F (36.6 C) (Oral)   Resp (!) 24   Ht 5' 1"$  (1.549 m)   Wt 48.1 kg   SpO2 100%   BMI 20.03 kg/m  Physical Exam Vitals and nursing note reviewed.  Constitutional:      General: She is not in acute distress.    Appearance: She is well-developed. She is not diaphoretic.  HENT:     Head: Normocephalic and atraumatic.      Mouth/Throat:     Mouth: Mucous membranes are dry.  Eyes:     Conjunctiva/sclera: Conjunctivae normal.  Neck:     Comments: Wearing c-collar Cardiovascular:     Rate and Rhythm: Normal rate and regular rhythm.     Pulses: Normal pulses.     Heart sounds: Normal heart sounds.  Pulmonary:     Effort: Pulmonary effort is normal.     Breath sounds: Normal breath sounds.     Comments: Initially tachypneic however on further discussion is able to calm respirations which are then even and unlabored Abdominal:     Palpations: Abdomen is soft.     Tenderness: There is no abdominal tenderness.  Musculoskeletal:     Right lower leg: No edema.     Left lower leg: No edema.  Skin:    General: Skin is warm and dry.     Findings: No erythema or rash.  Neurological:     Mental Status: She is alert and oriented to person, place, and time.     Sensory: No sensory deficit.     Motor: No weakness.     Comments: No unilateral weakness  Psychiatric:        Behavior: Behavior normal.     ED Results / Procedures / Treatments   Labs (all labs ordered are listed, but only abnormal results are displayed) Labs Reviewed  LIPASE, BLOOD - Abnormal; Notable for the following components:      Result Value   Lipase 52 (*)    All other components within normal limits  COMPREHENSIVE METABOLIC PANEL - Abnormal; Notable for the following components:   CO2 8 (*)    Glucose, Bld 275 (*)    Creatinine, Ser 1.68 (*)    Calcium 11.0 (*)    Total Bilirubin 2.0 (*)    GFR, Estimated 32 (*)    Anion gap 26 (*)    All other components within normal limits  CBC - Abnormal; Notable for the following components:   WBC 14.5 (*)    RBC 5.27 (*)    Hemoglobin 16.2 (*)    HCT 52.7 (*)    Platelets 416 (*)    All other components within normal limits  I-STAT VENOUS BLOOD GAS, ED - Abnormal; Notable for the following components:   pH, Ven 7.131 (*)    pCO2, Ven 23.3 (*)    pO2, Ven 22 (*)    Bicarbonate  7.8 (*)    TCO2 8 (*)    Acid-base deficit 20.0 (*)    Calcium, Ion 1.48 (*)    Hemoglobin 15.3 (*)    All other components within normal limits  CBG MONITORING, ED - Abnormal; Notable for the following components:   Glucose-Capillary 229 (*)    All other components within normal limits  RESP PANEL BY RT-PCR (RSV, FLU A&B, COVID)  RVPGX2  MAGNESIUM  URINALYSIS, ROUTINE W REFLEX MICROSCOPIC  BETA-HYDROXYBUTYRIC ACID  BASIC METABOLIC PANEL  BASIC METABOLIC PANEL  BASIC METABOLIC PANEL  BASIC METABOLIC PANEL  BETA-HYDROXYBUTYRIC ACID    EKG EKG Interpretation  Date/Time:  Friday September 04 2022 09:50:47 EST Ventricular Rate:  94 PR Interval:  139 QRS Duration: 94 QT Interval:  365 QTC Calculation: 457 R Axis:   -89 Text Interpretation: Sinus rhythm Probable left atrial enlargement Inferior infarct, old Anterior infarct, old Confirmed by Pattricia Boss [phone]) on 09/04/2022 11:14:53 AM  Radiology DG Chest Port 1 View  Result Date: 09/04/2022 CLINICAL DATA:  SHOB EXAM: PORTABLE CHEST 1 VIEW COMPARISON:  Chest x-ray September 02, 2022. FINDINGS: Mild subsegmental left basilar streaky opacities. Nodular opacity in the right midlung. Cardiomediastinal silhouette is within normal limits. CABG and median sternotomy. ACDF. Cholecystectomy clips. IMPRESSION: 1. Nodular opacity in the right midlung. Recommend chest CT (preferably contrast) to further evaluate and exclude pulmonary nodule. 2. Mild subsegmental left basilar atelectasis. Electronically Signed   By: Margaretha Sheffield M.D.   On: 09/04/2022 10:04    Procedures .Critical Care  Performed by: Tacy Learn, PA-C Authorized by: Tacy Learn, PA-C   Critical care provider statement:    Critical care time (minutes):  30   Critical care was time spent personally by me on the following activities:  Development of treatment plan with patient or surrogate, discussions with consultants, evaluation of patient's response to treatment,  examination of patient, ordering and review of laboratory studies, ordering and review of radiographic studies, ordering and performing treatments and interventions, pulse oximetry, re-evaluation of patient's condition and review of old charts     Medications Ordered in ED Medications  insulin regular, human (MYXREDLIN) 100 units/ 100 mL infusion (3.6 Units/hr Intravenous New Bag/Given 09/04/22 1210)  lactated ringers infusion ( Intravenous Not Given 09/04/22 1210)  dextrose 5 % in lactated ringers infusion (has no administration in time range)  dextrose 50 % solution 0-50 mL (has no administration in time range)  famotidine (PEPCID) tablet 40 mg (has no administration in time range)  pantoprazole (PROTONIX) EC tablet 40 mg (has no administration in time range)  gabapentin (NEURONTIN) capsule 600 mg (has no administration in time range)  enoxaparin (LOVENOX) injection 30 mg (has no administration in time range)  ondansetron (ZOFRAN) injection 4 mg (4 mg Intravenous Given 09/04/22 0949)  sodium chloride 0.9 % bolus 500 mL (0 mLs Intravenous Stopped 09/04/22 1027)  sodium chloride 0.9 % bolus 500 mL (0 mLs Intravenous Stopped 09/04/22 1205)  lactated ringers bolus 962 mL (962 mLs Intravenous New Bag/Given 09/04/22 1206)    ED Course/ Medical Decision Making/ A&P                             Medical Decision Making Amount and/or Complexity of Data Reviewed Labs: ordered. Radiology: ordered.  Risk Prescription drug management. Decision regarding hospitalization.   This patient presents to the ED for concern of generalized weakness with nausea and vomiting, this involves an extensive number of treatment options, and is a complaint that carries with it a high risk of complications and morbidity.  The differential diagnosis includes but not limited to metabolic derangement, dehydration, urinary tract infection, PE   Co morbidities that complicate the patient evaluation  Diabetes (no history  of DKA), hyperlipidemia, hypertension, GERD, remote history of left breast cancer, CABG x 3, CKD   Additional history obtained:  Additional history obtained from  EMS who assists with history as above External records from outside source obtained and reviewed including op note from 08/27/22 detailing patients recent c-spine surgery    Lab Tests:  I Ordered, and personally interpreted labs.  The pertinent results include: CBC with nonspecific leukocytosis of 14.5, elevated hemoglobin and hematocrit likely secondary to her dehydration.  CMP concerning for bicarb of 8 with glucose of 275 and anion gap of 26.  Also elevated creatinine of 1.68, previously 0.8 with GFR of 32.  Lipase mildly elevated at 52.  VBG with pH of 7.131   Imaging Studies ordered:  I ordered imaging studies including chest x-ray I independently visualized and interpreted imaging which showed right small opacity states can be further evaluated after properly hydrated or as outpatient if recommended) I agree with the radiologist interpretation   Cardiac Monitoring: / EKG:  The patient was maintained on a cardiac monitor.  I personally viewed and interpreted the cardiac monitored which showed an underlying rhythm of: Sinus rhythm, rate 94   Consultations Obtained:  I requested consultation with the ER attending, Dr. Jeanell Sparrow,  and discussed lab and imaging findings as well as pertinent plan - they recommend: Endo tool, admit to hospitalist service Case discussed with Dr. Trilby Drummer with [facility] hospitalist service will consult for admission   Problem List / ED Course / Critical interventions / Medication management  77 year old female with history of diabetes, additional history listed in chart, presents after anterior C-spine fusion on August 27, 2022 with complaint of generalized weakness with nausea and vomiting, unable to tolerate anything p.o. for the past week.  Seen at [place] 2 days ago, unable to see labs however  discharge paperwork provided by patient notes metabolic acidosis with AKI.  Patient is tachypneic on presentation, abdomen is soft and nontender, oral mucous membranes dry, no lower extremity edema, sensorimotor intact all 4 extremities.  Is found to have a metabolic acidosis, started on Endo tool for management.  Chest x-ray with nonspecific opacity, consider pulmonary nodule which can be worked up at a later time.  Discussed with the ER attending, hospitalist, patient to be admitted for further monitoring and management. I ordered medication including Endo tool for management of DKA  Reevaluation of the patient after these medicines showed that the patient improved I have reviewed the patients home medicines and have made adjustments as needed   Social Determinants of Health:  Lives with family   Test / Admission - Considered:  Admit         Final Clinical Impression(s) / ED Diagnoses Final diagnoses:  AKI (acute kidney injury) ([place])  Diabetic ketoacidosis without coma associated with type 2 diabetes mellitus [facility])    Rx / DC Orders ED Discharge Orders     None         Tacy Learn, PA-C 09/04/22 1235    Pattricia Boss, MD 09/05/22 1527

## 2022-09-05 DIAGNOSIS — F419 Anxiety disorder, unspecified: Secondary | ICD-10-CM | POA: Diagnosis present

## 2022-09-05 DIAGNOSIS — Z91041 Radiographic dye allergy status: Secondary | ICD-10-CM | POA: Diagnosis not present

## 2022-09-05 DIAGNOSIS — N1832 Chronic kidney disease, stage 3b: Secondary | ICD-10-CM | POA: Diagnosis present

## 2022-09-05 DIAGNOSIS — I129 Hypertensive chronic kidney disease with stage 1 through stage 4 chronic kidney disease, or unspecified chronic kidney disease: Secondary | ICD-10-CM | POA: Diagnosis present

## 2022-09-05 DIAGNOSIS — Z87891 Personal history of nicotine dependence: Secondary | ICD-10-CM | POA: Diagnosis not present

## 2022-09-05 DIAGNOSIS — Z85828 Personal history of other malignant neoplasm of skin: Secondary | ICD-10-CM | POA: Diagnosis not present

## 2022-09-05 DIAGNOSIS — E1122 Type 2 diabetes mellitus with diabetic chronic kidney disease: Secondary | ICD-10-CM | POA: Diagnosis present

## 2022-09-05 DIAGNOSIS — Z885 Allergy status to narcotic agent status: Secondary | ICD-10-CM | POA: Diagnosis not present

## 2022-09-05 DIAGNOSIS — E876 Hypokalemia: Secondary | ICD-10-CM | POA: Diagnosis not present

## 2022-09-05 DIAGNOSIS — Z1152 Encounter for screening for COVID-19: Secondary | ICD-10-CM | POA: Diagnosis not present

## 2022-09-05 DIAGNOSIS — Z951 Presence of aortocoronary bypass graft: Secondary | ICD-10-CM | POA: Diagnosis not present

## 2022-09-05 DIAGNOSIS — Z853 Personal history of malignant neoplasm of breast: Secondary | ICD-10-CM | POA: Diagnosis not present

## 2022-09-05 DIAGNOSIS — E1151 Type 2 diabetes mellitus with diabetic peripheral angiopathy without gangrene: Secondary | ICD-10-CM | POA: Diagnosis present

## 2022-09-05 DIAGNOSIS — J9811 Atelectasis: Secondary | ICD-10-CM | POA: Diagnosis present

## 2022-09-05 DIAGNOSIS — K219 Gastro-esophageal reflux disease without esophagitis: Secondary | ICD-10-CM | POA: Diagnosis present

## 2022-09-05 DIAGNOSIS — N179 Acute kidney failure, unspecified: Secondary | ICD-10-CM | POA: Diagnosis present

## 2022-09-05 DIAGNOSIS — Z85820 Personal history of malignant melanoma of skin: Secondary | ICD-10-CM | POA: Diagnosis not present

## 2022-09-05 DIAGNOSIS — I10 Essential (primary) hypertension: Secondary | ICD-10-CM | POA: Diagnosis not present

## 2022-09-05 DIAGNOSIS — I2510 Atherosclerotic heart disease of native coronary artery without angina pectoris: Secondary | ICD-10-CM | POA: Diagnosis present

## 2022-09-05 DIAGNOSIS — E785 Hyperlipidemia, unspecified: Secondary | ICD-10-CM | POA: Diagnosis present

## 2022-09-05 DIAGNOSIS — Z8249 Family history of ischemic heart disease and other diseases of the circulatory system: Secondary | ICD-10-CM | POA: Diagnosis not present

## 2022-09-05 DIAGNOSIS — Z888 Allergy status to other drugs, medicaments and biological substances status: Secondary | ICD-10-CM | POA: Diagnosis not present

## 2022-09-05 DIAGNOSIS — F32A Depression, unspecified: Secondary | ICD-10-CM | POA: Diagnosis present

## 2022-09-05 DIAGNOSIS — E1110 Type 2 diabetes mellitus with ketoacidosis without coma: Secondary | ICD-10-CM | POA: Diagnosis present

## 2022-09-05 DIAGNOSIS — J479 Bronchiectasis, uncomplicated: Secondary | ICD-10-CM | POA: Diagnosis not present

## 2022-09-05 LAB — BASIC METABOLIC PANEL
Anion gap: 9 (ref 5–15)
Anion gap: 7 (ref 5–15)
Anion gap: 6 (ref 5–15)
BUN: 14 mg/dL (ref 8–23)
BUN: 12 mg/dL (ref 8–23)
BUN: 11 mg/dL (ref 8–23)
CO2: 15 mmol/L — ABNORMAL LOW (ref 22–32)
CO2: 17 mmol/L — ABNORMAL LOW (ref 22–32)
CO2: 17 mmol/L — ABNORMAL LOW (ref 22–32)
Calcium: 10 mg/dL (ref 8.9–10.3)
Calcium: 10.1 mg/dL (ref 8.9–10.3)
Calcium: 9.9 mg/dL (ref 8.9–10.3)
Chloride: 118 mmol/L — ABNORMAL HIGH (ref 98–111)
Chloride: 119 mmol/L — ABNORMAL HIGH (ref 98–111)
Chloride: 119 mmol/L — ABNORMAL HIGH (ref 98–111)
Creatinine, Ser: 0.82 mg/dL (ref 0.44–1.00)
Creatinine, Ser: 0.76 mg/dL (ref 0.44–1.00)
Creatinine, Ser: 0.73 mg/dL (ref 0.44–1.00)
GFR, Estimated: >60 mL/min (ref >60)
GFR, Estimated: >60 mL/min (ref >60)
GFR, Estimated: >60 mL/min (ref >60)
Glucose, Bld: 171 mg/dL — ABNORMAL HIGH (ref 70–99)
Glucose, Bld: 159 mg/dL — ABNORMAL HIGH (ref 70–99)
Glucose, Bld: 149 mg/dL — ABNORMAL HIGH (ref 70–99)
Potassium: 2.9 mmol/L — ABNORMAL LOW (ref 3.5–5.1)
Potassium: 2.8 mmol/L — ABNORMAL LOW (ref 3.5–5.1)
Potassium: 2.9 mmol/L — ABNORMAL LOW (ref 3.5–5.1)
Sodium: 142 mmol/L (ref 135–145)
Sodium: 143 mmol/L (ref 135–145)
Sodium: 142 mmol/L (ref 135–145)

## 2022-09-05 LAB — BETA-HYDROXYBUTYRIC ACID: Beta-Hydroxybutyric Acid: 0.54 mmol/L — ABNORMAL HIGH (ref 0.05–0.27)

## 2022-09-05 LAB — GLUCOSE, CAPILLARY
Glucose-Capillary: 126 mg/dL — ABNORMAL HIGH (ref 70–99)
Glucose-Capillary: 138 mg/dL — ABNORMAL HIGH (ref 70–99)
Glucose-Capillary: 138 mg/dL — ABNORMAL HIGH (ref 70–99)
Glucose-Capillary: 154 mg/dL — ABNORMAL HIGH (ref 70–99)
Glucose-Capillary: 157 mg/dL — ABNORMAL HIGH (ref 70–99)
Glucose-Capillary: 161 mg/dL — ABNORMAL HIGH (ref 70–99)
Glucose-Capillary: 167 mg/dL — ABNORMAL HIGH (ref 70–99)
Glucose-Capillary: 168 mg/dL — ABNORMAL HIGH (ref 70–99)

## 2022-09-05 LAB — CBC
HCT: 36.5 % (ref 36.0–46.0)
Hemoglobin: 12.2 g/dL (ref 12.0–15.0)
MCH: 30.5 pg (ref 26.0–34.0)
MCHC: 32.6 g/dL (ref 30.0–36.0)
MCV: 93.6 fL (ref 80.0–100.0)
Platelets: 271 10*3/uL (ref 150–400)
RBC: 3.9 MIL/uL (ref 3.87–5.11)
RDW: 13.4 % (ref 11.5–15.5)
WBC: 6.1 10*3/uL (ref 4.0–10.5)
nRBC: 0 % (ref 0.0–0.2)

## 2022-09-05 MED ORDER — ACETAMINOPHEN 325 MG PO TABS: 650.0000 mg | ORAL_TABLET | Freq: Four times a day (QID) | ORAL | Status: DC | PRN

## 2022-09-05 MED ORDER — POTASSIUM CHLORIDE 10 MEQ/100ML IV SOLN
10.0000 meq | INTRAVENOUS | Status: AC
  Administered 2022-09-05 (×4): 10 meq via INTRAVENOUS
  Filled 2022-09-05 (×4): qty 100

## 2022-09-05 MED ORDER — INSULIN GLARGINE-YFGN 100 UNIT/ML ~~LOC~~ SOLN
6.0000 [IU] | Freq: Every day | SUBCUTANEOUS | Status: DC
  Administered 2022-09-05 – 2022-09-07 (×3): 6 [IU] via SUBCUTANEOUS
  Filled 2022-09-05 (×3): qty 0.06

## 2022-09-05 MED ORDER — INSULIN ASPART 100 UNIT/ML IJ SOLN
0.0000 [IU] | Freq: Three times a day (TID) | INTRAMUSCULAR | Status: DC
  Administered 2022-09-05 – 2022-09-06 (×4): 2 [IU] via SUBCUTANEOUS
  Administered 2022-09-07 (×2): 1 [IU] via SUBCUTANEOUS

## 2022-09-05 NOTE — Evaluation (Signed)
Physical Therapy Evaluation Patient Details Name: Faith Gaines MRN: [id number] DOB: 1945-09-26 Today's Date: 09/05/2022  History of Present Illness  Patient is a 77 y.o.  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  Clinical Impression  Patient presents with decreased mobility due to generalized weakness, decreased balance and decreased activity tolerance.  Previous to her cervical surgery she was living independent.  Since her family has been providing assist, but she was still able to walk independently and took trips to the mailbox.  Currently she needs min A for bed mobility and using RW for short hallway ambulation.  Feel she will benefit from continued skilled PT in the acute setting and from follow up [place] at d/c.     Recommendations for follow up therapy are one component of a multi-disciplinary discharge planning process, led by the attending physician.  Recommendations may be updated based on patient status, additional functional criteria and insurance authorization.  Follow Up Recommendations Home health PT      Assistance Recommended at Discharge Intermittent Supervision/Assistance  Patient can return home with the following  A little help with walking and/or transfers;Assist for transportation;Help with stairs or ramp for entrance;A little help with bathing/dressing/bathroom;Assistance with cooking/housework    Equipment Recommendations None recommended by PT  Recommendations for Other Services       Functional Status Assessment Patient has had a recent decline in their functional status and demonstrates the ability to make significant improvements in function in a reasonable and predictable amount of time.     Precautions / Restrictions  Precautions Precautions: Fall;Cervical Required Braces or Orthoses: Cervical Brace Cervical Brace: Hard collar;At all times      Mobility  Bed Mobility Overal bed mobility: Needs Assistance Bed Mobility: Rolling, Sidelying to Sit Rolling: Supervision Sidelying to sit: HOB elevated, Min assist       General bed mobility comments: assist for lines and trunk    Transfers Overall transfer level: Needs assistance Equipment used: Rolling walker (2 wheels) Transfers: Sit to/from Stand Sit to Stand: Min assist           General transfer comment: assist for balance    Ambulation/Gait Ambulation/Gait assistance: Min guard, Min assist Gait Distance (Feet): 80 Feet Assistive device: Rolling walker (2 wheels) Gait Pattern/deviations: Step-through pattern, Decreased stride length       General Gait Details: assist with walker due to walker veering R and cues for repositioning around obstacles in hallway, assist for balance, more initially then fading  Stairs            Wheelchair Mobility    Modified Rankin (Stroke Patients Only)       Balance Overall balance assessment: Needs assistance Sitting-balance support: Feet supported Sitting balance-Leahy Scale: Fair     Standing balance support: During functional activity, Single extremity supported, No upper extremity supported Standing balance-Leahy Scale: Fair Standing balance comment: static standing without UE support, using sink to balance while brushing teeth at sink                             Pertinent Vitals/Pain Pain Assessment Pain Assessment: No/denies pain    Home Living Family/patient expects to be discharged to:: Private residence Living Arrangements: Alone Available Help at Discharge: Family;Available 24 hours/day  Type of Home: House Home Access: Stairs to enter Entrance Stairs-Rails: None Entrance Stairs-Number of Steps: 2   Home Layout: Two level;Able to live on main level  with bedroom/bathroom Home Equipment: Rolling Walker (2 wheels)      Prior Function Prior Level of Function : Independent/Modified Independent;Driving             Mobility Comments: active in church       Hand Dominance   Dominant Hand: Right    Extremity/Trunk Assessment   Upper Extremity Assessment Upper Extremity Assessment: Generalized weakness    Lower Extremity Assessment Lower Extremity Assessment: Generalized weakness    Cervical / Trunk Assessment Cervical / Trunk Assessment: Neck Surgery;Other exceptions Cervical / Trunk Exceptions: cervical collar  Communication   Communication: No difficulties  Cognition Arousal/Alertness: Awake/alert Behavior During Therapy: WFL for tasks assessed/performed Overall Cognitive Status: Within Functional Limits for tasks assessed                                          General Comments General comments (skin integrity, edema, etc.): daughter present and supportive and gave history; reports they will assist at d/c; VSS during session, assisted with hygiene initially as soiled from [place], pt toileted in bathroom end of session    Exercises     Assessment/Plan    PT Assessment Patient needs continued PT services  PT Problem List Decreased strength;Decreased balance;Decreased knowledge of use of DME;Decreased mobility;Decreased activity tolerance       PT Treatment Interventions DME instruction;Functional mobility training;Balance training;Patient/family education;Gait training;Therapeutic activities;Stair training;Therapeutic exercise    PT Goals (Current goals can be found in the Care Plan section)  Acute Rehab PT Goals Patient Stated Goal: return to independent PT Goal Formulation: With patient/family Time For Goal Achievement: 09/19/22 Potential to Achieve Goals: Good    Frequency Min 3X/week     Co-evaluation               AM-PAC PT "6 Clicks" Mobility  Outcome Measure Help  needed turning from your back to your side while in a flat bed without using bedrails?: A Little Help needed moving from lying on your back to sitting on the side of a flat bed without using bedrails?: A Little Help needed moving to and from a bed to a chair (including a wheelchair)?: A Little Help needed standing up from a chair using your arms (e.g., wheelchair or bedside chair)?: A Little Help needed to walk in hospital room?: A Little Help needed climbing 3-5 steps with a railing? : Total 6 Click Score: 16    End of Session Equipment Utilized During Treatment: Gait belt;Cervical collar Activity Tolerance: Patient tolerated treatment well Patient left: in bed;with call bell/phone within reach   PT Visit Diagnosis: Muscle weakness (generalized) (M62.81);Difficulty in walking, not elsewhere classified (R26.2)    Time: 1515-1600 PT Time Calculation (min) (ACUTE ONLY): 45 min   Charges:   PT Evaluation $PT Eval Moderate Complexity: 1 Mod PT Treatments $Gait Training: 8-22 mins $Therapeutic Activity: 8-22 mins        Magda Kiel, PT Acute Rehabilitation Services Office:[phone] 09/05/2022   Reginia Naas 09/05/2022, 4:58 PM

## 2022-09-05 NOTE — Inpatient Diabetes Management (Signed)
Inpatient Diabetes Program Recommendations  AACE/ADA: New Consensus Statement on Inpatient Glycemic Control (2015)  Target Ranges:  Prepandial:   less than 140 mg/dL      Peak postprandial:   less than 180 mg/dL (1-2 hours)      Critically ill patients:  140 - 180 mg/dL   Lab Results  Component Value Date   GLUCAP 138 (H) 09/05/2022   HGBA1C 7.8 (H) 08/20/2022    Diabetes history: Type 2 DM Outpatient Diabetes medications: Invokana 300 mg QD, Metformin 500 mg QHS Current orders for Inpatient glycemic control: IV insulin  Inpatient Diabetes Program Recommendations:   CO2 17, anion gap 7. Please consider when ready to transition off of IV insulin. -Semglee 5 units (0.1 units/kg x 48.1 kg)-give 2 hrs prior to IV insulin discontinued -Novolog 0-9 units q 4 hrs. (Cover CBG correction when IV insulin discontinued). While NPO, then tid, 0-5 units hs  Thank you, Rada Maaske. Regan Llorente, RN, MSN, CDE  Diabetes Coordinator Inpatient Glycemic Control Team Team Pager [phone] (8am-5pm) 09/05/2022 9:34 AM

## 2022-09-05 NOTE — Progress Notes (Addendum)
PROGRESS NOTE        PATIENT DETAILS Name: Faith Gaines Age: 77 y.o. Sex: female Date of Birth: [birth year] Admit Date: 09/04/2022 Admitting Physician Marcelyn Bruins, MD [id number], Elyse Jarvis, MD  Brief Summary: Patient is a 77 y.o.  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  Significant events: 2/16>> admit to [facility]  Significant studies: 2/16>> nodular opacity in the right midlung.  Significant microbiology data: 2/16>>COVID/influenza/RSV PCR: Negative  Procedures: None  Consults: None  Subjective: Feels better.  No longer with any nausea or vomiting.  Main complaint is weakness.  Objective: Vitals: Blood pressure (!) 142/63, pulse 77, temperature 98.3 F (36.8 C), temperature source Oral, resp. rate 20, height 5' 1"$  (1.549 m), weight 48.1 kg, SpO2 94 %.   Exam: Gen Exam:Alert awake-not in any distress HEENT:atraumatic, normocephalic.  Has cervical collar in place. Chest: B/L clear to auscultation anteriorly CVS:S1S2 regular Abdomen:soft non tender, non distended Extremities:no edema Neurology: Non focal Skin: no rash  Pertinent Labs/Radiology:    Latest Ref Rng & Units 09/05/2022    3:52 AM 09/04/2022   11:34 AM 09/04/2022    9:38 AM  CBC  WBC 4.0 - 10.5 K/uL 6.1   14.5   Hemoglobin 12.0 - 15.0 g/dL 12.2  15.3  16.2   Hematocrit 36.0 - 46.0 % 36.5  45.0  52.7   Platelets 150 - 400 K/uL 271   416     Lab Results  Component Value Date   NA 142 09/05/2022   K 2.9 (L) 09/05/2022   CL 119 (H) 09/05/2022   CO2 17 (L) 09/05/2022      Assessment/Plan: Diabetic ketoacidosis Sugars were never very elevated-some concern that this may be euglycemic  DKA due to canagliflozin use Anion gap is closed-will attempt to transition to SQ insulin Today Do not plan on resuming canagliflozin on discharge.  AKI Hemodynamically mediated Resolved with IVF/resolution of DKA (Does not appear to have CKD stage IIIb-ruled out)  Hypokalemia Likely due to IV insulin infusion Replete/recheck  Recent ACDF Cervical collar in place PT/OT  Nausea/Vomiting Resolved ?2/2 DKA Advance diet and see how she does  DM-2 (A1C 7.8 on 2/1) Planning to transition from Insulin gtt to Semglee 6 units/SSI Hold all oral hypoglycemics Given concern for Euglycemic DKA-no plans to resume canagliflozon on d/c  Recent Labs    09/05/22 0614 09/05/22 0742 09/05/22 0846  GLUCAP 126* 138* 138*    CAD s/p CABG 21 Postthoracotomy pain syndrome Continue ASA Resume Repatha postdischarge Resume beta-blocker when able Continue gabapentin  PAD Carotid artery disease Hyperlipidemia c/w ASA On Repatha outpatient   Hypertension BP relatively stable-resume Bystolic when able   GERD  Continue home PPI and Pepcid   History of breast cancer status postlumpectomy Resume outpatient follow-up with GI   Abnormal chest x-ray Nodular opacity noted on chest x-ray.   Will need CT chest prior to discharge.  Debility/deconditioning Due to acute illness-recently DCF PT/OT eval prior to d/c to ensure safe disposition  BMI Estimated body mass index is 20.03 kg/m as calculated from the following:   Height as of this encounter:  $5' 1"d$  (1.549 m).   Weight as of this encounter: 48.1 kg.   Code status:   Code Status: Full Code   DVT Prophylaxis: enoxaparin (LOVENOX) injection 30 mg Start: 09/04/22 2200   Family Communication: Son-[phone]-updated 2/17   Disposition Plan: Status is: Observation The patient will require care spanning > 2 midnights and should be moved to inpatient because: severity of illness   Planned Discharge  Destination:Home   Diet: Diet Order             Diet heart healthy/carb modified Fluid consistency: Thin  Diet effective now                     Antimicrobial agents: Anti-infectives (From admission, onward)    None        MEDICATIONS: Scheduled Meds:  aspirin EC  81 mg Oral Daily   enoxaparin (LOVENOX) injection  30 mg Subcutaneous Q24H   famotidine  40 mg Oral QHS   gabapentin  600 mg Oral BID   insulin aspart  0-9 Units Subcutaneous TID WC   insulin glargine-yfgn  6 Units Subcutaneous Daily   pantoprazole  40 mg Oral Daily   Continuous Infusions:  dextrose 5% lactated ringers 125 mL/hr at 09/05/22 0356   insulin 1.1 Units/hr (09/05/22 0848)   lactated ringers     potassium chloride 10 mEq (09/05/22 0946)   PRN Meds:.dextrose, ondansetron (ZOFRAN) IV   I have personally reviewed following labs and imaging studies  LABORATORY DATA: CBC: Recent Labs  Lab 09/04/22 0938 09/04/22 1134 09/05/22 0352  WBC 14.5*  --  6.1  HGB 16.2* 15.3* 12.2  HCT 52.7* 45.0 36.5  MCV 100.0  --  93.6  PLT 416*  --  99991111    Basic Metabolic Panel: Recent Labs  Lab 09/04/22 0938 09/04/22 1134 09/04/22 1628 09/04/22 2017 09/05/22 0038 09/05/22 0352 09/05/22 0805  NA 144   < > 145 144 142 143 142  K 5.1   < > 4.2 3.8 2.9* 2.8* 2.9*  CL 110   < > 117* 119* 118* 119* 119*  CO2 8*   < > 13* 15* 15* 17* 17*  GLUCOSE 275*   < > 191* 185* 171* 159* 149*  BUN 23   < > 17 15 14 12 11  $ CREATININE 1.68*   < > 1.24* 1.06* 0.82 0.76 0.73  CALCIUM 11.0*   < > 10.2 10.1 10.0 10.1 9.9  MG 2.2  --   --   --   --   --   --    < > = values in this interval not displayed.    GFR: Estimated Creatinine Clearance: 46.6 mL/min (by C-G formula based on SCr of 0.73 mg/dL).  Liver Function Tests: Recent Labs  Lab 09/04/22 0938  AST 23  ALT 13  ALKPHOS 93  BILITOT 2.0*  PROT 7.5  ALBUMIN 3.7   Recent Labs  Lab 09/04/22 0938  LIPASE 52*   No results for input(s):  "AMMONIA" in the last 168 hours.  Coagulation Profile: No results for input(s): "INR", "PROTIME" in the last 168 hours.  Cardiac Enzymes: No results for input(s): "CKTOTAL", "CKMB", "CKMBINDEX", "TROPONINI" in the last 168 hours.  BNP (last 3 results) No results for input(s): "PROBNP" in the last 8760 hours.  Lipid Profile: No results for input(s): "CHOL", "HDL", "LDLCALC", "TRIG", "CHOLHDL", "LDLDIRECT" in the last 72 hours.  Thyroid Function Tests: No results for input(s): "TSH", "T4TOTAL", "FREET4", "T3FREE", "THYROIDAB" in the  last 72 hours.  Anemia Panel: No results for input(s): "VITAMINB12", "FOLATE", "FERRITIN", "TIBC", "IRON", "RETICCTPCT" in the last 72 hours.  Urine analysis:    Component Value Date/Time   COLORURINE STRAW (A) 09/04/2022 1505   APPEARANCEUR CLEAR 09/04/2022 1505   LABSPEC 1.018 09/04/2022 1505   PHURINE 5.0 09/04/2022 1505   GLUCOSEU >=500 (A) 09/04/2022 1505   HGBUR NEGATIVE 09/04/2022 1505   BILIRUBINUR NEGATIVE 09/04/2022 1505   KETONESUR 80 (A) 09/04/2022 1505   PROTEINUR NEGATIVE 09/04/2022 1505   UROBILINOGEN 0.2 06/28/2013 1330   NITRITE NEGATIVE 09/04/2022 1505   LEUKOCYTESUR NEGATIVE 09/04/2022 1505    Sepsis Labs: Lactic Acid, Venous No results found for: "LATICACIDVEN"  MICROBIOLOGY: Recent Results (from the past 240 hour(s))  Resp panel by RT-PCR (RSV, Flu A&B, Covid) Anterior Nasal Swab     Status: None   Collection Time: 09/04/22 11:16 AM   Specimen: Anterior Nasal Swab  Result Value Ref Range Status   SARS Coronavirus 2 by RT PCR NEGATIVE NEGATIVE Final   Influenza A by PCR NEGATIVE NEGATIVE Final   Influenza B by PCR NEGATIVE NEGATIVE Final    Comment: (NOTE) The Xpert Xpress SARS-CoV-2/FLU/RSV plus assay is intended as an aid in the diagnosis of influenza from Nasopharyngeal swab specimens and should not be used as a sole basis for treatment. Nasal washings and aspirates are unacceptable for Xpert Xpress  SARS-CoV-2/FLU/RSV testing.  Fact Sheet for Patients: EntrepreneurPulse.com.au  Fact Sheet for Healthcare Providers: IncredibleEmployment.be  This test is not yet approved or cleared by the [place] FDA and has been authorized for detection and/or diagnosis of SARS-CoV-2 by FDA under an Emergency Use Authorization (EUA). This EUA will remain in effect (meaning this test can be used) for the duration of the COVID-19 declaration under Section 564(b)(1) of the Act, 21 U.S.C. section 360bbb-3(b)(1), unless the authorization is terminated or revoked.     Resp Syncytial Virus by PCR NEGATIVE NEGATIVE Final    Comment: (NOTE) Fact Sheet for Patients: EntrepreneurPulse.com.au  Fact Sheet for Healthcare Providers: IncredibleEmployment.be  This test is not yet approved or cleared by the [place] FDA and has been authorized for detection and/or diagnosis of SARS-CoV-2 by FDA under an Emergency Use Authorization (EUA). This EUA will remain in effect (meaning this test can be used) for the duration of the COVID-19 declaration under Section 564(b)(1) of the Act, 21 U.S.C. section 360bbb-3(b)(1), unless the authorization is terminated or revoked.  Performed at [facility], [place] [address] [place], [place] [address]     RADIOLOGY STUDIES/RESULTS: DG Chest Port 1 View  Result Date: 09/04/2022 CLINICAL DATA:  SHOB EXAM: PORTABLE CHEST 1 VIEW COMPARISON:  Chest x-ray September 02, 2022. FINDINGS: Mild subsegmental left basilar streaky opacities. Nodular opacity in the right midlung. Cardiomediastinal silhouette is within normal limits. CABG and median sternotomy. ACDF. Cholecystectomy clips. IMPRESSION: 1. Nodular opacity in the right midlung. Recommend chest CT (preferably contrast) to further evaluate and exclude pulmonary nodule. 2. Mild subsegmental left basilar atelectasis. Electronically Signed   By:  Margaretha Sheffield M.D.   On: 09/04/2022 10:04     LOS: 0 days   Oren Binet, MD  [facility]    To contact the attending provider between 7A-7P or the covering provider during after hours 7P-7A, please log into the web site [email or web address] and access using universal [place] password for that web site. If you do not have the password, please call the hospital operator.  09/05/2022, 10:36 AM  AKI AKI,

## 2022-09-06 DIAGNOSIS — K219 Gastro-esophageal reflux disease without esophagitis: Secondary | ICD-10-CM | POA: Diagnosis not present

## 2022-09-06 DIAGNOSIS — I10 Essential (primary) hypertension: Secondary | ICD-10-CM | POA: Diagnosis not present

## 2022-09-06 DIAGNOSIS — N179 Acute kidney failure, unspecified: Secondary | ICD-10-CM | POA: Diagnosis not present

## 2022-09-06 DIAGNOSIS — E1151 Type 2 diabetes mellitus with diabetic peripheral angiopathy without gangrene: Secondary | ICD-10-CM | POA: Diagnosis not present

## 2022-09-06 LAB — BASIC METABOLIC PANEL
Anion gap: 6 (ref 5–15)
BUN: 5 mg/dL — ABNORMAL LOW (ref 8–23)
CO2: 24 mmol/L (ref 22–32)
Calcium: 9.6 mg/dL (ref 8.9–10.3)
Chloride: 110 mmol/L (ref 98–111)
Creatinine, Ser: 0.63 mg/dL (ref 0.44–1.00)
GFR, Estimated: >60 mL/min (ref >60)
Glucose, Bld: 154 mg/dL — ABNORMAL HIGH (ref 70–99)
Potassium: 3 mmol/L — ABNORMAL LOW (ref 3.5–5.1)
Sodium: 140 mmol/L (ref 135–145)

## 2022-09-06 LAB — GLUCOSE, CAPILLARY
Glucose-Capillary: 161 mg/dL — ABNORMAL HIGH (ref 70–99)
Glucose-Capillary: 189 mg/dL — ABNORMAL HIGH (ref 70–99)
Glucose-Capillary: 117 mg/dL — ABNORMAL HIGH (ref 70–99)
Glucose-Capillary: 112 mg/dL — ABNORMAL HIGH (ref 70–99)

## 2022-09-06 LAB — MAGNESIUM: Magnesium: 1.5 mg/dL — ABNORMAL LOW (ref 1.7–2.4)

## 2022-09-06 MED ORDER — NEBIVOLOL HCL 10 MG PO TABS
20.0000 mg | ORAL_TABLET | Freq: Every day | ORAL | Status: DC
  Administered 2022-09-06 – 2022-09-07 (×2): 20 mg via ORAL
  Filled 2022-09-06 (×2): qty 2

## 2022-09-06 MED ORDER — POTASSIUM CHLORIDE CRYS ER 20 MEQ PO TBCR
40.0000 meq | EXTENDED_RELEASE_TABLET | ORAL | Status: AC
  Administered 2022-09-06 (×2): 40 meq via ORAL
  Filled 2022-09-06 (×2): qty 2

## 2022-09-06 MED ORDER — MAGNESIUM SULFATE 4 GM/100ML IV SOLN
4.0000 g | Freq: Once | INTRAVENOUS | Status: AC
  Administered 2022-09-06: 4 g via INTRAVENOUS
  Filled 2022-09-06: qty 100

## 2022-09-06 MED ORDER — DIPHENHYDRAMINE HCL 25 MG PO CAPS
25.0000 mg | ORAL_CAPSULE | Freq: Once | ORAL | Status: AC | PRN
  Administered 2022-09-07: 25 mg via ORAL
  Filled 2022-09-06: qty 1

## 2022-09-06 NOTE — Progress Notes (Addendum)
PROGRESS NOTE        PATIENT DETAILS Name: Faith Gaines Age: 77 y.o. Sex: female Date of Birth: [birth year] Admit Date: 09/04/2022 Admitting Physician Evalee Mutton Kristeen Mans, MD [id number], Elyse Jarvis, MD  Brief Summary: Patient is a 77 y.o.  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  Significant events: 2/16>> admit to [facility]  Significant studies: 2/16>> nodular opacity in the right midlung.  Significant microbiology data: 2/16>>COVID/influenza/RSV PCR: Negative  Procedures: None  Consults: None  Subjective: Appetite is not that great-she is burping quite a bit some nausea persist.  But overall better.  Continues to feel weak.  Objective: Vitals: Blood pressure (!) 156/63, pulse 66, temperature 97.8 F (36.6 C), temperature source Oral, resp. rate 19, height 5' 1"$  (1.549 m), weight 48.1 kg, SpO2 95 %.   Exam: Gen Exam:Alert awake-not in any distress HEENT:atraumatic, normocephalic Chest: B/L clear to auscultation anteriorly CVS:S1S2 regular Abdomen:soft non tender, non distended Extremities:no edema Neurology: Non focal Skin: no rash  Pertinent Labs/Radiology:    Latest Ref Rng & Units 09/05/2022    3:52 AM 09/04/2022   11:34 AM 09/04/2022    9:38 AM  CBC  WBC 4.0 - 10.5 K/uL 6.1   14.5   Hemoglobin 12.0 - 15.0 g/dL 12.2  15.3  16.2   Hematocrit 36.0 - 46.0 % 36.5  45.0  52.7   Platelets 150 - 400 K/uL 271   416     Lab Results  Component Value Date   NA 140 09/06/2022   K 3.0 (L) 09/06/2022   CL 110 09/06/2022   CO2 24 09/06/2022      Assessment/Plan: Diabetic ketoacidosis Sugars were never very elevated-some concern that this may be euglycemic DKA  due to canagliflozin use Has been transitioned to SQ insulin No plans to resume Invokana on discharge.  AKI Hemodynamically mediated Resolved with IVF/resolution of DKA (Does not appear to have CKD stage IIIb-ruled out)  Hypokalemia Replete/recheck  Hypomagnesemia Replete/recheck  Recent ACDF Cervical collar in place PT/OT  Nausea/Vomiting Overall better-still nauseous-suspect this was secondary to DKA Appetite is not that great Will stop IV fluid-continue supportive care and see how she does.  DM-2 (A1C 7.8 on 2/1) Continue SQ insulin for now-CBG stable As noted above-no plans to resume Invokana on discharge  Should be able to resume metformin and Ozempic.   Recent Labs    09/05/22 1154 09/05/22 1705 09/06/22 0812  GLUCAP 167* 154* 161*     CAD s/p CABG 21 Postthoracotomy pain syndrome Continue ASA-BP stable-beta-blocker resumed. Resume Repatha postdischarge Continue gabapentin  PAD Carotid artery disease Hyperlipidemia c/w ASA On Repatha outpatient   Hypertension BP on the higher side-Bystolic resumed. Follow   GERD  Continue home PPI and Pepcid   History of breast cancer status postlumpectomy Resume outpatient follow-up with GI   Abnormal chest x-ray Nodular opacity noted on chest x-ray.   Will pursue a CT chest today.  Debility/deconditioning Due to acute illness-recent ACDF PT/OT recommend home health  BMI Estimated body mass index is 20.03 kg/m as calculated from the following:   Height as of this encounter: 5' 1"$  (  1.549 m).   Weight as of this encounter: 48.1 kg.   Code status:   Code Status: Full Code   DVT Prophylaxis: enoxaparin (LOVENOX) injection 30 mg Start: 09/04/22 2200   Family Communication:  Daughter at bedside on 2/18 Son-[phone]-updated 2/17   Disposition Plan: Status is: Observation The patient will require care spanning > 2 midnights and should be moved to inpatient because: severity of illness    Planned Discharge Destination:Home likely on 2/19-if diet remains stable   Diet: Diet Order             Diet heart healthy/carb modified Fluid consistency: Thin  Diet effective now                     Antimicrobial agents: Anti-infectives (From admission, onward)    None        MEDICATIONS: Scheduled Meds:  aspirin EC  81 mg Oral Daily   enoxaparin (LOVENOX) injection  30 mg Subcutaneous Q24H   famotidine  40 mg Oral QHS   gabapentin  600 mg Oral BID   insulin aspart  0-9 Units Subcutaneous TID WC   insulin glargine-yfgn  6 Units Subcutaneous Daily   nebivolol  20 mg Oral Daily   pantoprazole  40 mg Oral Daily   potassium chloride  40 mEq Oral Q4H   Continuous Infusions:  insulin Stopped (09/05/22 1205)   PRN Meds:.acetaminophen, dextrose, ondansetron (ZOFRAN) IV   I have personally reviewed following labs and imaging studies  LABORATORY DATA: CBC: Recent Labs  Lab 09/04/22 0938 09/04/22 1134 09/05/22 0352  WBC 14.5*  --  6.1  HGB 16.2* 15.3* 12.2  HCT 52.7* 45.0 36.5  MCV 100.0  --  93.6  PLT 416*  --  271     Basic Metabolic Panel: Recent Labs  Lab 09/04/22 0938 09/04/22 1134 09/04/22 2017 09/05/22 0038 09/05/22 0352 09/05/22 0805 09/06/22 0433  NA 144   < > 144 142 143 142 140  K 5.1   < > 3.8 2.9* 2.8* 2.9* 3.0*  CL 110   < > 119* 118* 119* 119* 110  CO2 8*   < > 15* 15* 17* 17* 24  GLUCOSE 275*   < > 185* 171* 159* 149* 154*  BUN 23   < > 15 14 12 11 $ 5*  CREATININE 1.68*   < > 1.06* 0.82 0.76 0.73 0.63  CALCIUM 11.0*   < > 10.1 10.0 10.1 9.9 9.6  MG 2.2  --   --   --   --   --  1.5*   < > = values in this interval not displayed.     GFR: Estimated Creatinine Clearance: 46.6 mL/min (by C-G formula based on SCr of 0.63 mg/dL).  Liver Function Tests: Recent Labs  Lab 09/04/22 0938  AST 23  ALT 13  ALKPHOS 93  BILITOT 2.0*  PROT 7.5  ALBUMIN 3.7    Recent Labs  Lab 09/04/22 0938  LIPASE 52*    No results for  input(s): "AMMONIA" in the last 168 hours.  Coagulation Profile: No results for input(s): "INR", "PROTIME" in the last 168 hours.  Cardiac Enzymes: No results for input(s): "CKTOTAL", "CKMB", "CKMBINDEX", "TROPONINI" in the last 168 hours.  BNP (last 3 results) No results for input(s): "PROBNP" in the last 8760 hours.  Lipid Profile: No results for input(s): "CHOL", "HDL", "LDLCALC", "TRIG", "CHOLHDL", "LDLDIRECT" in the last 72 hours.  Thyroid Function Tests: No results for input(s): "TSH", "T4TOTAL", "FREET4", "  T3FREE", "THYROIDAB" in the last 72 hours.  Anemia Panel: No results for input(s): "VITAMINB12", "FOLATE", "FERRITIN", "TIBC", "IRON", "RETICCTPCT" in the last 72 hours.  Urine analysis:    Component Value Date/Time   COLORURINE STRAW (A) 09/04/2022 1505   APPEARANCEUR CLEAR 09/04/2022 1505   LABSPEC 1.018 09/04/2022 1505   PHURINE 5.0 09/04/2022 1505   GLUCOSEU >=500 (A) 09/04/2022 1505   HGBUR NEGATIVE 09/04/2022 1505   BILIRUBINUR NEGATIVE 09/04/2022 1505   KETONESUR 80 (A) 09/04/2022 1505   PROTEINUR NEGATIVE 09/04/2022 1505   UROBILINOGEN 0.2 06/28/2013 1330   NITRITE NEGATIVE 09/04/2022 1505   LEUKOCYTESUR NEGATIVE 09/04/2022 1505    Sepsis Labs: Lactic Acid, Venous No results found for: "LATICACIDVEN"  MICROBIOLOGY: Recent Results (from the past 240 hour(s))  Resp panel by RT-PCR (RSV, Flu A&B, Covid) Anterior Nasal Swab     Status: None   Collection Time: 09/04/22 11:16 AM   Specimen: Anterior Nasal Swab  Result Value Ref Range Status   SARS Coronavirus 2 by RT PCR NEGATIVE NEGATIVE Final   Influenza A by PCR NEGATIVE NEGATIVE Final   Influenza B by PCR NEGATIVE NEGATIVE Final    Comment: (NOTE) The Xpert Xpress SARS-CoV-2/FLU/RSV plus assay is intended as an aid in the diagnosis of influenza from Nasopharyngeal swab specimens and should not be used as a sole basis for treatment. Nasal washings and aspirates are unacceptable for Xpert Xpress  SARS-CoV-2/FLU/RSV testing.  Fact Sheet for Patients: EntrepreneurPulse.com.au  Fact Sheet for Healthcare Providers: IncredibleEmployment.be  This test is not yet approved or cleared by the [place] FDA and has been authorized for detection and/or diagnosis of SARS-CoV-2 by FDA under an Emergency Use Authorization (EUA). This EUA will remain in effect (meaning this test can be used) for the duration of the COVID-19 declaration under Section 564(b)(1) of the Act, 21 U.S.C. section 360bbb-3(b)(1), unless the authorization is terminated or revoked.     Resp Syncytial Virus by PCR NEGATIVE NEGATIVE Final    Comment: (NOTE) Fact Sheet for Patients: EntrepreneurPulse.com.au  Fact Sheet for Healthcare Providers: IncredibleEmployment.be  This test is not yet approved or cleared by the [place] FDA and has been authorized for detection and/or diagnosis of SARS-CoV-2 by FDA under an Emergency Use Authorization (EUA). This EUA will remain in effect (meaning this test can be used) for the duration of the COVID-19 declaration under Section 564(b)(1) of the Act, 21 U.S.C. section 360bbb-3(b)(1), unless the authorization is terminated or revoked.  Performed at [facility], [place] [address], [place], [place] [address]     RADIOLOGY STUDIES/RESULTS: No results found.   LOS: 1 day   Oren Binet, MD  [facility]    To contact the attending provider between 7A-7P or the covering provider during after hours 7P-7A, please log into the web site [email or web address] and access using universal [place] password for that web site. If you do not have the password, please call the hospital operator.  09/06/2022, 9:56 AM   AKI AKI,

## 2022-09-06 NOTE — Plan of Care (Signed)

## 2022-09-06 NOTE — Plan of Care (Signed)

## 2022-09-06 NOTE — Evaluation (Signed)
Occupational Therapy Evaluation Patient Details Name: Faith Gaines MRN: [id number] DOB: [birth year] Today's Date: 09/06/2022   History of Present Illness Patient is a 77 y.o.  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   Clinical Impression   PTA, pt recently home after ACDF and was mod I within there home. Upon eval, pt performing ADL at supervision level with up to min cues for use of compensatory techniques to maintain cervical precautions. Pt re-educated and performing LB dressing and bed mobility during session within precautions. Additionally reviewed compensatory techniques for grooming. Recommending discharge home with family to assist as needed. DO not anticipate need for OT follow up at this time.      Recommendations for follow up therapy are one component of a multi-disciplinary discharge planning process, led by the attending physician.  Recommendations may be updated based on patient status, additional functional criteria and insurance authorization.   Follow Up Recommendations  No OT follow up     Assistance Recommended at Discharge Intermittent Supervision/Assistance  Patient can return home with the following A little help with walking and/or transfers;A little help with bathing/dressing/bathroom;Assistance with cooking/housework;Assist for transportation;Help with stairs or ramp for entrance    Functional Status Assessment  Patient has had a recent decline in their functional status and demonstrates the ability to make significant improvements in function in a reasonable and predictable amount of time.  Equipment Recommendations  None recommended by OT    Recommendations for Other Services       Precautions / Restrictions  Precautions Precautions: Fall;Cervical Required Braces or Orthoses: Cervical Brace Cervical Brace: Hard collar;At all times Restrictions Weight Bearing Restrictions: No      Mobility Bed Mobility Overal bed mobility: Needs Assistance Bed Mobility: Rolling, Sidelying to Sit Rolling: Supervision Sidelying to sit: Supervision       General bed mobility comments: assist for lines only. Pt coming to EOB without A    Transfers Overall transfer level: Needs assistance Equipment used: Rolling walker (2 wheels) Transfers: Sit to/from Stand Sit to Stand: Supervision           General transfer comment: supervision for safety      Balance Overall balance assessment: Needs assistance Sitting-balance support: Feet supported Sitting balance-Leahy Scale: Fair     Standing balance support: During functional activity, Single extremity supported, No upper extremity supported Standing balance-Leahy Scale: Fair                             ADL either performed or assessed with clinical judgement   ADL Overall ADL's : Needs assistance/impaired Eating/Feeding: Independent   Grooming: Wash/dry hands;Supervision/safety;Standing Grooming Details (indicate cue type and reason): reviewed appropriate compensatory technique for oral care as well, but pt has already performed prior to session Upper Body Bathing: Set up;Sitting   Lower Body Bathing: Supervison/ safety;Sit to/from stand   Upper Body Dressing : Set up;Sitting   Lower Body Dressing: Supervision/safety;Sit to/from stand   Toilet Transfer: Supervision/safety;Ambulation;Rolling walker (2 wheels)   Toileting- Clothing Manipulation and Hygiene: Set up;Sitting/lateral lean       Functional mobility during ADLs: Supervision/safety;Rolling walker (2 wheels)       Vision Baseline Vision/History: 0 No visual deficits Ability to See in Adequate Light: 0 Adequate Patient  Visual Report: No change from  baseline Vision Assessment?: No apparent visual deficits     Perception Perception Perception Tested?: No   Praxis Praxis Praxis tested?: Within functional limits    Pertinent Vitals/Pain Pain Assessment Pain Assessment: 0-10 Pain Score: 3  Pain Location: L elbow Pain Descriptors / Indicators: Sore Pain Intervention(s): Limited activity within patient's tolerance, Monitored during session     Hand Dominance Right   Extremity/Trunk Assessment Upper Extremity Assessment Upper Extremity Assessment: Generalized weakness   Lower Extremity Assessment Lower Extremity Assessment: Generalized weakness   Cervical / Trunk Assessment Cervical / Trunk Assessment: Neck Surgery   Communication Communication Communication: No difficulties   Cognition Arousal/Alertness: Awake/alert Behavior During Therapy: WFL for tasks assessed/performed Overall Cognitive Status: Within Functional Limits for tasks assessed                                       General Comments  daughter, Faith Gaines present    Exercises     Shoulder Instructions      Home Living Family/patient expects to be discharged to:: Private residence Living Arrangements: Alone Available Help at Discharge: Family;Available 24 hours/day Type of Home: House Home Access: Stairs to enter [employer] of Steps: 2 Entrance Stairs-Rails: None Home Layout: Two level;Able to live on main level with bedroom/bathroom     Bathroom Shower/Tub: Occupational psychologist: Standard     Home Equipment: Conservation officer, nature (2 wheels)          Prior Functioning/Environment Prior Level of Function : Independent/Modified Independent;Driving             Mobility Comments: active in church          OT Problem List: Decreased strength;Impaired balance (sitting and/or standing);Decreased activity tolerance;Decreased knowledge of use of DME or AE;Decreased knowledge of precautions;Pain;Decreased  safety awareness      OT Treatment/Interventions: Self-care/ADL training;Therapeutic exercise;DME and/or AE instruction;Therapeutic activities;Patient/family education;Balance training    OT Goals(Current goals can be found in the care plan section) Acute Rehab OT Goals Patient Stated Goal: go home OT Goal Formulation: With patient Time For Goal Achievement: 09/20/22 Potential to Achieve Goals: Good  OT Frequency: Min 2X/week    Co-evaluation              AM-PAC OT "6 Clicks" Daily Activity     Outcome Measure Help from another person eating meals?: None Help from another person taking care of personal grooming?: A Little Help from another person toileting, which includes using toliet, bedpan, or urinal?: A Little Help from another person bathing (including washing, rinsing, drying)?: A Little Help from another person to put on and taking off regular upper body clothing?: A Little Help from another person to put on and taking off regular lower body clothing?: A Little 6 Click Score: 19   End of Session Equipment Utilized During Treatment: Gait belt;Rolling walker (2 wheels) Nurse Communication: Mobility status  Activity Tolerance: Patient tolerated treatment well Patient left: in bed;with call bell/phone within reach;with family/visitor present  OT Visit Diagnosis: Unsteadiness on feet (R26.81);Muscle weakness (generalized) (M62.81);Other abnormalities of gait and mobility (R26.89)                Time: 1349-1411 OT Time Calculation (min): 22 min Charges:  OT General Charges $OT Visit: 1 Visit OT Evaluation $OT Eval Low Complexity: 1 Low  Magnus Ivan, OTD, OTR/L [place] Acute Rehabilitation Office: (  [phone]   Magnus Ivan 09/06/2022, 4:39 PM

## 2022-09-07 ENCOUNTER — Inpatient Hospital Stay (HOSPITAL_COMMUNITY): Payer: Medicare Other

## 2022-09-07 DIAGNOSIS — E1110 Type 2 diabetes mellitus with ketoacidosis without coma: Secondary | ICD-10-CM | POA: Diagnosis not present

## 2022-09-07 DIAGNOSIS — N179 Acute kidney failure, unspecified: Secondary | ICD-10-CM | POA: Diagnosis not present

## 2022-09-07 DIAGNOSIS — K219 Gastro-esophageal reflux disease without esophagitis: Secondary | ICD-10-CM | POA: Diagnosis not present

## 2022-09-07 DIAGNOSIS — I2510 Atherosclerotic heart disease of native coronary artery without angina pectoris: Secondary | ICD-10-CM | POA: Diagnosis not present

## 2022-09-07 LAB — BASIC METABOLIC PANEL
Anion gap: 9 (ref 5–15)
BUN: <5 mg/dL — ABNORMAL LOW (ref 8–23)
CO2: 26 mmol/L (ref 22–32)
Calcium: 9.7 mg/dL (ref 8.9–10.3)
Chloride: 106 mmol/L (ref 98–111)
Creatinine, Ser: 0.6 mg/dL (ref 0.44–1.00)
GFR, Estimated: >60 mL/min (ref >60)
Glucose, Bld: 95 mg/dL (ref 70–99)
Potassium: 3.6 mmol/L (ref 3.5–5.1)
Sodium: 141 mmol/L (ref 135–145)

## 2022-09-07 LAB — MAGNESIUM: Magnesium: 2.1 mg/dL (ref 1.7–2.4)

## 2022-09-07 LAB — GLUCOSE, CAPILLARY
Glucose-Capillary: 135 mg/dL — ABNORMAL HIGH (ref 70–99)
Glucose-Capillary: 124 mg/dL — ABNORMAL HIGH (ref 70–99)

## 2022-09-07 NOTE — Progress Notes (Signed)
Physical Therapy Treatment Patient Details Name: Faith Gaines MRN: [id number] DOB: 1946-02-02 Today's Date: 09/07/2022   History of Present Illness Patient is a 77 y.o.  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    PT Comments    Pt received in supine, agreeable to therapy session with emphasis on stair training, compliance with cervical precautions (handouts given on this and HEP to reinforce) and safety with gait/transfers. Pt unsteady without AD and with multiple episodes of scissoring gait pattern, she was steadier using RW than no AD. Pt reluctant to use RW so recommend she use cane (pt states she has one) upon DC for fall risk prevention and continue working with PT at home to progress balance/strength. Pt needing up to modA for stair ascent/descent x4 steps with HHA (no railings at her home). Pt also given gait belt for family to assist her with stairs/transfers. Heavy emphasis on cervical precautions as pt frequently attempting to twist/rotate her neck when speaking to family/staff members. Pt continues to benefit from PT services to progress toward functional mobility goals.    Recommendations for follow up therapy are one component of a multi-disciplinary discharge planning process, led by the attending physician.  Recommendations may be updated based on patient status, additional functional criteria and insurance authorization.  Follow Up Recommendations  Home health PT     Assistance Recommended at Discharge Intermittent Supervision/Assistance  Patient can return home with the following A little help with walking and/or transfers;Assist for transportation;Help with stairs or ramp for entrance;A little help with bathing/dressing/bathroom;Assistance  with cooking/housework   Equipment Recommendations  None recommended by PT (PTA recommend she use a cane, she states she has one at home already)    Recommendations for Other Services       Precautions / Restrictions Precautions Precautions: Fall;Cervical Precaution Booklet Issued: Yes (comment) Required Braces or Orthoses: Cervical Brace Cervical Brace: Hard collar;At all times Restrictions Weight Bearing Restrictions: No     Mobility  Bed Mobility Overal bed mobility: Needs Assistance Bed Mobility: Rolling, Supine to Sit, Sit to Supine Rolling: Supervision   Supine to sit: Supervision, HOB elevated Sit to supine: Supervision, HOB elevated   General bed mobility comments: pt received with HOB very elevated; pt cued for log roll, but tends to long sit then rotate hips around.    Transfers Overall transfer level: Needs assistance Equipment used: Rolling walker (2 wheels), None Transfers: Sit to/from Stand Sit to Stand: Supervision, Min guard           General transfer comment: supervision for safety to RW, min guard without AD due to mild instability    Ambulation/Gait Ambulation/Gait assistance: Min guard, Min assist Gait Distance (Feet): 150 Feet Assistive device: Rolling walker (2 wheels), None Gait Pattern/deviations: Step-through pattern, Decreased stride length, Narrow base of support, Scissoring       General Gait Details: initial LOB to R side standing at bedside, PTA assist with gait belt to recover, then pt used RW intermittently ~36f on way to stairs, then ~354fback, otherwise without AD but needed x3 total episodes of external assist due to scissoring steps/lateral loss of balance. Mod cues for wider BOS and to avoid twisting her neck. HCLohrvilledjusted tighter at end of session.   Stairs Stairs: Yes Stairs assistance: Min assist, Mod assist, +  2 safety/equipment Stair Management: No rails, Step to pattern, Forwards (HHA) Number of Stairs: 4 General  stair comments: cues for step sequencing and safety, pt given gait belt to take home to ensure safety given that she needed up to [facility] for stability ascending per home set-up without rails. handout also given for pt for stairs with cane since she has a cane at home she can use.   Wheelchair Mobility    Modified Rankin (Stroke Patients Only)       Balance Overall balance assessment: Needs assistance Sitting-balance support: Feet supported Sitting balance-Leahy Scale: Fair     Standing balance support: During functional activity, No upper extremity supported Standing balance-Leahy Scale: Poor Standing balance comment: poor without AD, fair with RW                            Cognition Arousal/Alertness: Awake/alert Behavior During Therapy: WFL for tasks assessed/performed Overall Cognitive Status: Within Functional Limits for tasks assessed                                 General Comments: Pt with poor recall of "no twisting" precaution then when asked what the cervical precs are for her post-op, she was unable to state them. Handout printed to reinforce with pt and her son was also receptive to the instruction. Also printed supine/standing LE HEP for her and stair sequencing instructions with cane since she has a cane at home she plans to use PRN.        Exercises Other Exercises Other Exercises: standing HEP handout printed (hip flexion, mini squats, hip abd) and supine SLR and SAQ, freq written on handout    General Comments General comments (skin integrity, edema, etc.): son [name] present during session      Pertinent Vitals/Pain Pain Assessment Pain Assessment: No/denies pain Pain Intervention(s): Monitored during session     PT Goals (current goals can now be found in the care plan section) Acute Rehab PT Goals Patient Stated Goal: return to independent PT Goal Formulation: With patient/family Time For Goal Achievement: 09/19/22 Progress  towards PT goals: Progressing toward goals    Frequency    Min 3X/week      PT Plan Current plan remains appropriate       AM-PAC PT "6 Clicks" Mobility   Outcome Measure  Help needed turning from your back to your side while in a flat bed without using bedrails?: None Help needed moving from lying on your back to sitting on the side of a flat bed without using bedrails?: A Little Help needed moving to and from a bed to a chair (including a wheelchair)?: A Little Help needed standing up from a chair using your arms (e.g., wheelchair or bedside chair)?: A Little Help needed to walk in hospital room?: A Little Help needed climbing 3-5 steps with a railing? : A Lot 6 Click Score: 18    End of Session Equipment Utilized During Treatment: Gait belt;Cervical collar Activity Tolerance: Patient tolerated treatment well Patient left: in bed;with call bell/phone within reach;with nursing/sitter in room (son present, pt awaiting DC) Nurse Communication: Mobility status;Other (comment) (pt unsure if the RW in the room is for her or not (none on order, pt has cane at home she can use)) PT Visit Diagnosis: Muscle weakness (generalized) (M62.81);Difficulty in walking, not elsewhere classified (R26.2)     Time: [id number] PT  Time Calculation (min) (ACUTE ONLY): 14 min  Charges:  $Gait Training: 8-22 mins                     Berdia Lachman P., PTA Acute Rehabilitation Services Secure Chat Preferred 9a-5:30pm Office: [place] 09/07/2022, 2:47 PM

## 2022-09-07 NOTE — Plan of Care (Signed)

## 2022-09-07 NOTE — Discharge Planning (Signed)
PATIENT DETAILS Name: Faith Gaines Age: 77 y.o. Sex: female Date of Birth: August 31, 1945 MRN: [id number]. Admitting Physician: Jonetta Osgood, MD [id number], Elyse Jarvis, MD  Admit Date: 09/04/2022 Discharge date: 09/07/2022  Recommendations for Outpatient Follow-up:  Follow up with PCP in 1-2 weeks Please obtain CMP/CBC in one week Outpatient MRI to evaluate left kidney lesion  Admitted From:  Home  Disposition: Home health   Discharge Condition: good  CODE STATUS:   Code Status: Full Code   Diet recommendation:  Diet Order             Diet - low sodium heart healthy           Diet heart healthy/carb modified Fluid consistency: Thin  Diet effective now                    Brief Summary: Patient is a 77 y.o.  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   Significant events: 2/16>> admit to [facility]   Significant studies: 2/16>> nodular opacity in the right midlung.   Significant microbiology data: 2/16>>COVID/influenza/RSV PCR: Negative   Procedures: None   Consults: None  Brief Hospital Course: Diabetic ketoacidosis Sugars were never very elevated-some concern that this may be euglycemic DKA due to canagliflozin use She was managed with IV insulin/IV fluids and once anion gap closed she was transitioned to SQ insulin No plans to resume Invokana on discharge--see below.   AKI Hemodynamically mediated Resolved with IVF/resolution of DKA (Does not appear to have CKD stage IIIb-ruled out)   Hypokalemia Repleted   Hypomagnesemia Repleted   Recent ACDF Cervical collar in place PT/OT eval completed-recommendations are for home health Some mild history of facial  discomfort persist-she was told by her orthopedic surgeon that this is related to cervical surgery.   Nausea/Vomiting Resolved-debility DKA Waiting diet-history of facial discomfort persist-as noted above this is likely secondary to her recent cervical spine surgery.   DM-2 (A1C 7.8 on 2/1) CBG stable on SQ insulin Continue metformin and Ozempic on discharge No plans to resume Invokana on discharge-PCP to evaluate and decide on follow-up.   As noted above-some concern that she may have had euglycemic DKA due to [place].    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 Resume Repatha postdischarge Continue gabapentin   PAD Carotid artery disease Hyperlipidemia c/w ASA On Repatha outpatient   Hypertension BP stable on Bystolic.   GERD  Continue home PPI and Pepcid   History of breast cancer status postlumpectomy Resume outpatient follow-up with GI   Abnormal chest x-ray Nodular opacity noted on chest x-ray on admission    CT chest on 2/19-no nodule seen  Left Kidney lesion Needs outpatient MRI as recommended by radiology   Debility/deconditioning Due to acute illness-recent ACDF PT/OT recommend home health   BMI Estimated body mass index is 20.03 kg/m as calculated from the following:   Height as of this encounter: 5' 1"$  (1.549 m).   Weight as of this encounter: 48.1 kg   Discharge Diagnoses:  Principal Problem:   DKA (diabetic ketoacidosis) ([place]) Active Problems:   Breast cancer of upper-outer quadrant of left female breast, Tis, 3 oclock   Carotid artery disease ([place])   Hyperlipidemia  Hypertension   Peripheral vascular disease (HCC)   GERD (gastroesophageal reflux disease)   DM (diabetes mellitus), type 2 with peripheral vascular complications (HCC)   S/P CABG x 3   Coronary artery disease involving native heart without angina pectoris   Leukocytosis   Acute renal failure superimposed on stage 3b chronic  kidney disease [facility])   Discharge Instructions:  Activity:  As tolerated with Full fall precautions use walker/cane & assistance as needed   Discharge Instructions     Call MD for:  extreme fatigue   Complete by: As directed    Call MD for:  persistant dizziness or light-headedness   Complete by: As directed    Call MD for:  persistant nausea and vomiting   Complete by: As directed    Diet - low sodium heart healthy   Complete by: As directed    Discharge instructions   Complete by: As directed    Follow with Primary MD  [name], MD in 1-2 weeks  Please get a complete blood count and chemistry panel checked by your Primary MD at your next visit, and again as instructed by your Primary MD.  Incidental Finding:left kidney lesion, outpatient non urgent MRI recommended, please talk to your PCP about this finding. In some cases, these kidney lesions can turn cancerous and hence need to be monitored by your primary care practitioner  Get Medicines reviewed and adjusted: Please take all your medications with you for your next visit with your Primary MD  Laboratory/radiological data: Please request your Primary MD to go over all hospital tests and procedure/radiological results at the follow up, please ask your Primary MD to get all Hospital records sent to his/her office.  In some cases, they will be blood work, cultures and biopsy results pending at the time of your discharge. Please request that your primary care M.D. follows up on these results.  Also Note the following: If you experience worsening of your admission symptoms, develop shortness of breath, life threatening emergency, suicidal or homicidal thoughts you must seek medical attention immediately by calling 911 or calling your MD immediately  if symptoms less severe.  You must read complete instructions/literature along with all the possible adverse reactions/side effects for all the Medicines you take and that have  been prescribed to you. Take any new Medicines after you have completely understood and accpet all the possible adverse reactions/side effects.   Do not drive when taking Pain medications or sleeping medications (Benzodaizepines)  Do not take more than prescribed Pain, Sleep and Anxiety Medications. It is not advisable to combine anxiety,sleep and pain medications without talking with your primary care practitioner  Special Instructions: If you have smoked or chewed Tobacco  in the last 2 yrs please stop smoking, stop any regular Alcohol  and or any Recreational drug use.  Wear Seat belts while driving.  Please note: You were cared for by a hospitalist during your hospital stay. Once you are discharged, your primary care physician will handle any further medical issues. Please note that NO REFILLS for any discharge medications will be authorized once you are discharged, as it is imperative that you return to your primary care physician (or establish a relationship with a primary care physician if you do not have one) for your post hospital discharge needs so that they can reassess your need for medications and monitor your lab values.   Increase activity slowly   Complete by: As directed  Allergies as of 09/07/2022       Reactions   Codeine Nausea And Vomiting, Other (See Comments)   Headaches    Ace Inhibitors Other (See Comments)   Drop in GFR   Atorvastatin    Body aches Other reaction(s): body aches Other reaction(s): body aches   Biaxin [clarithromycin] Other (See Comments)   Insomnia    Crestor [rosuvastatin] Other (See Comments)   Myalgias    Iodine Other (See Comments)   Fungus    Plaquenil [hydroxychloroquine Sulfate] Itching   Avandamet [rosiglitazone-metformin] Rash, Other (See Comments)   Hyperactivity    Betadine [povidone Iodine] Rash        Medication List     STOP taking these medications    canagliflozin 300 MG Tabs tablet Commonly known as:  INVOKANA       TAKE these medications    acetaminophen 325 MG tablet Commonly known as: TYLENOL Take 650 mg by mouth every 6 (six) hours as needed for mild pain, moderate pain, fever or headache.   aspirin EC 81 MG tablet Take 81 mg by mouth daily.   Biotin 5000 MCG Tabs Take 1,000 mcg by mouth daily.   Bystolic 20 MG Tabs Generic drug: Nebivolol HCl Take 20 mg by mouth daily.   nebivolol 5 MG tablet Commonly known as: BYSTOLIC Take 5 mg by mouth daily.   famotidine 40 MG tablet Commonly known as: PEPCID Take 40 mg by mouth at bedtime.   Fish Oil 1000 MG Caps Take 1,000 mg by mouth daily.   gabapentin 300 MG capsule Commonly known as: NEURONTIN Take 1 capsule (300 mg total) by mouth 3 (three) times daily. What changed: when to take this   HYDROcodone-acetaminophen 5-325 MG tablet Commonly known as: NORCO/VICODIN Take 1 tablet by mouth every 6 (six) hours as needed for moderate pain or severe pain.   metFORMIN 500 MG 24 hr tablet Commonly known as: GLUCOPHAGE-XR Take 500 mg by mouth at bedtime.   methocarbamol 750 MG tablet Commonly known as: ROBAXIN Take 1 tablet (750 mg total) by mouth every 6 (six) hours as needed for muscle spasms.   ondansetron 4 MG tablet Commonly known as: Zofran Take 1 tablet (4 mg total) by mouth every 8 (eight) hours as needed for nausea or vomiting.   pantoprazole 40 MG tablet Commonly known as: PROTONIX Take 40 mg by mouth daily.   PRESERVISION AREDS 2 PO Take 1 capsule by mouth daily.   promethazine 25 MG tablet Commonly known as: PHENERGAN Take 25 mg by mouth every 6 (six) hours as needed for nausea or vomiting.   Repatha SureClick [id number] MG/ML [place] Generic drug: Evolocumab Inject 140 mg into the skin every 14 (fourteen) days.   vitamin B-12 500 MCG tablet Commonly known as: CYANOCOBALAMIN Take 500 mcg by mouth daily.   Vitamin D3 50 MCG (2000 UT) Tabs Take 2,000 Units by mouth daily.        Follow-up Information      [name], MD. Schedule an appointment as soon as possible for a visit in 1 week(s).   Specialty: Family Medicine Contact information: [place] [address] [place], [facility] Follow up.   Specialty: Home Health Services Why: [facility] will provide your Home Health PT Contact information: [place] [address] [phone]                Allergies  Allergen Reactions   Codeine Nausea And Vomiting and Other (See Comments)    Headaches    Ace Inhibitors Other (See Comments)    Drop in GFR    Atorvastatin     Body aches Other reaction(s): body aches Other reaction(s): body aches   Biaxin [Clarithromycin] Other (See Comments)    Insomnia    Crestor [Rosuvastatin] Other (See Comments)    Myalgias    Iodine Other (See Comments)    Fungus    Plaquenil [Hydroxychloroquine Sulfate] Itching   Avandamet [Rosiglitazone-Metformin] Rash and Other (See Comments)    Hyperactivity    Betadine [Povidone Iodine] Rash     Other Procedures/Studies: CT CHEST WO CONTRAST  Result Date: 09/07/2022 CLINICAL DATA:  Lung nodule. EXAM: CT CHEST WITHOUT CONTRAST TECHNIQUE: Multidetector CT imaging of the chest was performed following the standard protocol without IV contrast. RADIATION DOSE REDUCTION: This exam was performed according to the departmental dose-optimization program which includes automated exposure control, adjustment of the mA and/or kV according to patient size and/or use of iterative reconstruction technique. COMPARISON:  Chest radiograph 09/04/2022 and CT chest 07/26/2020. FINDINGS: Cardiovascular: Atherosclerotic calcification of the aorta and aortic valve. Heart is at the upper limits of normal in size. No pericardial effusion. Mediastinum/Nodes: No pathologically enlarged mediastinal or axillary lymph nodes. Hilar regions are difficult to definitively evaluate without IV contrast. Esophagus is  grossly unremarkable. Lungs/Pleura: Mild cylindrical bronchiectasis. Minimal scattered parenchymal scarring. No suspicious pulmonary nodules. No pleural fluid. Airway is otherwise unremarkable. Upper Abdomen: Visualized portions of the liver, adrenal glands and right kidney are unremarkable. 12 mm lesion in the upper pole left kidney measures 27 Hounsfield units, has enlarged slightly from 07/26/2020 (9 mm) and cannot be characterized as a simple cyst. Visualized portions of the spleen, pancreas, stomach and bowel are grossly unremarkable. Cholecystectomy. No upper abdominal adenopathy. Musculoskeletal: Degenerative changes in the spine. A bone island in the anterior right third rib accounts for the abnormality on recent chest radiograph. IMPRESSION: 1. A bone island in the anterior right third rib accounts for the abnormality on recent chest radiograph. No suspicious pulmonary nodules. 2. 12 mm minimally hyperdense lesion in the left kidney has enlarged slightly from 07/26/2020 and cannot be characterized as a simple cyst. Further evaluation with pre and post contrast MRI should be considered. Pre and post contrast CT could alternatively be performed, but would likely be of decreased accuracy given lesion size. 3. Mild cylindrical bronchiectasis. 4.  Aortic atherosclerosis (ICD10-I70.0). Electronically Signed   By: Lorin Picket M.D.   On: 09/07/2022 14:41   DG Chest Port 1 View  Result Date: 09/04/2022 CLINICAL DATA:  SHOB EXAM: PORTABLE CHEST 1 VIEW COMPARISON:  Chest x-ray September 02, 2022. FINDINGS: Mild subsegmental left basilar streaky opacities. Nodular opacity in the right midlung. Cardiomediastinal silhouette is within normal limits. CABG and median sternotomy. ACDF. Cholecystectomy clips. IMPRESSION: 1. Nodular opacity in the right midlung. Recommend chest CT (preferably contrast) to further evaluate and exclude pulmonary nodule. 2. Mild subsegmental left basilar atelectasis. Electronically Signed    By: Margaretha Sheffield M.D.   On: 09/04/2022 10:04   DG Cervical Spine 1 View  Result Date: 08/27/2022 CLINICAL DATA:  Cervical fusion EXAM: DG CERVICAL SPINE - 1 VIEW COMPARISON:  None Available. FINDINGS: Two fluoroscopic images obtained during cervical spinal fusion. 16 seconds of fluoro time utilized. Radiation dose 2.03 mGy Kerma. Please see performing physicians operative report for full details IMPRESSION: Fluoroscopic images were obtained for intraoperative guidance of cervical spinal  fusion. Electronically Signed   By: Darrin Nipper M.D.   On: 08/27/2022 13:49   DG C-Arm 1-60 Min-No Report  Result Date: 08/27/2022 Fluoroscopy was utilized by the requesting physician.  No radiographic interpretation.   DG C-Arm 1-60 Min-No Report  Result Date: 08/27/2022 Fluoroscopy was utilized by the requesting physician.  No radiographic interpretation.   DG C-Arm 1-60 Min-No Report  Result Date: 08/27/2022 Fluoroscopy was utilized by the requesting physician.  No radiographic interpretation.     TODAY-DAY OF DISCHARGE:  Subjective:   Faith Gaines today has no headache,no chest abdominal pain,no new weakness tingling or numbness, feels much better wants to go home today.   Objective:   Blood pressure 129/69, pulse 70, temperature 98.2 F (36.8 C), temperature source Oral, resp. rate 19, height 5' 1"$  (1.549 m), weight 48.1 kg, SpO2 100 %.  Intake/Output Summary (Last 24 hours) at 09/07/2022 1455 Last data filed at 09/06/2022 1601 Gross per 24 hour  Intake 100 ml  Output --  Net 100 ml   Filed Weights   09/04/22 0930 09/04/22 1617  Weight: 48.1 kg 48.1 kg    Exam: Awake Alert, Oriented *3, No new F.N deficits, Normal affect [place].AT,PERRAL Supple Neck,No JVD, No cervical lymphadenopathy appriciated.  Symmetrical Chest wall movement, Good air movement bilaterally, CTAB RRR,No Gallops,Rubs or new Murmurs, No Parasternal Heave +ve B.Sounds, Abd Soft, Non tender, No organomegaly appriciated,  No rebound -guarding or rigidity. No Cyanosis, Clubbing or edema, No new Rash or bruise   PERTINENT RADIOLOGIC STUDIES: CT CHEST WO CONTRAST  Result Date: 09/07/2022 CLINICAL DATA:  Lung nodule. EXAM: CT CHEST WITHOUT CONTRAST TECHNIQUE: Multidetector CT imaging of the chest was performed following the standard protocol without IV contrast. RADIATION DOSE REDUCTION: This exam was performed according to the departmental dose-optimization program which includes automated exposure control, adjustment of the mA and/or kV according to patient size and/or use of iterative reconstruction technique. COMPARISON:  Chest radiograph 09/04/2022 and CT chest 07/26/2020. FINDINGS: Cardiovascular: Atherosclerotic calcification of the aorta and aortic valve. Heart is at the upper limits of normal in size. No pericardial effusion. Mediastinum/Nodes: No pathologically enlarged mediastinal or axillary lymph nodes. Hilar regions are difficult to definitively evaluate without IV contrast. Esophagus is grossly unremarkable. Lungs/Pleura: Mild cylindrical bronchiectasis. Minimal scattered parenchymal scarring. No suspicious pulmonary nodules. No pleural fluid. Airway is otherwise unremarkable. Upper Abdomen: Visualized portions of the liver, adrenal glands and right kidney are unremarkable. 12 mm lesion in the upper pole left kidney measures 27 Hounsfield units, has enlarged slightly from 07/26/2020 (9 mm) and cannot be characterized as a simple cyst. Visualized portions of the spleen, pancreas, stomach and bowel are grossly unremarkable. Cholecystectomy. No upper abdominal adenopathy. Musculoskeletal: Degenerative changes in the spine. A bone island in the anterior right third rib accounts for the abnormality on recent chest radiograph. IMPRESSION: 1. A bone island in the anterior right third rib accounts for the abnormality on recent chest radiograph. No suspicious pulmonary nodules. 2. 12 mm minimally hyperdense lesion in the  left kidney has enlarged slightly from 07/26/2020 and cannot be characterized as a simple cyst. Further evaluation with pre and post contrast MRI should be considered. Pre and post contrast CT could alternatively be performed, but would likely be of decreased accuracy given lesion size. 3. Mild cylindrical bronchiectasis. 4.  Aortic atherosclerosis (ICD10-I70.0). Electronically Signed   By: Lorin Picket M.D.   On: 09/07/2022 14:41     PERTINENT LAB RESULTS: CBC: Recent Labs    09/05/22  0352  WBC 6.1  HGB 12.2  HCT 36.5  PLT 271   CMET CMP     Component Value Date/Time   NA 141 09/07/2022 0431   NA 141 02/04/2021 0000   K 3.6 09/07/2022 0431   CL 106 09/07/2022 0431   CO2 26 09/07/2022 0431   GLUCOSE 95 09/07/2022 0431   BUN <5 (L) 09/07/2022 0431   BUN 16 02/04/2021 0000   CREATININE 0.60 09/07/2022 0431   CREATININE 1.08 (H) 05/27/2022 0947   CALCIUM 9.7 09/07/2022 0431   PROT 7.5 09/04/2022 0938   PROT 6.8 03/11/2021 0815   ALBUMIN 3.7 09/04/2022 0938   ALBUMIN 4.7 03/11/2021 0815   AST 23 09/04/2022 0938   AST 14 (L) 05/27/2022 0947   ALT 13 09/04/2022 0938   ALT 13 05/27/2022 0947   ALKPHOS 93 09/04/2022 0938   BILITOT 2.0 (H) 09/04/2022 0938   BILITOT 0.6 05/27/2022 0947   GFRNONAA >60 09/07/2022 0431   GFRNONAA 54 (L) 05/27/2022 0947   GFRAA >60 02/04/2020 0351    GFR Estimated Creatinine Clearance: 46.6 mL/min (by C-G formula based on SCr of 0.6 mg/dL). No results for input(s): "LIPASE", "AMYLASE" in the last 72 hours. No results for input(s): "CKTOTAL", "CKMB", "CKMBINDEX", "TROPONINI" in the last 72 hours. Invalid input(s): "POCBNP" No results for input(s): "DDIMER" in the last 72 hours. No results for input(s): "HGBA1C" in the last 72 hours. No results for input(s): "CHOL", "HDL", "LDLCALC", "TRIG", "CHOLHDL", "LDLDIRECT" in the last 72 hours. No results for input(s): "TSH", "T4TOTAL", "T3FREE", "THYROIDAB" in the last 72 hours.  Invalid input(s):  "FREET3" No results for input(s): "VITAMINB12", "FOLATE", "FERRITIN", "TIBC", "IRON", "RETICCTPCT" in the last 72 hours. Coags: No results for input(s): "INR" in the last 72 hours.  Invalid input(s): "PT" Microbiology: Recent Results (from the past 240 hour(s))  Resp panel by RT-PCR (RSV, Flu A&B, Covid) Anterior Nasal Swab     Status: None   Collection Time: 09/04/22 11:16 AM   Specimen: Anterior Nasal Swab  Result Value Ref Range Status   SARS Coronavirus 2 by RT PCR NEGATIVE NEGATIVE Final   Influenza A by PCR NEGATIVE NEGATIVE Final   Influenza B by PCR NEGATIVE NEGATIVE Final    Comment: (NOTE) The Xpert Xpress SARS-CoV-2/FLU/RSV plus assay is intended as an aid in the diagnosis of influenza from Nasopharyngeal swab specimens and should not be used as a sole basis for treatment. Nasal washings and aspirates are unacceptable for Xpert Xpress SARS-CoV-2/FLU/RSV testing.  Fact Sheet for Patients: EntrepreneurPulse.com.au  Fact Sheet for Healthcare Providers: IncredibleEmployment.be  This test is not yet approved or cleared by the [place] FDA and has been authorized for detection and/or diagnosis of SARS-CoV-2 by FDA under an Emergency Use Authorization (EUA). This EUA will remain in effect (meaning this test can be used) for the duration of the COVID-19 declaration under Section 564(b)(1) of the Act, 21 U.S.C. section 360bbb-3(b)(1), unless the authorization is terminated or revoked.     Resp Syncytial Virus by PCR NEGATIVE NEGATIVE Final    Comment: (NOTE) Fact Sheet for Patients: EntrepreneurPulse.com.au  Fact Sheet for Healthcare Providers: IncredibleEmployment.be  This test is not yet approved or cleared by the [place] FDA and has been authorized for detection and/or diagnosis of SARS-CoV-2 by FDA under an Emergency Use Authorization (EUA). This EUA will remain in effect (meaning  this test can be used) for the duration of the COVID-19 declaration under Section 564(b)(1) of the Act, 21 U.S.C. section 360bbb-3(b)(1),  unless the authorization is terminated or revoked.  Performed at [facility], [place] [address], [place], [place] [address]     FURTHER DISCHARGE INSTRUCTIONS:  Get Medicines reviewed and adjusted: Please take all your medications with you for your next visit with your Primary MD  Laboratory/radiological data: Please request your Primary MD to go over all hospital tests and procedure/radiological results at the follow up, please ask your Primary MD to get all Hospital records sent to his/her office.  In some cases, they will be blood work, cultures and biopsy results pending at the time of your discharge. Please request that your primary care M.D. goes through all the records of your hospital data and follows up on these results.  Also Note the following: If you experience worsening of your admission symptoms, develop shortness of breath, life threatening emergency, suicidal or homicidal thoughts you must seek medical attention immediately by calling 911 or calling your MD immediately  if symptoms less severe.  You must read complete instructions/literature along with all the possible adverse reactions/side effects for all the Medicines you take and that have been prescribed to you. Take any new Medicines after you have completely understood and accpet all the possible adverse reactions/side effects.   Do not drive when taking Pain medications or sleeping medications (Benzodaizepines)  Do not take more than prescribed Pain, Sleep and Anxiety Medications. It is not advisable to combine anxiety,sleep and pain medications without talking with your primary care practitioner  Special Instructions: If you have smoked or chewed Tobacco  in the last 2 yrs please stop smoking, stop any regular Alcohol  and or any Recreational drug use.  Wear Seat belts  while driving.  Please note: You were cared for by a hospitalist during your hospital stay. Once you are discharged, your primary care physician will handle any further medical issues. Please note that NO REFILLS for any discharge medications will be authorized once you are discharged, as it is imperative that you return to your primary care physician (or establish a relationship with a primary care physician if you do not have one) for your post hospital discharge needs so that they can reassess your need for medications and monitor your lab values.  Total Time spent coordinating discharge including counseling, education and face to face time equals greater than 30 minutes.  SignedOren Binet 09/07/2022 2:55 PM

## 2022-09-07 NOTE — TOC Initial Note (Signed)
Transition of Care [facility]) - Initial/Assessment Note    Patient Details  Name: Faith Gaines MRN: [id number] Date of Birth: 1945-10-12  Transition of [facility]) CM/SW Contact:    Levonne Lapping, RN Phone Number: 09/07/2022, 11:59 AM  Clinical Narrative:      CM met with patient bedside. Patient is waiting to be taken for CT of lung nodule and then will probably DC to home later today. PT is recommending Home Health. Patient has chosen [facility]. Referral made to liaison. Son [name] will transport to home art DC.  Patient states she lives alone but has a very supportive family- Adult children and Sisters. She is a bit nervous about the CT. Stated that she has followed in her Mothers footsteps in that they both had neck issues, breast issues and that her mom's breast cancer "ended up in her lungs". She is prayerful that this won't be the case for her. TOC will continue to follow patient for any additional discharge needs                Expected Discharge Plan: [place] Barriers to Discharge: Continued Medical Work up   Patient Goals and CMS Choice Patient states their goals for this hospitalization and ongoing recovery are:: HAve everything be ok with the CT today CMS Medicare.gov Compare Post Acute Care list provided to:: Patient Choice offered to / list presented to : Patient      Expected Discharge Plan and Services In-house Referral: NA Discharge Planning Services: CM Consult Post Acute Care Choice: Home Health [facility]) Living arrangements for the past 2 months: Single Family Home                 DME Arranged: N/A (Patient has all DME needed)         HH Arranged: PT HH Agency: [facility] Date [place]: 09/07/22 Time [place] Agency Contacted: [phone] Representative spoke with at [place]: [name]  Prior Living Arrangements/Services Living arrangements for the past 2 months: [place] Lives with:: Self Patient  language and need for interpreter reviewed:: Yes Do you feel safe going back to the place where you live?: Yes      Need for Family Participation in Patient Care: No (Comment) Care giver support system in place?: Yes (comment) Current home services: DME Criminal Activity/Legal Involvement Pertinent to Current Situation/Hospitalization: No - Comment as needed  Activities of Daily Living Home Assistive Devices/Equipment: Brace (specify type) (neck brace from recent fusion surgery) ADL Screening (condition at time of admission) Patient's cognitive ability adequate to safely complete daily activities?: Yes Is the patient deaf or have difficulty hearing?: No Does the patient have difficulty seeing, even when wearing glasses/contacts?: No Does the patient have difficulty concentrating, remembering, or making decisions?: No Patient able to express need for assistance with ADLs?: Yes Does the patient have difficulty dressing or bathing?: Yes (recent neck surgery) Independently performs ADLs?: No (recent neck surgery) Communication: Independent Dressing (OT): Needs assistance Is this a change from baseline?: Pre-admission baseline (recent neck surgery) Grooming: Needs assistance Is this a change from baseline?: Pre-admission baseline Feeding: Needs assistance Is this a change from baseline?: Pre-admission baseline Bathing: Needs assistance Is this a change from baseline?: Pre-admission baseline Toileting: Needs assistance Is this a change from baseline?: Pre-admission baseline In/Out Bed: Needs assistance Is this a change from baseline?: Pre-admission baseline Walks in Home: Needs assistance Is this a change from baseline?: Pre-admission baseline Does the  patient have difficulty walking or climbing stairs?: Yes Weakness of Legs: Both Weakness of Arms/Hands: Both  Permission Sought/Granted   Permission granted to share information with : Yes, Verbal Permission Granted     Permission  granted to share info w AGENCY: Home Health        Emotional Assessment Appearance:: Appears stated age Attitude/Demeanor/Rapport: Engaged, Gracious Affect (typically observed): Hopeful, Appropriate Orientation: : Oriented to Self, Oriented to Place, Oriented to  Time, Oriented to Situation Alcohol / Substance Use: Not Applicable Psych Involvement: No (comment)  Admission diagnosis:  DKA (diabetic ketoacidosis) ([place]) [E11.10] AKI (acute kidney injury) ([place]) [N17.9] Diabetic ketoacidosis without coma associated with type 2 diabetes mellitus ([place]) [E11.10] Patient Active Problem List   Diagnosis Date Noted   DKA (diabetic ketoacidosis) ([place]) 09/04/2022   Leukocytosis 09/04/2022   Acute renal failure superimposed on stage 3b chronic kidney disease ([place]) 09/04/2022   Myelopathy ([place]) 08/27/2022   Hypercalcemia 05/27/2022   Polycythemia, 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    Class: Chronic   Coronary artery disease involving native heart without angina pectoris 05/11/2021   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   Unstable angina (HCC) 01/26/2020   Abnormal cardiac CT angiography 01/26/2020   Educated about COVID-19 virus infection 12/26/2019   Primary osteoarthritis of both hands 01/30/2019   Prolapse of uterus 10/22/2014   Iron deficiency anemia due to chronic blood loss    History of melanoma excision 01/16/2014   DM (diabetes mellitus), type 2 with peripheral vascular complications ([place]) 123456   Dyspnea 01/16/2014   Hyperlipidemia    Hypertension    Osteopenia    Peripheral vascular disease (HCC)    GERD (gastroesophageal reflux disease)    Diarrhea    Headache(784.0) 08/01/2013   Carotid artery disease ([place]) 06/29/2013   Breast cancer of upper-outer quadrant of left female breast, Tis, 3 oclock 06/23/2013   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Phone: [phone]: [phone] [address] [address]Phone: [phone]: [phone]    Social Determinants of Health (SDOH) Social History: [place] No Food Insecurity (09/04/2022)  Housing: Low Risk  (09/04/2022)  Transportation Needs: No Transportation Needs (09/04/2022)  Utilities: Not At Risk (09/04/2022)  Tobacco Use: Medium Risk (09/04/2022)   SDOH Interventions:     Readmission Risk Interventions     No data to display

## 2022-09-07 NOTE — TOC Transition Note (Signed)
Transition of Care [facility]) - CM/SW Discharge Note   Patient Details  Name: Faith Gaines MRN: [id number] Date of Birth: [birth year]  Transition of [facility]) CM/SW Contact:  Levonne Lapping, RN Phone Number: 09/07/2022, 4:00 PM   Clinical Narrative:    Dcing to home today- Son will transport. Home Health PT ordered and will be provided by [facility] . No DME needed  No additional  TOC needs       Barriers to Discharge: Continued Medical Work up   Patient Goals and CMS Choice CMS Medicare.gov Compare Post Acute Care list provided to:: Patient Choice offered to / list presented to : Patient  Discharge Placement                         Discharge Plan and Services Additional resources added to the After Visit Summary for   In-house Referral: NA Discharge Planning Services: CM Consult Post Acute Care Choice: Home Health [facility])          DME Arranged: N/A (Patient has all DME needed)         HH Arranged: PT HH Agency: [facility] Date [place]: 09/07/22 Time [place]: [id number] Representative spoke with at [place]: [name]  Social Determinants of Health ([place]) Interventions SDOH Screenings   Food Insecurity: No Food Insecurity (09/04/2022)  Housing: Low Risk  (09/04/2022)  Transportation Needs: No Transportation Needs (09/04/2022)  Utilities: Not At Risk (09/04/2022)  Tobacco Use: Medium Risk (09/04/2022)     Readmission Risk Interventions     No data to display

## 2022-09-08 NOTE — Discharge Summary (Signed)
PATIENT DETAILS Name: Faith Gaines Age: 77 y.o. Sex: female Date of Birth: 1946/01/08 MRN: [id number]. Admitting Physician: Jonetta Osgood, MD [id number], Elyse Jarvis, MD  Admit Date: 09/04/2022 Discharge date: 09/08/2022  Recommendations for Outpatient Follow-up:  Follow up with PCP in 1-2 weeks Please obtain CMP/CBC in one week Outpatient MRI to evaluate left kidney lesion  Admitted From:  Home  Disposition: Home health   Discharge Condition: good  CODE STATUS:   Code Status: Prior   Diet recommendation:  Diet Order             Diet - low sodium heart healthy                    Brief Summary: Patient is a 77 y.o.  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   Significant events: 2/16>> admit to [facility]   Significant studies: 2/16>> nodular opacity in the right midlung.   Significant microbiology data: 2/16>>COVID/influenza/RSV PCR: Negative   Procedures: None   Consults: None  Brief Hospital Course: Diabetic ketoacidosis Sugars were never very elevated-some concern that this may be euglycemic DKA due to canagliflozin use She was managed with IV insulin/IV fluids and once anion gap closed she was transitioned to SQ insulin No plans to resume Invokana on discharge--see below.   AKI Hemodynamically mediated Resolved with IVF/resolution of DKA (Does not appear to have CKD stage IIIb-ruled out)   Hypokalemia Repleted   Hypomagnesemia Repleted   Recent ACDF Cervical collar in place PT/OT eval completed-recommendations are for home health Some mild history of facial discomfort persist-she was told by her orthopedic surgeon that this is related to cervical  surgery.   Nausea/Vomiting Resolved-debility DKA Waiting diet-history of facial discomfort persist-as noted above this is likely secondary to her recent cervical spine surgery.   DM-2 (A1C 7.8 on 2/1) CBG stable on SQ insulin Continue metformin and Ozempic on discharge No plans to resume Invokana on discharge-PCP to evaluate and decide on follow-up.   As noted above-some concern that she may have had euglycemic DKA due to [place].    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 Resume Repatha postdischarge Continue gabapentin   PAD Carotid artery disease Hyperlipidemia c/w ASA On Repatha outpatient   Hypertension BP stable on Bystolic.   GERD  Continue home PPI and Pepcid   History of breast cancer status postlumpectomy Resume outpatient follow-up with GI   Abnormal chest x-ray Nodular opacity noted on chest x-ray on admission    CT chest on 2/19-no nodule seen  Left Kidney lesion Needs outpatient MRI as recommended by radiology   Debility/deconditioning Due to acute illness-recent ACDF PT/OT recommend home health   BMI Estimated body mass index is 20.03 kg/m as calculated from the following:   Height as of this encounter: 5' 1"$  (1.549 m).   Weight as of this encounter: 48.1 kg   Discharge Diagnoses:  Principal Problem:   DKA (diabetic ketoacidosis) ([place]) Active Problems:   Breast cancer of upper-outer quadrant of left female breast, Tis, 3 oclock   Carotid artery disease ([place])   Hyperlipidemia   Hypertension   Peripheral vascular disease ([place])   GERD (gastroesophageal reflux disease)   DM (diabetes mellitus), type 2 with  peripheral vascular complications (HCC)   S/P CABG x 3   Coronary artery disease involving native heart without angina pectoris   Leukocytosis   AKI (acute kidney injury) [facility])   Discharge Instructions:  Activity:  As tolerated with Full fall precautions use walker/cane & assistance  as needed   Discharge Instructions     Call MD for:  extreme fatigue   Complete by: As directed    Call MD for:  persistant dizziness or light-headedness   Complete by: As directed    Call MD for:  persistant nausea and vomiting   Complete by: As directed    Diet - low sodium heart healthy   Complete by: As directed    Discharge instructions   Complete by: As directed    Follow with Primary MD  [name], MD in 1-2 weeks  Please get a complete blood count and chemistry panel checked by your Primary MD at your next visit, and again as instructed by your Primary MD.  Incidental Finding:left kidney lesion, outpatient non urgent MRI recommended, please talk to your PCP about this finding. In some cases, these kidney lesions can turn cancerous and hence need to be monitored by your primary care practitioner  Get Medicines reviewed and adjusted: Please take all your medications with you for your next visit with your Primary MD  Laboratory/radiological data: Please request your Primary MD to go over all hospital tests and procedure/radiological results at the follow up, please ask your Primary MD to get all Hospital records sent to his/her office.  In some cases, they will be blood work, cultures and biopsy results pending at the time of your discharge. Please request that your primary care M.D. follows up on these results.  Also Note the following: If you experience worsening of your admission symptoms, develop shortness of breath, life threatening emergency, suicidal or homicidal thoughts you must seek medical attention immediately by calling 911 or calling your MD immediately  if symptoms less severe.  You must read complete instructions/literature along with all the possible adverse reactions/side effects for all the Medicines you take and that have been prescribed to you. Take any new Medicines after you have completely understood and accpet all the possible adverse reactions/side  effects.   Do not drive when taking Pain medications or sleeping medications (Benzodaizepines)  Do not take more than prescribed Pain, Sleep and Anxiety Medications. It is not advisable to combine anxiety,sleep and pain medications without talking with your primary care practitioner  Special Instructions: If you have smoked or chewed Tobacco  in the last 2 yrs please stop smoking, stop any regular Alcohol  and or any Recreational drug use.  Wear Seat belts while driving.  Please note: You were cared for by a hospitalist during your hospital stay. Once you are discharged, your primary care physician will handle any further medical issues. Please note that NO REFILLS for any discharge medications will be authorized once you are discharged, as it is imperative that you return to your primary care physician (or establish a relationship with a primary care physician if you do not have one) for your post hospital discharge needs so that they can reassess your need for medications and monitor your lab values.   Increase activity slowly   Complete by: As directed       Allergies as of 09/07/2022       Reactions   Codeine Nausea And Vomiting, Other (See Comments)   Headaches    Ace  Inhibitors Other (See Comments)   Drop in GFR   Atorvastatin    Body aches Other reaction(s): body aches Other reaction(s): body aches   Biaxin [clarithromycin] Other (See Comments)   Insomnia    Crestor [rosuvastatin] Other (See Comments)   Myalgias    Iodine Other (See Comments)   Fungus    Plaquenil [hydroxychloroquine Sulfate] Itching   Avandamet [rosiglitazone-metformin] Rash, Other (See Comments)   Hyperactivity    Betadine [povidone Iodine] Rash        Medication List     STOP taking these medications    canagliflozin 300 MG Tabs tablet Commonly known as: INVOKANA       TAKE these medications    acetaminophen 325 MG tablet Commonly known as: TYLENOL Take 650 mg by mouth every 6 (six)  hours as needed for mild pain, moderate pain, fever or headache.   aspirin EC 81 MG tablet Take 81 mg by mouth daily.   Biotin 5000 MCG Tabs Take 1,000 mcg by mouth daily.   Bystolic 20 MG Tabs Generic drug: Nebivolol HCl Take 20 mg by mouth daily.   nebivolol 5 MG tablet Commonly known as: BYSTOLIC Take 5 mg by mouth daily.   famotidine 40 MG tablet Commonly known as: PEPCID Take 40 mg by mouth at bedtime.   Fish Oil 1000 MG Caps Take 1,000 mg by mouth daily.   gabapentin 300 MG capsule Commonly known as: NEURONTIN Take 1 capsule (300 mg total) by mouth 3 (three) times daily. What changed: when to take this   HYDROcodone-acetaminophen 5-325 MG tablet Commonly known as: NORCO/VICODIN Take 1 tablet by mouth every 6 (six) hours as needed for moderate pain or severe pain.   metFORMIN 500 MG 24 hr tablet Commonly known as: GLUCOPHAGE-XR Take 500 mg by mouth at bedtime.   methocarbamol 750 MG tablet Commonly known as: ROBAXIN Take 1 tablet (750 mg total) by mouth every 6 (six) hours as needed for muscle spasms.   ondansetron 4 MG tablet Commonly known as: Zofran Take 1 tablet (4 mg total) by mouth every 8 (eight) hours as needed for nausea or vomiting.   pantoprazole 40 MG tablet Commonly known as: PROTONIX Take 40 mg by mouth daily.   PRESERVISION AREDS 2 PO Take 1 capsule by mouth daily.   promethazine 25 MG tablet Commonly known as: PHENERGAN Take 25 mg by mouth every 6 (six) hours as needed for nausea or vomiting.   Repatha SureClick [id number] MG/ML [place] Generic drug: Evolocumab Inject 140 mg into the skin every 14 (fourteen) days.   vitamin B-12 500 MCG tablet Commonly known as: CYANOCOBALAMIN Take 500 mcg by mouth daily.   Vitamin D3 50 MCG (2000 UT) Tabs Take 2,000 Units by mouth daily.        Follow-up Information     [name], MD. Schedule an appointment as soon as possible for a visit in 1 week(s).   Specialty: Family Medicine Contact  information: [place] [address] [place], [facility] Follow up.   Specialty: Home Health Services Why: [facility] will provide your Home Health PT Contact information: [address] [place] [address] [phone]                Allergies  Allergen Reactions   Codeine Nausea And Vomiting and Other (See Comments)    Headaches    Ace Inhibitors Other (See Comments)  Drop in GFR    Atorvastatin     Body aches Other reaction(s): body aches Other reaction(s): body aches   Biaxin [Clarithromycin] Other (See Comments)    Insomnia    Crestor [Rosuvastatin] Other (See Comments)    Myalgias    Iodine Other (See Comments)    Fungus    Plaquenil [Hydroxychloroquine Sulfate] Itching   Avandamet [Rosiglitazone-Metformin] Rash and Other (See Comments)    Hyperactivity    Betadine [Povidone Iodine] Rash     Other Procedures/Studies: CT CHEST WO CONTRAST  Result Date: 09/07/2022 CLINICAL DATA:  Lung nodule. EXAM: CT CHEST WITHOUT CONTRAST TECHNIQUE: Multidetector CT imaging of the chest was performed following the standard protocol without IV contrast. RADIATION DOSE REDUCTION: This exam was performed according to the departmental dose-optimization program which includes automated exposure control, adjustment of the mA and/or kV according to patient size and/or use of iterative reconstruction technique. COMPARISON:  Chest radiograph 09/04/2022 and CT chest 07/26/2020. FINDINGS: Cardiovascular: Atherosclerotic calcification of the aorta and aortic valve. Heart is at the upper limits of normal in size. No pericardial effusion. Mediastinum/Nodes: No pathologically enlarged mediastinal or axillary lymph nodes. Hilar regions are difficult to definitively evaluate without IV contrast. Esophagus is grossly unremarkable. Lungs/Pleura: Mild cylindrical bronchiectasis. Minimal scattered parenchymal scarring. No suspicious pulmonary  nodules. No pleural fluid. Airway is otherwise unremarkable. Upper Abdomen: Visualized portions of the liver, adrenal glands and right kidney are unremarkable. 12 mm lesion in the upper pole left kidney measures 27 Hounsfield units, has enlarged slightly from 07/26/2020 (9 mm) and cannot be characterized as a simple cyst. Visualized portions of the spleen, pancreas, stomach and bowel are grossly unremarkable. Cholecystectomy. No upper abdominal adenopathy. Musculoskeletal: Degenerative changes in the spine. A bone island in the anterior right third rib accounts for the abnormality on recent chest radiograph. IMPRESSION: 1. A bone island in the anterior right third rib accounts for the abnormality on recent chest radiograph. No suspicious pulmonary nodules. 2. 12 mm minimally hyperdense lesion in the left kidney has enlarged slightly from 07/26/2020 and cannot be characterized as a simple cyst. Further evaluation with pre and post contrast MRI should be considered. Pre and post contrast CT could alternatively be performed, but would likely be of decreased accuracy given lesion size. 3. Mild cylindrical bronchiectasis. 4.  Aortic atherosclerosis (ICD10-I70.0). Electronically Signed   By: Lorin Picket M.D.   On: 09/07/2022 14:41   DG Chest Port 1 View  Result Date: 09/04/2022 CLINICAL DATA:  SHOB EXAM: PORTABLE CHEST 1 VIEW COMPARISON:  Chest x-ray September 02, 2022. FINDINGS: Mild subsegmental left basilar streaky opacities. Nodular opacity in the right midlung. Cardiomediastinal silhouette is within normal limits. CABG and median sternotomy. ACDF. Cholecystectomy clips. IMPRESSION: 1. Nodular opacity in the right midlung. Recommend chest CT (preferably contrast) to further evaluate and exclude pulmonary nodule. 2. Mild subsegmental left basilar atelectasis. Electronically Signed   By: Margaretha Sheffield M.D.   On: 09/04/2022 10:04   DG Cervical Spine 1 View  Result Date: 08/27/2022 CLINICAL DATA:  Cervical  fusion EXAM: DG CERVICAL SPINE - 1 VIEW COMPARISON:  None Available. FINDINGS: Two fluoroscopic images obtained during cervical spinal fusion. 16 seconds of fluoro time utilized. Radiation dose 2.03 mGy Kerma. Please see performing physicians operative report for full details IMPRESSION: Fluoroscopic images were obtained for intraoperative guidance of cervical spinal fusion. Electronically Signed   By: Darrin Nipper M.D.   On: 08/27/2022 13:49   DG C-Arm 1-60 Min-No Report  Result Date: 08/27/2022 Fluoroscopy  was utilized by the requesting physician.  No radiographic interpretation.   DG C-Arm 1-60 Min-No Report  Result Date: 08/27/2022 Fluoroscopy was utilized by the requesting physician.  No radiographic interpretation.   DG C-Arm 1-60 Min-No Report  Result Date: 08/27/2022 Fluoroscopy was utilized by the requesting physician.  No radiographic interpretation.     TODAY-DAY OF DISCHARGE:  Subjective:   Faith Gaines today has no headache,no chest abdominal pain,no new weakness tingling or numbness, feels much better wants to go home today.   Objective:   Blood pressure 129/69, pulse 70, temperature 98.2 F (36.8 C), temperature source Oral, resp. rate 19, height 5' 1"$  (1.549 m), weight 48.1 kg, SpO2 100 %. No intake or output data in the 24 hours ending 09/08/22 1109  Filed Weights   09/04/22 0930 09/04/22 1617  Weight: 48.1 kg 48.1 kg    Exam: Awake Alert, Oriented *3, No new F.N deficits, Normal affect [place].AT,PERRAL Supple Neck,No JVD, No cervical lymphadenopathy appriciated.  Symmetrical Chest wall movement, Good air movement bilaterally, CTAB RRR,No Gallops,Rubs or new Murmurs, No Parasternal Heave +ve B.Sounds, Abd Soft, Non tender, No organomegaly appriciated, No rebound -guarding or rigidity. No Cyanosis, Clubbing or edema, No new Rash or bruise   PERTINENT RADIOLOGIC STUDIES: CT CHEST WO CONTRAST  Result Date: 09/07/2022 CLINICAL DATA:  Lung nodule. EXAM: CT CHEST  WITHOUT CONTRAST TECHNIQUE: Multidetector CT imaging of the chest was performed following the standard protocol without IV contrast. RADIATION DOSE REDUCTION: This exam was performed according to the departmental dose-optimization program which includes automated exposure control, adjustment of the mA and/or kV according to patient size and/or use of iterative reconstruction technique. COMPARISON:  Chest radiograph 09/04/2022 and CT chest 07/26/2020. FINDINGS: Cardiovascular: Atherosclerotic calcification of the aorta and aortic valve. Heart is at the upper limits of normal in size. No pericardial effusion. Mediastinum/Nodes: No pathologically enlarged mediastinal or axillary lymph nodes. Hilar regions are difficult to definitively evaluate without IV contrast. Esophagus is grossly unremarkable. Lungs/Pleura: Mild cylindrical bronchiectasis. Minimal scattered parenchymal scarring. No suspicious pulmonary nodules. No pleural fluid. Airway is otherwise unremarkable. Upper Abdomen: Visualized portions of the liver, adrenal glands and right kidney are unremarkable. 12 mm lesion in the upper pole left kidney measures 27 Hounsfield units, has enlarged slightly from 07/26/2020 (9 mm) and cannot be characterized as a simple cyst. Visualized portions of the spleen, pancreas, stomach and bowel are grossly unremarkable. Cholecystectomy. No upper abdominal adenopathy. Musculoskeletal: Degenerative changes in the spine. A bone island in the anterior right third rib accounts for the abnormality on recent chest radiograph. IMPRESSION: 1. A bone island in the anterior right third rib accounts for the abnormality on recent chest radiograph. No suspicious pulmonary nodules. 2. 12 mm minimally hyperdense lesion in the left kidney has enlarged slightly from 07/26/2020 and cannot be characterized as a simple cyst. Further evaluation with pre and post contrast MRI should be considered. Pre and post contrast CT could alternatively be  performed, but would likely be of decreased accuracy given lesion size. 3. Mild cylindrical bronchiectasis. 4.  Aortic atherosclerosis (ICD10-I70.0). Electronically Signed   By: Lorin Picket M.D.   On: 09/07/2022 14:41     PERTINENT LAB RESULTS: CBC: No results for input(s): "WBC", "HGB", "HCT", "PLT" in the last 72 hours.  CMET CMP     Component Value Date/Time   NA 141 09/07/2022 0431   NA 141 02/04/2021 0000   K 3.6 09/07/2022 0431   CL 106 09/07/2022 0431   CO2 26 09/07/2022  0431   GLUCOSE 95 09/07/2022 0431   BUN <5 (L) 09/07/2022 0431   BUN 16 02/04/2021 0000   CREATININE 0.60 09/07/2022 0431   CREATININE 1.08 (H) 05/27/2022 0947   CALCIUM 9.7 09/07/2022 0431   PROT 7.5 09/04/2022 0938   PROT 6.8 03/11/2021 0815   ALBUMIN 3.7 09/04/2022 0938   ALBUMIN 4.7 03/11/2021 0815   AST 23 09/04/2022 0938   AST 14 (L) 05/27/2022 0947   ALT 13 09/04/2022 0938   ALT 13 05/27/2022 0947   ALKPHOS 93 09/04/2022 0938   BILITOT 2.0 (H) 09/04/2022 0938   BILITOT 0.6 05/27/2022 0947   GFRNONAA >60 09/07/2022 0431   GFRNONAA 54 (L) 05/27/2022 0947   GFRAA >60 02/04/2020 0351    GFR Estimated Creatinine Clearance: 46.6 mL/min (by C-G formula based on SCr of 0.6 mg/dL). No results for input(s): "LIPASE", "AMYLASE" in the last 72 hours. No results for input(s): "CKTOTAL", "CKMB", "CKMBINDEX", "TROPONINI" in the last 72 hours. Invalid input(s): "POCBNP" No results for input(s): "DDIMER" in the last 72 hours. No results for input(s): "HGBA1C" in the last 72 hours. No results for input(s): "CHOL", "HDL", "LDLCALC", "TRIG", "CHOLHDL", "LDLDIRECT" in the last 72 hours. No results for input(s): "TSH", "T4TOTAL", "T3FREE", "THYROIDAB" in the last 72 hours.  Invalid input(s): "FREET3" No results for input(s): "VITAMINB12", "FOLATE", "FERRITIN", "TIBC", "IRON", "RETICCTPCT" in the last 72 hours. Coags: No results for input(s): "INR" in the last 72 hours.  Invalid input(s):  "PT" Microbiology: Recent Results (from the past 240 hour(s))  Resp panel by RT-PCR (RSV, Flu A&B, Covid) Anterior Nasal Swab     Status: None   Collection Time: 09/04/22 11:16 AM   Specimen: Anterior Nasal Swab  Result Value Ref Range Status   SARS Coronavirus 2 by RT PCR NEGATIVE NEGATIVE Final   Influenza A by PCR NEGATIVE NEGATIVE Final   Influenza B by PCR NEGATIVE NEGATIVE Final    Comment: (NOTE) The Xpert Xpress SARS-CoV-2/FLU/RSV plus assay is intended as an aid in the diagnosis of influenza from Nasopharyngeal swab specimens and should not be used as a sole basis for treatment. Nasal washings and aspirates are unacceptable for Xpert Xpress SARS-CoV-2/FLU/RSV testing.  Fact Sheet for Patients: EntrepreneurPulse.com.au  Fact Sheet for Healthcare Providers: IncredibleEmployment.be  This test is not yet approved or cleared by the [place] FDA and has been authorized for detection and/or diagnosis of SARS-CoV-2 by FDA under an Emergency Use Authorization (EUA). This EUA will remain in effect (meaning this test can be used) for the duration of the COVID-19 declaration under Section 564(b)(1) of the Act, 21 U.S.C. section 360bbb-3(b)(1), unless the authorization is terminated or revoked.     Resp Syncytial Virus by PCR NEGATIVE NEGATIVE Final    Comment: (NOTE) Fact Sheet for Patients: EntrepreneurPulse.com.au  Fact Sheet for Healthcare Providers: IncredibleEmployment.be  This test is not yet approved or cleared by the [place] FDA and has been authorized for detection and/or diagnosis of SARS-CoV-2 by FDA under an Emergency Use Authorization (EUA). This EUA will remain in effect (meaning this test can be used) for the duration of the COVID-19 declaration under Section 564(b)(1) of the Act, 21 U.S.C. section 360bbb-3(b)(1), unless the authorization is terminated or revoked.  Performed at  [facility], [place] [address], [place], [place] [address]     FURTHER DISCHARGE INSTRUCTIONS:  Get Medicines reviewed and adjusted: Please take all your medications with you for your next visit with your Primary MD  Laboratory/radiological data: Please  request your Primary MD to go over all hospital tests and procedure/radiological results at the follow up, please ask your Primary MD to get all Hospital records sent to his/her office.  In some cases, they will be blood work, cultures and biopsy results pending at the time of your discharge. Please request that your primary care M.D. goes through all the records of your hospital data and follows up on these results.  Also Note the following: If you experience worsening of your admission symptoms, develop shortness of breath, life threatening emergency, suicidal or homicidal thoughts you must seek medical attention immediately by calling 911 or calling your MD immediately  if symptoms less severe.  You must read complete instructions/literature along with all the possible adverse reactions/side effects for all the Medicines you take and that have been prescribed to you. Take any new Medicines after you have completely understood and accpet all the possible adverse reactions/side effects.   Do not drive when taking Pain medications or sleeping medications (Benzodaizepines)  Do not take more than prescribed Pain, Sleep and Anxiety Medications. It is not advisable to combine anxiety,sleep and pain medications without talking with your primary care practitioner  Special Instructions: If you have smoked or chewed Tobacco  in the last 2 yrs please stop smoking, stop any regular Alcohol  and or any Recreational drug use.  Wear Seat belts while driving.  Please note: You were cared for by a hospitalist during your hospital stay. Once you are discharged, your primary care physician will handle any further medical issues. Please note that NO  REFILLS for any discharge medications will be authorized once you are discharged, as it is imperative that you return to your primary care physician (or establish a relationship with a primary care physician if you do not have one) for your post hospital discharge needs so that they can reassess your need for medications and monitor your lab values.  Total Time spent coordinating discharge including counseling, education and face to face time equals greater than 30 minutes.  SignedOren Binet 09/08/2022 11:09 AM

## 2022-09-09 DIAGNOSIS — M5412 Radiculopathy, cervical region: Secondary | ICD-10-CM | POA: Diagnosis not present

## 2022-09-10 NOTE — Discharge Summary (Signed)
Patient ID: Faith Gaines MRN: [id number] DOB/AGE: 1946-03-17 77 y.o.  Admit date: 08/27/2022 Discharge date: 08/27/2022  Admission Diagnoses:  Principal Problem:   Myelopathy [facility])   Discharge Diagnoses:  Same  Past Medical History:  Diagnosis Date   Anxiety    Bicipital tendonitis of left shoulder    Bilateral carotid artery stenosis without cerebral infarction    vascular--- dr Kellie Simmering---- s/p right CEA 12/ 2014;  last duplex in epic 07-15-2020  post right CEA with 1-39% stenosis and left ICA 1-39%   CKD (chronic kidney disease), stage III ([place])    followed by pcp   Coronary artery disease cardiologist--- dr Warren Lacy   02-01-2020 CABG x3   Depression    DOE (dyspnea on exertion)    per pt still healing from CABG 07/ 2021   GERD (gastroesophageal reflux disease)    History of left breast cancer oncologist--- dr c. 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 female  who was admitted 08/27/2022 for operative treatment of Myelopathy ([place]). Patient has severe unremitting pain that affects sleep, daily activities, and work/hobbies. After pre-op clearance the patient was taken to the operating room on 08/27/2022 and underwent  Procedure(s): ANTERIOR CERVICAL DECOMPRESSION FUSION CERVICAL 4- CERVICAL 5, CERVICAL 5- CERVICAL 6, CERVICAL 6- CERVICAL 7 WITH INSTRUMENTATION AND ALLOGRAFT.    Patient was given perioperative antibiotics:  Anti-infectives (From admission, onward)    Start     Dose/Rate Route Frequency Ordered Stop   08/27/22 0803  ceFAZolin (ANCEF) 2-4 GM/100ML-% IVPB       Note to Pharmacy: Ladoris Gene A: cabinet override      08/27/22 0803 08/27/22 1056   08/27/22 0800  ceFAZolin (ANCEF) IVPB 2g/100 mL premix        2 g 200 mL/hr over 30 Minutes Intravenous On call to O.R. 08/27/22 0758 08/27/22 1117        Patient was given sequential compression devices, early ambulation to prevent DVT.  Patient benefited maximally from hospital stay and there were no complications.    Recent vital signs: BP (!) 163/71   Pulse (!) 58   Temp (!) 97.2 F (36.2 C)   Resp 13   Ht 5' 1"$  (1.549 m)   Wt 51.8 kg   SpO2 96%   BMI 21.60 kg/m    Discharge Medications:  Allergies as of 08/27/2022       Reactions   Codeine Nausea And Vomiting, Other (See Comments)   And Headache  Other reaction(s): Unknown   Ace Inhibitors    Drop in GFR Other reaction(s): drop in GFR Other reaction(s): drop in GFR   Atorvastatin    Body aches Other reaction(s): body aches Other reaction(s): body aches   Clarithromycin    Unable to sleep Other reaction(s): unable to sleep Other reaction(s): unable to sleep   Hydroxychloroquine Sulfate Other (See Comments)   Per pt is has been so long ago, unsure reaction Other reaction(s): Itching Other reaction(s): Itching   Iodine    Other reaction(s): fungus? Other reaction(s): fungus? Other reaction(s): fungus?    Rosuvastatin    Body aches Other reaction(s): body aches   Avandamet [rosiglitazone-metformin] Rash, Other (See Comments)   Makes her hyper   Betadine [povidone Iodine] Rash   Other Rash   Other reaction(s): body aches Other reaction(s): rash Other reaction(s): rash        Medication List     STOP taking these medications    aspirin EC 81 MG tablet   Fish Oil 1000 MG Caps       TAKE these medications    Bystolic 20 MG Tabs Generic drug: Nebivolol HCl Take 20 mg by mouth daily.   nebivolol 5 MG tablet Commonly known as: BYSTOLIC Take 5 mg by mouth daily.   famotidine 40 MG tablet Commonly known as: PEPCID Take 40 mg by mouth at bedtime.   gabapentin 300 MG capsule Commonly known as: NEURONTIN Take 1 capsule (300 mg total) by mouth 3 (three) times daily. What changed: when to take this   HYDROcodone-acetaminophen 5-325 MG tablet Commonly known as: NORCO/VICODIN Take 1 tablet by mouth every 6 (six) hours as needed for moderate pain or severe pain.   metFORMIN 500 MG 24 hr tablet Commonly known as: GLUCOPHAGE-XR Take 500 mg by mouth at bedtime.   methocarbamol 750 MG tablet Commonly known as: ROBAXIN Take 1 tablet (750 mg total) by mouth every 6 (six) hours as needed for muscle spasms.   ondansetron 4 MG tablet Commonly known as: Zofran Take 1 tablet (4 mg total) by mouth every 8 (eight) hours as needed for nausea or vomiting.   pantoprazole 40 MG tablet Commonly known as: PROTONIX Take 40 mg by mouth daily.   PRESERVISION AREDS 2 PO Take 1 capsule by mouth daily.   Repatha SureClick [id number] MG/ML Soaj Generic drug: Evolocumab Inject 140 mg into the skin every 14 (fourteen) days.   vitamin B-12 500 MCG tablet Commonly known as: CYANOCOBALAMIN Take 500 mcg by mouth daily.   Vitamin D3 50 MCG (2000 UT) Tabs Take 2,000 Units by mouth daily.        Diagnostic Studies: CT CHEST WO CONTRAST  Result Date: 09/07/2022 CLINICAL DATA:  Lung nodule.  EXAM: CT CHEST WITHOUT CONTRAST TECHNIQUE: Multidetector CT imaging of the chest was performed following the standard protocol without IV contrast. RADIATION DOSE REDUCTION: This exam was performed according to the departmental dose-optimization program which includes automated exposure control, adjustment of the mA and/or kV according to patient size and/or use of iterative reconstruction technique. COMPARISON:  Chest radiograph 09/04/2022 and CT chest 07/26/2020. FINDINGS: Cardiovascular: Atherosclerotic calcification of the aorta and aortic valve. Heart is at the upper limits of normal in size. No pericardial effusion. Mediastinum/Nodes: No pathologically enlarged mediastinal or axillary lymph nodes. Hilar regions are difficult to definitively evaluate without IV contrast.  Esophagus is grossly unremarkable. Lungs/Pleura: Mild cylindrical bronchiectasis. Minimal scattered parenchymal scarring. No suspicious pulmonary nodules. No pleural fluid. Airway is otherwise unremarkable. Upper Abdomen: Visualized portions of the liver, adrenal glands and right kidney are unremarkable. 12 mm lesion in the upper pole left kidney measures 27 Hounsfield units, has enlarged slightly from 07/26/2020 (9 mm) and cannot be characterized as a simple cyst. Visualized portions of the spleen, pancreas, stomach and bowel are grossly unremarkable. Cholecystectomy. No upper abdominal adenopathy. Musculoskeletal: Degenerative changes in the spine. A bone island in the anterior right third rib accounts for the abnormality on recent chest radiograph. IMPRESSION: 1. A bone island in the anterior right third rib accounts for the abnormality on recent chest radiograph. No suspicious pulmonary nodules. 2. 12 mm minimally hyperdense lesion in the left kidney has enlarged slightly from 07/26/2020 and cannot be characterized as a simple cyst. Further evaluation with pre and post contrast MRI should be considered. Pre and post contrast CT could  alternatively be performed, but would likely be of decreased accuracy given lesion size. 3. Mild cylindrical bronchiectasis. 4.  Aortic atherosclerosis (ICD10-I70.0). Electronically Signed   By: Lorin Picket M.D.   On: 09/07/2022 14:41   DG Chest Port 1 View  Result Date: 09/04/2022 CLINICAL DATA:  SHOB EXAM: PORTABLE CHEST 1 VIEW COMPARISON:  Chest x-ray September 02, 2022. FINDINGS: Mild subsegmental left basilar streaky opacities. Nodular opacity in the right midlung. Cardiomediastinal silhouette is within normal limits. CABG and median sternotomy. ACDF. Cholecystectomy clips. IMPRESSION: 1. Nodular opacity in the right midlung. Recommend chest CT (preferably contrast) to further evaluate and exclude pulmonary nodule. 2. Mild subsegmental left basilar atelectasis. Electronically Signed   By: Margaretha Sheffield M.D.   On: 09/04/2022 10:04   DG Cervical Spine 1 View  Result Date: 08/27/2022 CLINICAL DATA:  Cervical fusion EXAM: DG CERVICAL SPINE - 1 VIEW COMPARISON:  None Available. FINDINGS: Two fluoroscopic images obtained during cervical spinal fusion. 16 seconds of fluoro time utilized. Radiation dose 2.03 mGy Kerma. Please see performing physicians operative report for full details IMPRESSION: Fluoroscopic images were obtained for intraoperative guidance of cervical spinal fusion. Electronically Signed   By: Darrin Nipper M.D.   On: 08/27/2022 13:49   DG C-Arm 1-60 Min-No Report  Result Date: 08/27/2022 Fluoroscopy was utilized by the requesting physician.  No radiographic interpretation.   DG C-Arm 1-60 Min-No Report  Result Date: 08/27/2022 Fluoroscopy was utilized by the requesting physician.  No radiographic interpretation.   DG C-Arm 1-60 Min-No Report  Result Date: 08/27/2022 Fluoroscopy was utilized by the requesting physician.  No radiographic interpretation.    Disposition: Discharge disposition: 01-Home or Self Care       Discharge Instructions     Discharge patient   Complete  by: As directed    Discharge disposition: 01-Home or Self Care   Discharge patient date: 08/27/2022      C4-7 ACDF for L Radiculopathy -Scripts for pain sent to pharmacy electronically  -D/C instructions sheet printed and in chart -D/C today  -F/U in office 2 weeks   Signed: Lennie Muckle Madeleyn Schwimmer 09/10/2022, 11:24 AM

## 2022-09-11 DIAGNOSIS — E785 Hyperlipidemia, unspecified: Secondary | ICD-10-CM | POA: Diagnosis not present

## 2022-09-11 DIAGNOSIS — I6523 Occlusion and stenosis of bilateral carotid arteries: Secondary | ICD-10-CM | POA: Diagnosis not present

## 2022-09-11 DIAGNOSIS — N1832 Chronic kidney disease, stage 3b: Secondary | ICD-10-CM | POA: Diagnosis not present

## 2022-09-11 DIAGNOSIS — E1110 Type 2 diabetes mellitus with ketoacidosis without coma: Secondary | ICD-10-CM | POA: Diagnosis not present

## 2022-09-11 DIAGNOSIS — I129 Hypertensive chronic kidney disease with stage 1 through stage 4 chronic kidney disease, or unspecified chronic kidney disease: Secondary | ICD-10-CM | POA: Diagnosis not present

## 2022-09-11 DIAGNOSIS — N289 Disorder of kidney and ureter, unspecified: Secondary | ICD-10-CM | POA: Diagnosis not present

## 2022-09-11 DIAGNOSIS — K219 Gastro-esophageal reflux disease without esophagitis: Secondary | ICD-10-CM | POA: Diagnosis not present

## 2022-09-11 DIAGNOSIS — D631 Anemia in chronic kidney disease: Secondary | ICD-10-CM | POA: Diagnosis not present

## 2022-09-11 DIAGNOSIS — F419 Anxiety disorder, unspecified: Secondary | ICD-10-CM | POA: Diagnosis not present

## 2022-09-11 DIAGNOSIS — I2510 Atherosclerotic heart disease of native coronary artery without angina pectoris: Secondary | ICD-10-CM | POA: Diagnosis not present

## 2022-09-11 DIAGNOSIS — F32A Depression, unspecified: Secondary | ICD-10-CM | POA: Diagnosis not present

## 2022-09-11 DIAGNOSIS — M7522 Bicipital tendinitis, left shoulder: Secondary | ICD-10-CM | POA: Diagnosis not present

## 2022-09-11 DIAGNOSIS — E1122 Type 2 diabetes mellitus with diabetic chronic kidney disease: Secondary | ICD-10-CM | POA: Diagnosis not present

## 2022-09-11 DIAGNOSIS — G8912 Acute post-thoracotomy pain: Secondary | ICD-10-CM | POA: Diagnosis not present

## 2022-09-11 DIAGNOSIS — E1151 Type 2 diabetes mellitus with diabetic peripheral angiopathy without gangrene: Secondary | ICD-10-CM | POA: Diagnosis not present

## 2022-09-11 DIAGNOSIS — C50412 Malignant neoplasm of upper-outer quadrant of left female breast: Secondary | ICD-10-CM | POA: Diagnosis not present

## 2022-09-11 DIAGNOSIS — Z4789 Encounter for other orthopedic aftercare: Secondary | ICD-10-CM | POA: Diagnosis not present

## 2022-09-11 DIAGNOSIS — J9811 Atelectasis: Secondary | ICD-10-CM | POA: Diagnosis not present

## 2022-09-11 DIAGNOSIS — Z981 Arthrodesis status: Secondary | ICD-10-CM | POA: Diagnosis not present

## 2022-09-11 DIAGNOSIS — D509 Iron deficiency anemia, unspecified: Secondary | ICD-10-CM | POA: Diagnosis not present

## 2022-09-11 DIAGNOSIS — M8580 Other specified disorders of bone density and structure, unspecified site: Secondary | ICD-10-CM | POA: Diagnosis not present

## 2022-09-11 DIAGNOSIS — M1990 Unspecified osteoarthritis, unspecified site: Secondary | ICD-10-CM | POA: Diagnosis not present

## 2022-09-11 DIAGNOSIS — Z950 Presence of cardiac pacemaker: Secondary | ICD-10-CM | POA: Diagnosis not present

## 2022-09-11 DIAGNOSIS — D630 Anemia in neoplastic disease: Secondary | ICD-10-CM | POA: Diagnosis not present

## 2022-09-11 DIAGNOSIS — Z7982 Long term (current) use of aspirin: Secondary | ICD-10-CM | POA: Diagnosis not present

## 2022-09-15 DIAGNOSIS — I2510 Atherosclerotic heart disease of native coronary artery without angina pectoris: Secondary | ICD-10-CM | POA: Diagnosis not present

## 2022-09-15 DIAGNOSIS — E1110 Type 2 diabetes mellitus with ketoacidosis without coma: Secondary | ICD-10-CM | POA: Diagnosis not present

## 2022-09-15 DIAGNOSIS — G8912 Acute post-thoracotomy pain: Secondary | ICD-10-CM | POA: Diagnosis not present

## 2022-09-15 DIAGNOSIS — Z4789 Encounter for other orthopedic aftercare: Secondary | ICD-10-CM | POA: Diagnosis not present

## 2022-09-15 DIAGNOSIS — E1151 Type 2 diabetes mellitus with diabetic peripheral angiopathy without gangrene: Secondary | ICD-10-CM | POA: Diagnosis not present

## 2022-09-15 DIAGNOSIS — J9811 Atelectasis: Secondary | ICD-10-CM | POA: Diagnosis not present

## 2022-09-16 DIAGNOSIS — G8912 Acute post-thoracotomy pain: Secondary | ICD-10-CM | POA: Diagnosis not present

## 2022-09-16 DIAGNOSIS — E1151 Type 2 diabetes mellitus with diabetic peripheral angiopathy without gangrene: Secondary | ICD-10-CM | POA: Diagnosis not present

## 2022-09-16 DIAGNOSIS — I2510 Atherosclerotic heart disease of native coronary artery without angina pectoris: Secondary | ICD-10-CM | POA: Diagnosis not present

## 2022-09-16 DIAGNOSIS — E1110 Type 2 diabetes mellitus with ketoacidosis without coma: Secondary | ICD-10-CM | POA: Diagnosis not present

## 2022-09-16 DIAGNOSIS — J9811 Atelectasis: Secondary | ICD-10-CM | POA: Diagnosis not present

## 2022-09-16 DIAGNOSIS — Z4789 Encounter for other orthopedic aftercare: Secondary | ICD-10-CM | POA: Diagnosis not present

## 2022-09-17 DIAGNOSIS — J9811 Atelectasis: Secondary | ICD-10-CM | POA: Diagnosis not present

## 2022-09-17 DIAGNOSIS — G8912 Acute post-thoracotomy pain: Secondary | ICD-10-CM | POA: Diagnosis not present

## 2022-09-17 DIAGNOSIS — I2510 Atherosclerotic heart disease of native coronary artery without angina pectoris: Secondary | ICD-10-CM | POA: Diagnosis not present

## 2022-09-17 DIAGNOSIS — Z4789 Encounter for other orthopedic aftercare: Secondary | ICD-10-CM | POA: Diagnosis not present

## 2022-09-17 DIAGNOSIS — E1110 Type 2 diabetes mellitus with ketoacidosis without coma: Secondary | ICD-10-CM | POA: Diagnosis not present

## 2022-09-17 DIAGNOSIS — E1151 Type 2 diabetes mellitus with diabetic peripheral angiopathy without gangrene: Secondary | ICD-10-CM | POA: Diagnosis not present

## 2022-09-18 DIAGNOSIS — Z6820 Body mass index (BMI) 20.0-20.9, adult: Secondary | ICD-10-CM | POA: Diagnosis not present

## 2022-09-18 DIAGNOSIS — N2889 Other specified disorders of kidney and ureter: Secondary | ICD-10-CM | POA: Diagnosis not present

## 2022-09-18 DIAGNOSIS — E1169 Type 2 diabetes mellitus with other specified complication: Secondary | ICD-10-CM | POA: Diagnosis not present

## 2022-09-18 DIAGNOSIS — E782 Mixed hyperlipidemia: Secondary | ICD-10-CM | POA: Diagnosis not present

## 2022-09-22 DIAGNOSIS — J9811 Atelectasis: Secondary | ICD-10-CM | POA: Diagnosis not present

## 2022-09-22 DIAGNOSIS — E1110 Type 2 diabetes mellitus with ketoacidosis without coma: Secondary | ICD-10-CM | POA: Diagnosis not present

## 2022-09-22 DIAGNOSIS — G8912 Acute post-thoracotomy pain: Secondary | ICD-10-CM | POA: Diagnosis not present

## 2022-09-22 DIAGNOSIS — Z4789 Encounter for other orthopedic aftercare: Secondary | ICD-10-CM | POA: Diagnosis not present

## 2022-09-22 DIAGNOSIS — I2510 Atherosclerotic heart disease of native coronary artery without angina pectoris: Secondary | ICD-10-CM | POA: Diagnosis not present

## 2022-09-22 DIAGNOSIS — E1151 Type 2 diabetes mellitus with diabetic peripheral angiopathy without gangrene: Secondary | ICD-10-CM | POA: Diagnosis not present

## 2022-09-24 DIAGNOSIS — E1151 Type 2 diabetes mellitus with diabetic peripheral angiopathy without gangrene: Secondary | ICD-10-CM | POA: Diagnosis not present

## 2022-09-24 DIAGNOSIS — J9811 Atelectasis: Secondary | ICD-10-CM | POA: Diagnosis not present

## 2022-09-24 DIAGNOSIS — I2510 Atherosclerotic heart disease of native coronary artery without angina pectoris: Secondary | ICD-10-CM | POA: Diagnosis not present

## 2022-09-24 DIAGNOSIS — Z4789 Encounter for other orthopedic aftercare: Secondary | ICD-10-CM | POA: Diagnosis not present

## 2022-09-24 DIAGNOSIS — G8912 Acute post-thoracotomy pain: Secondary | ICD-10-CM | POA: Diagnosis not present

## 2022-09-24 DIAGNOSIS — E1110 Type 2 diabetes mellitus with ketoacidosis without coma: Secondary | ICD-10-CM | POA: Diagnosis not present

## 2022-09-28 DIAGNOSIS — G8912 Acute post-thoracotomy pain: Secondary | ICD-10-CM | POA: Diagnosis not present

## 2022-09-28 DIAGNOSIS — J9811 Atelectasis: Secondary | ICD-10-CM | POA: Diagnosis not present

## 2022-09-28 DIAGNOSIS — I2510 Atherosclerotic heart disease of native coronary artery without angina pectoris: Secondary | ICD-10-CM | POA: Diagnosis not present

## 2022-09-28 DIAGNOSIS — E1151 Type 2 diabetes mellitus with diabetic peripheral angiopathy without gangrene: Secondary | ICD-10-CM | POA: Diagnosis not present

## 2022-09-28 DIAGNOSIS — E1110 Type 2 diabetes mellitus with ketoacidosis without coma: Secondary | ICD-10-CM | POA: Diagnosis not present

## 2022-09-28 DIAGNOSIS — Z4789 Encounter for other orthopedic aftercare: Secondary | ICD-10-CM | POA: Diagnosis not present

## 2022-10-01 DIAGNOSIS — G8912 Acute post-thoracotomy pain: Secondary | ICD-10-CM | POA: Diagnosis not present

## 2022-10-01 DIAGNOSIS — I2510 Atherosclerotic heart disease of native coronary artery without angina pectoris: Secondary | ICD-10-CM | POA: Diagnosis not present

## 2022-10-01 DIAGNOSIS — E1110 Type 2 diabetes mellitus with ketoacidosis without coma: Secondary | ICD-10-CM | POA: Diagnosis not present

## 2022-10-01 DIAGNOSIS — J9811 Atelectasis: Secondary | ICD-10-CM | POA: Diagnosis not present

## 2022-10-01 DIAGNOSIS — E1151 Type 2 diabetes mellitus with diabetic peripheral angiopathy without gangrene: Secondary | ICD-10-CM | POA: Diagnosis not present

## 2022-10-01 DIAGNOSIS — Z4789 Encounter for other orthopedic aftercare: Secondary | ICD-10-CM | POA: Diagnosis not present

## 2022-10-05 DIAGNOSIS — M5412 Radiculopathy, cervical region: Secondary | ICD-10-CM | POA: Diagnosis not present

## 2022-10-07 DIAGNOSIS — J9811 Atelectasis: Secondary | ICD-10-CM | POA: Diagnosis not present

## 2022-10-07 DIAGNOSIS — I2510 Atherosclerotic heart disease of native coronary artery without angina pectoris: Secondary | ICD-10-CM | POA: Diagnosis not present

## 2022-10-07 DIAGNOSIS — E1151 Type 2 diabetes mellitus with diabetic peripheral angiopathy without gangrene: Secondary | ICD-10-CM | POA: Diagnosis not present

## 2022-10-07 DIAGNOSIS — E1110 Type 2 diabetes mellitus with ketoacidosis without coma: Secondary | ICD-10-CM | POA: Diagnosis not present

## 2022-10-07 DIAGNOSIS — G8912 Acute post-thoracotomy pain: Secondary | ICD-10-CM | POA: Diagnosis not present

## 2022-10-07 DIAGNOSIS — Z4789 Encounter for other orthopedic aftercare: Secondary | ICD-10-CM | POA: Diagnosis not present

## 2022-10-11 DIAGNOSIS — I6523 Occlusion and stenosis of bilateral carotid arteries: Secondary | ICD-10-CM | POA: Diagnosis not present

## 2022-10-11 DIAGNOSIS — F32A Depression, unspecified: Secondary | ICD-10-CM | POA: Diagnosis not present

## 2022-10-11 DIAGNOSIS — Z981 Arthrodesis status: Secondary | ICD-10-CM | POA: Diagnosis not present

## 2022-10-11 DIAGNOSIS — D631 Anemia in chronic kidney disease: Secondary | ICD-10-CM | POA: Diagnosis not present

## 2022-10-11 DIAGNOSIS — N289 Disorder of kidney and ureter, unspecified: Secondary | ICD-10-CM | POA: Diagnosis not present

## 2022-10-11 DIAGNOSIS — I129 Hypertensive chronic kidney disease with stage 1 through stage 4 chronic kidney disease, or unspecified chronic kidney disease: Secondary | ICD-10-CM | POA: Diagnosis not present

## 2022-10-11 DIAGNOSIS — M1990 Unspecified osteoarthritis, unspecified site: Secondary | ICD-10-CM | POA: Diagnosis not present

## 2022-10-11 DIAGNOSIS — N1832 Chronic kidney disease, stage 3b: Secondary | ICD-10-CM | POA: Diagnosis not present

## 2022-10-11 DIAGNOSIS — M8580 Other specified disorders of bone density and structure, unspecified site: Secondary | ICD-10-CM | POA: Diagnosis not present

## 2022-10-11 DIAGNOSIS — J9811 Atelectasis: Secondary | ICD-10-CM | POA: Diagnosis not present

## 2022-10-11 DIAGNOSIS — M7522 Bicipital tendinitis, left shoulder: Secondary | ICD-10-CM | POA: Diagnosis not present

## 2022-10-11 DIAGNOSIS — E785 Hyperlipidemia, unspecified: Secondary | ICD-10-CM | POA: Diagnosis not present

## 2022-10-11 DIAGNOSIS — E1122 Type 2 diabetes mellitus with diabetic chronic kidney disease: Secondary | ICD-10-CM | POA: Diagnosis not present

## 2022-10-11 DIAGNOSIS — D509 Iron deficiency anemia, unspecified: Secondary | ICD-10-CM | POA: Diagnosis not present

## 2022-10-11 DIAGNOSIS — G8912 Acute post-thoracotomy pain: Secondary | ICD-10-CM | POA: Diagnosis not present

## 2022-10-11 DIAGNOSIS — E1110 Type 2 diabetes mellitus with ketoacidosis without coma: Secondary | ICD-10-CM | POA: Diagnosis not present

## 2022-10-11 DIAGNOSIS — Z950 Presence of cardiac pacemaker: Secondary | ICD-10-CM | POA: Diagnosis not present

## 2022-10-11 DIAGNOSIS — Z4789 Encounter for other orthopedic aftercare: Secondary | ICD-10-CM | POA: Diagnosis not present

## 2022-10-11 DIAGNOSIS — I2510 Atherosclerotic heart disease of native coronary artery without angina pectoris: Secondary | ICD-10-CM | POA: Diagnosis not present

## 2022-10-11 DIAGNOSIS — E1151 Type 2 diabetes mellitus with diabetic peripheral angiopathy without gangrene: Secondary | ICD-10-CM | POA: Diagnosis not present

## 2022-10-11 DIAGNOSIS — C50412 Malignant neoplasm of upper-outer quadrant of left female breast: Secondary | ICD-10-CM | POA: Diagnosis not present

## 2022-10-11 DIAGNOSIS — Z7982 Long term (current) use of aspirin: Secondary | ICD-10-CM | POA: Diagnosis not present

## 2022-10-11 DIAGNOSIS — F419 Anxiety disorder, unspecified: Secondary | ICD-10-CM | POA: Diagnosis not present

## 2022-10-11 DIAGNOSIS — K219 Gastro-esophageal reflux disease without esophagitis: Secondary | ICD-10-CM | POA: Diagnosis not present

## 2022-10-11 DIAGNOSIS — D630 Anemia in neoplastic disease: Secondary | ICD-10-CM | POA: Diagnosis not present

## 2022-10-12 DIAGNOSIS — J9811 Atelectasis: Secondary | ICD-10-CM | POA: Diagnosis not present

## 2022-10-12 DIAGNOSIS — E1110 Type 2 diabetes mellitus with ketoacidosis without coma: Secondary | ICD-10-CM | POA: Diagnosis not present

## 2022-10-12 DIAGNOSIS — I2510 Atherosclerotic heart disease of native coronary artery without angina pectoris: Secondary | ICD-10-CM | POA: Diagnosis not present

## 2022-10-12 DIAGNOSIS — G8912 Acute post-thoracotomy pain: Secondary | ICD-10-CM | POA: Diagnosis not present

## 2022-10-12 DIAGNOSIS — E1151 Type 2 diabetes mellitus with diabetic peripheral angiopathy without gangrene: Secondary | ICD-10-CM | POA: Diagnosis not present

## 2022-10-12 DIAGNOSIS — Z4789 Encounter for other orthopedic aftercare: Secondary | ICD-10-CM | POA: Diagnosis not present

## 2022-10-27 DIAGNOSIS — Z4789 Encounter for other orthopedic aftercare: Secondary | ICD-10-CM | POA: Diagnosis not present

## 2022-10-27 DIAGNOSIS — J9811 Atelectasis: Secondary | ICD-10-CM | POA: Diagnosis not present

## 2022-10-27 DIAGNOSIS — I2510 Atherosclerotic heart disease of native coronary artery without angina pectoris: Secondary | ICD-10-CM | POA: Diagnosis not present

## 2022-10-27 DIAGNOSIS — E1110 Type 2 diabetes mellitus with ketoacidosis without coma: Secondary | ICD-10-CM | POA: Diagnosis not present

## 2022-10-27 DIAGNOSIS — G8912 Acute post-thoracotomy pain: Secondary | ICD-10-CM | POA: Diagnosis not present

## 2022-10-27 DIAGNOSIS — E1151 Type 2 diabetes mellitus with diabetic peripheral angiopathy without gangrene: Secondary | ICD-10-CM | POA: Diagnosis not present

## 2022-10-29 DIAGNOSIS — M4722 Other spondylosis with radiculopathy, cervical region: Secondary | ICD-10-CM | POA: Diagnosis not present

## 2022-10-29 DIAGNOSIS — M542 Cervicalgia: Secondary | ICD-10-CM | POA: Diagnosis not present

## 2022-10-30 DIAGNOSIS — M542 Cervicalgia: Secondary | ICD-10-CM | POA: Diagnosis not present

## 2022-10-30 DIAGNOSIS — M5412 Radiculopathy, cervical region: Secondary | ICD-10-CM | POA: Diagnosis not present

## 2022-11-02 DIAGNOSIS — M542 Cervicalgia: Secondary | ICD-10-CM | POA: Diagnosis not present

## 2022-11-03 DIAGNOSIS — M4722 Other spondylosis with radiculopathy, cervical region: Secondary | ICD-10-CM | POA: Diagnosis not present

## 2022-11-03 DIAGNOSIS — M542 Cervicalgia: Secondary | ICD-10-CM | POA: Diagnosis not present

## 2022-11-04 DIAGNOSIS — L821 Other seborrheic keratosis: Secondary | ICD-10-CM | POA: Diagnosis not present

## 2022-11-04 DIAGNOSIS — L905 Scar conditions and fibrosis of skin: Secondary | ICD-10-CM | POA: Diagnosis not present

## 2022-11-04 DIAGNOSIS — Z85820 Personal history of malignant melanoma of skin: Secondary | ICD-10-CM | POA: Diagnosis not present

## 2022-11-04 DIAGNOSIS — D225 Melanocytic nevi of trunk: Secondary | ICD-10-CM | POA: Diagnosis not present

## 2022-11-04 DIAGNOSIS — L814 Other melanin hyperpigmentation: Secondary | ICD-10-CM | POA: Diagnosis not present

## 2022-11-04 DIAGNOSIS — Z86018 Personal history of other benign neoplasm: Secondary | ICD-10-CM | POA: Diagnosis not present

## 2022-11-04 DIAGNOSIS — L820 Inflamed seborrheic keratosis: Secondary | ICD-10-CM | POA: Diagnosis not present

## 2022-11-04 DIAGNOSIS — L578 Other skin changes due to chronic exposure to nonionizing radiation: Secondary | ICD-10-CM | POA: Diagnosis not present

## 2022-11-04 DIAGNOSIS — Z85828 Personal history of other malignant neoplasm of skin: Secondary | ICD-10-CM | POA: Diagnosis not present

## 2022-11-05 DIAGNOSIS — M4722 Other spondylosis with radiculopathy, cervical region: Secondary | ICD-10-CM | POA: Diagnosis not present

## 2022-11-05 DIAGNOSIS — M542 Cervicalgia: Secondary | ICD-10-CM | POA: Diagnosis not present

## 2022-11-08 NOTE — Progress Notes (Unsigned)
  Cardiology Office Note:   Date:  11/09/2022  ID:  Faith Gaines, DOB 07/27/1945, MRN [id number]  History of Present Illness:   Faith Gaines is a 77 y.o. female with a history of CAD/CABG.  She was hospitalized in Feb 2024 with diabetic ketoacidosis.  She had AKI.  I reviewed these records for this visit.     She unfortunately has had a lot of problems since having neck surgery.  She is having a lot of arm pain.  She is getting reimaged and might need something further done.  This discomfort has radiated somewhat over to her chest but she has not things like previous angina.  She is not able to do as much activity although she is still doing some walking and physical therapy.  She has occasional racing heartbeat but she thinks it is related to her pain.  She denies any new shortness of breath, PND or orthopnea.  She has had no presyncope or syncope.  ROS: As stated in the HPI and negative for all other systems.  Studies Reviewed:    EKG:  NA  Risk Assessment/Calculations:      Physical Exam:   VS:  BP (!) 142/70 (BP Location: Right Arm, Patient Position: Sitting, Cuff Size: Normal)   Pulse 77   Ht  (1.549 m)   Wt 111 lb 12.8 oz (50.7 kg)   SpO2 96%   BMI 21.12 kg/m    Wt Readings from Last 3 Encounters:  11/09/22 111 lb 12.8 oz (50.7 kg)  09/04/22 106 lb (48.1 kg)  08/27/22 114 lb 4.8 oz (51.8 kg)     GEN: Well nourished, well developed in no acute distress NECK: No JVD; No carotid bruits CARDIAC: RRR, no murmurs, rubs, gallops RESPIRATORY:  Clear to auscultation without rales, wheezing or rhonchi  ABDOMEN: Soft, non-tender, non-distended EXTREMITIES:  No edema; No deformity   ASSESSMENT AND PLAN:   CAD/CABG:    The patient has no new sypmtoms.  No further cardiovascular testing is indicated.  We will continue with aggressive risk reduction and meds as listed.   DM: A1c was 7.8.  However, she has had a lot of trouble with her blood sugar  because her diet has been off.  There were times when she has not been able to eat and she has been well.  At other times she does hide that she is having nausea if she takes metformin.  She is working this out with her new primary provider.  DYSLIPIDEMIA:   LDL was -2.  She continues on Repatha.  I did tell her she could reduce her fish oil to 2 pills a day.   Signed, Rollene Rotunda, MD

## 2022-11-09 ENCOUNTER — Ambulatory Visit: Payer: Medicare Other | Attending: Cardiology | Admitting: Cardiology

## 2022-11-09 ENCOUNTER — Other Ambulatory Visit: Payer: Self-pay | Admitting: Cardiology

## 2022-11-09 ENCOUNTER — Encounter: Payer: Self-pay | Admitting: Cardiology

## 2022-11-09 VITALS — BP 142/70 | HR 77 | Ht 61.0 in | Wt 111.8 lb

## 2022-11-09 DIAGNOSIS — E785 Hyperlipidemia, unspecified: Secondary | ICD-10-CM | POA: Insufficient documentation

## 2022-11-09 DIAGNOSIS — I2510 Atherosclerotic heart disease of native coronary artery without angina pectoris: Secondary | ICD-10-CM

## 2022-11-09 DIAGNOSIS — E118 Type 2 diabetes mellitus with unspecified complications: Secondary | ICD-10-CM | POA: Diagnosis not present

## 2022-11-09 DIAGNOSIS — I6523 Occlusion and stenosis of bilateral carotid arteries: Secondary | ICD-10-CM

## 2022-11-09 DIAGNOSIS — E782 Mixed hyperlipidemia: Secondary | ICD-10-CM

## 2022-11-09 NOTE — Telephone Encounter (Signed)
*  STAT* If patient is at the pharmacy, call can be transferred to refill team.   1. Which medications need to be refilled? (please list name of each medication and dose if known)   Evolocumab (REPATHA SURECLICK) 140 MG/ML SOAJ    2. Which pharmacy/location (including street and city if local pharmacy) is medication to be sent to? [facility] - [place], [place] - [address]   3. Do they need a 30 day or 90 day supply? 6 mls , 3 refills

## 2022-11-09 NOTE — Patient Instructions (Signed)
Medication Instructions:  Your physician recommends that you continue on your current medications as directed. Please refer to the Current Medication list given to you today.  *If you need a refill on your cardiac medications before your next appointment, please call your pharmacy*  Follow-Up: At [facility], you and your health needs are our priority.  As part of our continuing mission to provide you with exceptional heart care, we have created designated Provider Care Teams.  These Care Teams include your primary Cardiologist (physician) and Advanced Practice Providers (APPs -  Physician Assistants and Nurse Practitioners) who all work together to provide you with the care you need, when you need it.  We recommend signing up for the patient portal called "MyChart".  Sign up information is provided on this After Visit Summary.  MyChart is used to connect with patients for Virtual Visits (Telemedicine).  Patients are able to view lab/test results, encounter notes, upcoming appointments, etc.  Non-urgent messages can be sent to your provider as well.   To learn more about what you can do with MyChart, go to [email or web address].    Your next appointment:   12 month(s)  Provider:   James Hochrein, MD     

## 2022-11-10 ENCOUNTER — Telehealth: Payer: Self-pay | Admitting: Cardiology

## 2022-11-10 DIAGNOSIS — M4722 Other spondylosis with radiculopathy, cervical region: Secondary | ICD-10-CM | POA: Diagnosis not present

## 2022-11-10 DIAGNOSIS — I2510 Atherosclerotic heart disease of native coronary artery without angina pectoris: Secondary | ICD-10-CM

## 2022-11-10 DIAGNOSIS — M542 Cervicalgia: Secondary | ICD-10-CM | POA: Diagnosis not present

## 2022-11-10 DIAGNOSIS — I6523 Occlusion and stenosis of bilateral carotid arteries: Secondary | ICD-10-CM

## 2022-11-10 DIAGNOSIS — E782 Mixed hyperlipidemia: Secondary | ICD-10-CM

## 2022-11-10 MED ORDER — REPATHA SURECLICK 140 MG/ML ~~LOC~~ SOAJ: 140.0000 mg | SUBCUTANEOUS | 1 refills | Status: DC

## 2022-11-10 NOTE — Telephone Encounter (Signed)
Pt c/o medication issue:  1. Name of Medication: Evolocumab (REPATHA SURECLICK) 140 MG/ML SOAJ   2. How are you currently taking this medication (dosage and times per day)?   3. Are you having a reaction (difficulty breathing--STAT)?   4. What is your medication issue? Patient needs this medication always sent to:   [facility] - [place], [place] - [address]   For a 90 day supply

## 2022-11-10 NOTE — Telephone Encounter (Signed)
Patient needed refill Repatha to her mail order pharmacy.  Resent and patient aware

## 2022-11-11 DIAGNOSIS — M542 Cervicalgia: Secondary | ICD-10-CM | POA: Diagnosis not present

## 2022-11-16 DIAGNOSIS — M5412 Radiculopathy, cervical region: Secondary | ICD-10-CM | POA: Diagnosis not present

## 2022-11-19 DIAGNOSIS — M5412 Radiculopathy, cervical region: Secondary | ICD-10-CM | POA: Diagnosis not present

## 2022-11-19 DIAGNOSIS — Z1331 Encounter for screening for depression: Secondary | ICD-10-CM | POA: Diagnosis not present

## 2022-11-19 DIAGNOSIS — Z1339 Encounter for screening examination for other mental health and behavioral disorders: Secondary | ICD-10-CM | POA: Diagnosis not present

## 2022-11-19 DIAGNOSIS — E1165 Type 2 diabetes mellitus with hyperglycemia: Secondary | ICD-10-CM | POA: Diagnosis not present

## 2022-11-27 DIAGNOSIS — M5412 Radiculopathy, cervical region: Secondary | ICD-10-CM | POA: Diagnosis not present

## 2022-12-15 DIAGNOSIS — M542 Cervicalgia: Secondary | ICD-10-CM | POA: Diagnosis not present

## 2022-12-21 DIAGNOSIS — E782 Mixed hyperlipidemia: Secondary | ICD-10-CM | POA: Diagnosis not present

## 2022-12-21 DIAGNOSIS — I2510 Atherosclerotic heart disease of native coronary artery without angina pectoris: Secondary | ICD-10-CM | POA: Diagnosis not present

## 2022-12-21 DIAGNOSIS — E1169 Type 2 diabetes mellitus with other specified complication: Secondary | ICD-10-CM | POA: Diagnosis not present

## 2022-12-21 DIAGNOSIS — Z0000 Encounter for general adult medical examination without abnormal findings: Secondary | ICD-10-CM | POA: Diagnosis not present

## 2023-01-04 ENCOUNTER — Encounter (HOSPITAL_COMMUNITY): Payer: Self-pay

## 2023-01-04 ENCOUNTER — Emergency Department (HOSPITAL_COMMUNITY): Payer: Medicare Other

## 2023-01-04 ENCOUNTER — Telehealth: Payer: Self-pay | Admitting: Cardiology

## 2023-01-04 ENCOUNTER — Emergency Department (HOSPITAL_COMMUNITY)
Admission: EM | Admit: 2023-01-04 | Discharge: 2023-01-04 | Disposition: A | Discharge status: Home / Self Care | Payer: Medicare Other | Attending: Emergency Medicine | Admitting: Emergency Medicine

## 2023-01-04 ENCOUNTER — Other Ambulatory Visit: Payer: Self-pay

## 2023-01-04 DIAGNOSIS — R0789 Other chest pain: Secondary | ICD-10-CM | POA: Diagnosis not present

## 2023-01-04 DIAGNOSIS — J189 Pneumonia, unspecified organism: Secondary | ICD-10-CM

## 2023-01-04 DIAGNOSIS — R0781 Pleurodynia: Secondary | ICD-10-CM | POA: Diagnosis present

## 2023-01-04 DIAGNOSIS — J181 Lobar pneumonia, unspecified organism: Secondary | ICD-10-CM | POA: Diagnosis not present

## 2023-01-04 DIAGNOSIS — I10 Essential (primary) hypertension: Secondary | ICD-10-CM | POA: Diagnosis not present

## 2023-01-04 DIAGNOSIS — Z7984 Long term (current) use of oral hypoglycemic drugs: Secondary | ICD-10-CM | POA: Diagnosis not present

## 2023-01-04 DIAGNOSIS — E119 Type 2 diabetes mellitus without complications: Secondary | ICD-10-CM | POA: Diagnosis not present

## 2023-01-04 DIAGNOSIS — J168 Pneumonia due to other specified infectious organisms: Secondary | ICD-10-CM | POA: Diagnosis not present

## 2023-01-04 DIAGNOSIS — Z951 Presence of aortocoronary bypass graft: Secondary | ICD-10-CM | POA: Insufficient documentation

## 2023-01-04 DIAGNOSIS — Z7982 Long term (current) use of aspirin: Secondary | ICD-10-CM | POA: Insufficient documentation

## 2023-01-04 DIAGNOSIS — R079 Chest pain, unspecified: Secondary | ICD-10-CM | POA: Diagnosis not present

## 2023-01-04 DIAGNOSIS — I2510 Atherosclerotic heart disease of native coronary artery without angina pectoris: Secondary | ICD-10-CM | POA: Insufficient documentation

## 2023-01-04 LAB — BASIC METABOLIC PANEL
Anion gap: 16 — ABNORMAL HIGH (ref 5–15)
BUN: 15 mg/dL (ref 8–23)
CO2: 23 mmol/L (ref 22–32)
Calcium: 10.3 mg/dL (ref 8.9–10.3)
Chloride: 104 mmol/L (ref 98–111)
Creatinine, Ser: 0.87 mg/dL (ref 0.44–1.00)
GFR, Estimated: >60 mL/min (ref >60)
Glucose, Bld: 123 mg/dL — ABNORMAL HIGH (ref 70–99)
Potassium: 4.1 mmol/L (ref 3.5–5.1)
Sodium: 143 mmol/L (ref 135–145)

## 2023-01-04 LAB — CBC
HCT: 40.7 % (ref 36.0–46.0)
Hemoglobin: 13.3 g/dL (ref 12.0–15.0)
MCH: 29.5 pg (ref 26.0–34.0)
MCHC: 32.7 g/dL (ref 30.0–36.0)
MCV: 90.2 fL (ref 80.0–100.0)
Platelets: 202 10*3/uL (ref 150–400)
RBC: 4.51 MIL/uL (ref 3.87–5.11)
RDW: 12.7 % (ref 11.5–15.5)
WBC: 9.1 10*3/uL (ref 4.0–10.5)
nRBC: 0 % (ref 0.0–0.2)

## 2023-01-04 LAB — TROPONIN I (HIGH SENSITIVITY)
Troponin I (High Sensitivity): 5 ng/L (ref <18)
Troponin I (High Sensitivity): 6 ng/L (ref <18)

## 2023-01-04 MED ORDER — DOXYCYCLINE HYCLATE 100 MG PO CAPS: 100.0000 mg | ORAL_CAPSULE | Freq: Two times a day (BID) | ORAL | 0 refills | Status: AC

## 2023-01-04 MED ORDER — IOHEXOL 350 MG/ML SOLN
75.0000 mL | Freq: Once | INTRAVENOUS | Status: AC | PRN
  Administered 2023-01-04: 75 mL via INTRAVENOUS

## 2023-01-04 MED ORDER — SODIUM CHLORIDE 0.9 % IV SOLN
1.0000 g | Freq: Once | INTRAVENOUS | Status: AC
  Administered 2023-01-04: 1 g via INTRAVENOUS
  Filled 2023-01-04: qty 10

## 2023-01-04 MED ORDER — DOXYCYCLINE HYCLATE 100 MG PO TABS
100.0000 mg | ORAL_TABLET | Freq: Once | ORAL | Status: AC
  Administered 2023-01-04: 100 mg via ORAL
  Filled 2023-01-04: qty 1

## 2023-01-04 MED ORDER — ASPIRIN 325 MG PO TBEC
325.0000 mg | DELAYED_RELEASE_TABLET | Freq: Once | ORAL | Status: AC
  Administered 2023-01-04: 325 mg via ORAL
  Filled 2023-01-04: qty 1

## 2023-01-04 NOTE — ED Provider Triage Note (Signed)
Emergency Medicine Provider Triage Evaluation Note  Faith Gaines , a 77 y.o. female  was evaluated in triage.  Pt complains of chest tightness underneath her left breast which wrapped around her left side that started after her 8am walk. Accompanied by nausea and dizziness. States this was similar to chest pains she had before she got her CABG. Not currently symptomatic. No arm pain, abdominal pain, jaw pain. Also notes more swelling in left ankle for a couple months   H/o CABG x3 in 2021    Review of Systems  Positive: Per above   Negative: Per above     Physical Exam  BP (!) 188/90 (BP Location: Left Arm)   Pulse 71   Temp 97.8 F (36.6 C) (Oral)   Resp 16   SpO2 100%  Gen:   Awake, no distress   Resp:  Normal effort  MSK:   Moves extremities without difficulty  Other:  Regular rate and rhythm  Medical Decision Making  Medically screening exam initiated at 12:48 PM.  Appropriate orders placed.  Fredrich Romans Agostinelli was informed that the remainder of the evaluation will be completed by another provider, this initial triage assessment does not replace that evaluation, and the importance of remaining in the ED until their evaluation is complete.     Arabella Merles, PA-C 01/04/23 1301

## 2023-01-04 NOTE — Telephone Encounter (Signed)
Patient states solid pain, but hits me and then subsides.  She is very weak. She stated she when for a walk this morning for about an hour. She came in and felt dizzy and nauseas.  She sat to rest and then have had a solid pain in back and under left arm and also under Left breast. She had Bypass 3 years ago.  She took her aspirin as normal scheduled this morning.  Advised she go to ED to be evaluated.  She has family and they will take her now.

## 2023-01-04 NOTE — Telephone Encounter (Signed)
Pt c/o of Chest Pain: STAT if CP now or developed within 24 hours  1. Are you having CP right now?  Yes   2. Are you experiencing any other symptoms (ex. SOB, nausea, vomiting, sweating)?  Weakness, Patient states she went walking this morning. When she got home she felt dizzy and sick to her stomach. Under left breast, arm and left-sided back pain.  3. How long have you been experiencing CP?  Started this morning  4. Is your CP continuous or coming and going?  Continuous   5. Have you taken Nitroglycerin?   Patient states she does not have any nitro

## 2023-01-04 NOTE — ED Triage Notes (Signed)
Pt came in via POV d/t a sudden onset of dizziness & Lt sided CP under her Lt breast that radiates into her back, also feels nauseated. Hx of CABG, rates pain 10/10, A/Ox4.

## 2023-01-04 NOTE — Telephone Encounter (Signed)
Patient is currently in the ER. 

## 2023-01-04 NOTE — ED Provider Notes (Signed)
[place] EMERGENCY DEPARTMENT AT [facility] Provider Note   CSN: [id number] Arrival date & time: 01/04/23  1151     History {Add pertinent medical, surgical, social history, OB history to HPI:1} Chief Complaint  Patient presents with   Chest Pain   Nausea    Faith Gaines is a 77 y.o. female with history of coronary disease status post CABG, peripheral artery disease, carotid disease, hypertension, hyperlipidemia, diabetes, presented to ED with complaint of chest pain and back pain and pleuritic chest discomfort.  Patient reports that she went on an approximate 1 hour walk today which was not unusual for her.  He drove home to her house, and upon getting home she been having discomfort in the left side of her chest, left lateral chest wall as well as in her left shoulder.  She felt mildly nauseated with it.  She says she has had these chest pains before, prior to her bypass, but not since her bypass.  Since coming into the ED she says she her symptoms have nearly completely resolved.  She denies lightheadedness.  She reports continues to have some mild pleuritic left-sided chest pain.  She does report that a few weeks ago she noted that her left leg appeared "more swollen around the ankle".  She denies history of DVT or PE.  She is not on anticoagulation.  She does take aspirin daily.  LHC last July 2021 with severe three-vessel coronary disease, prior to plan for CABG with CT surgery   HPI     Home Medications Prior to Admission medications   Medication Sig Start Date End Date Taking? Authorizing Provider  acetaminophen (TYLENOL) 325 MG tablet Take 650 mg by mouth every 6 (six) hours as needed for mild pain, moderate pain, fever or headache.    [provider]  aspirin EC 81 MG tablet Take 81 mg by mouth daily.    [provider]  Biotin 5000 MCG TABS Take 1,000 mcg by mouth daily.    [provider]  Cholecalciferol (VITAMIN D3) 50  MCG (2000 UT) TABS Take 2,000 Units by mouth daily.    [provider]  Evolocumab (REPATHA SURECLICK) 140 MG/ML SOAJ Inject 140 mg into the skin every 14 (fourteen) days. 11/10/22   Rollene Rotunda, MD  famotidine (PEPCID) 40 MG tablet Take 40 mg by mouth at bedtime.    [provider]  gabapentin (NEURONTIN) 300 MG capsule Take 1 capsule (300 mg total) by mouth 3 (three) times daily. Patient taking differently: Take 300 mg by mouth 4 (four) times daily. 10/03/21   Dellia Beckwith, MD  HYDROcodone-acetaminophen (NORCO/VICODIN) 5-325 MG tablet Take 1 tablet by mouth every 6 (six) hours as needed for moderate pain or severe pain. 08/27/22 08/27/23  McKenzie, Eilene Ghazi, PA-C  metFORMIN (GLUCOPHAGE-XR) 500 MG 24 hr tablet Take 500 mg by mouth at bedtime. 09/15/21   [provider]  methocarbamol (ROBAXIN) 750 MG tablet Take 1 tablet (750 mg total) by mouth every 6 (six) hours as needed for muscle spasms. 08/27/22   McKenzie, Eilene Ghazi, PA-C  Multiple Vitamins-Minerals (PRESERVISION AREDS 2 PO) Take 1 capsule by mouth daily. Patient not taking: Reported on 11/09/2022    [provider]  nebivolol (BYSTOLIC) 5 MG tablet Take 5 mg by mouth daily.    [provider]  Nebivolol HCl (BYSTOLIC) 20 MG TABS Take 20 mg by mouth daily.    [provider]  Omega-3 Fatty Acids (FISH OIL)  1000 MG CAPS Take 1,000 mg by mouth daily.    [provider]  ondansetron (ZOFRAN) 4 MG tablet Take 1 tablet (4 mg total) by mouth every 8 (eight) hours as needed for nausea or vomiting. Patient not taking: Reported on 11/09/2022 08/27/22   Georga Bora, PA-C  pantoprazole (PROTONIX) 40 MG tablet Take 40 mg by mouth daily.    [provider]  promethazine (PHENERGAN) 25 MG tablet Take 25 mg by mouth every 6 (six) hours as needed for nausea or vomiting. Patient not taking: Reported on 11/09/2022    [provider]  Semaglutide,0.25 or 0.5MG /DOS, (OZEMPIC,  0.25 OR 0.5 MG/DOSE,) 2 MG/1.5ML SOPN Inject 0.5 mg into the skin once a week.    [provider]  vitamin B-12 (CYANOCOBALAMIN) 500 MCG tablet Take 500 mcg by mouth daily.    [provider]      Allergies    Codeine, Ace inhibitors, Atorvastatin, Biaxin [clarithromycin], Crestor [rosuvastatin], Iodine, Plaquenil [hydroxychloroquine sulfate], Avandamet [rosiglitazone-metformin], and Betadine [povidone iodine]    Review of Systems   Review of Systems  Physical Exam Updated Vital Signs BP (!) 160/84 (BP Location: Right Arm)   Pulse 64   Temp 97.8 F (36.6 C) (Oral)   Resp 16   SpO2 100%  Physical Exam Constitutional:      General: She is not in acute distress. HENT:     Head: Normocephalic and atraumatic.  Eyes:     Conjunctiva/sclera: Conjunctivae normal.     Pupils: Pupils are equal, round, and reactive to light.  Cardiovascular:     Rate and Rhythm: Normal rate and regular rhythm.  Pulmonary:     Effort: Pulmonary effort is normal. No respiratory distress.  Abdominal:     General: There is no distension.     Tenderness: There is no abdominal tenderness.  Musculoskeletal:     Right lower leg: No edema.     Left lower leg: No edema.  Skin:    General: Skin is warm and dry.  Neurological:     General: No focal deficit present.     Mental Status: She is alert. Mental status is at baseline.  Psychiatric:        Mood and Affect: Mood normal.        Behavior: Behavior normal.     ED Results / Procedures / Treatments   Labs (all labs ordered are listed, but only abnormal results are displayed) Labs Reviewed  BASIC METABOLIC PANEL - Abnormal; Notable for the following components:      Result Value   Glucose, Bld 123 (*)    Anion gap 16 (*)    All other components within normal limits  CBC  TROPONIN I (HIGH SENSITIVITY)  TROPONIN I (HIGH SENSITIVITY)    EKG EKG Interpretation  Date/Time:  Monday January 04 2023 11:53:35 EDT Ventricular Rate:   71 PR Interval:  162 QRS Duration: 88 QT Interval:  416 QTC Calculation: 452 R Axis:   -84 Text Interpretation: Normal sinus rhythm Left axis deviation When compared with ECG of 04-Sep-2022 09:50, PREVIOUS ECG IS PRESENT No sig change Confirmed by Alvester Chou [phone]) on 01/04/2023 3:04:33 PM  Radiology DG Chest 2 View  Result Date: 01/04/2023 CLINICAL DATA:  Chest pain EXAM: CHEST - 2 VIEW COMPARISON:  Chest radiograph dated 09/04/2022 FINDINGS: Normal lung volumes. No focal consolidations. No pleural effusion or pneumothorax. The heart size and mediastinal contours are within normal limits. Median sternotomy wires are nondisplaced. Cervical spinal  fixation hardware appears intact. Surgical clips project over the mediastinum, left breast, and bilateral upper quadrants. IMPRESSION: No active cardiopulmonary disease. Electronically Signed   By: Agustin Cree M.D.   On: 01/04/2023 14:32    Procedures Procedures  {Document cardiac monitor, telemetry assessment procedure when appropriate:1}  Medications Ordered in ED Medications  aspirin EC tablet 325 mg (325 mg Oral Given 01/04/23 1300)    ED Course/ Medical Decision Making/ A&P   {   Click here for ABCD2, HEART and other calculatorsREFRESH Note before signing :1}                          Medical Decision Making Amount and/or Complexity of Data Reviewed Labs: ordered. Radiology: ordered.   This patient presents to the Emergency Department with complaint of chest pain. This involves an extensive number of treatment options, and is a complaint that carries with it a high risk of complications and morbidity, given the patient's comorbidity, including HTN, HLD, CAD .The differential diagnosis includes ACS vs Pneumothorax vs Reflux/Gastritis vs MSK pain vs Pneumonia vs other.  PE on differential with acute onset symptoms, pleuritic component and reported leg swelling recently (although I cannot appreciate unilateral swelling on my exam).   Plan for CT PE  I felt PE was less likely given that ***  I ordered, reviewed, and interpreted labs.  Pertinent results include no emergent findings.  There is a very minor anion gap which may be related to a bit of dehydration, but blood sugar 123, no leukocytosis, no acute anemia.  Troponin 5 --> *** Aspirin was given in triage.  Patient was having very minimal chest discomfort in my exam and not requiring further medications. I ordered imaging studies which included dg chest, CTPE  I independently visualized and interpreted imaging which showed *** and the monitor tracing which showed *** . I agree with the radiologist interpretation External records obtained and reviewed showing [facility] report I personally reviewed the patients ECG which showed sinus rhythm with no acute ischemic findings  After the interventions stated above, I reevaluated the patient and found that they were ***  Based on the patient's clinical exam, vital signs, risk factors, and ED testing, I felt that the patient's overall risk of life-threatening emergency such as ACS, PE, sepsis, or infection was low.  At this time, I felt the patient's presentation was most clinically consistent with ***, but explained to the patient that this evaluation was not a definitive diagnostic workup.  I discussed outpatient follow up with primary care provider, and provided specialist office number on the patient's discharge paper if a referral was deemed necessary.  Return precautions were discussed with the patient.  I felt the patient was clinically stable for discharge.   {Document critical care time when appropriate:1} {Document review of labs and clinical decision tools ie heart score, Chads2Vasc2 etc:1}  {Document your independent review of radiology images, and any outside records:1} {Document your discussion with family members, caretakers, and with consultants:1} {Document social determinants of health affecting pt's  care:1} {Document your decision making why or why not admission, treatments were needed:1} Final Clinical Impression(s) / ED Diagnoses Final diagnoses:  None    Rx / DC Orders ED Discharge Orders     None

## 2023-01-11 ENCOUNTER — Telehealth: Payer: Self-pay

## 2023-01-11 NOTE — Telephone Encounter (Signed)
Transition Care Management Unsuccessful Follow-up Telephone Call  Date of discharge and from where:  [facility]  617  Attempts:  1st Attempt  Reason for unsuccessful TCM follow-up call:  Left voice message   Lenard Forth [facility] Guide, [facility] [phone] [address] [place], [place], [place] [address] Phone: [phone] Email: [email or web address]

## 2023-01-12 ENCOUNTER — Telehealth: Payer: Self-pay

## 2023-01-12 NOTE — Telephone Encounter (Signed)
Transition Care Management Unsuccessful Follow-up Telephone Call  Date of discharge and from where:  [facility] 6/17  Attempts:  2nd  Reason for unsuccessful TCM follow-up call:  No answer/busy   Lenard Forth [facility] Guide, [facility] [phone] [address] [place], [place], [place] [address] Phone: [phone] Email: [email or web address]

## 2023-01-19 DIAGNOSIS — T364X5A Adverse effect of tetracyclines, initial encounter: Secondary | ICD-10-CM | POA: Diagnosis not present

## 2023-01-19 DIAGNOSIS — Z6821 Body mass index (BMI) 21.0-21.9, adult: Secondary | ICD-10-CM | POA: Diagnosis not present

## 2023-01-19 DIAGNOSIS — J189 Pneumonia, unspecified organism: Secondary | ICD-10-CM | POA: Diagnosis not present

## 2023-02-22 DIAGNOSIS — I2510 Atherosclerotic heart disease of native coronary artery without angina pectoris: Secondary | ICD-10-CM | POA: Diagnosis not present

## 2023-02-22 DIAGNOSIS — Z6821 Body mass index (BMI) 21.0-21.9, adult: Secondary | ICD-10-CM | POA: Diagnosis not present

## 2023-02-22 DIAGNOSIS — E782 Mixed hyperlipidemia: Secondary | ICD-10-CM | POA: Diagnosis not present

## 2023-02-22 DIAGNOSIS — E1169 Type 2 diabetes mellitus with other specified complication: Secondary | ICD-10-CM | POA: Diagnosis not present

## 2023-03-17 DIAGNOSIS — L219 Seborrheic dermatitis, unspecified: Secondary | ICD-10-CM | POA: Diagnosis not present

## 2023-03-17 DIAGNOSIS — L570 Actinic keratosis: Secondary | ICD-10-CM | POA: Diagnosis not present

## 2023-03-17 DIAGNOSIS — Z9889 Other specified postprocedural states: Secondary | ICD-10-CM | POA: Diagnosis not present

## 2023-04-06 ENCOUNTER — Other Ambulatory Visit: Payer: Self-pay | Admitting: Oncology

## 2023-04-06 DIAGNOSIS — Z1231 Encounter for screening mammogram for malignant neoplasm of breast: Secondary | ICD-10-CM

## 2023-04-20 ENCOUNTER — Telehealth: Payer: Self-pay | Admitting: Pharmacist

## 2023-04-20 DIAGNOSIS — E782 Mixed hyperlipidemia: Secondary | ICD-10-CM

## 2023-04-20 MED ORDER — REPATHA SURECLICK 140 MG/ML ~~LOC~~ SOAJ: 140.0000 mg | SUBCUTANEOUS | 3 refills | Status: DC

## 2023-04-20 NOTE — Telephone Encounter (Signed)
Patient call requesting refills on Repatha. Prescription sent to [facility] champ 3 months with 3 refills. Reports her lipid lab done at PCP but does not know the result.

## 2023-05-24 ENCOUNTER — Ambulatory Visit
Admission: RE | Admit: 2023-05-24 | Discharge: 2023-05-24 | Disposition: A | Discharge status: Home / Self Care | Payer: Medicare Other | Source: Ambulatory Visit | Attending: Oncology | Admitting: Oncology

## 2023-05-24 DIAGNOSIS — Z1231 Encounter for screening mammogram for malignant neoplasm of breast: Secondary | ICD-10-CM

## 2023-05-27 NOTE — Progress Notes (Incomplete)
[facility]  [address] [place],  [place]  [address] [phone]  Clinic Day:05/27/22  Referring physician: Lise Auer, MD   CHIEF COMPLAINT:  CC: History of stage 0 left breast cancer  Current Treatment:  Surveillance  HISTORY OF PRESENT ILLNESS:  Faith Gaines is a 77 y.o. female with a history of stage 0 left breast cancer diagnosed in November of 2014 and treated with a lumpectomy in February of 2015. 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  This measured 0.1 cm for a Tis N0 N0.  She was offered chemoprevention but declined because of many other comorbidities.  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  She does have osteopenia and her last bone density scan was done in January of 2015. She gets these done through her gynecologist in [place].  In April of 2016, she had a total abdominal hysterectomy and bilateral salpingo-oophorectomy.  She did have a iron deficiency anemia in the past, but this corrected.  She tells me that she met with her primary care physician in December of 2016, and she was anemic again with a hemoglobin down to 11.  She was found to have iron deficiency.  She did complain of occasional soreness of her tongue, dizziness, and craving for ice.  She was placed on slow release iron after she was not able to tolerate ferrous sulfate.  She was fatigued and weak with dyspnea on exertion, and had daily headaches as well.  She had severe bleeding from the rectum which she attributes to hemorrhoids. Studies still showed evidence of iron deficiency and so she was given 2 injections of Feraheme at the end of December  2016, 1 week apart.  She has had endoscopy and colonoscopy through Dr. Loreta Ave in [place] several years ago, so she was referred back, and had these repeated in January of 2017.  EGD revealed diffuse moderate gastritis  and colonoscopy revealed hemorrhoids.  She was told to avoid all nonsteroidal anti-inflammatory medications.    She has been diagnosed with lichen planopilaris and is using a liquid steroid from the dermatologist in [place], and topical Rogaine.  They referred her to [facility], and they felt this was immune mediated and placed her on hydroxychloroquine 200 mg twice daily.  She feels this is helping some.  She had a biopsy in the past of her right upper arm by a dermatologist in [place], Dr. Sharyn Lull, and was told this was "pre melanoma".  She had a bone density scan 2 years ago, which apparently was not as good as previous.    She underwent triple bypass surgery in June 2021 and her hemoglobin was 9.0 postop.  They took veins from her left leg and left chest.  Since her surgery, she has had pain within that left breast and along the sternal incision.  When she was seen in September 2021, she was still having a lot of post-thoracotomy pain and so I placed her on a low dose of Elavil 25 mg.  She reported drowsiness.  Dr. Cliffton Asters placed her on Lyrica 25 mg BID.  CT chest from January 2022 revealed mild emphysematous changes but no acute pulmonary findings or worrisome pulmonary lesions.  No mediastinal or hilar mass or adenopathy was observed.  I saw her in December 2022 and she was on gabapentin 100 mg twice daily without benefit.  I  therefore recommended a slow increase in the dose and she is now up to 300 mg twice daily with 3 pills at bedtime.  INTERVAL HISTORY:  Faith Gaines is here for follow up of her postthoracotomy pain syndrome and history of stage 0 breast cancer. Patient states that she feels *** and ***.    She denies signs of infection such as sore throat, sinus drainage, cough, or urinary symptoms.  She denies fevers or recurrent chills. She denies pain. She denies nausea, vomiting, chest pain, dyspnea or cough. Her appetite is *** and her weight {Weight change:10426}.  Her  pain is well controlled at this time on Gabapentin. She received a series of cortisone injections into her sternotomy incision. This was very painful but she had good relief. She reports temporary pain relief with receiving an US guided cortisone injection to her left shoulder. Her pain intermittently radiates from her neck down to her hand. Her shoulder pain is worsened when lying down on her left side. She notes that her RTC was intact on a recent left shoulder MRI but she has not yet had her neck evaluated. She states that she has not had labs since September 2023. She states that her CBC was WNL to her knowledge but she would like to repeat a CMP and CBC today. She recently changed primary care providers to Dr. Welton Flakes. Annual bilateral mammogram from November of 2023 was clear. She is seeing her cardiologist routinely and states that she is doing well from a CV perspective. She continues to have hair loss. 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 Her appetite is good, and her weight is down 2 pounds since 03/17/22. She denies fever, chills or other signs of infection. She denies nausea, vomiting, or abdominal pain. She denies sore throat, cough, dyspnea, or hemoptysis.   REVIEW OF SYSTEMS:  Review of Systems  Constitutional: Negative.  Negative for appetite change, chills, diaphoresis, fatigue, fever and unexpected weight change.  HENT:  Negative.  Negative for hearing loss, lump/mass, mouth sores, nosebleeds, sore throat, tinnitus, trouble swallowing and voice change.        + hair loss  Eyes: Negative.  Negative for eye problems and icterus.  Respiratory: Negative.  Negative for chest tightness, cough, hemoptysis, shortness of breath and wheezing.   Cardiovascular: Negative.  Negative for chest pain, leg swelling and palpitations.  Gastrointestinal: Negative.  Negative for abdominal distention, abdominal pain, blood in stool,  constipation, diarrhea, nausea, rectal pain and vomiting.  Endocrine: Negative.   Genitourinary: Negative.  Negative for bladder incontinence, difficulty urinating, dyspareunia, dysuria, frequency, hematuria, menstrual problem, nocturia, pelvic pain, vaginal bleeding and vaginal discharge.   Musculoskeletal:  Positive for arthralgias (left shoulder), myalgias and neck pain. Negative for back pain, flank pain, gait problem and neck stiffness.  Skin: Negative.  Negative for itching, rash and wound.  Neurological:  Negative for dizziness, extremity weakness, gait problem, headaches, light-headedness, numbness, seizures and speech difficulty.  Hematological: Negative.  Negative for adenopathy. Does not bruise/bleed easily.  Psychiatric/Behavioral: Negative.  Negative for confusion, decreased concentration, depression, sleep disturbance and suicidal ideas. The patient is not nervous/anxious.      VITALS:  There were no vitals taken for this visit.  Wt Readings from Last 3 Encounters:  11/09/22 111 lb 12.8 oz (50.7 kg)  09/04/22 106 lb (48.1 kg)  08/27/22 114 lb 4.8 oz (51.8 kg)    There is no height or weight on file  to calculate BMI.  Performance status (ECOG): 1 - Symptomatic but completely ambulatory  PHYSICAL EXAM:  Physical Exam Vitals and nursing note reviewed. Exam conducted with a chaperone present.  Constitutional:      General: She is not in acute distress.    Appearance: Normal appearance. She is normal weight. She is not ill-appearing, toxic-appearing or diaphoretic.  HENT:     Head: Normocephalic and atraumatic.     Comments: alopecia areata     Right Ear: Tympanic membrane, ear canal and external ear normal. There is no impacted cerumen.     Left Ear: Tympanic membrane, ear canal and external ear normal. There is no impacted cerumen.     Nose: Nose normal. No congestion or rhinorrhea.     Mouth/Throat:     Mouth: Mucous membranes are moist.     Pharynx: Oropharynx is  clear. No oropharyngeal exudate or posterior oropharyngeal erythema.  Eyes:     General: No scleral icterus.       Right eye: No discharge.        Left eye: No discharge.     Extraocular Movements: Extraocular movements intact.     Conjunctiva/sclera: Conjunctivae normal.     Pupils: Pupils are equal, round, and reactive to light.  Neck:     Vascular: No carotid bruit.  Cardiovascular:     Rate and Rhythm: Normal rate and regular rhythm.     Pulses: Normal pulses.     Heart sounds: Normal heart sounds. No murmur heard.    No friction rub. No gallop.  Pulmonary:     Effort: Pulmonary effort is normal. No respiratory distress.     Breath sounds: Normal breath sounds. No stridor. No wheezing, rhonchi or rales.  Chest:     Chest wall: No tenderness.  Breasts:    Right: Normal.     Left: Normal.     Comments: Sternotomy incision with a well healed thick scar. Nodular area at inferior aspect.  Well-healed scar over right axilla. Abdominal:     General: Bowel sounds are normal. There is no distension.     Palpations: Abdomen is soft. There is no hepatomegaly, splenomegaly or mass.     Tenderness: There is no abdominal tenderness. There is no right CVA tenderness, left CVA tenderness, guarding or rebound.     Hernia: No hernia is present.  Musculoskeletal:        General: No swelling, tenderness, deformity or signs of injury. Normal range of motion.     Cervical back: Normal range of motion and neck supple. No rigidity or tenderness.     Right lower leg: No edema.     Left lower leg: No edema.  Lymphadenopathy:     Cervical: No cervical adenopathy.  Skin:    General: Skin is warm and dry.     Coloration: Skin is not jaundiced or pale.     Findings: No bruising, erythema, lesion or rash.  Neurological:     General: No focal deficit present.     Mental Status: She is alert and oriented to person, place, and time. Mental status is at baseline.     Cranial Nerves: No cranial nerve  deficit.     Sensory: No sensory deficit.     Motor: No weakness.     Coordination: Coordination normal.     Gait: Gait normal.     Deep Tendon Reflexes: Reflexes normal.  Psychiatric:        Mood and Affect: Mood normal.  Behavior: Behavior normal.        Thought Content: Thought content normal.        Judgment: Judgment normal.    \ LABS:      Latest Ref Rng & Units 01/04/2023   12:44 PM 09/05/2022    3:52 AM 09/04/2022   11:34 AM  CBC  WBC 4.0 - 10.5 K/uL 9.1  6.1    Hemoglobin 12.0 - 15.0 g/dL 60.4  54.0  98.1   Hematocrit 36.0 - 46.0 % 40.7  36.5  45.0   Platelets 150 - 400 K/uL 202  271        Latest Ref Rng & Units 01/04/2023   12:44 PM 09/07/2022    4:31 AM 09/06/2022    4:33 AM  CMP  Glucose 70 - 99 mg/dL 191  95  478   BUN 8 - 23 mg/dL 15  <5  5   Creatinine 0.44 - 1.00 mg/dL 2.95  6.21  3.08   Sodium 135 - 145 mmol/L 143  141  140   Potassium 3.5 - 5.1 mmol/L 4.1  3.6  3.0   Chloride 98 - 111 mmol/L 104  106  110   CO2 22 - 32 mmol/L 23  26  24    Calcium 8.9 - 10.3 mg/dL 65.7  9.7  9.6    Lab Results  Component Value Date   TIBC 372 05/29/2020   FERRITIN 99 05/29/2020   IRONPCTSAT 20 05/29/2020   Lab Results  Component Value Date   HGBA1C 7.8 (H) 08/20/2022    Component Ref Range & Units 4 mo ago (01/04/23) 4 mo ago (01/04/23)  Troponin I (High Sensitivity) <18 ng/L 6 5 CM   STUDIES:   MM 3D SCREENING MAMMOGRAM BILATERAL BREAST  Result Date: 05/26/2023 CLINICAL DATA:  Screening. EXAM: DIGITAL SCREENING BILATERAL MAMMOGRAM WITH TOMOSYNTHESIS AND CAD TECHNIQUE: Bilateral screening digital craniocaudal and mediolateral oblique mammograms were obtained. Bilateral screening digital breast tomosynthesis was performed. The images were evaluated with computer-aided detection. COMPARISON:  Previous exam(s). ACR Breast Density Category c: The breasts are heterogeneously dense, which may obscure small masses. FINDINGS: There are no findings suspicious for  malignancy. IMPRESSION: No mammographic evidence of malignancy. A result letter of this screening mammogram will be mailed directly to the patient. RECOMMENDATION: Screening mammogram in one year. (Code:SM-B-01Y) BI-RADS CATEGORY  1: Negative. Electronically Signed   By: Sherian Rein M.D.   On: 05/26/2023 13:41     Allergies:  Allergies  Allergen Reactions   Codeine Nausea And Vomiting and Other (See Comments)    Headaches    Ace Inhibitors Other (See Comments)    Drop in GFR    Atorvastatin     Body aches Other reaction(s): body aches Other reaction(s): body aches   Biaxin [Clarithromycin] Other (See Comments)    Insomnia    Crestor [Rosuvastatin] Other (See Comments)    Myalgias    Iodine Other (See Comments)    Fungus    Plaquenil [Hydroxychloroquine Sulfate] Itching   Avandamet [Rosiglitazone-Metformin] Rash and Other (See Comments)    Hyperactivity    Betadine [Povidone Iodine] Rash    Current Medications: Current Outpatient Medications  Medication Sig Dispense Refill   acetaminophen (TYLENOL) 325 MG tablet Take 650 mg by mouth every 6 (six) hours as needed for mild pain, moderate pain, fever or headache.     aspirin EC 81 MG tablet Take 81 mg by mouth daily.     Biotin 5000 MCG TABS Take 1,000  mcg by mouth daily.     Cholecalciferol (VITAMIN D3) 50 MCG (2000 UT) TABS Take 2,000 Units by mouth daily.     Evolocumab (REPATHA SURECLICK) 140 MG/ML SOAJ Inject 140 mg into the skin every 14 (fourteen) days. 6 mL 3   famotidine (PEPCID) 40 MG tablet Take 40 mg by mouth at bedtime.     gabapentin (NEURONTIN) 300 MG capsule Take 1 capsule (300 mg total) by mouth 3 (three) times daily. (Patient taking differently: Take 300 mg by mouth 4 (four) times daily.) 270 capsule 2   HYDROcodone-acetaminophen (NORCO/VICODIN) 5-325 MG tablet Take 1 tablet by mouth every 6 (six) hours as needed for moderate pain or severe pain. 20 tablet 0   metFORMIN (GLUCOPHAGE-XR) 500 MG 24 hr tablet Take  500 mg by mouth at bedtime.     methocarbamol (ROBAXIN) 750 MG tablet Take 1 tablet (750 mg total) by mouth every 6 (six) hours as needed for muscle spasms. 60 tablet 0   Multiple Vitamins-Minerals (PRESERVISION AREDS 2 PO) Take 1 capsule by mouth daily. (Patient not taking: Reported on 11/09/2022)     nebivolol (BYSTOLIC) 5 MG tablet Take 5 mg by mouth daily.     Nebivolol HCl (BYSTOLIC) 20 MG TABS Take 20 mg by mouth daily.     Omega-3 Fatty Acids (FISH OIL) 1000 MG CAPS Take 1,000 mg by mouth daily.     ondansetron (ZOFRAN) 4 MG tablet Take 1 tablet (4 mg total) by mouth every 8 (eight) hours as needed for nausea or vomiting. (Patient not taking: Reported on 11/09/2022) 20 tablet 0   pantoprazole (PROTONIX) 40 MG tablet Take 40 mg by mouth daily.     promethazine (PHENERGAN) 25 MG tablet Take 25 mg by mouth every 6 (six) hours as needed for nausea or vomiting. (Patient not taking: Reported on 11/09/2022)     Semaglutide,0.25 or 0.5MG /DOS, (OZEMPIC, 0.25 OR 0.5 MG/DOSE,) 2 MG/1.5ML SOPN Inject 0.5 mg into the skin once a week.     vitamin B-12 (CYANOCOBALAMIN) 500 MCG tablet Take 500 mcg by mouth daily.     No current facility-administered medications for this visit.     ASSESSMENT & PLAN:  Assessment:  1. Stage 0 breast cancer November 2014, treated with lumpectomy.  She has no evidence of disease.  2. 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 Resuming her Rogaine.  3. Hypercalcemia, stable, with normal vitamin-D level.   4. History of malignant melanoma resected from her left leg at age 67. No evidence of disease.  5. History of "pre melanoma" of her right upper arm resected.  The pathology revealed an inflamed junctional nevus with moderate atypia.  6. Osteopenia. She is taking oral vitamin D.  7. History of total abdominal hysterectomy and bilateral salpingo-oophorectomy.  8.  Triple bypass surgery in June 2021.  She completed cardiac rehab.  I believe her pain is  postoperative/neuropathic in nature, consistent with postthoracotomy syndrome.  She is now on gabapentin at 300 mg twice daily and 3 pills at bedtime with fair relief.  9.  Left shoulder surgery for a torn tendon, May 2022.  She underwent rehab. Continues to have pain in the left shoulder. Had a recent left shoulder MRI which was unremarkable.  10. Severe pain of the left upper extremity. We suspect that this radiculopathy from the cervical spine. She will follow up with her surgeon.  Plan: She will continue the gabapentin for her postthoracotomy pain syndrome. Annual bilateral mammogram was benign. Will repeat bilateral screening  mammogram prior to her annual visit with me. She understands and agrees with this plan of care.   I provided 20 minutes of face-to-face time during this this encounter and > 50% was spent counseling as documented under my assessment and plan.    Dellia Beckwith, MD [facility] [address] [place] [address] Dept: [phone] Dept Fax: [phone]    Rulon Sera Lassiter,acting as a scribe for Dellia Beckwith, MD.,have documented all relevant documentation on the behalf of Dellia Beckwith, MD,as directed by  Dellia Beckwith, MD while in the presence of Dellia Beckwith, MD.

## 2023-05-28 ENCOUNTER — Ambulatory Visit: Payer: Medicare Other | Admitting: Oncology

## 2023-06-04 DIAGNOSIS — Z79899 Other long term (current) drug therapy: Secondary | ICD-10-CM | POA: Diagnosis not present

## 2023-06-04 DIAGNOSIS — Z6820 Body mass index (BMI) 20.0-20.9, adult: Secondary | ICD-10-CM | POA: Diagnosis not present

## 2023-06-04 DIAGNOSIS — E1169 Type 2 diabetes mellitus with other specified complication: Secondary | ICD-10-CM | POA: Diagnosis not present

## 2023-06-04 DIAGNOSIS — I2510 Atherosclerotic heart disease of native coronary artery without angina pectoris: Secondary | ICD-10-CM | POA: Diagnosis not present

## 2023-06-04 DIAGNOSIS — Z23 Encounter for immunization: Secondary | ICD-10-CM | POA: Diagnosis not present

## 2023-06-04 DIAGNOSIS — E782 Mixed hyperlipidemia: Secondary | ICD-10-CM | POA: Diagnosis not present

## 2023-06-10 ENCOUNTER — Inpatient Hospital Stay: Payer: Medicare Other | Admitting: Oncology

## 2023-07-28 ENCOUNTER — Ambulatory Visit: Payer: Medicare Other | Admitting: Hematology and Oncology

## 2023-07-30 ENCOUNTER — Encounter: Payer: Self-pay | Admitting: Oncology

## 2023-07-30 ENCOUNTER — Other Ambulatory Visit: Payer: Self-pay | Admitting: Oncology

## 2023-07-30 ENCOUNTER — Inpatient Hospital Stay: Payer: Medicare Other | Attending: Oncology | Admitting: Oncology

## 2023-07-30 ENCOUNTER — Inpatient Hospital Stay: Payer: Medicare Other | Admitting: Hematology and Oncology

## 2023-07-30 VITALS — BP 155/79 | HR 70 | Temp 97.6°F | Resp 18 | Ht 61.0 in | Wt 113.9 lb

## 2023-07-30 DIAGNOSIS — Z85820 Personal history of malignant melanoma of skin: Secondary | ICD-10-CM | POA: Diagnosis not present

## 2023-07-30 DIAGNOSIS — C50412 Malignant neoplasm of upper-outer quadrant of left female breast: Secondary | ICD-10-CM | POA: Diagnosis not present

## 2023-07-30 DIAGNOSIS — Z1231 Encounter for screening mammogram for malignant neoplasm of breast: Secondary | ICD-10-CM

## 2023-07-30 DIAGNOSIS — M8580 Other specified disorders of bone density and structure, unspecified site: Secondary | ICD-10-CM | POA: Insufficient documentation

## 2023-07-30 DIAGNOSIS — L639 Alopecia areata, unspecified: Secondary | ICD-10-CM | POA: Insufficient documentation

## 2023-07-30 DIAGNOSIS — C4372 Malignant melanoma of left lower limb, including hip: Secondary | ICD-10-CM

## 2023-07-30 DIAGNOSIS — Z853 Personal history of malignant neoplasm of breast: Secondary | ICD-10-CM | POA: Diagnosis not present

## 2023-07-30 NOTE — Progress Notes (Signed)
 [facility]  [address] [place],  [place]  [address] [phone]  Clinic Day: 07/30/23  Referring physician: Fernand Tracey LABOR, MD   CHIEF COMPLAINT:  CC: History of stage 0 left breast cancer  Current Treatment:  Surveillance  HISTORY OF PRESENT ILLNESS:  Faith Gaines is a 78 y.o. female with a history of stage 0 left breast cancer diagnosed in November of 2014 and treated with a lumpectomy in February of 2015. 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  This measured 0.1 cm for a Tis N0 N0.  She was offered chemoprevention but declined because of many other comorbidities.  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  She does have osteopenia and her last bone density scan was done in January of 2015. She gets these done through her gynecologist in [place].  In April of 2016, she had a total abdominal hysterectomy and bilateral salpingo-oophorectomy.  She did have a iron deficiency anemia in the past, but this corrected.  She tells me that she met with her primary care physician in December of 2016, and she was anemic again with a hemoglobin down to 11.  She was found to have iron deficiency.  She did complain of occasional soreness of her tongue, dizziness, and craving for ice.  She was placed on slow release iron after she was not able to tolerate ferrous sulfate.  She was fatigued and weak with dyspnea on exertion, and had daily headaches as well.  She had severe bleeding from the rectum which she attributes to hemorrhoids. Studies still showed evidence of iron deficiency and so she was given 2 injections of Feraheme at the end of December  2016, 1 week apart.  She has had endoscopy and colonoscopy through Dr. Kristie in [place] several years ago, so she was referred back, and had these repeated in January of 2017.  EGD revealed diffuse moderate gastritis  and colonoscopy revealed hemorrhoids.  She was told to avoid all nonsteroidal anti-inflammatory medications.    She has been diagnosed with lichen planopilaris and is using a liquid steroid from the dermatologist in [place], and topical Rogaine.  They referred her to [facility], and they felt this was immune mediated and placed her on hydroxychloroquine 200 mg twice daily.  She feels this is helping some.  She had a biopsy in the past of her right upper arm by a dermatologist in [place], Dr. Tricia, and was told this was "pre melanoma".  She had a bone density scan 2 years ago, which apparently was not as good as previous.    She underwent triple bypass surgery in June 2021 and her hemoglobin was 9.0 postop.  They took veins from her left leg and left chest.  Since her surgery, she has had pain within that left breast and along the sternal incision.  When she was seen in September 2021, she was still having a lot of post-thoracotomy pain and so I placed her on a low dose of Elavil  25 mg.  She reported drowsiness.  Dr. Shyrl placed her on Lyrica  25 mg BID.  CT chest from January 2022 revealed mild emphysematous changes but no acute pulmonary findings or worrisome pulmonary lesions.  No mediastinal or hilar mass or adenopathy was observed.  I saw her in December 2022 and she was on gabapentin  100 mg twice daily without benefit.  I therefore recommended a slow increase in the dose and she is now up to 300 mg twice daily with 3 pills at bedtime.  INTERVAL HISTORY:  Faith Gaines is here for follow up of her postthoracotomy pain syndrome and history of breast cancer. Patient states that she feels well and has no complaints of pain. She informed me that she had 3 herniated disc in her cervical spine and had to have surgery done. After surgery she had gotten severely sick and was admitted into the ED, it was found that she had the flu and was hospitalized. She still has occasional sternal pain but  it is less now.  She states that although this has happened she feels the best she has felt in months. She had a screening bilateral mammogram done on 05/24/2023 that was clear. I will see her back in 1 year with screening bilateral mammogram.   She denies signs of infection such as sore throat, sinus drainage, cough, or urinary symptoms.  She denies fevers or recurrent chills. She denies pain. She denies nausea, vomiting, chest pain, dyspnea or cough. Her appetite is good and her weight has increased 2 pounds over last 8 months .  REVIEW OF SYSTEMS:  Review of Systems  Constitutional: Negative.  Negative for appetite change, chills, diaphoresis, fatigue, fever and unexpected weight change.  HENT:   Negative for hearing loss, lump/mass, mouth sores, nosebleeds, sore throat, tinnitus, trouble swallowing and voice change.   Eyes: Negative.  Negative for eye problems and icterus.  Respiratory: Negative.  Negative for chest tightness, cough, hemoptysis, shortness of breath and wheezing.   Cardiovascular: Negative.  Negative for chest pain, leg swelling and palpitations.  Gastrointestinal: Negative.  Negative for abdominal distention, abdominal pain, blood in stool, constipation, diarrhea, nausea and vomiting.  Endocrine: Negative.   Genitourinary: Negative.  Negative for bladder incontinence, difficulty urinating, dyspareunia, dysuria, frequency, hematuria, menstrual problem, nocturia, pelvic pain, vaginal bleeding and vaginal discharge.   Musculoskeletal:  Positive for arthralgias (left shoulder), myalgias and neck pain. Negative for back pain, flank pain, gait problem and neck stiffness.  Skin: Negative.   Neurological:  Negative for dizziness, extremity weakness, gait problem, headaches, light-headedness, numbness, seizures and speech difficulty.  Hematological: Negative.  Negative for adenopathy. Does not bruise/bleed easily.  Psychiatric/Behavioral: Negative.  Negative for confusion, decreased  concentration, depression, sleep disturbance and suicidal ideas. The patient is not nervous/anxious.      VITALS:  Blood pressure (!) 155/79, pulse 70, temperature 97.6 F (36.4 C), temperature source Oral, resp. rate 18, height 5' 1 (1.549 m), weight 113 lb 14.4 oz (51.7 kg), SpO2 100%.  Wt Readings from Last 3 Encounters:  07/30/23 113 lb 14.4 oz (51.7 kg)  11/09/22 111 lb 12.8 oz (50.7 kg)  09/04/22 106 lb (48.1 kg)    Body mass index is 21.52 kg/m.  Performance status (ECOG): 1 - Symptomatic but completely ambulatory  PHYSICAL EXAM:  Physical Exam Vitals and nursing note reviewed.  Constitutional:      General: She is not in acute distress.    Appearance: Normal appearance. She is normal weight. She is not ill-appearing, toxic-appearing or diaphoretic.  HENT:     Head: Normocephalic and atraumatic.     Right Ear: Tympanic membrane, ear canal and external ear normal. There is no impacted cerumen.     Left Ear: Tympanic membrane, ear canal and external ear normal. There is no impacted cerumen.     Nose: Nose normal. No congestion or rhinorrhea.  Mouth/Throat:     Mouth: Mucous membranes are moist.     Pharynx: Oropharynx is clear. No oropharyngeal exudate or posterior oropharyngeal erythema.  Eyes:     General: No scleral icterus.       Right eye: No discharge.        Left eye: No discharge.     Extraocular Movements: Extraocular movements intact.     Conjunctiva/sclera: Conjunctivae normal.     Pupils: Pupils are equal, round, and reactive to light.  Neck:     Vascular: No carotid bruit.     Comments: Small scar in the right face/maxilla. Barely visible scar in the right anterior neck and a right carotid scar. Cardiovascular:     Rate and Rhythm: Normal rate and regular rhythm.     Pulses: Normal pulses.     Heart sounds: Normal heart sounds. No murmur heard.    No friction rub. No gallop.  Pulmonary:     Effort: Pulmonary effort is normal. No respiratory  distress.     Breath sounds: Normal breath sounds. No stridor. No wheezing, rhonchi or rales.  Chest:     Chest wall: No tenderness.  Breasts:    Right: Normal.     Left: Normal.     Comments: Well-healed anterior Sternotomy scar with the xiphoid process to the right of the center Seborrheic keratosis in the upper outer quadrant of the right breast and well healed scar in the upper outer quadrant of the right breast No masses in either breasts Abdominal:     General: Bowel sounds are normal. There is no distension.     Palpations: Abdomen is soft. There is no hepatomegaly, splenomegaly or mass.     Tenderness: There is no abdominal tenderness. There is no right CVA tenderness, left CVA tenderness, guarding or rebound.     Hernia: No hernia is present.  Musculoskeletal:        General: No swelling, tenderness, deformity or signs of injury. Normal range of motion.     Cervical back: Normal range of motion and neck supple. No rigidity or tenderness.     Right lower leg: No edema.     Left lower leg: No edema.  Lymphadenopathy:     Cervical: No cervical adenopathy.  Skin:    General: Skin is warm and dry.     Coloration: Skin is not jaundiced or pale.     Findings: No bruising, erythema, lesion or rash.  Neurological:     General: No focal deficit present.     Mental Status: She is alert and oriented to person, place, and time. Mental status is at baseline.     Cranial Nerves: No cranial nerve deficit.     Sensory: No sensory deficit.     Motor: No weakness.     Coordination: Coordination normal.     Gait: Gait normal.     Deep Tendon Reflexes: Reflexes normal.  Psychiatric:        Mood and Affect: Mood normal.        Behavior: Behavior normal.        Thought Content: Thought content normal.        Judgment: Judgment normal.     LABS:      Latest Ref Rng & Units 01/04/2023   12:44 PM 09/05/2022    3:52 AM 09/04/2022   11:34 AM  CBC  WBC 4.0 - 10.5 K/uL 9.1  6.1     Hemoglobin 12.0 - 15.0 g/dL 13.3  12.2  15.3   Hematocrit 36.0 - 46.0 % 40.7  36.5  45.0   Platelets 150 - 400 K/uL 202  271        Latest Ref Rng & Units 01/04/2023   12:44 PM 09/07/2022    4:31 AM 09/06/2022    4:33 AM  CMP  Glucose 70 - 99 mg/dL 876  95  845   BUN 8 - 23 mg/dL 15  <5  5   Creatinine 0.44 - 1.00 mg/dL 9.12  9.39  9.36   Sodium 135 - 145 mmol/L 143  141  140   Potassium 3.5 - 5.1 mmol/L 4.1  3.6  3.0   Chloride 98 - 111 mmol/L 104  106  110   CO2 22 - 32 mmol/L 23  26  24    Calcium  8.9 - 10.3 mg/dL 89.6  9.7  9.6     Lab Results  Component Value Date   TIBC 372 05/29/2020   FERRITIN 99 05/29/2020   IRONPCTSAT 20 05/29/2020   Lab Results  Component Value Date   HGBA1C 7.8 (H) 08/20/2022    STUDIES:  EXAM: 05/24/2023 DIGITAL SCREENING BILATERAL MAMMOGRAM WITH TOMOSYNTHESIS AND CAD IMPRESSION: No mammographic evidence of malignancy. A result letter of this screening mammogram will be mailed directly to the patient.   Allergies:  Allergies  Allergen Reactions   Codeine Nausea And Vomiting and Other (See Comments)    Headaches   Ace Inhibitors Other (See Comments)    Drop in GFR    Atorvastatin     Body aches Other reaction(s): body aches Other reaction(s): body aches   Biaxin [Clarithromycin] Other (See Comments)    Insomnia    Crestor [Rosuvastatin] Other (See Comments)    Myalgias    Iodine Other (See Comments)    Fungus    Plaquenil [Hydroxychloroquine Sulfate] Itching   Avandamet [Rosiglitazone-Metformin ] Rash and Other (See Comments)    Hyperactivity    Betadine [Povidone Iodine] Rash    Current Medications: Current Outpatient Medications  Medication Sig Dispense Refill   acetaminophen  (TYLENOL ) 325 MG tablet Take 650 mg by mouth every 6 (six) hours as needed for mild pain, moderate pain, fever or headache.     aspirin  EC 81 MG tablet Take 81 mg by mouth daily.     Biotin 5000 MCG TABS Take 1,000 mcg by mouth daily.      Cholecalciferol (VITAMIN D3) 50 MCG (2000 UT) TABS Take 2,000 Units by mouth daily.     Evolocumab  (REPATHA  SURECLICK) 140 MG/ML SOAJ Inject 140 mg into the skin every 14 (fourteen) days. 6 mL 3   famotidine  (PEPCID ) 40 MG tablet Take 40 mg by mouth at bedtime.     gabapentin  (NEURONTIN ) 300 MG capsule Take 1 capsule (300 mg total) by mouth 3 (three) times daily. (Patient taking differently: Take 300 mg by mouth 4 (four) times daily.) 270 capsule 2   HYDROcodone -acetaminophen  (NORCO/VICODIN) 5-325 MG tablet Take 1 tablet by mouth every 6 (six) hours as needed for moderate pain or severe pain. 20 tablet 0   metFORMIN  (GLUCOPHAGE -XR) 500 MG 24 hr tablet Take 500 mg by mouth at bedtime.     methocarbamol  (ROBAXIN ) 750 MG tablet Take 1 tablet (750 mg total) by mouth every 6 (six) hours as needed for muscle spasms. 60 tablet 0   Multiple Vitamins-Minerals (PRESERVISION AREDS 2 PO) Take 1 capsule by mouth daily. (Patient not taking: Reported on 11/09/2022)     nebivolol  (BYSTOLIC ) 5 MG tablet  Take 5 mg by mouth daily.     Nebivolol  HCl (BYSTOLIC ) 20 MG TABS Take 20 mg by mouth daily.     Omega-3 Fatty Acids (FISH OIL) 1000 MG CAPS Take 1,000 mg by mouth daily.     ondansetron  (ZOFRAN ) 4 MG tablet Take 1 tablet (4 mg total) by mouth every 8 (eight) hours as needed for nausea or vomiting. (Patient not taking: Reported on 11/09/2022) 20 tablet 0   pantoprazole  (PROTONIX ) 40 MG tablet Take 40 mg by mouth daily.     promethazine  (PHENERGAN ) 25 MG tablet Take 25 mg by mouth every 6 (six) hours as needed for nausea or vomiting. (Patient not taking: Reported on 11/09/2022)     Semaglutide,0.25 or 0.5MG /DOS, (OZEMPIC, 0.25 OR 0.5 MG/DOSE,) 2 MG/1.5ML SOPN Inject 0.5 mg into the skin once a week.     vitamin B-12 (CYANOCOBALAMIN) 500 MCG tablet Take 500 mcg by mouth daily.     No current facility-administered medications for this visit.     ASSESSMENT & PLAN:  Assessment:  1. Stage 0 breast cancer November 2014,  treated with lumpectomy.  She has no evidence of disease.  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  3. Osteopenia. She is taking oral vitamin D .   4. History of malignant melanoma resected from her left leg at age 27. No evidence of disease.  5. Triple bypass surgery in June 2021.  She completed cardiac rehab.  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  She is now on gabapentin  at 300 mg twice daily and 3 pills at bedtime with fair relief.  6.. Severe pain of the left upper extremity. She has had surgery on her cervical spine for 3 herniated discs.   Plan: She informed me that she had 3 herniated discs in her cervical spine and had to have surgery done. After surgery she had gotten severely sick and was admitted into the ED, it was found that she had the flu and was hospitalized. She still has occasional sternal pain but it is less now.  She states that although this has happened she feels the best she has felt in months. She had a screening bilateral mammogram done on 05/24/2023 that was clear. I will see her back in 1 year with screening  bilateral mammogram. She understands and agrees with this plan of care.  I provided 22 minutes of face-to-face time during this this encounter and > 50% was spent counseling as documented under my assessment and plan.   Wanda VEAR Cornish, MD [facility] [address] [place] [address] Dept: [phone] Dept Fax: [phone]   No orders of the defined types were placed in this encounter.  I,Jasmine M Lassiter,acting as a scribe for Wanda VEAR Cornish, MD.,have documented all relevant documentation on the behalf of Wanda VEAR Cornish, MD,as directed by  Wanda VEAR Cornish, MD while in the presence of Wanda VEAR Cornish, MD.

## 2023-11-11 ENCOUNTER — Ambulatory Visit: Payer: Medicare Other | Admitting: Cardiology

## 2023-11-18 DIAGNOSIS — E118 Type 2 diabetes mellitus with unspecified complications: Secondary | ICD-10-CM | POA: Insufficient documentation

## 2023-11-18 NOTE — Progress Notes (Signed)
  Cardiology Office Note:   Date:  11/19/2023  ID:  Faith Gaines, DOB [birth year], MRN [id number] PCP: Beecher Bower, MD  [facility] Providers Cardiologist:  Eilleen Grates, MD {  History of Present Illness:   Faith Gaines is a 78 y.o. female  with a history of CAD/CABG.  She was hospitalized in Feb 2024 with diabetic ketoacidosis.  She had AKI.    Since I last saw her she has done OK.  The patient denies any new symptoms such as chest discomfort, neck or arm discomfort. There has been no new shortness of breath, PND or orthopnea. There have been no reported palpitations, presyncope or syncope.    She does have some soreness at times in her back and under her left axilla and left side sometimes sternal but it does not sound anginal.  It sounds positional.  She walks for exercise and it does not bother her.  She does her household chores without problems.  She denies any nausea vomiting diaphoresis.  She has no new shortness of breath, PND or orthopnea.  She still have problems following her neck surgery.  ROS: As stated in the HPI and negative for all other systems.  Studies Reviewed:    EKG:   EKG Interpretation Date/Time:  Friday Nov 19 2023 08:39:27 EDT Ventricular Rate:  61 PR Interval:  170 QRS Duration:  86 QT Interval:  418 QTC Calculation: 420 R Axis:   -58  Text Interpretation: Normal sinus rhythm Left axis deviation  Possible septal infarct age undetermined. Non-specific ST-t changes No significant change since last tracing  12/2022 Confirmed by Eilleen Grates ([id number]) on 11/19/2023 8:43:17 AM    Risk Assessment/Calculations:              Physical Exam:   VS:  BP 138/86   Pulse 61   Ht 5\' 1"  (1.549 m)   Wt 110 lb 3.2 oz (50 kg)   SpO2 97%   BMI 20.82 kg/m    Wt Readings from Last 3 Encounters:  11/19/23 110 lb 3.2 oz (50 kg)  07/30/23 113 lb 14.4 oz (51.7 kg)  11/09/22 111 lb 12.8 oz (50.7 kg)     GEN: Well nourished, well  developed in no acute distress NECK: No JVD; No carotid bruits CARDIAC: RRR, no murmurs, rubs, gallops RESPIRATORY:  Clear to auscultation without rales, wheezing or rhonchi  ABDOMEN: Soft, non-tender, non-distended EXTREMITIES:  No edema; No deformity   ASSESSMENT AND PLAN:   CAD/CABG:  The patient has no new sypmtoms.  No further cardiovascular testing is indicated.  We will continue with aggressive risk reduction and meds as listed.   DM: A1c was 7.3 which is lower than previous.  She is managed by her primary provider.     DYSLIPIDEMIA:   LDL -2 previously but I do not see a recent 1.  She will continue the Repatha .  I we will check lipid profile today.   CAROTID: She is followed by [facility] with carotid Dopplers every couple of years.     Follow up with me in 1 year  Signed, Eilleen Grates, MD

## 2023-11-19 ENCOUNTER — Encounter: Payer: Self-pay | Admitting: Cardiology

## 2023-11-19 ENCOUNTER — Ambulatory Visit: Attending: Cardiology | Admitting: Cardiology

## 2023-11-19 VITALS — BP 138/86 | HR 61 | Ht 61.0 in | Wt 110.2 lb

## 2023-11-19 DIAGNOSIS — I2510 Atherosclerotic heart disease of native coronary artery without angina pectoris: Secondary | ICD-10-CM | POA: Diagnosis not present

## 2023-11-19 DIAGNOSIS — E118 Type 2 diabetes mellitus with unspecified complications: Secondary | ICD-10-CM | POA: Diagnosis not present

## 2023-11-19 DIAGNOSIS — E785 Hyperlipidemia, unspecified: Secondary | ICD-10-CM | POA: Insufficient documentation

## 2023-11-19 DIAGNOSIS — E782 Mixed hyperlipidemia: Secondary | ICD-10-CM | POA: Diagnosis not present

## 2023-11-19 MED ORDER — REPATHA SURECLICK 140 MG/ML ~~LOC~~ SOAJ: 140.0000 mg | SUBCUTANEOUS | 3 refills | Status: AC

## 2023-11-19 NOTE — Patient Instructions (Addendum)
 Medication Instructions:  Refilled repatha  Your physician recommends that you continue on your current medications as directed. Please refer to the Current Medication list given to you today.  *If you need a refill on your cardiac medications before your next appointment, please call your pharmacy*  Lab Work: Fasting lipid panel today If you have labs (blood work) drawn today and your tests are completely normal, you will receive your results only by: MyChart Message (if you have MyChart) OR A paper copy in the mail If you have any lab test that is abnormal or we need to change your treatment, we will call you to review the results.  Testing/Procedures: NONE  Follow-Up: At [facility], you and your health needs are our priority.  As part of our continuing mission to provide you with exceptional heart care, our providers are all part of one team.  This team includes your primary Cardiologist (physician) and Advanced Practice Providers or APPs (Physician Assistants and Nurse Practitioners) who all work together to provide you with the care you need, when you need it.  Your next appointment:   1 year(s)  Provider:   Eilleen Grates, MD    We recommend signing up for the patient portal called "MyChart".  Sign up information is provided on this After Visit Summary.  MyChart is used to connect with patients for Virtual Visits (Telemedicine).  Patients are able to view lab/test results, encounter notes, upcoming appointments, etc.  Non-urgent messages can be sent to your provider as well.   To learn more about what you can do with MyChart, go to [email or web address].   Other Instructions

## 2023-11-20 LAB — LIPID PANEL
Chol/HDL Ratio: 2.4 ratio (ref 0.0–4.4)
Cholesterol, Total: 110 mg/dL (ref 100–199)
HDL: 46 mg/dL (ref >39)
LDL Chol Calc (NIH): 24 mg/dL (ref 0–99)
Triglycerides: 275 mg/dL — ABNORMAL HIGH (ref 0–149)
VLDL Cholesterol Cal: 40 mg/dL (ref 5–40)

## 2023-11-22 ENCOUNTER — Encounter: Payer: Self-pay | Admitting: *Deleted

## 2023-11-24 DIAGNOSIS — L814 Other melanin hyperpigmentation: Secondary | ICD-10-CM | POA: Diagnosis not present

## 2023-11-24 DIAGNOSIS — Z85828 Personal history of other malignant neoplasm of skin: Secondary | ICD-10-CM | POA: Diagnosis not present

## 2023-11-24 DIAGNOSIS — Z85820 Personal history of malignant melanoma of skin: Secondary | ICD-10-CM | POA: Diagnosis not present

## 2023-11-24 DIAGNOSIS — L219 Seborrheic dermatitis, unspecified: Secondary | ICD-10-CM | POA: Diagnosis not present

## 2023-11-24 DIAGNOSIS — L578 Other skin changes due to chronic exposure to nonionizing radiation: Secondary | ICD-10-CM | POA: Diagnosis not present

## 2023-11-24 DIAGNOSIS — L570 Actinic keratosis: Secondary | ICD-10-CM | POA: Diagnosis not present

## 2023-11-24 DIAGNOSIS — L820 Inflamed seborrheic keratosis: Secondary | ICD-10-CM | POA: Diagnosis not present

## 2023-11-24 DIAGNOSIS — D225 Melanocytic nevi of trunk: Secondary | ICD-10-CM | POA: Diagnosis not present

## 2023-11-24 DIAGNOSIS — L821 Other seborrheic keratosis: Secondary | ICD-10-CM | POA: Diagnosis not present

## 2023-11-24 DIAGNOSIS — Z86018 Personal history of other benign neoplasm: Secondary | ICD-10-CM | POA: Diagnosis not present

## 2023-11-29 DIAGNOSIS — N2889 Other specified disorders of kidney and ureter: Secondary | ICD-10-CM | POA: Diagnosis not present

## 2023-11-29 DIAGNOSIS — I2510 Atherosclerotic heart disease of native coronary artery without angina pectoris: Secondary | ICD-10-CM | POA: Diagnosis not present

## 2023-11-29 DIAGNOSIS — E1169 Type 2 diabetes mellitus with other specified complication: Secondary | ICD-10-CM | POA: Diagnosis not present

## 2023-11-29 DIAGNOSIS — E782 Mixed hyperlipidemia: Secondary | ICD-10-CM | POA: Diagnosis not present

## 2023-12-01 ENCOUNTER — Other Ambulatory Visit (HOSPITAL_BASED_OUTPATIENT_CLINIC_OR_DEPARTMENT_OTHER): Payer: Self-pay | Admitting: Family Medicine

## 2023-12-01 ENCOUNTER — Encounter (HOSPITAL_BASED_OUTPATIENT_CLINIC_OR_DEPARTMENT_OTHER): Payer: Self-pay | Admitting: Family Medicine

## 2023-12-01 DIAGNOSIS — N2889 Other specified disorders of kidney and ureter: Secondary | ICD-10-CM

## 2023-12-23 ENCOUNTER — Ambulatory Visit (HOSPITAL_BASED_OUTPATIENT_CLINIC_OR_DEPARTMENT_OTHER)
Admission: RE | Admit: 2023-12-23 | Discharge: 2023-12-23 | Disposition: A | Discharge status: Home / Self Care | Source: Ambulatory Visit | Attending: Family Medicine | Admitting: Family Medicine

## 2023-12-23 DIAGNOSIS — N281 Cyst of kidney, acquired: Secondary | ICD-10-CM | POA: Diagnosis not present

## 2023-12-23 DIAGNOSIS — Z853 Personal history of malignant neoplasm of breast: Secondary | ICD-10-CM | POA: Diagnosis not present

## 2023-12-23 DIAGNOSIS — N2889 Other specified disorders of kidney and ureter: Secondary | ICD-10-CM | POA: Diagnosis not present

## 2023-12-23 DIAGNOSIS — N261 Atrophy of kidney (terminal): Secondary | ICD-10-CM | POA: Diagnosis not present

## 2023-12-23 MED ORDER — GADOBUTROL 1 MMOL/ML IV SOLN
5.0000 mL | Freq: Once | INTRAVENOUS | Status: AC | PRN
  Administered 2023-12-23: 5 mL via INTRAVENOUS

## 2024-01-10 DIAGNOSIS — H25813 Combined forms of age-related cataract, bilateral: Secondary | ICD-10-CM | POA: Diagnosis not present

## 2024-01-10 DIAGNOSIS — I10 Essential (primary) hypertension: Secondary | ICD-10-CM | POA: Diagnosis not present

## 2024-01-10 DIAGNOSIS — E119 Type 2 diabetes mellitus without complications: Secondary | ICD-10-CM | POA: Diagnosis not present

## 2024-01-10 DIAGNOSIS — H18413 Arcus senilis, bilateral: Secondary | ICD-10-CM | POA: Diagnosis not present

## 2024-02-21 ENCOUNTER — Other Ambulatory Visit: Payer: Self-pay | Admitting: *Deleted

## 2024-02-21 DIAGNOSIS — I6523 Occlusion and stenosis of bilateral carotid arteries: Secondary | ICD-10-CM

## 2024-03-21 ENCOUNTER — Ambulatory Visit: Admitting: Physician Assistant

## 2024-03-21 ENCOUNTER — Ambulatory Visit (HOSPITAL_COMMUNITY)
Admission: RE | Admit: 2024-03-21 | Discharge: 2024-03-21 | Disposition: A | Discharge status: Home / Self Care | Source: Ambulatory Visit | Attending: Surgery | Admitting: Surgery

## 2024-03-21 VITALS — BP 175/84 | HR 65 | Temp 98.0°F | Wt 113.3 lb

## 2024-03-21 DIAGNOSIS — I6523 Occlusion and stenosis of bilateral carotid arteries: Secondary | ICD-10-CM | POA: Diagnosis not present

## 2024-03-21 NOTE — Progress Notes (Signed)
 Office Note   History of Present Illness   Faith Gaines is a 78 y.o. ([birth year]) female who presents for surveillance of carotid artery stenosis.  She has a history of right carotid endarterectomy in 2014 by Dr. Gerlean for asymptomatic high-grade stenosis.  She returns today for follow-up.  She is doing well at today's visit without any complaints.  She is recovering from recent cervical spine surgery.  She denies any recent strokelike symptoms such as slurred speech, facial droop, sudden visual changes, or sudden weakness/numbness.  She takes a daily aspirin .  She is on Repatha  for cholesterol management.  Current Outpatient Medications  Medication Sig Dispense Refill   acetaminophen  (TYLENOL ) 325 MG tablet Take 650 mg by mouth every 6 (six) hours as needed for mild pain, moderate pain, fever or headache.     aspirin  EC 81 MG tablet Take 81 mg by mouth daily.     Biotin 5000 MCG TABS Take 1,000 mcg by mouth daily.     Cholecalciferol (VITAMIN D3) 50 MCG (2000 UT) TABS Take 2,000 Units by mouth daily.     Dapagliflozin Propanediol (FARXIGA PO) Take 10 mg by mouth daily.     Evolocumab  (REPATHA  SURECLICK) 140 MG/ML SOAJ Inject 140 mg into the skin every 14 (fourteen) days. 6 mL 3   famotidine  (PEPCID ) 40 MG tablet Take 40 mg by mouth at bedtime.     gabapentin  (NEURONTIN ) 300 MG capsule Take 1 capsule (300 mg total) by mouth 3 (three) times daily. (Patient taking differently: Take 300 mg by mouth 4 (four) times daily.) 270 capsule 2   metFORMIN  (GLUCOPHAGE -XR) 500 MG 24 hr tablet Take 500 mg by mouth at bedtime.     methocarbamol  (ROBAXIN ) 750 MG tablet Take 1 tablet (750 mg total) by mouth every 6 (six) hours as needed for muscle spasms. 60 tablet 0   Multiple Vitamins-Minerals (PRESERVISION AREDS 2 PO) Take 1 capsule by mouth daily.     nebivolol  (BYSTOLIC ) 5 MG tablet Take 5 mg by mouth daily.     Nebivolol  HCl (BYSTOLIC ) 20 MG TABS Take 20 mg by mouth daily.      Omega-3 Fatty Acids (FISH OIL) 1000 MG CAPS Take 1,000 mg by mouth daily.     ondansetron  (ZOFRAN ) 4 MG tablet Take 1 tablet (4 mg total) by mouth every 8 (eight) hours as needed for nausea or vomiting. 20 tablet 0   pantoprazole  (PROTONIX ) 40 MG tablet Take 40 mg by mouth daily.     promethazine  (PHENERGAN ) 25 MG tablet Take 25 mg by mouth every 6 (six) hours as needed for nausea or vomiting.     Semaglutide,0.25 or 0.5MG /DOS, (OZEMPIC, 0.25 OR 0.5 MG/DOSE,) 2 MG/1.5ML SOPN Inject 1 mg into the skin once a week.     vitamin B-12 (CYANOCOBALAMIN) 500 MCG tablet Take 500 mcg by mouth daily.     No current facility-administered medications for this visit.    REVIEW OF SYSTEMS (negative unless checked):   Cardiac:  []  Chest pain or chest pressure? []  Shortness of breath upon activity? []  Shortness of breath when lying flat? []  Irregular heart rhythm?  Vascular:  []  Pain in calf, thigh, or hip brought on by walking? []  Pain in feet at night that wakes you up from your sleep? []  Blood clot in your veins? []  Leg swelling?  Pulmonary:  []  Oxygen at home? []  Productive cough? []  Wheezing?  Neurologic:  []  Sudden weakness in arms or legs? []  Sudden numbness  in arms or legs? []  Sudden onset of difficult speaking or slurred speech? []  Temporary loss of vision in one eye? []  Problems with dizziness?  Gastrointestinal:  []  Blood in stool? []  Vomited blood?  Genitourinary:  []  Burning when urinating? []  Blood in urine?  Psychiatric:  []  Major depression  Hematologic:  []  Bleeding problems? []  Problems with blood clotting?  Dermatologic:  []  Rashes or ulcers?  Constitutional:  []  Fever or chills?  Ear/Nose/Throat:  []  Change in hearing? []  Nose bleeds? []  Sore throat?  Musculoskeletal:  []  Back pain? []  Joint pain? []  Muscle pain?   Physical Examination   Vitals:   03/21/24 0822 03/21/24 0824  BP: (!) 170/85 (!) 175/84  Pulse: 65   Temp: 98 F (36.7 C)    TempSrc: Temporal   Weight: 113 lb 4.8 oz (51.4 kg)    Body mass index is 21.41 kg/m.  General:  WDWN in NAD; vital signs documented above Gait: Not observed HENT: WNL, normocephalic Pulmonary: normal non-labored breathing , without rales, rhonchi,  wheezing Cardiac: regular Abdomen: soft, NT, no masses Skin: without rashes Vascular Exam/Pulses: palpable radial pulses bilaterally Extremities: without ischemic changes, without gangrene , without cellulitis; without open wounds;  Musculoskeletal: no muscle wasting or atrophy  Neurologic: A&O X 3;  No focal weakness or paresthesias are detected Psychiatric:  The pt has Normal affect.  Non-Invasive Vascular Imaging   Bilateral Carotid Duplex (03/21/2024):  R ICA stenosis:  1-39% R VA:  patent and antegrade L ICA stenosis:  1-39% L VA:  patent and antegrade   Medical Decision Making   [name] is a 78 y.o. female who presents for surveillance of carotid artery stenosis  Based on the patient's vascular studies, her carotid artery stenosis is unchanged bilaterally at 1 to 39% She denies any strokelike symptoms such as slurred speech, facial droop, sudden visual changes, or sudden weakness/numbness. She has no neuro deficits on exam. She has palpable and equal radial pulses bilaterally She can continue her aspirin  and Repatha  and follow-up with our office in 2 years with repeat carotid duplex   Ahmed Holster PA-C [facility] Office: [phone]  Clinic MD: Magda

## 2024-03-28 DIAGNOSIS — Z1331 Encounter for screening for depression: Secondary | ICD-10-CM | POA: Diagnosis not present

## 2024-03-28 DIAGNOSIS — Z9181 History of falling: Secondary | ICD-10-CM | POA: Diagnosis not present

## 2024-03-28 DIAGNOSIS — Z0000 Encounter for general adult medical examination without abnormal findings: Secondary | ICD-10-CM | POA: Diagnosis not present

## 2024-03-28 DIAGNOSIS — H93A2 Pulsatile tinnitus, left ear: Secondary | ICD-10-CM | POA: Diagnosis not present

## 2024-04-19 DIAGNOSIS — H2513 Age-related nuclear cataract, bilateral: Secondary | ICD-10-CM | POA: Diagnosis not present

## 2024-04-19 DIAGNOSIS — E119 Type 2 diabetes mellitus without complications: Secondary | ICD-10-CM | POA: Diagnosis not present

## 2024-04-19 DIAGNOSIS — H353131 Nonexudative age-related macular degeneration, bilateral, early dry stage: Secondary | ICD-10-CM | POA: Diagnosis not present

## 2024-04-19 DIAGNOSIS — H2511 Age-related nuclear cataract, right eye: Secondary | ICD-10-CM | POA: Diagnosis not present

## 2024-04-19 DIAGNOSIS — Z794 Long term (current) use of insulin: Secondary | ICD-10-CM | POA: Diagnosis not present

## 2024-04-19 DIAGNOSIS — H25043 Posterior subcapsular polar age-related cataract, bilateral: Secondary | ICD-10-CM | POA: Diagnosis not present

## 2024-04-19 DIAGNOSIS — H25041 Posterior subcapsular polar age-related cataract, right eye: Secondary | ICD-10-CM | POA: Diagnosis not present

## 2024-04-21 ENCOUNTER — Telehealth (HOSPITAL_BASED_OUTPATIENT_CLINIC_OR_DEPARTMENT_OTHER): Payer: Self-pay | Admitting: *Deleted

## 2024-04-21 NOTE — Telephone Encounter (Signed)
   Patient Name: Faith Gaines  DOB: [birth year] MRN: [id number]  Primary Cardiologist: Lynwood Schilling, MD  Chart reviewed as part of pre-operative protocol coverage. Cataract extractions are recognized in guidelines as low risk surgeries that do not typically require specific preoperative testing or holding of blood thinner therapy. Therefore, given past medical history and time since last visit, based on ACC/AHA guidelines, Faith Gaines would be at acceptable risk for the planned procedures on 05/16/2024 and 05/23/2024 without further cardiovascular testing.   I will route this recommendation to the requesting party via Epic fax function and remove from pre-op pool.  Please call with questions.  Lamarr Satterfield, NP 04/21/2024, 10:20 AM

## 2024-04-21 NOTE — Telephone Encounter (Signed)
   Pre-operative Risk Assessment    Patient Name: Faith Gaines  DOB: [birth year] MRN: [id number]   Date of last office visit: 11/19/23 DR. HOCHRIEN Date of next office visit: NONE   Request for Surgical Clearance    Procedure:  CATARACT EXTRACTION W/INTRAOCULAR LENS IMPLANT OF THE RIGHT EYE TO BE DONE ON 05/16/24; THE LEFT EYE WILL BE FOLLOWED ON 05/23/24  Date of Surgery:  Clearance 05/16/24 RIGHT EYE; THE LEFT EYE TO BE DONE ON 05/23/24                               Surgeon:  NOT LISTED Surgeon's Group or Practice Name:  [facility] Phone number:  [phone] Fax number:  [phone]   Type of Clearance Requested:   - Medical ; PERO FORM NO MEDICATIONS NEED TO BE HELD   Type of Anesthesia:  TOPICAL WITH IV MEDICATION   Additional requests/questions:    Bonney Niels Jest   04/21/2024, 10:14 AM

## 2024-05-02 DIAGNOSIS — H353132 Nonexudative age-related macular degeneration, bilateral, intermediate dry stage: Secondary | ICD-10-CM | POA: Diagnosis not present

## 2024-05-10 DIAGNOSIS — R208 Other disturbances of skin sensation: Secondary | ICD-10-CM | POA: Diagnosis not present

## 2024-05-24 ENCOUNTER — Ambulatory Visit

## 2024-05-25 DIAGNOSIS — E782 Mixed hyperlipidemia: Secondary | ICD-10-CM | POA: Diagnosis not present

## 2024-05-25 DIAGNOSIS — E1169 Type 2 diabetes mellitus with other specified complication: Secondary | ICD-10-CM | POA: Diagnosis not present

## 2024-05-25 DIAGNOSIS — I2510 Atherosclerotic heart disease of native coronary artery without angina pectoris: Secondary | ICD-10-CM | POA: Diagnosis not present

## 2024-05-25 DIAGNOSIS — Z01818 Encounter for other preprocedural examination: Secondary | ICD-10-CM | POA: Diagnosis not present

## 2024-05-25 LAB — LAB REPORT - SCANNED
A1c: 7.5
EGFR: 60

## 2024-05-30 ENCOUNTER — Ambulatory Visit: Payer: Medicare Other | Admitting: Oncology

## 2024-06-07 ENCOUNTER — Ambulatory Visit
Admission: RE | Admit: 2024-06-07 | Discharge: 2024-06-07 | Disposition: A | Source: Ambulatory Visit | Attending: Oncology | Admitting: Oncology

## 2024-06-07 DIAGNOSIS — Z1231 Encounter for screening mammogram for malignant neoplasm of breast: Secondary | ICD-10-CM | POA: Diagnosis not present

## 2024-06-07 DIAGNOSIS — C50412 Malignant neoplasm of upper-outer quadrant of left female breast: Secondary | ICD-10-CM

## 2024-06-13 ENCOUNTER — Other Ambulatory Visit: Payer: Self-pay | Admitting: Oncology

## 2024-06-13 ENCOUNTER — Encounter: Payer: Self-pay | Admitting: Oncology

## 2024-06-13 ENCOUNTER — Inpatient Hospital Stay: Attending: Oncology | Admitting: Oncology

## 2024-06-13 ENCOUNTER — Telehealth: Payer: Self-pay | Admitting: Oncology

## 2024-06-13 VITALS — BP 125/70 | HR 71 | Temp 97.9°F | Resp 16 | Ht 61.0 in | Wt 112.1 lb

## 2024-06-13 DIAGNOSIS — Z85820 Personal history of malignant melanoma of skin: Secondary | ICD-10-CM | POA: Diagnosis not present

## 2024-06-13 DIAGNOSIS — M8580 Other specified disorders of bone density and structure, unspecified site: Secondary | ICD-10-CM | POA: Diagnosis not present

## 2024-06-13 DIAGNOSIS — C50412 Malignant neoplasm of upper-outer quadrant of left female breast: Secondary | ICD-10-CM

## 2024-06-13 DIAGNOSIS — R0789 Other chest pain: Secondary | ICD-10-CM

## 2024-06-13 DIAGNOSIS — L639 Alopecia areata, unspecified: Secondary | ICD-10-CM | POA: Diagnosis not present

## 2024-06-13 DIAGNOSIS — D751 Secondary polycythemia: Secondary | ICD-10-CM | POA: Diagnosis not present

## 2024-06-13 DIAGNOSIS — Z1231 Encounter for screening mammogram for malignant neoplasm of breast: Secondary | ICD-10-CM

## 2024-06-13 DIAGNOSIS — Z853 Personal history of malignant neoplasm of breast: Secondary | ICD-10-CM | POA: Diagnosis not present

## 2024-06-13 NOTE — Telephone Encounter (Signed)
 Patient has been scheduled for follow-up visit per 06/13/2024 LOS.  Pt given an appt calendar with date and time.

## 2024-06-13 NOTE — Progress Notes (Signed)
 [facility]  [address] [place],  [place]  [address] [phone]  Clinic Day: 06/13/24  Referring physician: Fernand Tracey LABOR, MD   CHIEF COMPLAINT:  CC: History of stage 0 left breast cancer  Current Treatment:  Surveillance  HISTORY OF PRESENT ILLNESS:  Faith Gaines is a 78 y.o. female with a history of stage 0 left breast cancer diagnosed in November of 2014 and treated with a lumpectomy in February of 2015. 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  This measured 0.1 cm for a Tis N0 N0.  She was offered chemoprevention but declined because of many other comorbidities.  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  She does have osteopenia and her last bone density scan was done in January of 2015. She gets these done through her gynecologist in [place].  In April of 2016, she had a total abdominal hysterectomy and bilateral salpingo-oophorectomy.  She did have a iron deficiency anemia in the past, but this corrected.  She tells me that she met with her primary care physician in December of 2016, and she was anemic again with a hemoglobin down to 11.  She was found to have iron deficiency.  She did complain of occasional soreness of her tongue, dizziness, and craving for ice.  She was placed on slow release iron after she was not able to tolerate ferrous sulfate.  She was fatigued and weak with dyspnea on exertion, and had daily headaches as well.  She had severe bleeding from the rectum which she attributes to hemorrhoids. Studies still showed evidence of iron deficiency and so she was given 2 injections of Feraheme at the end of December  2016, 1 week apart.  She has had endoscopy and colonoscopy through Dr. Kristie in [place] several years ago, so she was referred back, and had these repeated in January of 2017.  EGD revealed diffuse moderate gastritis and colonoscopy  revealed hemorrhoids.  She was told to avoid all nonsteroidal anti-inflammatory medications.    She has been diagnosed with lichen planopilaris and is using a liquid steroid from the dermatologist in [place], and topical Rogaine.  They referred her to [facility], and they felt this was immune mediated and placed her on hydroxychloroquine 200 mg twice daily.  She feels this is helping some.  She had a biopsy in the past of her right upper arm by a dermatologist in [place], Dr. Tricia, and was told this was "pre melanoma".  She had a bone density scan 2 years ago, which apparently was not as good as previous.    She underwent triple bypass surgery in June 2021 and her hemoglobin was 9.0 postop.  They took veins from her left leg and left chest.  Since her surgery, she has had pain within that left breast and along the sternal incision.  When she was seen in September 2021, she was still having a lot of post-thoracotomy pain and so I placed her on a low dose of Elavil  25 mg.  She reported drowsiness.  Dr. Shyrl placed her on Lyrica  25 mg BID.  CT chest from January 2022 revealed mild emphysematous changes but no acute pulmonary findings or worrisome pulmonary lesions.  No mediastinal or hilar mass or adenopathy was observed.  I saw her in December 2022 and she was on gabapentin  100 mg twice daily without benefit.  I therefore  recommended a slow increase in the dose and she is now up to 300 mg twice daily with 3 pills at bedtime.  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 She has a history of breast cancer diagnosed in November, 2014. Patient states that she feels well but complains of fatigue, shortness of breath, and pain across the left chest radiating into her back and left axilla that has persisted for 1-2 months. She has also had pneumonia twice this year. She previously had a shingles flare in this  area over 15 years ago and has not had a flare since.  However, she states that this improves with Famvir but reoccurs when she stops. I will order a CT chest for further evaluation. She had a screening bilateral mammogram done on 06/07/2024 that was clear. She informed me that she will be having cataract surgery on December 2nd and 9th. The patient had labs done on 05/25/2024 and had a WBC of 9.9, elevated hemoglobin of 17.1, and platelet count of 229,000. Her CMP was normal other than an elevated calcium  of 19.7 and elevated A1c of 7.5. I will see her back in 3 months with CBC, CMP, and JAK2 mutation to see if her polycythemia is persistent and try to evaluate if this is primary or secondary. She has no history of lung disease or hypoxia. CT scan of abdomen reveals multiple bilateral renal cysts but no suspicious tumors. She will also be due to return in 1 year with bilateral screening mammogram.   She denies fever, chills, night sweats, or other signs of infection. She denies gastrointestinal issues. Her appetite is good and Her weight has decreased 1 pounds over last 2 months.  REVIEW OF SYSTEMS:  Review of Systems  Constitutional:  Positive for fatigue. Negative for appetite change, chills, diaphoresis, fever and unexpected weight change.  HENT:   Negative for hearing loss, lump/mass, mouth sores, nosebleeds, sore throat, tinnitus, trouble swallowing and voice change.   Eyes: Negative.  Negative for eye problems and icterus.  Respiratory:  Positive for shortness of breath. Negative for chest tightness, cough, hemoptysis and wheezing.   Cardiovascular: Negative.  Negative for chest pain, leg swelling and palpitations.  Gastrointestinal: Negative.  Negative for abdominal distention, abdominal pain, blood in stool, constipation, diarrhea, nausea, rectal pain and vomiting.  Endocrine: Negative.   Genitourinary: Negative.  Negative for bladder incontinence, difficulty urinating, dyspareunia, dysuria,  frequency, hematuria, menstrual problem, nocturia, pelvic pain, vaginal bleeding and vaginal discharge.   Musculoskeletal:  Positive for arthralgias (left shoulder), myalgias and neck pain. Negative for back pain, flank pain, gait problem and neck stiffness.       Pain across the left chest radiating into her back and left axilla  Skin: Negative.  Negative for itching, rash and wound.  Neurological:  Negative for dizziness, extremity weakness, gait problem, headaches, light-headedness, numbness, seizures and speech difficulty.  Hematological: Negative.  Negative for adenopathy. Does not bruise/bleed easily.  Psychiatric/Behavioral:  Positive for sleep disturbance. Negative for confusion, decreased concentration, depression and suicidal ideas. The patient is not nervous/anxious.    VITALS:  Blood pressure 125/70, pulse 71, temperature 97.9 F (36.6 C), temperature source Oral, resp. rate 16, height 5' 1 (1.549 m), weight 112 lb 1.6 oz (50.8 kg), SpO2 99%.  Wt Readings from Last 3 Encounters:  06/13/24 112 lb 1.6 oz (50.8 kg)  03/21/24 113 lb 4.8 oz (51.4 kg)  11/19/23 110 lb 3.2  oz (50 kg)    Body mass index is 21.18 kg/m.  Performance status (ECOG): 1 - Symptomatic but completely ambulatory  PHYSICAL EXAM:  Physical Exam Vitals and nursing note reviewed.  Constitutional:      General: She is not in acute distress.    Appearance: Normal appearance. She is normal weight. She is not ill-appearing, toxic-appearing or diaphoretic.  HENT:     Head: Normocephalic and atraumatic.     Right Ear: Tympanic membrane, ear canal and external ear normal. There is no impacted cerumen.     Left Ear: Tympanic membrane, ear canal and external ear normal. There is no impacted cerumen.     Nose: Nose normal. No congestion or rhinorrhea.     Mouth/Throat:     Mouth: Mucous membranes are moist.     Pharynx: Oropharynx is clear. No oropharyngeal exudate or posterior oropharyngeal erythema.  Eyes:      General: No scleral icterus.       Right eye: No discharge.        Left eye: No discharge.     Extraocular Movements: Extraocular movements intact.     Conjunctiva/sclera: Conjunctivae normal.     Pupils: Pupils are equal, round, and reactive to light.  Neck:     Vascular: No carotid bruit.     Comments: Well healed anterior neck scar Cardiovascular:     Rate and Rhythm: Normal rate and regular rhythm.     Pulses: Normal pulses.     Heart sounds: Normal heart sounds. No murmur heard.    No friction rub. No gallop.  Pulmonary:     Effort: Pulmonary effort is normal. No respiratory distress.     Breath sounds: Normal breath sounds. No stridor. No wheezing, rhonchi or rales.  Chest:     Chest wall: No tenderness.  Breasts:    Right: Normal.     Left: Normal.     Comments: Scar in the upper outer quadrant of the right breast which is well healed.  Moderate fibrocystic changes in the left breast which is very tender and extends into the left axilla.  Well healed mid sternal scar No masses in either breasts Abdominal:     General: Bowel sounds are normal. There is no distension.     Palpations: Abdomen is soft. There is no hepatomegaly, splenomegaly or mass.     Tenderness: There is no abdominal tenderness. There is no right CVA tenderness, left CVA tenderness, guarding or rebound.     Hernia: No hernia is present.  Musculoskeletal:        General: No swelling, tenderness, deformity or signs of injury. Normal range of motion.     Cervical back: Normal range of motion and neck supple. No rigidity or tenderness.     Right lower leg: No edema.     Left lower leg: No edema.  Lymphadenopathy:     Cervical: No cervical adenopathy.  Skin:    General: Skin is warm and dry.     Coloration: Skin is not jaundiced or pale.     Findings: No bruising, erythema, lesion or rash.  Neurological:     General: No focal deficit present.     Mental Status: She is alert and oriented to person, place,  and time. Mental status is at baseline.     Cranial Nerves: No cranial nerve deficit.     Sensory: No sensory deficit.     Motor: No weakness.     Coordination: Coordination normal.  Gait: Gait normal.     Deep Tendon Reflexes: Reflexes normal.  Psychiatric:        Mood and Affect: Mood normal.        Behavior: Behavior normal.        Thought Content: Thought content normal.        Judgment: Judgment normal.    LABS:      Latest Ref Rng & Units 01/04/2023   12:44 PM 09/05/2022    3:52 AM 09/04/2022   11:34 AM  CBC  WBC 4.0 - 10.5 K/uL 9.1  6.1    Hemoglobin 12.0 - 15.0 g/dL 86.6  87.7  84.6   Hematocrit 36.0 - 46.0 % 40.7  36.5  45.0   Platelets 150 - 400 K/uL 202  271        Latest Ref Rng & Units 01/04/2023   12:44 PM 09/07/2022    4:31 AM 09/06/2022    4:33 AM  CMP  Glucose 70 - 99 mg/dL 876  95  845   BUN 8 - 23 mg/dL 15  <5  5   Creatinine 0.44 - 1.00 mg/dL 9.12  9.39  9.36   Sodium 135 - 145 mmol/L 143  141  140   Potassium 3.5 - 5.1 mmol/L 4.1  3.6  3.0   Chloride 98 - 111 mmol/L 104  106  110   CO2 22 - 32 mmol/L 23  26  24    Calcium  8.9 - 10.3 mg/dL 89.6  9.7  9.6     Lab Results  Component Value Date   TIBC 372 05/29/2020   FERRITIN 99 05/29/2020   IRONPCTSAT 20 05/29/2020   Lab Results  Component Value Date   HGBA1C 7.8 (H) 08/20/2022    STUDIES:  EXAM: 06/07/2024 DIGITAL SCREENING BILATERAL MAMMOGRAM WITH TOMOSYNTHESIS AND CAD IMPRESSION: No mammographic evidence of malignancy. A result letter of this screening mammogram will be mailed directly to the patient.   Allergies:  Allergies  Allergen Reactions   Codeine Nausea And Vomiting, Nausea Only and Other (See Comments)    Headaches   Ace Inhibitors Other (See Comments)    Drop in GFR  Other Reaction(s): drop in GFR   Atorvastatin Other (See Comments)    Body aches  Other reaction(s): body aches   Clarithromycin Other (See Comments)    Insomnia  Other Reaction(s): unable to sleep    Iodine Other (See Comments)    Fungus  Other Reaction(s): fungus?   Plaquenil [Hydroxychloroquine Sulfate] Itching   Rosuvastatin Other (See Comments)    Myalgias   Avandamet [Rosiglitazone-Metformin ] Rash and Other (See Comments)    Hyperactivity    Betadine [Povidone Iodine] Rash   Other Rash and Dermatitis    Other reaction(s): body aches  Other reaction(s): rash    Current Medications: Current Outpatient Medications  Medication Sig Dispense Refill   gatifloxacin (ZYMAXID) 0.5 % SOLN Place 1 drop into the right eye 3 (three) times daily.     ketorolac  (ACULAR ) 0.5 % ophthalmic solution Place 1 drop into the right eye 2 (two) times daily.     acetaminophen  (TYLENOL ) 325 MG tablet Take 650 mg by mouth every 6 (six) hours as needed for mild pain, moderate pain, fever or headache.     aspirin  EC 81 MG tablet Take 81 mg by mouth daily.     Biotin 5000 MCG TABS Take 1,000 mcg by mouth daily.     Cholecalciferol (VITAMIN D3) 50 MCG (2000 UT) TABS Take 2,000  Units by mouth daily.     Dapagliflozin Propanediol (FARXIGA PO) Take 10 mg by mouth daily.     Evolocumab  (REPATHA  SURECLICK) 140 MG/ML SOAJ Inject 140 mg into the skin every 14 (fourteen) days. 6 mL 3   famotidine  (PEPCID ) 40 MG tablet Take 40 mg by mouth at bedtime.     gabapentin  (NEURONTIN ) 300 MG capsule Take 1 capsule (300 mg total) by mouth 3 (three) times daily. (Patient taking differently: Take 300 mg by mouth 4 (four) times daily.) 270 capsule 2   methocarbamol  (ROBAXIN ) 750 MG tablet Take 1 tablet (750 mg total) by mouth every 6 (six) hours as needed for muscle spasms. 60 tablet 0   Multiple Vitamins-Minerals (PRESERVISION AREDS 2 PO) Take 1 capsule by mouth daily.     nebivolol  (BYSTOLIC ) 5 MG tablet Take 5 mg by mouth daily.     Nebivolol  HCl (BYSTOLIC ) 20 MG TABS Take 20 mg by mouth daily.     Omega-3 Fatty Acids (FISH OIL) 1000 MG CAPS Take 1,000 mg by mouth daily.     ondansetron  (ZOFRAN ) 4 MG tablet Take 1 tablet (4  mg total) by mouth every 8 (eight) hours as needed for nausea or vomiting. 20 tablet 0   pantoprazole  (PROTONIX ) 40 MG tablet Take 40 mg by mouth daily.     promethazine  (PHENERGAN ) 25 MG tablet Take 25 mg by mouth every 6 (six) hours as needed for nausea or vomiting.     Semaglutide,0.25 or 0.5MG /DOS, (OZEMPIC, 0.25 OR 0.5 MG/DOSE,) 2 MG/1.5ML SOPN Inject 1 mg into the skin once a week.     vitamin B-12 (CYANOCOBALAMIN) 500 MCG tablet Take 500 mcg by mouth daily.     No current facility-administered medications for this visit.   ASSESSMENT & PLAN:  Assessment:  1. Stage 0 breast cancer November, 2014, treated with lumpectomy.  She has no evidence of disease.  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  3. Osteopenia. She is taking oral vitamin D .   4. History of malignant melanoma resected from her left leg at age 13. No evidence of disease.  5. Triple bypass surgery in June 2021.  She completed cardiac rehab.  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  She is now on gabapentin  at 300 mg four times daily with fair relief.  6.. Severe pain of the left upper extremity. She has had surgery on her cervical spine for 3 herniated discs.   7. Polycythemia. This is a new finding for her and so we will repeat it in 3 months to see if it is persistent. I will check a JAK2 for assessment to see if this is primary or secondary.   8. Pain of the left upper chest wall which seems in a dermatomal pattern. This could represent post-herpetic neuralgia or even pain radiating from her cervical spine which has worsened. However, with her history of dyspnea and prior breast cancer, I will obtain a CT chest to rule out pulmonary or nodal pathology.   Plan: She has had pneumonia twice this year. She previously had a shingles flare in this area over 15 years ago and has not had a flare since.  However, she states that this improves  with Famvir but reoccurs when she stops. I will order a CT chest for further evaluation. She had a screening bilateral mammogram done on 06/07/2024 that was clear. She informed me that she will be having cataract surgery on December 2nd  and 9th. The patient had labs done on 05/25/2024 and had a WBC of 9.9, elevated hemoglobin of 17.1, and platelet count of 229,000. Her CMP was normal other than an elevated calcium  of 19.7 and elevated A1c of 7.5. I will see her back in 3 months with CBC, CMP, and JAK2 mutation to see if her polycythemia is persistent and try to evaluate if this is primary or secondary. She has no history of lung disease or hypoxia. CT scan of abdomen reveals multiple bilateral renal cysts but no suspicious tumors. She will also be due to return in 1 year with bilateral screening mammogram. She understands and agrees with this plan of care.  I provided 31 minutes of face-to-face time during this this encounter and > 50% was spent counseling as documented under my assessment and plan.   Faith VEAR Cornish, MD  [facility] DE[facility] OF [facility] [address] [place] [address] Dept: [phone] Dept Fax: [phone]   No orders of the defined types were placed in this encounter.  I,Faith Gaines,acting as a scribe for Faith VEAR Cornish, MD.,have documented all relevant documentation on the behalf of Faith VEAR Cornish, MD,as directed by  Faith VEAR Cornish, MD while in the presence of Faith VEAR Cornish, MD.

## 2024-06-20 DIAGNOSIS — H2512 Age-related nuclear cataract, left eye: Secondary | ICD-10-CM | POA: Diagnosis not present

## 2024-06-20 DIAGNOSIS — H25042 Posterior subcapsular polar age-related cataract, left eye: Secondary | ICD-10-CM | POA: Diagnosis not present

## 2024-06-20 DIAGNOSIS — H25011 Cortical age-related cataract, right eye: Secondary | ICD-10-CM | POA: Diagnosis not present

## 2024-06-20 DIAGNOSIS — H25012 Cortical age-related cataract, left eye: Secondary | ICD-10-CM | POA: Diagnosis not present

## 2024-06-20 DIAGNOSIS — I10 Essential (primary) hypertension: Secondary | ICD-10-CM | POA: Diagnosis not present

## 2024-06-20 DIAGNOSIS — H2511 Age-related nuclear cataract, right eye: Secondary | ICD-10-CM | POA: Diagnosis not present

## 2024-06-27 DIAGNOSIS — H25012 Cortical age-related cataract, left eye: Secondary | ICD-10-CM | POA: Diagnosis not present

## 2024-06-27 DIAGNOSIS — H2512 Age-related nuclear cataract, left eye: Secondary | ICD-10-CM | POA: Diagnosis not present

## 2024-06-27 DIAGNOSIS — I10 Essential (primary) hypertension: Secondary | ICD-10-CM | POA: Diagnosis not present

## 2024-06-29 ENCOUNTER — Inpatient Hospital Stay (INDEPENDENT_AMBULATORY_CARE_PROVIDER_SITE_OTHER): Admission: RE | Admit: 2024-06-29 | Discharge: 2024-06-29 | Attending: Oncology | Admitting: Oncology

## 2024-06-29 DIAGNOSIS — R0789 Other chest pain: Secondary | ICD-10-CM | POA: Diagnosis not present

## 2024-06-29 LAB — I-STAT CREATININE (MANUAL ENTRY): Creatinine, Ser: 1.1 (ref 0.50–1.10)

## 2024-06-29 MED ORDER — IOHEXOL 300 MG/ML  SOLN
80.0000 mL | Freq: Once | INTRAMUSCULAR | Status: AC | PRN
  Administered 2024-06-29: 80 mL via INTRAVENOUS

## 2024-07-07 ENCOUNTER — Telehealth: Payer: Self-pay

## 2024-07-07 NOTE — Telephone Encounter (Signed)
-----   Message from Wanda Cornish, MD sent at 07/04/2024  6:06 PM EST ----- Regarding: call Tell her CT chest is clear, just see scar tissue

## 2024-07-07 NOTE — Telephone Encounter (Signed)
Called patient and left message on VM

## 2024-09-12 ENCOUNTER — Inpatient Hospital Stay

## 2024-09-12 ENCOUNTER — Inpatient Hospital Stay: Admitting: Oncology

## 2024-09-13 ENCOUNTER — Inpatient Hospital Stay

## 2024-09-13 ENCOUNTER — Inpatient Hospital Stay: Admitting: Oncology

## 2025-06-13 ENCOUNTER — Inpatient Hospital Stay

## 2025-06-13 ENCOUNTER — Inpatient Hospital Stay: Admitting: Oncology
